# Patient Record
Sex: Female | Born: 1950 | Race: White | Hispanic: No | Marital: Married | Smoking: Former smoker
Health system: Southern US, Community
[De-identification: ages and names within clinical notes are randomized; demographics above are authoritative.]

## PROBLEM LIST (undated history)

## (undated) DIAGNOSIS — R202 Paresthesia of skin: Secondary | ICD-10-CM

## (undated) DIAGNOSIS — F32A Depression, unspecified: Secondary | ICD-10-CM

## (undated) DIAGNOSIS — H9190 Unspecified hearing loss, unspecified ear: Secondary | ICD-10-CM

## (undated) DIAGNOSIS — H9319 Tinnitus, unspecified ear: Secondary | ICD-10-CM

## (undated) DIAGNOSIS — H905 Unspecified sensorineural hearing loss: Secondary | ICD-10-CM

## (undated) DIAGNOSIS — E739 Lactose intolerance, unspecified: Secondary | ICD-10-CM

## (undated) DIAGNOSIS — H1010 Acute atopic conjunctivitis, unspecified eye: Secondary | ICD-10-CM

## (undated) DIAGNOSIS — R519 Headache, unspecified: Secondary | ICD-10-CM

## (undated) DIAGNOSIS — T7840XA Allergy, unspecified, initial encounter: Secondary | ICD-10-CM

## (undated) DIAGNOSIS — E039 Hypothyroidism, unspecified: Secondary | ICD-10-CM

## (undated) DIAGNOSIS — R7303 Prediabetes: Secondary | ICD-10-CM

## (undated) DIAGNOSIS — G43909 Migraine, unspecified, not intractable, without status migrainosus: Secondary | ICD-10-CM

## (undated) DIAGNOSIS — R51 Headache: Secondary | ICD-10-CM

## (undated) DIAGNOSIS — K219 Gastro-esophageal reflux disease without esophagitis: Secondary | ICD-10-CM

## (undated) DIAGNOSIS — C801 Malignant (primary) neoplasm, unspecified: Secondary | ICD-10-CM

## (undated) DIAGNOSIS — M8580 Other specified disorders of bone density and structure, unspecified site: Secondary | ICD-10-CM

## (undated) DIAGNOSIS — K760 Fatty (change of) liver, not elsewhere classified: Secondary | ICD-10-CM

## (undated) DIAGNOSIS — J309 Allergic rhinitis, unspecified: Secondary | ICD-10-CM

## (undated) DIAGNOSIS — N390 Urinary tract infection, site not specified: Secondary | ICD-10-CM

## (undated) DIAGNOSIS — M549 Dorsalgia, unspecified: Secondary | ICD-10-CM

## (undated) DIAGNOSIS — H2010 Chronic iridocyclitis, unspecified eye: Secondary | ICD-10-CM

## (undated) DIAGNOSIS — C19 Malignant neoplasm of rectosigmoid junction: Secondary | ICD-10-CM

## (undated) DIAGNOSIS — Z8719 Personal history of other diseases of the digestive system: Secondary | ICD-10-CM

## (undated) DIAGNOSIS — M1990 Unspecified osteoarthritis, unspecified site: Secondary | ICD-10-CM

## (undated) HISTORY — PX: ABDOMINAL HYSTERECTOMY: SHX81

## (undated) HISTORY — DX: Allergic rhinitis, unspecified: H10.10

## (undated) HISTORY — PX: ELBOW SURGERY: SHX618

## (undated) HISTORY — PX: GLAUCOMA SURGERY: SHX656

## (undated) HISTORY — DX: Dorsalgia, unspecified: M54.9

## (undated) HISTORY — PX: SPINE SURGERY: SHX786

## (undated) HISTORY — DX: Malignant neoplasm of rectosigmoid junction: C19

## (undated) HISTORY — PX: WRIST SURGERY: SHX841

## (undated) HISTORY — DX: Tinnitus, unspecified ear: H93.19

## (undated) HISTORY — DX: Headache: R51

## (undated) HISTORY — DX: Unspecified hearing loss, unspecified ear: H91.90

## (undated) HISTORY — DX: Allergy, unspecified, initial encounter: T78.40XA

## (undated) HISTORY — DX: Paresthesia of skin: R20.2

## (undated) HISTORY — PX: OTHER SURGICAL HISTORY: SHX169

## (undated) HISTORY — DX: Allergic rhinitis, unspecified: J30.9

## (undated) HISTORY — PX: HERNIA REPAIR: SHX51

## (undated) HISTORY — DX: Fatty (change of) liver, not elsewhere classified: K76.0

## (undated) HISTORY — DX: Lactose intolerance, unspecified: E73.9

## (undated) HISTORY — PX: LUNG REMOVAL, PARTIAL: SHX233

## (undated) HISTORY — PX: GASTRIC BYPASS: SHX52

## (undated) HISTORY — DX: Other specified disorders of bone density and structure, unspecified site: M85.80

## (undated) HISTORY — DX: Migraine, unspecified, not intractable, without status migrainosus: G43.909

## (undated) HISTORY — DX: Unspecified sensorineural hearing loss: H90.5

## (undated) HISTORY — PX: EYE SURGERY: SHX253

## (undated) HISTORY — DX: Headache, unspecified: R51.9

## (undated) HISTORY — PX: CHOLECYSTECTOMY: SHX55

## (undated) HISTORY — PX: DIAGNOSTIC LAPAROSCOPY: SUR761

## (undated) HISTORY — DX: Chronic iridocyclitis, unspecified eye: H20.10

## (undated) HISTORY — DX: Prediabetes: R73.03

## (undated) HISTORY — PX: CATARACT EXTRACTION: SUR2

## (undated) HISTORY — DX: Urinary tract infection, site not specified: N39.0

## (undated) HISTORY — DX: Gastro-esophageal reflux disease without esophagitis: K21.9

---
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
## 2013-12-11 ENCOUNTER — Ambulatory Visit (INDEPENDENT_AMBULATORY_CARE_PROVIDER_SITE_OTHER): Admitting: Neurology

## 2013-12-11 ENCOUNTER — Encounter: Payer: Self-pay | Admitting: Neurology

## 2013-12-11 VITALS — BP 143/69 | HR 61 | Ht 66.0 in | Wt 200.0 lb

## 2013-12-11 DIAGNOSIS — R32 Unspecified urinary incontinence: Secondary | ICD-10-CM

## 2013-12-11 DIAGNOSIS — R519 Headache, unspecified: Secondary | ICD-10-CM | POA: Insufficient documentation

## 2013-12-11 DIAGNOSIS — R51 Headache: Secondary | ICD-10-CM

## 2013-12-11 DIAGNOSIS — R200 Anesthesia of skin: Secondary | ICD-10-CM

## 2013-12-11 DIAGNOSIS — G43909 Migraine, unspecified, not intractable, without status migrainosus: Secondary | ICD-10-CM

## 2013-12-11 MED ORDER — RIZATRIPTAN BENZOATE 5 MG PO TBDP: 5.0000 mg | ORAL_TABLET | ORAL | Status: DC | PRN

## 2013-12-11 MED ORDER — TOPIRAMATE 100 MG PO TABS: 100.0000 mg | ORAL_TABLET | Freq: Two times a day (BID) | ORAL | Status: DC

## 2013-12-11 NOTE — Progress Notes (Signed)
PATIENT: Jordan Mahoney DOB: 06/06/1950  HISTORICAL  Jordan Mahoney is 63 years old right-handed Caucasian female, accompanied by her husband, referred to primary care physician Dr. Kieth Mahoney, and ENT Dr. Redmond Mahoney for evaluation of frequent headaches  She reported long history of headaches since teenager, it was typical migraine, caudal cranial severe pounding headaches, with associated light noise sensitivity, nauseous, can lasting few hours to days, over the years, she has been dealing with her migraine to variable degree, previously tried Imitrex, which does help her migraine, but causing palpitation  She reported increased migraine since 2014, she had about once every months severe prolonged typical migraine headaches, in between, she also has almost daily low-grade pressure headache behind her eyes, with associated light noise smell sensitivity.  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  She also had a history of gastric bypass in 2010, initially with 100 pound loss, now dealing with her frequent headaches, left elbow pain, recent surgery for tendinitis, she has regained 40 pounds,  She report 2 month history of urinary incontinence, since August 2015, she also noticed bilateral fingertips paresthesia, but denies significant neck pain, denies significant gait difficulty   Laboratory evaluation in June 2015 showed normal CBC, ferritin 28.2, LDL 88, normal iron level,  TSH 2.2 9, vitamin D 37.9, B12 570   REVIEW OF SYSTEMS: Full 14 system review of systems performed and notable only for urinary incontinence, eye pain, swelling in legs, hearing loss, ringing ears, easy bruising, allergies,  ALLERGIES: Allergies  Allergen Reactions  . Latex   . Valium [Diazepam]     HOME MEDICATIONS: No current outpatient prescriptions on file prior to visit.   No current facility-administered medications on file prior  to visit.    PAST MEDICAL HISTORY: Past Medical History  Diagnosis Date  . HA (headache)     PAST SURGICAL HISTORY: Past Surgical History  Procedure Laterality Date  . Cataract extraction Right   . C sections    . Belly button    . Wrist surgery Right   . Elbow surgery Left     FAMILY HISTORY: Family History  Problem Relation Age of Onset  . Diabetes Father     SOCIAL HISTORY:  History   Social History  . Marital Status: Unknown    Spouse Name: N/A    Number of Children: 2  . Years of Education: college   Occupational History  .      retired   Social History Main Topics  . Smoking status: Former Research scientist (life sciences)  . Smokeless tobacco: Never Used     Comment: Quit in 1985  . Alcohol Use: 0.6 oz/week    1 Cans of beer per week     Comment: Rare   . Drug Use: No  . Sexual Activity: Not on file   Other Topics Concern  . Not on file   Social History Narrative   Patient lives at home with her husband Jordan Mahoney).    Education two years of college.   Caffeine two glasses of tea.     PHYSICAL EXAM   Filed Vitals:   12/11/13 0836  BP: 143/69  Pulse: 61  Height: 5\' 6"  (1.676 m)  Weight: 200 lb (90.719 kg)    Not recorded    Body mass index is 32.3 kg/(m^2).   Generalized: In no acute distress  Neck: Supple, no carotid bruits   Cardiac: Regular rate rhythm  Pulmonary:  Clear to auscultation bilaterally  Musculoskeletal: No deformity  Neurological examination  Mentation: Alert oriented to time, place, history taking, and causual conversation  Cranial nerve II-XII: Right was enlarged reactive to light. Extraocular movements were full.  Visual field were full on confrontational test. Bilateral fundi were sharp.  Facial sensation and strength were normal. Hearing was intact to finger rubbing bilaterally. Uvula tongue midline.  Head turning and shoulder shrug and were normal and symmetric.Tongue protrusion into cheek strength was normal.  Motor: Normal tone,  bulk and strength, except limited range of motion of left elbow,  Sensory: Intact to fine touch, pinprick, preserved vibratory sensation, and proprioception at toes.  Coordination: Normal finger to nose, heel-to-shin bilaterally there was no truncal ataxia  Gait: Rising up from seated position without assistance, normal stance, without trunk ataxia, moderate stride, good arm swing, smooth turning, able to perform tiptoe, and heel walking , moderate difficulty with tandem walking   Romberg signs: Negative  Deep tendon reflexes: Brachioradialis 2/2, biceps 2/2, triceps 2/2, patellar  3/3 , Achilles 2/2, plantar responses were extensor bilaterally   DIAGNOSTIC DATA (LABS, IMAGING, TESTING) - I reviewed patient records, labs, notes, testing and imaging myself where available.   ASSESSMENT AND PLAN  Jordan Mahoney is a 63 y.o. female complains of  frequent headaches, long-standing history of migraines, she also reported two-month history of urinary urgency, incontinence, hyperreflexia of both upper and lower extremities, bilateral Babinski signs,  1, for her migraine headaches, will initiate preventive medications Topamax, titrating to 100 mg twice a day, 2. Maxalt as needed 3. Her history and examination suggestive of cervical spondylitic myelopathy, will proceed with MRI of cervical spine   Jordan Mahoney M.D. Ph.D.  [facility] [address], [place], [place] [address] [phone]

## 2013-12-21 ENCOUNTER — Ambulatory Visit
Admission: RE | Admit: 2013-12-21 | Discharge: 2013-12-21 | Disposition: A | Discharge status: Home / Self Care | Source: Ambulatory Visit | Attending: Neurology | Admitting: Neurology

## 2013-12-21 DIAGNOSIS — G43909 Migraine, unspecified, not intractable, without status migrainosus: Secondary | ICD-10-CM

## 2013-12-21 DIAGNOSIS — R202 Paresthesia of skin: Secondary | ICD-10-CM

## 2013-12-21 DIAGNOSIS — R32 Unspecified urinary incontinence: Secondary | ICD-10-CM

## 2013-12-21 DIAGNOSIS — R200 Anesthesia of skin: Secondary | ICD-10-CM

## 2013-12-26 ENCOUNTER — Ambulatory Visit (INDEPENDENT_AMBULATORY_CARE_PROVIDER_SITE_OTHER): Admitting: Neurology

## 2013-12-26 ENCOUNTER — Encounter: Payer: Self-pay | Admitting: Neurology

## 2013-12-26 VITALS — BP 120/83 | HR 68 | Ht 66.0 in | Wt 195.0 lb

## 2013-12-26 DIAGNOSIS — R200 Anesthesia of skin: Secondary | ICD-10-CM

## 2013-12-26 DIAGNOSIS — G43909 Migraine, unspecified, not intractable, without status migrainosus: Secondary | ICD-10-CM

## 2013-12-26 DIAGNOSIS — R32 Unspecified urinary incontinence: Secondary | ICD-10-CM

## 2013-12-26 NOTE — Progress Notes (Signed)
PATIENT: Jordan Mahoney DOB: 1950-07-27  HISTORICAL  Jordan Mahoney is 64 years old right-handed Caucasian female, accompanied by her husband, referred to primary care physician Dr. Kieth Brightly, and ENT Dr. Redmond Pulling for evaluation of frequent headaches  She reported long history of headaches since teenager, it was typical migraine, caudal cranial severe pounding headaches, with associated light noise sensitivity, nauseous, can lasting few hours to days, over the years, she has been dealing with her migraine to variable degree, previously tried Imitrex, which does help her migraine, but causing palpitation  She reported increased migraine since 2014, she had about once every months severe prolonged typical migraine headaches, in between, she also has almost daily low-grade pressure headache behind her eyes, with associated light noise smell sensitivity.  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  She also had a history of gastric bypass in 2010, initially with 100 pound loss, now dealing with her frequent headaches, left elbow pain, recent surgery for tendinitis, she has regained 40 pounds,  She report 2 month history of urinary incontinence, since August 2015, she also noticed bilateral fingertips paresthesia, but denies significant neck pain, denies significant gait difficulty   Laboratory evaluation in June 2015 showed normal CBC, ferritin 28.2, LDL 88, normal iron level,  TSH 2.2 9, vitamin D 37.9, B12 570  UPDATE Dec 26 2013: She has tired Maxalt 5 mg, which has been helfpful, but she also has to take second dosage because headaches comes back a few hours later, she also complains of fatigue, jaw stenosis after medications, she is on higher dose of Topamax 100 mg twice a day, complains of fingertips, toes numbness, fatigue,  Her headache overall has much improved, she has. In between, she is headache free, she  denies gait difficulty, no significant neck pain,  But she complains of acute onset urinary urgency, occasionally incontinence, Vesicare has been helpful, now is on generic oxybutynin, which is Lasix active  We have reviewed MRI of the cervical spine, which has demonstrated multilevel degenerative disc disease, most severe at C5-6, C6 and 7, with mild-to-moderate canal stenosis, there was no cord signal change  REVIEW OF SYSTEMS: Full 14 system review of systems performed and notable only for above ALLERGIES: Allergies  Allergen Reactions  . Latex   . Valium [Diazepam]     HOME MEDICATIONS: Current Outpatient Prescriptions on File Prior to Visit  Medication Sig Dispense Refill  . azelastine (ASTELIN) 0.1 % nasal spray Place 1 spray into both nostrils 2 (two) times daily. Use in each nostril as directed    . Biotin 2500 MCG CAPS Take by mouth daily.    . calcium carbonate (TUMS - DOSED IN MG ELEMENTAL CALCIUM) 500 MG chewable tablet Chew 1 tablet by mouth 3 (three) times daily.    . cetirizine (ZYRTEC) 10 MG tablet Take 10 mg by mouth daily.    [name] CRANBERRY PO Take by mouth 2 (two) times daily.    . Ergocalciferol (VITAMIN D2) 2000 UNITS TABS Take by mouth daily.    [name] esomeprazole (NEXIUM) 20 MG capsule Take 20 mg by mouth 2 (two) times daily before a meal.    . mometasone (NASONEX) 50 MCG/ACT nasal spray Place 2 sprays into the nose daily.    . Multiple Vitamin (MULTIVITAMIN) tablet Take 1 tablet by mouth daily.    [name] POTASSIUM CITRATE PO Take by mouth daily.    . rizatriptan (MAXALT-MLT) 5 MG disintegrating  tablet Take 1 tablet (5 mg total) by mouth as needed for migraine. May repeat in 2 hours if needed 15 tablet 11  . topiramate (TOPAMAX) 100 MG tablet Take 1 tablet (100 mg total) by mouth 2 (two) times daily. 60 tablet 11  . vitamin C (ASCORBIC ACID) 500 MG tablet Take 500 mg by mouth 2 (two) times daily.     No current facility-administered medications on file prior to visit.     PAST MEDICAL HISTORY: Past Medical History  Diagnosis Date  . HA (headache)     PAST SURGICAL HISTORY: Past Surgical History  Procedure Laterality Date  . Cataract extraction Right   . C sections    . Belly button    . Wrist surgery Right   . Elbow surgery Left     FAMILY HISTORY: Family History  Problem Relation Age of Onset  . Diabetes Father     SOCIAL HISTORY:  History   Social History  . Marital Status: Unknown    Spouse Name: N/A    Number of Children: 2  . Years of Education: college   Occupational History  .      retired   Social History Main Topics  . Smoking status: Former Research scientist (life sciences)  . Smokeless tobacco: Never Used     Comment: Quit in 1985  . Alcohol Use: 0.6 oz/week    1 Cans of beer per week     Comment: Rare   . Drug Use: No  . Sexual Activity: Not on file   Other Topics Concern  . Not on file   Social History Narrative   Patient lives at home with her husband Jordan Mahoney).    Education two years of college.   Caffeine two glasses of tea.     PHYSICAL EXAM   Filed Vitals:   12/26/13 0947  BP: 120/83  Pulse: 68  Height: 5\' 6"  (1.676 m)  Weight: 195 lb (88.451 kg)    Not recorded      Body mass index is 31.49 kg/(m^2).   Generalized: In no acute distress  Neck: Supple, no carotid bruits   Cardiac: Regular rate rhythm  Pulmonary: Clear to auscultation bilaterally  Musculoskeletal: No deformity  Neurological examination  Mentation: Alert oriented to time, place, history taking, and causual conversation  Cranial nerve II-XII: Right pupil was 1 mm enlarged in comparison to the left pupil, reactive to light. Extraocular movements were full.  Visual field were full on confrontational test. Bilateral fundi were sharp.  Facial sensation and strength were normal. Hearing was intact to finger rubbing bilaterally. Uvula tongue midline.  Head turning and shoulder shrug and were normal and symmetric.Tongue protrusion into cheek  strength was normal.  Motor: Normal tone, bulk and strength, except limited range of motion of left elbow,  Sensory: Intact to fine touch, pinprick, preserved vibratory sensation, and proprioception at toes.  Coordination: Normal finger to nose, heel-to-shin bilaterally there was no truncal ataxia  Gait: Rising up from seated position without assistance, normal stance, without trunk ataxia, moderate stride, good arm swing, smooth turning, able to perform tiptoe, and heel walking , moderate difficulty with tandem walking   Romberg signs: Negative  Deep tendon reflexes: Brachioradialis 2/2, biceps 2/2, triceps 2/2, patellar  3/3 , Achilles 2/2, plantar responses were flexor bilaterally   DIAGNOSTIC DATA (LABS, IMAGING, TESTING) - I reviewed patient records, labs, notes, testing and imaging myself where available.   ASSESSMENT AND PLAN  Hibo Blasdell is a 63 y.o. female complains  of  frequent headaches, long-standing history of migraines, she also reported two-month history of urinary urgency, incontinence, hyperreflexia of both upper and lower extremities, bilateral Babinski signs,  1,continue preventive medications Topamax  100 mg twice a day,may also consider magnesium oxide, 400 mg, riboflavin 100 mg twice a day 2. Maxalt as needed 3.  Cervical degenerative disc disease, mild to moderate canal stenosis, But there was no cord signal change,no gait difficulty, she does has urinary urgency, occasionally incontinence, has much improved with Vesicare/oxybutynin treatment, continue current management, 4.  return to clinic in 3 months,  Marcial Pacas M.D. Ph.D.  [facility] [address] [place], [place] [address] [phone]

## 2013-12-26 NOTE — Patient Instructions (Signed)
Magnesium oxide 400mg  Riboflavin 100mg  twice a day

## 2014-01-15 ENCOUNTER — Telehealth: Payer: Self-pay | Admitting: Neurology

## 2014-01-15 MED ORDER — RIZATRIPTAN BENZOATE 5 MG PO TBDP: 5.0000 mg | ORAL_TABLET | ORAL | Status: DC | PRN

## 2014-01-15 MED ORDER — TOPIRAMATE 100 MG PO TABS: 100.0000 mg | ORAL_TABLET | Freq: Two times a day (BID) | ORAL | Status: DC

## 2014-01-15 NOTE — Telephone Encounter (Signed)
Patient requesting Rx refills 90 day supply for  topiramate (TOPAMAX) 100 MG tablet and rizatriptan (MAXALT-MLT) 5 MG disintegrating tablet sent to [facility].  Please call and advise.

## 2014-01-15 NOTE — Telephone Encounter (Signed)
Rx's have been sent.  I called back, got no answer.  Left message.

## 2014-02-22 ENCOUNTER — Telehealth: Payer: Self-pay | Admitting: *Deleted

## 2014-02-22 NOTE — Telephone Encounter (Signed)
Records and MRI sent to Dr. Glenna Fellows.

## 2014-03-27 ENCOUNTER — Ambulatory Visit (INDEPENDENT_AMBULATORY_CARE_PROVIDER_SITE_OTHER): Admitting: Neurology

## 2014-03-27 ENCOUNTER — Encounter: Payer: Self-pay | Admitting: Neurology

## 2014-03-27 VITALS — BP 146/86 | HR 78 | Ht 66.0 in | Wt 188.0 lb

## 2014-03-27 DIAGNOSIS — G43909 Migraine, unspecified, not intractable, without status migrainosus: Secondary | ICD-10-CM | POA: Insufficient documentation

## 2014-03-27 MED ORDER — RIZATRIPTAN BENZOATE 10 MG PO TBDP: 10.0000 mg | ORAL_TABLET | ORAL | Status: DC | PRN

## 2014-03-27 MED ORDER — TOPIRAMATE 100 MG PO TABS: 100.0000 mg | ORAL_TABLET | Freq: Two times a day (BID) | ORAL | Status: DC

## 2014-03-27 NOTE — Progress Notes (Signed)
PATIENT: Jordan Mahoney DOB: [birth year]  HISTORICAL  Jordan Mahoney is 64 years old right-handed Caucasian female, accompanied by her husband, referred to primary care physician Dr. Kieth Brightly, and ENT Dr. Redmond Pulling for evaluation of frequent headaches  She reported long history of headaches since teenager, it was typical migraine, holocranial severe pounding headaches, with associated light noise sensitivity, nauseous, can last few hours to days, over the years, she has been dealing with her migraine to variable degree, previously tried Imitrex, which does help her migraine, but causing palpitation  She reported increased migraine since 2014, she had about once every month severe prolonged typical migraine headaches, in between, she also has almost daily low-grade pressure headache behind her eyes, with associated light noise smell sensitivity.  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  She also had a history of gastric bypass in 2010, initially with 100 pound loss, now dealing with her frequent headaches, left elbow pain, recent surgery for tendinitis, she has regained 40 pounds,  She report 2 month history of urinary incontinence, since August 2015, she also noticed bilateral fingertips paresthesia, but denies significant neck pain, denies significant gait difficulty  Laboratory evaluation in June 2015 showed normal CBC, ferritin 28.2, LDL 88, normal iron level,  TSH 2.2 9, vitamin D 37.9, B12 570  UPDATE Dec 26 2013: She has tired Maxalt 5 mg, which has been helfpful, but she also has to take second dosage because headaches comes back a few hours later, she also complains of fatigue, jaw tightness after medications, she is on higher dose of Topamax 100 mg twice a day, complains of fingertips, toes numbness, fatigue,  Her headache overall has much improved, she has. In between, she is headache free, she denies  gait difficulty, no significant neck pain,  But she complains of acute onset urinary urgency, occasionally incontinence, Vesicare has been helpful, now is on generic oxybutynin,   We have reviewed MRI of the cervical spine, which has demonstrated multilevel degenerative disc disease, most severe at C5-6, C6 and 7, with mild-to-moderate canal stenosis, there was no cord signal change  UPDATE Feb 10th 2016: She is now tolerating Topamax 100mg  bid, still has mild fingertips paresthesia, she is also taking magnesium, and riboflavin, her headache is under much better control, couple times each months, she had her typical migraine headaches, Maxalta 5mg  prn, was helpful, but often has to take second dosage, trigger for her migraines are weather change, strong smell, certain food, wines, processed food,  REVIEW OF SYSTEMS: Full 14 system review of systems performed and notable only for above ALLERGIES: Allergies  Allergen Reactions  . Latex   . Valium [Diazepam]     HOME MEDICATIONS: Current Outpatient Prescriptions on File Prior to Visit  Medication Sig Dispense Refill  . azelastine (ASTELIN) 0.1 % nasal spray Place 1 spray into both nostrils 2 (two) times daily. Use in each nostril as directed    . Biotin 2500 MCG CAPS Take by mouth daily.    . calcium carbonate (TUMS - DOSED IN MG ELEMENTAL CALCIUM) 500 MG chewable tablet Chew 1 tablet by mouth 3 (three) times daily.    . cetirizine (ZYRTEC) 10 MG tablet Take 10 mg by mouth daily.    [name] CRANBERRY PO Take by mouth 2 (two) times daily.    . Ergocalciferol (VITAMIN D2) 2000 UNITS TABS Take by mouth daily.    [name] esomeprazole (NEXIUM) 20 MG capsule Take  20 mg by mouth 2 (two) times daily before a meal.    . mometasone (NASONEX) 50 MCG/ACT nasal spray Place 2 sprays into the nose daily.    . Multiple Vitamin (MULTIVITAMIN) tablet Take 1 tablet by mouth daily.    [name] POTASSIUM CITRATE PO Take by mouth daily.    . rizatriptan (MAXALT-MLT) 5 MG  disintegrating tablet Take 1 tablet (5 mg total) by mouth as needed for migraine. May repeat in 2 hours if needed 36 tablet 3  . topiramate (TOPAMAX) 100 MG tablet Take 1 tablet (100 mg total) by mouth 2 (two) times daily. 180 tablet 3  . vitamin C (ASCORBIC ACID) 500 MG tablet Take 500 mg by mouth 2 (two) times daily.     No current facility-administered medications on file prior to visit.    PAST MEDICAL HISTORY: Past Medical History  Diagnosis Date  . HA (headache)     PAST SURGICAL HISTORY: Past Surgical History  Procedure Laterality Date  . Cataract extraction Right   . C sections    . Belly button    . Wrist surgery Right   . Elbow surgery Left     FAMILY HISTORY: Family History  Problem Relation Age of Onset  . Diabetes Father     SOCIAL HISTORY:  History   Social History  . Marital Status: Unknown    Spouse Name: N/A    Number of Children: 2  . Years of Education: college   Occupational History  .      retired   Social History Main Topics  . Smoking status: Former Research scientist (life sciences)  . Smokeless tobacco: Never Used     Comment: Quit in 1985  . Alcohol Use: 0.6 oz/week    1 Cans of beer per week     Comment: Rare   . Drug Use: No  . Sexual Activity: Not on file   Other Topics Concern  . Not on file   Social History Narrative   Patient lives at home with her husband [name]).    Education two years of college.   Caffeine two glasses of tea.     PHYSICAL EXAM   Filed Vitals:   03/27/14 1043  BP: 146/86  Pulse: 78  Height: 5\' 6"  (1.676 m)  Weight: 188 lb (85.276 kg)    Not recorded      Body mass index is 30.36 kg/(m^2).   Generalized: In no acute distress  Neck: Supple, no carotid bruits   Cardiac: Regular rate rhythm  Pulmonary: Clear to auscultation bilaterally  Musculoskeletal: No deformity  Neurological examination  Mentation: Alert oriented to time, place, history taking, and causual conversation  Cranial nerve II-XII: Right  pupil was 1 mm enlarged in comparison to the left pupil, reactive to light. Extraocular movements were full.  Visual field were full on confrontational test. Bilateral fundi were sharp.  Facial sensation and strength were normal. Hearing was intact to finger rubbing bilaterally. Uvula tongue midline.  Head turning and shoulder shrug and were normal and symmetric.Tongue protrusion into cheek strength was normal.  Motor: Normal tone, bulk and strength, except limited range of motion of left elbow,  Sensory: Intact to fine touch, pinprick, preserved vibratory sensation, and proprioception at toes.  Coordination: Normal finger to nose, heel-to-shin bilaterally there was no truncal ataxia  Gait: Rising up from seated position without assistance, normal stance, without trunk ataxia, moderate stride, good arm swing, smooth turning, able to perform tiptoe, and heel walking , moderate difficulty with  tandem walking   Romberg signs: Negative  Deep tendon reflexes: Brachioradialis 2/2, biceps 2/2, triceps 2/2, patellar  3/3 , Achilles 2/2, plantar responses were flexor bilaterally   DIAGNOSTIC DATA (LABS, IMAGING, TESTING) - I reviewed patient records, labs, notes, testing and imaging myself where available.   ASSESSMENT AND PLAN  Shardae Kleinman is a 64 y.o. female complains of  frequent headaches, long-standing history of migraines, she also reported two-month history of urinary urgency, incontinence, hyperreflexia of both upper and lower extremities, bilateral Babinski signs,  1,continue preventive medications Topamax  100 mg twice a day, and  magnesium oxide, 400 mg, riboflavin 100 mg twice a day 2. Maxalt 10 mg as needed 3.  Cervical degenerative disc disease, mild to moderate canal stenosis, But there was no cord signal change,no gait difficulty, she does has urinary urgency, occasionally incontinence, has much improved with Vesicare/oxybutynin treatment, continue current management, 4.  return  to clinic in 6 months, with Rhae Hammock M.D. Ph.D.  [facility] [address] [place], [place] [address] [phone]

## 2014-09-25 ENCOUNTER — Encounter: Payer: Self-pay | Admitting: Nurse Practitioner

## 2014-09-25 ENCOUNTER — Ambulatory Visit (INDEPENDENT_AMBULATORY_CARE_PROVIDER_SITE_OTHER): Admitting: Nurse Practitioner

## 2014-09-25 VITALS — BP 123/66 | HR 61 | Ht 66.0 in | Wt 183.0 lb

## 2014-09-25 DIAGNOSIS — G43909 Migraine, unspecified, not intractable, without status migrainosus: Secondary | ICD-10-CM

## 2014-09-25 NOTE — Progress Notes (Signed)
[facility]  PATIENT: Jordan Mahoney DOB: 1950/08/15   REASON FOR VISIT: Follow-up for severe migraine HISTORY FROM: Patient and husband    HISTORY OF PRESENT ILLNESS:Jordan Mahoney is 64 years old right-handed Caucasian female, accompanied by her husband, referred to primary care physician Dr. Kieth Brightly, and ENT Dr. Redmond Pulling for evaluation of frequent headaches She reported long history of headaches since teenager, it was typical migraine, holocranial severe pounding headaches, with associated light noise sensitivity, nauseous, can last few hours to days, over the years, she has been dealing with her migraine to variable degree, previously tried Imitrex, which does help her migraine, but causing palpitation She reported increased migraine since 2014, she had about once every month severe prolonged typical migraine headaches, in between, she also has almost daily low-grade pressure headache behind her eyes, with associated light noise smell sensitivity. 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 She also had a history of gastric bypass in 2010, initially with 100 pound loss, now dealing with her frequent headaches, left elbow pain, recent surgery for tendinitis, she has regained 40 pounds, She report 2 month history of urinary incontinence, since August 2015, she also noticed bilateral fingertips paresthesia, but denies significant neck pain, denies significant gait difficulty Laboratory evaluation in June 2015 showed normal CBC, ferritin 28.2, LDL 88, normal iron level, TSH 2.2 9, vitamin D 37.9, B12 570  UPDATE Dec 26 2013: She has tired Maxalt 5 mg, which has been helfpful, but she also has to take second dosage because headaches comes back a few hours later, she also complains of fatigue, jaw tightness after medications, she is on higher dose of Topamax 100 mg twice a day, complains of fingertips,  toes numbness, fatigue, Her headache overall has much improved, she has. In between, she is headache free, she denies gait difficulty, no significant neck pain, But she complains of acute onset urinary urgency, occasionally incontinence, Vesicare has been helpful, now is on generic oxybutynin,  We have reviewed MRI of the cervical spine, which has demonstrated multilevel degenerative disc disease, most severe at C5-6, C6 and 7, with mild-to-moderate canal stenosis, there was no cord signal change  UPDATE Feb 10th 2016: She is now tolerating Topamax '100mg'$  bid, still has mild fingertips paresthesia, she is also taking magnesium, and riboflavin, her headache are  under much better control, couple times each months, she had her typical migraine headaches, Maxalta '5mg'$  prn, was helpful, but often has to take second dosage, trigger for her migraines are weather change, strong smell, certain food, wines, processed food,  UPDATE 09/25/2014: Jordan Mahoney, 64 year old female returns for follow-up. She has a history of migraines and her migraines have worsened in the last couple of weeks due to changes in the weather she claims. Her headache today is a 10 on the pain scale of 1-10. She is nauseated. She has photophobia. She remains on her Topamax as preventive. She did not take her Maxalt this morning. She returns for reevaluation and to get some relief    REVIEW OF SYSTEMS: Full 14 system review of systems performed and notable only for those listed, all others are neg:  Constitutional: neg  Cardiovascular: neg Ear/Nose/Throat: neg  Skin: neg Eyes: neg Respiratory: neg Gastroitestinal: neg  Hematology/Lymphatic: neg  Endocrine: neg Musculoskeletal:neg Allergy/Immunology: neg Neurological: Migraine today, dizziness Psychiatric: neg Sleep : neg   ALLERGIES: Allergies  Allergen Reactions  . Latex   . Sulfa Antibiotics Other (  See Comments)    Causes skin reaction ( on her mid back)  Pigmentation.     . Valium [Diazepam]     HOME MEDICATIONS: Outpatient Prescriptions Prior to Visit  Medication Sig Dispense Refill  . azelastine (ASTELIN) 0.1 % nasal spray Place 1 spray into both nostrils daily. Use in each nostril as directed    . Biotin 2500 MCG CAPS Take by mouth daily.    . calcium carbonate (TUMS - DOSED IN MG ELEMENTAL CALCIUM) 500 MG chewable tablet Chew 1 tablet by mouth 3 (three) times daily.    . cetirizine (ZYRTEC) 10 MG tablet Take 10 mg by mouth daily.    . Ergocalciferol (VITAMIN D2) 2000 UNITS TABS Take by mouth daily.    [name] esomeprazole (NEXIUM) 20 MG capsule Take 20 mg by mouth 2 (two) times daily before a meal.    . magnesium oxide (MAG-OX) 400 MG tablet Take 400 mg by mouth daily.    . mometasone (NASONEX) 50 MCG/ACT nasal spray Place 2 sprays into the nose daily.    . Multiple Vitamin (MULTIVITAMIN) tablet Take 1 tablet by mouth daily.    [name] POTASSIUM CITRATE PO Take by mouth daily.    . Riboflavin 100 MG CAPS Take 100 mg by mouth 2 (two) times daily.    . rizatriptan (MAXALT-MLT) 10 MG disintegrating tablet Take 1 tablet (10 mg total) by mouth as needed for migraine. May repeat in 2 hours if needed 12 tablet 11  . topiramate (TOPAMAX) 100 MG tablet Take 1 tablet (100 mg total) by mouth 2 (two) times daily. 180 tablet 3  . vitamin C (ASCORBIC ACID) 500 MG tablet Take 500 mg by mouth 2 (two) times daily.    [name] CRANBERRY PO Take by mouth 2 (two) times daily.     No facility-administered medications prior to visit.    PAST MEDICAL HISTORY: Past Medical History  Diagnosis Date  . HA (headache)     PAST SURGICAL HISTORY: Past Surgical History  Procedure Laterality Date  . Cataract extraction Right   . C sections    . Belly button    . Wrist surgery Right   . Elbow surgery Left     FAMILY HISTORY: Family History  Problem Relation Age of Onset  . Diabetes Father     SOCIAL HISTORY: Social History   Social History  . Marital Status: Unknown    Spouse  Name: N/A  . Number of Children: 2  . Years of Education: college   Occupational History  .      retired   Social History Main Topics  . Smoking status: Former Research scientist (life sciences)  . Smokeless tobacco: Never Used     Comment: Quit in 1985  . Alcohol Use: 0.6 oz/week    1 Cans of beer per week     Comment: Rare   . Drug Use: No  . Sexual Activity: Not on file   Other Topics Concern  . Not on file   Social History Narrative   Patient lives at home with her husband [name]).    Education two years of college.   Caffeine two glasses of tea.     PHYSICAL EXAM  Filed Vitals:   09/25/14 0945  BP: 123/66  Pulse: 61  Height: '5\' 6"'$  (1.676 m)  Weight: 183 lb (83.008 kg)   Body mass index is 29.55 kg/(m^2). Generalized: In no acute distress Neck: Supple, no carotid bruits  Cardiac: Regular rate rhythm Musculoskeletal: No deformity  Neurological examination Mentation: Alert oriented to time, place, history taking, and causual conversation  Cranial nerve II-XII: Fundiscopic deferred due to extreme migraine today.  Extraocular movements were full. Visual field were full on confrontational test.  Facial sensation and strength were normal. Hearing was intact to finger rubbing bilaterally. Uvula tongue midline. Head turning and shoulder shrug and were normal and symmetric.Tongue protrusion into cheek strength was normal. Motor: Normal tone, bulk and strength, except limited range of motion of left elbow, Sensory: Intact to fine touch, pinprick, preserved vibratory sensation, and proprioception at toes. Coordination: Normal finger to nose, heel-to-shin bilaterally there was no truncal ataxia Gait: Deferred due to migraine  Deep tendon reflexes: Brachioradialis 2/2, biceps 2/2, triceps 2/2, patellar 3/3 , Achilles 2/2, plantar responses were flexor bilaterally  DIAGNOSTIC DATA (LABS, IMAGING, TESTING) - ASSESSMENT AND PLAN  64 y.o. year old female  has a past medical history  of frequent  headaches, long-standing history of migraines, she also reported 29-monthhistory of urinary urgency, incontinence, hyperreflexia of both upper and lower extremities, bilateral Babinski signs,here  to follow up.   Discussed with Dr YKrista BlueFor her headache today will get migraine cocktail of IV Depacon 1 gram, Solu-Medrol '500mg'$   Compazine '10mg'$   and Toradol '30mg'$   For prevention continue Topamax at current doses does not need refills Patient to check with insurance for alternative triptan currently using Maxalt Keep a record of headaches  And bring to next appointment in 3 months NDennie Bible [facility] APRN  Late entry headache down to a 1 from a 10 after migraine cocktail.  [facility] [address] [place] [phone]

## 2014-09-25 NOTE — Patient Instructions (Addendum)
For her headache today will get migraine cocktail of Depacon Solu-Medrol Compazine and Toradol For prevention continue Topamax at current doses does not need refills Patient to check with insurance for alternative triptan currently using Maxalt  Keep a record of headaches  And bring to next appointment in 3 months

## 2014-09-25 NOTE — Progress Notes (Signed)
I have reviewed and agreed above plan. 

## 2014-09-27 ENCOUNTER — Telehealth: Payer: Self-pay | Admitting: Nurse Practitioner

## 2014-09-27 NOTE — Telephone Encounter (Signed)
Patient's husband is calling regarding alternative Triptan that the insurance will cover. He states Zolmitriptan and Relpax will be covered by the patient's insurance. Please call Rx to [facility]. Thank you.

## 2014-09-27 NOTE — Telephone Encounter (Signed)
Patient would like to change Triptans from Maxalt to either Zolmitriptan or Relpax for ins reason.  Dr Orion Crook are both out of the office.  Forwarding request to [facility] for review.

## 2014-09-29 MED ORDER — ELETRIPTAN HYDROBROMIDE 40 MG PO TABS: 40.0000 mg | ORAL_TABLET | ORAL | Status: DC | PRN

## 2014-09-29 NOTE — Telephone Encounter (Signed)
I will call in a prescription for the Relpax.

## 2014-11-07 DIAGNOSIS — J309 Allergic rhinitis, unspecified: Secondary | ICD-10-CM | POA: Insufficient documentation

## 2014-11-19 ENCOUNTER — Ambulatory Visit (INDEPENDENT_AMBULATORY_CARE_PROVIDER_SITE_OTHER)

## 2014-11-19 DIAGNOSIS — J301 Allergic rhinitis due to pollen: Secondary | ICD-10-CM | POA: Diagnosis not present

## 2014-11-26 ENCOUNTER — Encounter

## 2014-11-26 ENCOUNTER — Ambulatory Visit (INDEPENDENT_AMBULATORY_CARE_PROVIDER_SITE_OTHER)

## 2014-11-26 DIAGNOSIS — J309 Allergic rhinitis, unspecified: Secondary | ICD-10-CM | POA: Diagnosis not present

## 2014-11-26 NOTE — Progress Notes (Signed)
  This encounter was created in error - please disregard. Signed in under [place], patient seen in [place].

## 2014-12-03 ENCOUNTER — Ambulatory Visit (INDEPENDENT_AMBULATORY_CARE_PROVIDER_SITE_OTHER)

## 2014-12-03 DIAGNOSIS — J301 Allergic rhinitis due to pollen: Secondary | ICD-10-CM | POA: Diagnosis not present

## 2014-12-10 ENCOUNTER — Ambulatory Visit (INDEPENDENT_AMBULATORY_CARE_PROVIDER_SITE_OTHER)

## 2014-12-10 DIAGNOSIS — J301 Allergic rhinitis due to pollen: Secondary | ICD-10-CM | POA: Diagnosis not present

## 2014-12-17 ENCOUNTER — Ambulatory Visit (INDEPENDENT_AMBULATORY_CARE_PROVIDER_SITE_OTHER)

## 2014-12-17 DIAGNOSIS — J301 Allergic rhinitis due to pollen: Secondary | ICD-10-CM | POA: Diagnosis not present

## 2014-12-24 ENCOUNTER — Ambulatory Visit (INDEPENDENT_AMBULATORY_CARE_PROVIDER_SITE_OTHER)

## 2014-12-24 DIAGNOSIS — J301 Allergic rhinitis due to pollen: Secondary | ICD-10-CM | POA: Diagnosis not present

## 2014-12-26 ENCOUNTER — Ambulatory Visit (INDEPENDENT_AMBULATORY_CARE_PROVIDER_SITE_OTHER): Admitting: Nurse Practitioner

## 2014-12-26 ENCOUNTER — Encounter: Payer: Self-pay | Admitting: Nurse Practitioner

## 2014-12-26 VITALS — BP 136/80 | HR 61 | Ht 66.0 in | Wt 181.2 lb

## 2014-12-26 DIAGNOSIS — R51 Headache: Secondary | ICD-10-CM | POA: Diagnosis not present

## 2014-12-26 DIAGNOSIS — G43909 Migraine, unspecified, not intractable, without status migrainosus: Secondary | ICD-10-CM | POA: Diagnosis not present

## 2014-12-26 DIAGNOSIS — R519 Headache, unspecified: Secondary | ICD-10-CM

## 2014-12-26 NOTE — Patient Instructions (Addendum)
Continue Relpax acutely does not need refills Continue Topamax 100 mg twice daily Continue to  avoid migraine triggers Follow-up in 6-8 months

## 2014-12-26 NOTE — Progress Notes (Signed)
[facility]  PATIENT: Jordan Mahoney DOB: 02-17-1950   REASON FOR VISIT: Follow-up for migraines HISTORY FROM: Patient    HISTORY OF PRESENT ILLNESS:Jordan Mahoney is 64 years old right-handed Caucasian female, accompanied by her husband, referred to primary care physician Dr. Kieth Brightly, and ENT Dr. Redmond Pulling for evaluation of frequent headaches She reported long history of headaches since teenager, it was typical migraine, holocranial severe pounding headaches, with associated light noise sensitivity, nauseous, can last few hours to days, over the years, she has been dealing with her migraine to variable degree, previously tried Imitrex, which does help her migraine, but causing palpitation She reported increased migraine since 2014, she had about once every month severe prolonged typical migraine headaches, in between, she also has almost daily low-grade pressure headache behind her eyes, with associated light noise smell sensitivity. 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 She also had a history of gastric bypass in 2010, initially with 100 pound loss, now dealing with her frequent headaches, left elbow pain, recent surgery for tendinitis, she has regained 40 pounds, She report 2 month history of urinary incontinence, since August 2015, she also noticed bilateral fingertips paresthesia, but denies significant neck pain, denies significant gait difficulty Laboratory evaluation in June 2015 showed normal CBC, ferritin 28.2, LDL 88, normal iron level, TSH 2.2 9, vitamin D 37.9, B12 570  UPDATE Dec 26 2013: She has tired Maxalt 5 mg, which has been helfpful, but she also has to take second dosage because headaches comes back a few hours later, she also complains of fatigue, jaw tightness after medications, she is on higher dose of Topamax 100 mg twice a day, complains of fingertips, toes numbness,  fatigue, Her headache overall has much improved, she has. In between, she is headache free, she denies gait difficulty, no significant neck pain, But she complains of acute onset urinary urgency, occasionally incontinence, Vesicare has been helpful, now is on generic oxybutynin,  We have reviewed MRI of the cervical spine, which has demonstrated multilevel degenerative disc disease, most severe at C5-6, C6 and 7, with mild-to-moderate canal stenosis, there was no cord signal change  UPDATE Feb 10th 2016: She is now tolerating Topamax '100mg'$  bid, still has mild fingertips paresthesia, she is also taking magnesium, and riboflavin, her headache are under much better control, couple times each months, she had her typical migraine headaches, Maxalta '5mg'$  prn, was helpful, but often has to take second dosage, trigger for her migraines are weather change, strong smell, certain food, wines, processed food,  UPDATE 09/25/2014: [name], 64 year old female returns for follow-up. She has a history of migraines and her migraines have worsened in the last couple of weeks due to changes in the weather she claims. Her headache today is a 10 on the pain scale of 1-10. She is nauseated. She has photophobia. She remains on her Topamax as preventive. She did not take her Maxalt this morning. She returns for reevaluation UPDATE 12/26/2014 [name], 64year-old female returns for follow-up. She has a history of migraine headaches. She is currently on Topamax 100 twice daily and her triptan has been changed to Relpax,  works acutely for her. Her headaches are in good control. Her biggest triggers are weather changes, certain foods and strong perfumes. She tries to avoid these if at all possible. She returns for reevaluation    REVIEW OF SYSTEMS: Full 14 system review of systems performed and notable only  for those listed, all others are neg:  Constitutional: neg  Cardiovascular: neg Ear/Nose/Throat: neg  Skin:  neg Eyes: neg Respiratory: neg Gastroitestinal: neg  Hematology/Lymphatic: neg  Endocrine: neg Musculoskeletal:neg Allergy/Immunology: neg Neurological: neg Psychiatric: neg Sleep : neg   ALLERGIES: Allergies  Allergen Reactions  . Latex   . Sulfa Antibiotics Other (See Comments)    Causes skin reaction ( on her mid back)  Pigmentation.   . Valium [Diazepam]     HOME MEDICATIONS: Outpatient Prescriptions Prior to Visit  Medication Sig Dispense Refill  . azelastine (ASTELIN) 0.1 % nasal spray Place 1 spray into both nostrils daily. Use in each nostril as directed    . azelastine (OPTIVAR) 0.05 % ophthalmic solution 1 drop 2 (two) times daily.    . Biotin 2500 MCG CAPS Take by mouth daily.    . calcium carbonate (TUMS - DOSED IN MG ELEMENTAL CALCIUM) 500 MG chewable tablet Chew 1 tablet by mouth 3 (three) times daily.    [name] CALCIUM CITRATE PO Take 500 mg by mouth 3 (three) times daily.    . cetirizine (ZYRTEC) 10 MG tablet Take 10 mg by mouth daily.    [name] CRANBERRY PO Take 8,400 mg by mouth 2 (two) times daily.    [name] eletriptan (RELPAX) 40 MG tablet Take 1 tablet (40 mg total) by mouth as needed for migraine or headache. May repeat in 2 hours if headache persists or recurs. 27 tablet 1  . EPINEPHrine (EPIPEN 2-PAK) 0.3 mg/0.3 mL IJ SOAJ injection Inject 0.3 mg into the muscle as needed (for severe life-threatening allergic reaction).    . Ergocalciferol (VITAMIN D2) 2000 UNITS TABS Take by mouth daily.    [name] esomeprazole (NEXIUM) 20 MG capsule Take 20 mg by mouth 2 (two) times daily before a meal.    . Magnesium 400 MG CAPS Take 400 mg by mouth daily.    . metroNIDAZOLE (METROGEL) 0.75 % vaginal gel Place 1 Applicatorful vaginally daily.    . mometasone (NASONEX) 50 MCG/ACT nasal spray Place 2 sprays into the nose 2 (two) times daily.    . Multiple Vitamin (MULTIVITAMIN) tablet Take 1 tablet by mouth daily.    . nitrofurantoin (MACRODANTIN) 50 MG capsule Take 50 mg by mouth. Takes  on Monday, Wednesday, and Friday.    [name] oxyCODONE-acetaminophen (PERCOCET/ROXICET) 5-325 MG per tablet     . Riboflavin 100 MG CAPS Take 100 mg by mouth 2 (two) times daily.    . solifenacin (VESICARE) 10 MG tablet Take 10 mg by mouth daily.    [name] topiramate (TOPAMAX) 100 MG tablet Take 1 tablet (100 mg total) by mouth 2 (two) times daily. 180 tablet 3  . vitamin C (ASCORBIC ACID) 500 MG tablet Take 500 mg by mouth 2 (two) times daily.    [name] ZOSTAVAX 80998 UNT/0.65ML injection     . Riboflavin (VITAMIN B-2 PO) Take 100 mg by mouth daily.    . rizatriptan (MAXALT) 10 MG tablet Take 10 mg by mouth as needed for migraine. May repeat in 2 hours if needed    . VESICARE 10 MG tablet     . azelastine (ASTELIN) 0.1 % nasal spray Place 2 sprays into both nostrils 2 (two) times daily. Use in each nostril as directed    . magnesium oxide (MAG-OX) 400 MG tablet Take 400 mg by mouth daily.    . mometasone (NASONEX) 50 MCG/ACT nasal spray Place 2 sprays into the nose daily.    . nitrofurantoin (MACRODANTIN)  50 MG capsule      No facility-administered medications prior to visit.    PAST MEDICAL HISTORY: Past Medical History  Diagnosis Date  . HA (headache)     PAST SURGICAL HISTORY: Past Surgical History  Procedure Laterality Date  . Cataract extraction Right   . C sections    . Belly button    . Wrist surgery Right   . Elbow surgery Left     FAMILY HISTORY: Family History  Problem Relation Age of Onset  . Diabetes Father     SOCIAL HISTORY: Social History   Social History  . Marital Status: Unknown    Spouse Name: N/A  . Number of Children: 2  . Years of Education: college   Occupational History  .      retired   Social History Main Topics  . Smoking status: Former Research scientist (life sciences)  . Smokeless tobacco: Never Used     Comment: Quit in 1985  . Alcohol Use: 0.6 oz/week    1 Cans of beer per week     Comment: Rare   . Drug Use: No  . Sexual Activity: Not on file   Other Topics  Concern  . Not on file   Social History Narrative   Patient lives at home with her husband [name]).    Education two years of college.   Caffeine two glasses of tea.     PHYSICAL EXAM  Filed Vitals:   12/26/14 0922  BP: 136/80  Pulse: 61  Height: '5\' 6"'$  (1.676 m)  Weight: 181 lb 3.2 oz (82.192 kg)   Body mass index is 29.26 kg/(m^2). Generalized: In no acute distress Neck: Supple, no carotid bruits  Cardiac: Regular rate rhythm Musculoskeletal: No deformity  Neurological examination Mentation: Alert oriented to time, place, history taking, and causual conversation  Cranial nerve II-XII: PERL. Extraocular movements were full. Visual field were full on confrontational test.  Facial sensation and strength were normal. Hearing was intact to finger rubbing bilaterally. Uvula tongue midline. Head turning and shoulder shrug and were normal and symmetric.Tongue protrusion into cheek strength was normal. Motor: Normal tone, bulk and strength, except limited range of motion of left elbow, Sensory: Intact to fine touch, pinprick, preserved vibratory sensation, and proprioception at toes. Coordination: Normal finger to nose, heel-to-shin bilaterally there was no truncal ataxia Gait: Ambulated short distance, can heel toe and tandem without difficulty Deep tendon reflexes: Brachioradialis 2/2, biceps 2/2, triceps 2/2, patellar 3/3 , Achilles 2/2, plantar responses were flexor bilaterally   DIAGNOSTIC DATA (LABS, IMAGING, TESTING) -  ASSESSMENT AND PLAN  64 y.o. year old female  has a past medical history of HA (headache). here to follow-up.  PLANContinue Relpax acutely does not need refills Continue Topamax 100 mg twice daily Continue to avoid migraine triggers Follow-up in 6-8 months Dennie Bible, [facility], [facility], APRN  [facility] [address] [place], [place] [address] [phone]

## 2014-12-26 NOTE — Progress Notes (Signed)
I have reviewed and agreed above plan. 

## 2014-12-31 ENCOUNTER — Ambulatory Visit (INDEPENDENT_AMBULATORY_CARE_PROVIDER_SITE_OTHER)

## 2014-12-31 DIAGNOSIS — J301 Allergic rhinitis due to pollen: Secondary | ICD-10-CM | POA: Diagnosis not present

## 2015-01-06 ENCOUNTER — Telehealth: Payer: Self-pay | Admitting: Nurse Practitioner

## 2015-01-06 MED ORDER — ELETRIPTAN HYDROBROMIDE 40 MG PO TABS: 40.0000 mg | ORAL_TABLET | ORAL | Status: DC | PRN

## 2015-01-06 NOTE — Telephone Encounter (Signed)
Spoke to pt.  She has been taking relpax 1-2 tabs daily for migraines.    I told her that the topamax is daily preventative medication for her migraines.  The relpax is to use acutely (when has migraine).  This as per instructed when in last 12-26-14.  She states that her migraines are triggered by bariatric weather changes, chocolate, flashing lights.  I told her that insurance dictates how much of this medication we can prescribe at a time (per month).  She got this via mail order (#27 tabs).  I told her that this medication is not to be used with other triptans.  I would forward message to CM/NP.  Call her cell#.   She only had 4 tabs left.    (not sure when get this last prescription).

## 2015-01-06 NOTE — Telephone Encounter (Signed)
Patient called to request refill of eletriptan (RELPAX) 40 MG tablet, states she is taking this medication most every day and wonders if medication quantity can be increased.

## 2015-01-06 NOTE — Telephone Encounter (Signed)
She needs refills on Relpax last refilled in August. Will refill.

## 2015-01-07 ENCOUNTER — Ambulatory Visit (INDEPENDENT_AMBULATORY_CARE_PROVIDER_SITE_OTHER)

## 2015-01-07 DIAGNOSIS — J309 Allergic rhinitis, unspecified: Secondary | ICD-10-CM | POA: Diagnosis not present

## 2015-01-14 ENCOUNTER — Ambulatory Visit (INDEPENDENT_AMBULATORY_CARE_PROVIDER_SITE_OTHER)

## 2015-01-14 DIAGNOSIS — J309 Allergic rhinitis, unspecified: Secondary | ICD-10-CM

## 2015-01-21 ENCOUNTER — Other Ambulatory Visit: Payer: Self-pay

## 2015-01-21 ENCOUNTER — Ambulatory Visit (INDEPENDENT_AMBULATORY_CARE_PROVIDER_SITE_OTHER)

## 2015-01-21 DIAGNOSIS — J309 Allergic rhinitis, unspecified: Secondary | ICD-10-CM

## 2015-01-21 MED ORDER — CETIRIZINE HCL 10 MG PO TABS: ORAL_TABLET | ORAL | Status: DC

## 2015-01-27 ENCOUNTER — Other Ambulatory Visit: Payer: Self-pay | Admitting: Allergy and Immunology

## 2015-01-28 ENCOUNTER — Ambulatory Visit (INDEPENDENT_AMBULATORY_CARE_PROVIDER_SITE_OTHER)

## 2015-01-28 DIAGNOSIS — J309 Allergic rhinitis, unspecified: Secondary | ICD-10-CM

## 2015-02-04 ENCOUNTER — Ambulatory Visit (INDEPENDENT_AMBULATORY_CARE_PROVIDER_SITE_OTHER)

## 2015-02-04 DIAGNOSIS — J309 Allergic rhinitis, unspecified: Secondary | ICD-10-CM

## 2015-02-11 ENCOUNTER — Ambulatory Visit (INDEPENDENT_AMBULATORY_CARE_PROVIDER_SITE_OTHER)

## 2015-02-11 DIAGNOSIS — J309 Allergic rhinitis, unspecified: Secondary | ICD-10-CM | POA: Diagnosis not present

## 2015-02-18 ENCOUNTER — Ambulatory Visit (INDEPENDENT_AMBULATORY_CARE_PROVIDER_SITE_OTHER): Admitting: Neurology

## 2015-02-18 DIAGNOSIS — J309 Allergic rhinitis, unspecified: Secondary | ICD-10-CM | POA: Diagnosis not present

## 2015-02-25 ENCOUNTER — Ambulatory Visit (INDEPENDENT_AMBULATORY_CARE_PROVIDER_SITE_OTHER): Admitting: Neurology

## 2015-02-25 DIAGNOSIS — J309 Allergic rhinitis, unspecified: Secondary | ICD-10-CM

## 2015-03-04 ENCOUNTER — Encounter: Payer: Self-pay | Admitting: Allergy and Immunology

## 2015-03-04 ENCOUNTER — Ambulatory Visit (INDEPENDENT_AMBULATORY_CARE_PROVIDER_SITE_OTHER): Admitting: Allergy and Immunology

## 2015-03-04 VITALS — BP 126/78 | HR 64 | Temp 98.2°F | Resp 20

## 2015-03-04 DIAGNOSIS — R05 Cough: Secondary | ICD-10-CM

## 2015-03-04 DIAGNOSIS — R059 Cough, unspecified: Secondary | ICD-10-CM

## 2015-03-04 DIAGNOSIS — J309 Allergic rhinitis, unspecified: Secondary | ICD-10-CM

## 2015-03-04 DIAGNOSIS — H1010 Acute atopic conjunctivitis, unspecified eye: Secondary | ICD-10-CM

## 2015-03-04 NOTE — Progress Notes (Signed)
FOLLOW UP NOTE  RE: Kathee Tumlin MRN: [id number] DOB: [birth year] [facility] OF [place]. [address] [place], [place] [address] Date of Office Visit: 03/04/2015  Subjective:  Nickolette Espinola is a 65 y.o. female who presents today for Cough; Nasal Congestion; and Headache  Assessment:   1. Allergic rhinoconjunctivitis.   2. Cough, likely viral upper respiratory infection, afebrile in no respiratory distress.    3.      Complex medical history. Plan:   Patient Instructions  1.  Hold immunotherapy today. 2.  Restart Zyrtec 10 mg once daily. 3.  May use Sudafed 30 mg once twice daily as needed over the next several days. 4.  Saline nasal wash 2-4 times daily. 5.  For the next several days, restart Astelin 1-2 sprays twice daily and then Nasonex as previously. 6.  If persisting symptoms, call with update next week, before restarting immunotherapy 7.  Next week restart immunotherapy at 0.2 cc for 4 occasions, then advance as long as tolerated. 8.  EpiPen, Benadryl as needed. 9.  Follow-up in July or sooner if needed.  HPI:  Eloni returns to the office regarding allergic rhinoconjunctivitis on immunotherapy with last visit in our office in July. She reports there have been a few red, tiny slightly hive-like areas with allergy injections, therefore, dose decreased, but no large local or systemic reactions.  She is pleased with her regime.  Overall describes she has been doing well except a recent respiratory illness in the last week with nasal congestion, sneezing, or tickling postnasal drip, and throat clearing cough.  She notices her hearing is slightly muffled with ear pressure, but clearly remember sick contacts.  She thought her nasal passages were irritated too much to use the nose spray(Nasonex and Astelin) after having a nosebleed at the beginning of this illness. She is improving and feels sleep, activity and appetite are normal.  Denies fever, difficulty  breathing or shortness of breath or other associated concerns.  Her other physicians continue to manage her migraines, now improved and she is anticipating a neck operation.  Denies ED or urgent care visits, prednisone or antibiotic courses. Reports sleep and activity are normal.  Current Medications: 1.  Zyrtec 10 mg daily. 2.  Optivar, EpiPen, Benadryl as needed. 3.  Continues vitamin D, Nexium, calcium, biotin, magnesium, multivitamin, Macrodantin, vitamin C and Vesicare. 4.  As needed Relpax, Percocet and MetroGel.  Drug Allergies: Allergies  Allergen Reactions  . Latex   . Sulfa Antibiotics Other (See Comments)    Causes skin reaction ( on her mid back)  Pigmentation.   . Valium [Diazepam]    Objective:   Filed Vitals:   03/04/15 0923  BP: 126/78  Pulse: 64  Temp: 98.2 F (36.8 C)  Resp: 20   Physical Exam  Constitutional: She is well-developed, well-nourished, and in no distress.  Alert interactive communicating easily in full sentences with slightly nasal voice.  HENT:  Head: Atraumatic.  Right Ear: Tympanic membrane and ear canal normal.  Left Ear: Tympanic membrane and ear canal normal.  Nose: Mucosal edema (slight pallor) and rhinorrhea (clear mucus) present. No epistaxis.  Mouth/Throat: Oropharynx is clear and moist and mucous membranes are normal. No oropharyngeal exudate, posterior oropharyngeal edema or posterior oropharyngeal erythema.  Neck: Neck supple.  Cardiovascular: Normal rate, S1 normal and S2 normal.   No murmur heard. Pulmonary/Chest: Effort normal. She has no wheezes. She has no rhonchi. She has no rales.  Lymphadenopathy:  She has no cervical adenopathy.   Diagnostics: Spirometry:  FVC  2.92--102%, FEV1 2.80--116%.    Renel Ende M. Ishmael Holter, MD  cc: No primary care provider on file.

## 2015-03-04 NOTE — Patient Instructions (Signed)
  Hold immunotherapy today.  Restart Zyrtec 10 mg once daily.  May use Sudafed 30 mg once twice daily as needed over the next several days.  Saline nasal wash 2-4 times daily.  In the next several days, restart Astelin 1-2 sprays twice daily and then Nasonex as previously.  If persisting symptoms, call with update next week.  Next week restart immunotherapy at 0.2 cc for 4 occasions, then advance as long as tolerated.  EpiPen, Benadryl as needed.  Follow-up in July or sooner if needed.

## 2015-03-11 ENCOUNTER — Ambulatory Visit (INDEPENDENT_AMBULATORY_CARE_PROVIDER_SITE_OTHER)

## 2015-03-11 DIAGNOSIS — J309 Allergic rhinitis, unspecified: Secondary | ICD-10-CM

## 2015-03-25 ENCOUNTER — Ambulatory Visit (INDEPENDENT_AMBULATORY_CARE_PROVIDER_SITE_OTHER)

## 2015-03-25 DIAGNOSIS — J309 Allergic rhinitis, unspecified: Secondary | ICD-10-CM | POA: Diagnosis not present

## 2015-03-27 DIAGNOSIS — J3089 Other allergic rhinitis: Secondary | ICD-10-CM | POA: Diagnosis not present

## 2015-04-01 ENCOUNTER — Ambulatory Visit (INDEPENDENT_AMBULATORY_CARE_PROVIDER_SITE_OTHER)

## 2015-04-01 DIAGNOSIS — J309 Allergic rhinitis, unspecified: Secondary | ICD-10-CM | POA: Diagnosis not present

## 2015-04-08 ENCOUNTER — Ambulatory Visit (INDEPENDENT_AMBULATORY_CARE_PROVIDER_SITE_OTHER)

## 2015-04-08 DIAGNOSIS — J309 Allergic rhinitis, unspecified: Secondary | ICD-10-CM | POA: Diagnosis not present

## 2015-04-22 ENCOUNTER — Ambulatory Visit (INDEPENDENT_AMBULATORY_CARE_PROVIDER_SITE_OTHER)

## 2015-04-22 DIAGNOSIS — J309 Allergic rhinitis, unspecified: Secondary | ICD-10-CM | POA: Diagnosis not present

## 2015-04-29 ENCOUNTER — Ambulatory Visit (INDEPENDENT_AMBULATORY_CARE_PROVIDER_SITE_OTHER)

## 2015-04-29 DIAGNOSIS — J309 Allergic rhinitis, unspecified: Secondary | ICD-10-CM | POA: Diagnosis not present

## 2015-05-11 ENCOUNTER — Other Ambulatory Visit: Payer: Self-pay | Admitting: Neurology

## 2015-05-13 ENCOUNTER — Ambulatory Visit (INDEPENDENT_AMBULATORY_CARE_PROVIDER_SITE_OTHER)

## 2015-05-13 DIAGNOSIS — J309 Allergic rhinitis, unspecified: Secondary | ICD-10-CM | POA: Diagnosis not present

## 2015-05-20 ENCOUNTER — Ambulatory Visit (INDEPENDENT_AMBULATORY_CARE_PROVIDER_SITE_OTHER)

## 2015-05-20 DIAGNOSIS — J309 Allergic rhinitis, unspecified: Secondary | ICD-10-CM | POA: Diagnosis not present

## 2015-05-27 ENCOUNTER — Ambulatory Visit (INDEPENDENT_AMBULATORY_CARE_PROVIDER_SITE_OTHER)

## 2015-05-27 DIAGNOSIS — J309 Allergic rhinitis, unspecified: Secondary | ICD-10-CM | POA: Diagnosis not present

## 2015-06-03 ENCOUNTER — Ambulatory Visit (INDEPENDENT_AMBULATORY_CARE_PROVIDER_SITE_OTHER)

## 2015-06-03 DIAGNOSIS — J309 Allergic rhinitis, unspecified: Secondary | ICD-10-CM

## 2015-06-10 ENCOUNTER — Ambulatory Visit (INDEPENDENT_AMBULATORY_CARE_PROVIDER_SITE_OTHER)

## 2015-06-10 DIAGNOSIS — J309 Allergic rhinitis, unspecified: Secondary | ICD-10-CM

## 2015-06-16 ENCOUNTER — Telehealth: Payer: Self-pay | Admitting: Allergy and Immunology

## 2015-06-16 NOTE — Telephone Encounter (Signed)
[employer] shows an outstanding amount that husband states he shows has already been paid. Please call [name] back at [phone]

## 2015-06-17 ENCOUNTER — Ambulatory Visit (INDEPENDENT_AMBULATORY_CARE_PROVIDER_SITE_OTHER)

## 2015-06-17 DIAGNOSIS — J309 Allergic rhinitis, unspecified: Secondary | ICD-10-CM | POA: Diagnosis not present

## 2015-06-24 ENCOUNTER — Ambulatory Visit (INDEPENDENT_AMBULATORY_CARE_PROVIDER_SITE_OTHER): Admitting: *Deleted

## 2015-06-24 DIAGNOSIS — J309 Allergic rhinitis, unspecified: Secondary | ICD-10-CM | POA: Diagnosis not present

## 2015-06-30 NOTE — Telephone Encounter (Signed)
EXPLAINED BILL TO PT'S HUSBAND - 2 DATES OF SERVICE WERE PAID BY INS AFTER HE HAD PAID

## 2015-07-01 ENCOUNTER — Ambulatory Visit (INDEPENDENT_AMBULATORY_CARE_PROVIDER_SITE_OTHER): Admitting: *Deleted

## 2015-07-01 DIAGNOSIS — J309 Allergic rhinitis, unspecified: Secondary | ICD-10-CM | POA: Diagnosis not present

## 2015-07-08 ENCOUNTER — Ambulatory Visit (INDEPENDENT_AMBULATORY_CARE_PROVIDER_SITE_OTHER)

## 2015-07-08 DIAGNOSIS — J309 Allergic rhinitis, unspecified: Secondary | ICD-10-CM

## 2015-07-15 ENCOUNTER — Ambulatory Visit (INDEPENDENT_AMBULATORY_CARE_PROVIDER_SITE_OTHER)

## 2015-07-15 DIAGNOSIS — J309 Allergic rhinitis, unspecified: Secondary | ICD-10-CM | POA: Diagnosis not present

## 2015-07-22 ENCOUNTER — Ambulatory Visit (INDEPENDENT_AMBULATORY_CARE_PROVIDER_SITE_OTHER)

## 2015-07-22 DIAGNOSIS — J309 Allergic rhinitis, unspecified: Secondary | ICD-10-CM | POA: Diagnosis not present

## 2015-07-28 ENCOUNTER — Encounter: Payer: Self-pay | Admitting: Nurse Practitioner

## 2015-07-28 ENCOUNTER — Ambulatory Visit (INDEPENDENT_AMBULATORY_CARE_PROVIDER_SITE_OTHER): Admitting: Nurse Practitioner

## 2015-07-28 VITALS — BP 137/80 | HR 65 | Ht 66.0 in | Wt 177.4 lb

## 2015-07-28 DIAGNOSIS — G43909 Migraine, unspecified, not intractable, without status migrainosus: Secondary | ICD-10-CM

## 2015-07-28 NOTE — Progress Notes (Signed)
[facility]  PATIENT: Jordan Mahoney DOB: [birth year]   REASON FOR VISIT: Follow-up for migraine headaches HISTORY FROM: Patient    HISTORY OF PRESENT ILLNESS:Jordan Mahoney is 65 years old right-handed Caucasian female, accompanied by her husband, referred to primary care physician Dr. Kieth Brightly, and ENT Dr. Redmond Pulling for evaluation of frequent headaches She reported long history of headaches since teenager, it was typical migraine, holocranial severe pounding headaches, with associated light noise sensitivity, nauseous, can last few hours to days, over the years, she has been dealing with her migraine to variable degree, previously tried Imitrex, which does help her migraine, but causing palpitation She reported increased migraine since 2014, she had about once every month severe prolonged typical migraine headaches, in between, she also has almost daily low-grade pressure headache behind her eyes, with associated light noise smell sensitivity. 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 She also had a history of gastric bypass in 2010, initially with 100 pound loss, now dealing with her frequent headaches, left elbow pain, recent surgery for tendinitis, she has regained 40 pounds, She report 2 month history of urinary incontinence, since August 2015, she also noticed bilateral fingertips paresthesia, but denies significant neck pain, denies significant gait difficulty Laboratory evaluation in June 2015 showed normal CBC, ferritin 28.2, LDL 88, normal iron level, TSH 2.2 9, vitamin D 37.9, B12 570  UPDATE Dec 26 2013: She has tired Maxalt 5 mg, which has been helfpful, but she also has to take second dosage because headaches comes back a few hours later, she also complains of fatigue, jaw tightness after medications, she is on higher dose of Topamax 100 mg twice a day, complains of fingertips, toes  numbness, fatigue, Her headache overall has much improved, she has. In between, she is headache free, she denies gait difficulty, no significant neck pain, But she complains of acute onset urinary urgency, occasionally incontinence, Vesicare has been helpful, now is on generic oxybutynin,  We have reviewed MRI of the cervical spine, which has demonstrated multilevel degenerative disc disease, most severe at C5-6, C6 and 7, with mild-to-moderate canal stenosis, there was no cord signal change  UPDATE Feb 10th 2016: She is now tolerating Topamax 100mg  bid, still has mild fingertips paresthesia, she is also taking magnesium, and riboflavin, her headache are under much better control, couple times each months, she had her typical migraine headaches, Maxalta 5mg  prn, was helpful, but often has to take second dosage, trigger for her migraines are weather change, strong smell, certain food, wines, processed food,  UPDATE 09/25/2014: Jordan Mahoney, 65 year old female returns for follow-up. She has a history of migraines and her migraines have worsened in the last couple of weeks due to changes in the weather she claims. Her headache today is a 10 on the pain scale of 1-10. She is nauseated. She has photophobia. She remains on her Topamax as preventive. She did not take her Maxalt this morning. She returns for reevaluation UPDATE 12/26/2014 Jordan Mahoney, 65year-old female returns for follow-up. She has a history of migraine headaches. She is currently on Topamax 100 twice daily and her triptan has been changed to Relpax, works acutely for her. Her headaches are in good control. Her biggest triggers are weather changes, certain foods and strong perfumes. She tries to avoid these if at all possible. She returns for reevaluation UPDATE 06/12/2017CM Jordan Mahoney, 65 year old female returns for follow-up. She has a history of headaches/migraines and  was on Topamax when last seen however she went on vacation, forgot the  medication, and she has not resumed. She claims her headaches are in good control she takes Relpax acutely. Her biggest trigger  are weather changes and strong odors. She returns for reevaluation She has had abnormal liver function tests and is due for liver scan  tomorrow .  REVIEW OF SYSTEMS: Full 14 system review of systems performed and notable only for those listed, all others are neg:  Constitutional: neg  Cardiovascular: neg Ear/Nose/Throat: neg  Skin: neg Eyes: neg Respiratory: neg Gastroitestinal: neg  Hematology/Lymphatic: neg  Endocrine: neg Musculoskeletal:neg Allergy/Immunology: neg Neurological: History of migraines currently well-controlled Psychiatric: neg Sleep : neg   ALLERGIES: Allergies  Allergen Reactions  . Latex   . Sulfa Antibiotics Other (See Comments)    Causes skin reaction ( on her mid back)  Pigmentation.   . Valium [Diazepam]     HOME MEDICATIONS: Outpatient Prescriptions Prior to Visit  Medication Sig Dispense Refill  . azelastine (ASTELIN) 0.1 % nasal spray Place 1 spray into both nostrils daily. Use in each nostril as directed    . azelastine (OPTIVAR) 0.05 % ophthalmic solution 1 drop 2 (two) times daily.    . calcium carbonate (TUMS - DOSED IN MG ELEMENTAL CALCIUM) 500 MG chewable tablet Chew 1 tablet by mouth 3 (three) times daily.    [name] CALCIUM CITRATE PO Take 500 mg by mouth 3 (three) times daily.    . cetirizine (ZYRTEC) 10 MG tablet TAKE 1 TABLET DAILY FOR RUNNY NOSE OR ITCHING 90 tablet 0  . eletriptan (RELPAX) 40 MG tablet Take 1 tablet (40 mg total) by mouth as needed for migraine or headache. May repeat in 2 hours if headache persists or recurs. 27 tablet 1  . EPINEPHrine (EPIPEN 2-PAK) 0.3 mg/0.3 mL IJ SOAJ injection Inject 0.3 mg into the muscle as needed (for severe life-threatening allergic reaction).    . Ergocalciferol (VITAMIN D2) 2000 UNITS TABS Take by mouth daily.    [name] esomeprazole (NEXIUM) 20 MG capsule Take 20 mg by mouth 2  (two) times daily before a meal.    . Magnesium 400 MG CAPS Take 400 mg by mouth daily.    . metroNIDAZOLE (METROGEL) 0.75 % vaginal gel Place 1 Applicatorful vaginally daily.    . mometasone (NASONEX) 50 MCG/ACT nasal spray Place 2 sprays into the nose 2 (two) times daily.    . Multiple Vitamin (MULTIVITAMIN) tablet Take 1 tablet by mouth daily.    . nitrofurantoin (MACRODANTIN) 50 MG capsule Take 50 mg by mouth. Takes on Monday, Wednesday, and Friday.    [name] oxyCODONE-acetaminophen (PERCOCET/ROXICET) 5-325 MG per tablet     . Riboflavin 100 MG CAPS Take 100 mg by mouth 2 (two) times daily.    . solifenacin (VESICARE) 10 MG tablet Take 10 mg by mouth daily.    . vitamin C (ASCORBIC ACID) 500 MG tablet Take 500 mg by mouth 2 (two) times daily.    [name] ZOSTAVAX 06269 UNT/0.65ML injection Reported on 03/04/2015    . Biotin 2500 MCG CAPS Take by mouth daily. Reported on 07/28/2015    . CRANBERRY PO Take 8,400 mg by mouth 2 (two) times daily. Reported on 03/04/2015    . topiramate (TOPAMAX) 100 MG tablet TAKE 1 TABLET TWICE A DAY 180 tablet 3   No facility-administered medications prior to visit.    PAST MEDICAL HISTORY: Past Medical History  Diagnosis Date  . HA (headache)  PAST SURGICAL HISTORY: Past Surgical History  Procedure Laterality Date  . Cataract extraction Right   . C sections    . Belly button    . Wrist surgery Right   . Elbow surgery Left     FAMILY HISTORY: Family History  Problem Relation Age of Onset  . Diabetes Father     SOCIAL HISTORY: Social History   Social History  . Marital Status: Unknown    Spouse Name: N/A  . Number of Children: 2  . Years of Education: college   Occupational History  .      retired   Social History Main Topics  . Smoking status: Former Research scientist (life sciences)  . Smokeless tobacco: Never Used     Comment: Quit in 1985  . Alcohol Use: 0.6 oz/week    1 Cans of beer per week     Comment: Rare   . Drug Use: No  . Sexual Activity: Not on file    Other Topics Concern  . Not on file   Social History Narrative   Patient lives at home with her husband [name]).    Education two years of college.   Caffeine two glasses of tea.     PHYSICAL EXAM  Filed Vitals:   07/28/15 0851  BP: 137/80  Pulse: 65  Height: '5\' 6"'$  (1.676 m)  Weight: 177 lb 6.4 oz (80.468 kg)   Body mass index is 28.65 kg/(m^2). Generalized: In no acute distress Neck: Supple, no carotid bruits  Cardiac: Regular rate rhythm Musculoskeletal: No deformity  Neurological examination Mentation: Alert oriented to time, place, history taking, and causual conversation  Cranial nerve II-XII: PERL. Extraocular movements were full. Visual field were full on confrontational test.  Facial sensation and strength were normal. Hearing was intact to finger rubbing bilaterally. Uvula tongue midline. Head turning and shoulder shrug and were normal and symmetric.Tongue protrusion into cheek strength was normal. Motor: Normal tone, bulk and strength, except limited range of motion of left elbow, Sensory: Intact to fine touch, pinprick, preserved vibratory sensation, and proprioception at toes. Coordination: Normal finger to nose, heel-to-shin bilaterally there was no truncal ataxia Gait: Ambulated short distance, can heel toe and tandem without difficulty Deep tendon reflexes: Brachioradialis 2/2, biceps 2/2, triceps 2/2, patellar 3/3 , Achilles 2/2, plantar responses were flexor bilaterally  DIAGNOSTIC DATA (LABS, IMAGING, TESTING)  ASSESSMENT AND PLAN  65 y.o. year old female  has a past medical history of HA (headache). here to follow-up   PLANContinue Relpax acutely will refill Continue to avoid migraine triggers If you decide to get back on Topamax give me a call Follow-up yearly and when necessary  Dennie Bible, [facility], [facility], APRN  [facility] [address] [place], [place] [address] [phone]

## 2015-07-28 NOTE — Patient Instructions (Signed)
Continue Relpax acutely does will refill for 1 year Continue to avoid migraine triggers Follow-up yearly and prn If you decide to get back on Topamax give me a call

## 2015-07-29 ENCOUNTER — Ambulatory Visit (INDEPENDENT_AMBULATORY_CARE_PROVIDER_SITE_OTHER): Admitting: *Deleted

## 2015-07-29 DIAGNOSIS — J309 Allergic rhinitis, unspecified: Secondary | ICD-10-CM | POA: Diagnosis not present

## 2015-08-01 ENCOUNTER — Telehealth: Payer: Self-pay | Admitting: Nurse Practitioner

## 2015-08-01 MED ORDER — ELETRIPTAN HYDROBROMIDE 40 MG PO TABS: 40.0000 mg | ORAL_TABLET | ORAL | Status: DC | PRN

## 2015-08-01 NOTE — Telephone Encounter (Signed)
Patient requesting refill of  eletriptan (RELPAX) 40 MG [facility]

## 2015-08-01 NOTE — Telephone Encounter (Signed)
Done

## 2015-08-05 ENCOUNTER — Ambulatory Visit (INDEPENDENT_AMBULATORY_CARE_PROVIDER_SITE_OTHER)

## 2015-08-05 DIAGNOSIS — J309 Allergic rhinitis, unspecified: Secondary | ICD-10-CM | POA: Diagnosis not present

## 2015-08-05 NOTE — Progress Notes (Signed)
I have reviewed and agreed above plan. 

## 2015-08-26 ENCOUNTER — Ambulatory Visit (INDEPENDENT_AMBULATORY_CARE_PROVIDER_SITE_OTHER): Admitting: Allergy and Immunology

## 2015-08-26 ENCOUNTER — Ambulatory Visit (INDEPENDENT_AMBULATORY_CARE_PROVIDER_SITE_OTHER)

## 2015-08-26 ENCOUNTER — Encounter: Payer: Self-pay | Admitting: Allergy and Immunology

## 2015-08-26 VITALS — BP 122/70 | HR 67 | Temp 97.7°F | Resp 18

## 2015-08-26 DIAGNOSIS — J309 Allergic rhinitis, unspecified: Secondary | ICD-10-CM | POA: Diagnosis not present

## 2015-08-26 DIAGNOSIS — H1010 Acute atopic conjunctivitis, unspecified eye: Secondary | ICD-10-CM | POA: Diagnosis not present

## 2015-08-26 MED ORDER — CETIRIZINE HCL 10 MG PO TABS: ORAL_TABLET | ORAL | Status: DC

## 2015-08-26 MED ORDER — AZELASTINE HCL 0.1 % NA SOLN: NASAL | Status: DC

## 2015-08-26 NOTE — Progress Notes (Signed)
     FOLLOW UP NOTE  RE: Zena Vitelli MRN: [id number] DOB: 09-26-1950 [facility] OF [place]. [address] [place], [place] [address] Date of Office Visit: 08/26/2015  Subjective:  Jordan Mahoney is a 65 y.o. female who presents today for Allergic Rhinitis  Assessment:   1. Allergic rhinoconjunctivitis, on immunotherapy, beneficial and tolerating without difficulty.    Plan:   Meds ordered this encounter  Medications  . azelastine (ASTELIN) 0.1 % nasal spray    Sig: 1 Spray into each nostril daily.    Dispense:  90 mL    Refill:  1  . cetirizine (ZYRTEC) 10 MG tablet    Sig: 1 tablet daily for runny nose or itching.    Dispense:  90 tablet    Refill:  1  1.  Continue allergen immunotherapy per protocol.   2.  Continue current medication regime. 3.  Epi-pen per protocol. 4.  Follow-up in 9 months or sooner if needed.   HPI: Myriah returns to the office in follow-up of allergic rhinitis, last visit January this year.  She is feeling well and tolerating immunotherapy without large local or systemic reactions, receiving red vial.  She is pleased with how well she is doing and has no new concerns.  Denies any recurring nasal symptoms nor any cough.  She typically uses nasal sprays as needed and Zyrtec daily.Epi-pen is up to date.    Is to follow with GI physician for liver evaluation, otherwise well.  Denies ED or urgent care visits, prednisone or antibiotic courses. Reports sleep and activity are normal.  Keneshia has a current medication list which includes the following prescription(s): calcium citrate, eletriptan, epinephrine, vitamin d2, esomeprazole, magnesium, mometasone, multivitamin, nitrofurantoin, riboflavin, solifenacin, vitamin c, azelastine, cetirizine.  Drug Allergies: Allergies  Allergen Reactions  . Latex   . Sulfa Antibiotics Other (See Comments)    Causes skin reaction ( on her mid back)  Pigmentation.   . Valium [Diazepam]    Objective:    Filed Vitals:   08/26/15 1155  BP: 122/70  Pulse: 67  Temp: 97.7 F (36.5 C)  Resp: 18   SpO2 Readings from Last 1 Encounters:  08/26/15 97%   Physical Exam  Constitutional: She is well-developed, well-nourished, and in no distress.  HENT:  Head: Atraumatic.  Right Ear: Tympanic membrane and ear canal normal.  Left Ear: Tympanic membrane and ear canal normal.  Nose: Mucosal edema present. No rhinorrhea. No epistaxis.  Mouth/Throat: Oropharynx is clear and moist and mucous membranes are normal. No oropharyngeal exudate, posterior oropharyngeal edema or posterior oropharyngeal erythema.  Neck: Neck supple.  Cardiovascular: Normal rate, S1 normal and S2 normal.   No murmur heard. Pulmonary/Chest: Effort normal. She has no wheezes. She has no rhonchi. She has no rales.  Lymphadenopathy:    She has no cervical adenopathy.     Roselyn M. Ishmael Holter, MD  cc: Barry Dienes, NP

## 2015-08-26 NOTE — Patient Instructions (Signed)
   Continue current regime.  Epi-pen per protocol.

## 2015-08-29 ENCOUNTER — Encounter: Payer: Self-pay | Admitting: Allergy and Immunology

## 2015-09-02 ENCOUNTER — Ambulatory Visit (INDEPENDENT_AMBULATORY_CARE_PROVIDER_SITE_OTHER)

## 2015-09-02 DIAGNOSIS — J309 Allergic rhinitis, unspecified: Secondary | ICD-10-CM

## 2015-09-09 ENCOUNTER — Ambulatory Visit (INDEPENDENT_AMBULATORY_CARE_PROVIDER_SITE_OTHER)

## 2015-09-09 DIAGNOSIS — J309 Allergic rhinitis, unspecified: Secondary | ICD-10-CM | POA: Diagnosis not present

## 2015-09-16 ENCOUNTER — Ambulatory Visit (INDEPENDENT_AMBULATORY_CARE_PROVIDER_SITE_OTHER)

## 2015-09-16 DIAGNOSIS — J309 Allergic rhinitis, unspecified: Secondary | ICD-10-CM

## 2015-09-17 DIAGNOSIS — J3089 Other allergic rhinitis: Secondary | ICD-10-CM | POA: Diagnosis not present

## 2015-09-23 ENCOUNTER — Ambulatory Visit (INDEPENDENT_AMBULATORY_CARE_PROVIDER_SITE_OTHER): Admitting: *Deleted

## 2015-09-23 DIAGNOSIS — J309 Allergic rhinitis, unspecified: Secondary | ICD-10-CM

## 2015-09-30 ENCOUNTER — Ambulatory Visit (INDEPENDENT_AMBULATORY_CARE_PROVIDER_SITE_OTHER)

## 2015-09-30 DIAGNOSIS — J309 Allergic rhinitis, unspecified: Secondary | ICD-10-CM

## 2015-10-07 ENCOUNTER — Ambulatory Visit (INDEPENDENT_AMBULATORY_CARE_PROVIDER_SITE_OTHER)

## 2015-10-07 DIAGNOSIS — J309 Allergic rhinitis, unspecified: Secondary | ICD-10-CM

## 2015-10-14 ENCOUNTER — Ambulatory Visit (INDEPENDENT_AMBULATORY_CARE_PROVIDER_SITE_OTHER)

## 2015-10-14 DIAGNOSIS — J309 Allergic rhinitis, unspecified: Secondary | ICD-10-CM

## 2015-10-21 ENCOUNTER — Ambulatory Visit (INDEPENDENT_AMBULATORY_CARE_PROVIDER_SITE_OTHER): Admitting: *Deleted

## 2015-10-21 DIAGNOSIS — J309 Allergic rhinitis, unspecified: Secondary | ICD-10-CM

## 2015-10-28 ENCOUNTER — Ambulatory Visit (INDEPENDENT_AMBULATORY_CARE_PROVIDER_SITE_OTHER): Admitting: *Deleted

## 2015-10-28 DIAGNOSIS — J309 Allergic rhinitis, unspecified: Secondary | ICD-10-CM

## 2015-11-04 ENCOUNTER — Ambulatory Visit (INDEPENDENT_AMBULATORY_CARE_PROVIDER_SITE_OTHER): Admitting: *Deleted

## 2015-11-04 DIAGNOSIS — J309 Allergic rhinitis, unspecified: Secondary | ICD-10-CM | POA: Diagnosis not present

## 2015-11-11 ENCOUNTER — Ambulatory Visit (INDEPENDENT_AMBULATORY_CARE_PROVIDER_SITE_OTHER): Admitting: *Deleted

## 2015-11-11 DIAGNOSIS — J309 Allergic rhinitis, unspecified: Secondary | ICD-10-CM | POA: Diagnosis not present

## 2015-11-18 ENCOUNTER — Ambulatory Visit (INDEPENDENT_AMBULATORY_CARE_PROVIDER_SITE_OTHER): Admitting: *Deleted

## 2015-11-18 DIAGNOSIS — J309 Allergic rhinitis, unspecified: Secondary | ICD-10-CM | POA: Diagnosis not present

## 2015-11-25 ENCOUNTER — Ambulatory Visit (INDEPENDENT_AMBULATORY_CARE_PROVIDER_SITE_OTHER): Admitting: *Deleted

## 2015-11-25 DIAGNOSIS — J309 Allergic rhinitis, unspecified: Secondary | ICD-10-CM | POA: Diagnosis not present

## 2015-12-01 DIAGNOSIS — J3089 Other allergic rhinitis: Secondary | ICD-10-CM | POA: Diagnosis not present

## 2015-12-02 ENCOUNTER — Ambulatory Visit (INDEPENDENT_AMBULATORY_CARE_PROVIDER_SITE_OTHER): Admitting: *Deleted

## 2015-12-02 DIAGNOSIS — J309 Allergic rhinitis, unspecified: Secondary | ICD-10-CM | POA: Diagnosis not present

## 2015-12-09 ENCOUNTER — Ambulatory Visit (INDEPENDENT_AMBULATORY_CARE_PROVIDER_SITE_OTHER)

## 2015-12-09 DIAGNOSIS — J309 Allergic rhinitis, unspecified: Secondary | ICD-10-CM

## 2015-12-16 ENCOUNTER — Ambulatory Visit (INDEPENDENT_AMBULATORY_CARE_PROVIDER_SITE_OTHER): Admitting: *Deleted

## 2015-12-16 DIAGNOSIS — J309 Allergic rhinitis, unspecified: Secondary | ICD-10-CM | POA: Diagnosis not present

## 2015-12-23 ENCOUNTER — Ambulatory Visit (INDEPENDENT_AMBULATORY_CARE_PROVIDER_SITE_OTHER): Admitting: *Deleted

## 2015-12-23 ENCOUNTER — Telehealth: Payer: Self-pay | Admitting: Nurse Practitioner

## 2015-12-23 DIAGNOSIS — J309 Allergic rhinitis, unspecified: Secondary | ICD-10-CM

## 2015-12-23 NOTE — Telephone Encounter (Signed)
I am glad to restart Topamax but  Since she has been off the drug you have to start at lower dose. Please start at '50mg'$  BID and have patient follow up in 1 month with record of headaches.

## 2015-12-23 NOTE — Telephone Encounter (Signed)
Please let patient know she can start out with '50mg'$  twice daily and we can titrate at next appt. Please call med in I placed on med list

## 2015-12-23 NOTE — Telephone Encounter (Signed)
I spoke to Jordan Mahoney she has noted increase of her migraines and has stated that she is taking the relpax more then before.  She wanted to take the topamax (since being off this had no change in her liver functions) so she would like to titrate sooner then her follow up appt which is in June 2018.  What would you consider?

## 2015-12-23 NOTE — Telephone Encounter (Signed)
Last note pt taking topiramate '100mg'$  po bid.

## 2015-12-23 NOTE — Telephone Encounter (Signed)
Patient called to advise NP, Hoyle Sauer that she is ready to start back on TOPIRAMATE, please send to [facility].

## 2015-12-24 MED ORDER — TOPIRAMATE 50 MG PO TABS: 50.0000 mg | ORAL_TABLET | Freq: Two times a day (BID) | ORAL | 1 refills | Status: DC

## 2015-12-24 NOTE — Telephone Encounter (Signed)
I called pt and relayed the message from CM/NP.  Pt wanted to use [facility] in [place].  Placed order for 60 tabs topiramate '50mg'$  po bid, refill x 1.  Made RV appt 02-02-16 at 1015.  Will bring record of headaches.

## 2015-12-30 ENCOUNTER — Ambulatory Visit (INDEPENDENT_AMBULATORY_CARE_PROVIDER_SITE_OTHER): Admitting: *Deleted

## 2015-12-30 DIAGNOSIS — J309 Allergic rhinitis, unspecified: Secondary | ICD-10-CM | POA: Diagnosis not present

## 2016-01-06 ENCOUNTER — Other Ambulatory Visit: Payer: Self-pay

## 2016-01-06 ENCOUNTER — Ambulatory Visit (INDEPENDENT_AMBULATORY_CARE_PROVIDER_SITE_OTHER)

## 2016-01-06 DIAGNOSIS — J309 Allergic rhinitis, unspecified: Secondary | ICD-10-CM

## 2016-01-06 MED ORDER — MOMETASONE FUROATE 50 MCG/ACT NA SUSP: 2.0000 | Freq: Two times a day (BID) | NASAL | 2 refills | Status: DC

## 2016-01-06 MED ORDER — AZELASTINE HCL 0.1 % NA SOLN: NASAL | 2 refills | Status: DC

## 2016-01-06 NOTE — Telephone Encounter (Signed)
Pt was in office receiving allergy injections. She requested refills on her nasal sprays.

## 2016-01-13 ENCOUNTER — Ambulatory Visit (INDEPENDENT_AMBULATORY_CARE_PROVIDER_SITE_OTHER): Admitting: *Deleted

## 2016-01-13 DIAGNOSIS — J309 Allergic rhinitis, unspecified: Secondary | ICD-10-CM | POA: Diagnosis not present

## 2016-01-20 ENCOUNTER — Ambulatory Visit (INDEPENDENT_AMBULATORY_CARE_PROVIDER_SITE_OTHER): Admitting: *Deleted

## 2016-01-20 DIAGNOSIS — J309 Allergic rhinitis, unspecified: Secondary | ICD-10-CM | POA: Diagnosis not present

## 2016-01-22 HISTORY — PX: GANGLION CYST EXCISION: SHX1691

## 2016-01-27 ENCOUNTER — Ambulatory Visit (INDEPENDENT_AMBULATORY_CARE_PROVIDER_SITE_OTHER): Admitting: *Deleted

## 2016-01-27 DIAGNOSIS — J309 Allergic rhinitis, unspecified: Secondary | ICD-10-CM | POA: Diagnosis not present

## 2016-02-02 ENCOUNTER — Encounter: Payer: Self-pay | Admitting: Nurse Practitioner

## 2016-02-02 ENCOUNTER — Ambulatory Visit (INDEPENDENT_AMBULATORY_CARE_PROVIDER_SITE_OTHER): Admitting: Nurse Practitioner

## 2016-02-02 VITALS — BP 137/86 | HR 68 | Ht 66.0 in | Wt 183.0 lb

## 2016-02-02 DIAGNOSIS — G43909 Migraine, unspecified, not intractable, without status migrainosus: Secondary | ICD-10-CM

## 2016-02-02 MED ORDER — ELETRIPTAN HYDROBROMIDE 40 MG PO TABS: 40.0000 mg | ORAL_TABLET | ORAL | 2 refills | Status: DC | PRN

## 2016-02-02 MED ORDER — TOPIRAMATE 50 MG PO TABS: 50.0000 mg | ORAL_TABLET | Freq: Two times a day (BID) | ORAL | 1 refills | Status: DC

## 2016-02-02 NOTE — Progress Notes (Signed)
I have reviewed and agreed above plan. 

## 2016-02-02 NOTE — Progress Notes (Signed)
[facility]  PATIENT: Jordan Mahoney DOB: [birth year]   REASON FOR VISIT: Follow-up for migraine headaches HISTORY FROM: Patient    HISTORY OF PRESENT ILLNESS:Jordan Mahoney is 65 years old right-handed Caucasian female, accompanied by her husband, referred to primary care physician Dr. Kieth Brightly, and ENT Dr. Redmond Pulling for evaluation of frequent headaches She reported long history of headaches since teenager, it was typical migraine, holocranial severe pounding headaches, with associated light noise sensitivity, nauseous, can last few hours to days, over the years, she has been dealing with her migraine to variable degree, previously tried Imitrex, which does help her migraine, but causing palpitation She reported increased migraine since 2014, she had about once every month severe prolonged typical migraine headaches, in between, she also has almost daily low-grade pressure headache behind her eyes, with associated light noise smell sensitivity. 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 She also had a history of gastric bypass in 2010, initially with 100 pound loss, now dealing with her frequent headaches, left elbow pain, recent surgery for tendinitis, she has regained 40 pounds, She report 2 month history of urinary incontinence, since August 2015, she also noticed bilateral fingertips paresthesia, but denies significant neck pain, denies significant gait difficulty Laboratory evaluation in June 2015 showed normal CBC, ferritin 28.2, LDL 88, normal iron level, TSH 2.2 9, vitamin D 37.9, B12 570  UPDATE Dec 26 2013: She has tired Maxalt 5 mg, which has been helfpful, but she also has to take second dosage because headaches comes back a few hours later, she also complains of fatigue, jaw tightness after medications, she is on higher dose of Topamax 100 mg twice a day, complains of fingertips, toes  numbness, fatigue, Her headache overall has much improved, she has. In between, she is headache free, she denies gait difficulty, no significant neck pain, But she complains of acute onset urinary urgency, occasionally incontinence, Vesicare has been helpful, now is on generic oxybutynin,  We have reviewed MRI of the cervical spine, which has demonstrated multilevel degenerative disc disease, most severe at C5-6, C6 and 7, with mild-to-moderate canal stenosis, there was no cord signal change  UPDATE Feb 10th 2016: She is now tolerating Topamax 100mg  bid, still has mild fingertips paresthesia, she is also taking magnesium, and riboflavin, her headache are under much better control, couple times each months, she had her typical migraine headaches, Maxalta 5mg  prn, was helpful, but often has to take second dosage, trigger for her migraines are weather change, strong smell, certain food, wines, processed food,  UPDATE 09/25/2014: Jordan Mahoney, 65 year old female returns for follow-up. She has a history of migraines and her migraines have worsened in the last couple of weeks due to changes in the weather she claims. Her headache today is a 10 on the pain scale of 1-10. She is nauseated. She has photophobia. She remains on her Topamax as preventive. She did not take her Maxalt this morning. She returns for reevaluation UPDATE 12/26/2014 Jordan Mahoney, 65year-old female returns for follow-up. She has a history of migraine headaches. She is currently on Topamax 100 twice daily and her triptan has been changed to Relpax, works acutely for her. Her headaches are in good control. Her biggest triggers are weather changes, certain foods and strong perfumes. She tries to avoid these if at all possible. She returns for reevaluation UPDATE 06/12/2017CM Jordan Mahoney, 65 year old female returns for follow-up. She has a history of headaches/migraines and  was on Topamax when last seen however she went on vacation, forgot the  medication, and she has not resumed. She claims her headaches are in good control she takes Relpax acutely. Her biggest trigger  are weather changes and strong odors. She returns for reevaluation She has had abnormal liver function tests and is due for liver scan  tomorrow . UPDATE 12/18/2017CM Jordan Mahoney, 65 year old female returns for follow-up. She has a history of headaches and migraine and then last seen she was off of her Topamax and just using Relpax acutely. She called on November 7 saying her headaches have increased and she was placed back on Topamax 50 mg twice daily she had previously been on 100 mg twice daily. Her headaches have much improved on the lower dose and she still takes Relpax acutely. For the month of November she had 14 headaches and for the month of December 3 headaches so far. Recent DEXA scan showed osteoporosis and she is due to get Relcast yearly. She returns for reevaluation REVIEW OF SYSTEMS: Full 14 system review of systems performed and notable only for those listed, all others are neg:  Constitutional: neg  Cardiovascular: neg Ear/Nose/Throat: neg  Skin: neg Eyes: neg Respiratory: neg Gastroitestinal: neg  Hematology/Lymphatic: neg  Endocrine: neg Musculoskeletal:neg Allergy/Immunology: neg Neurological: History of migraines currently well-controlled Psychiatric: neg Sleep : neg   ALLERGIES: Allergies  Allergen Reactions  . Latex   . Sulfa Antibiotics Other (See Comments)    Causes skin reaction ( on her mid back)  Pigmentation.   . Valium [Diazepam]     HOME MEDICATIONS: Outpatient Medications Prior to Visit  Medication Sig Dispense Refill  . azelastine (ASTELIN) 0.1 % nasal spray 1 Spray into each nostril daily. 30 mL 2  . azelastine (OPTIVAR) 0.05 % ophthalmic solution 1 drop 2 (two) times daily. Reported on 08/26/2015    . calcium carbonate (TUMS - DOSED IN MG ELEMENTAL CALCIUM) 500 MG chewable tablet Chew 1 tablet by mouth 3 (three) times  daily.    [name] CALCIUM CITRATE PO Take 500 mg by mouth 3 (three) times daily.    . cetirizine (ZYRTEC) 10 MG tablet 1 tablet daily for runny nose or itching. 90 tablet 1  . eletriptan (RELPAX) 40 MG tablet Take 1 tablet (40 mg total) by mouth as needed for migraine or headache. May repeat in 2 hours if headache persists or recurs. 27 tablet 1  . EPINEPHrine (EPIPEN 2-PAK) 0.3 mg/0.3 mL IJ SOAJ injection Inject 0.3 mg into the muscle as needed (for severe life-threatening allergic reaction).    . Ergocalciferol (VITAMIN D2) 2000 UNITS TABS Take by mouth daily.    [name] esomeprazole (NEXIUM) 20 MG capsule Take 20 mg by mouth 2 (two) times daily before a meal.    . Magnesium 400 MG CAPS Take 400 mg by mouth daily.    . metroNIDAZOLE (METROGEL) 0.75 % vaginal gel Place 1 Applicatorful vaginally daily. Reported on 08/26/2015    . mometasone (NASONEX) 50 MCG/ACT nasal spray Place 2 sprays into the nose 2 (two) times daily. 17 g 2  . Multiple Vitamin (MULTIVITAMIN) tablet Take 1 tablet by mouth daily.    . nitrofurantoin (MACRODANTIN) 50 MG capsule Take 50 mg by mouth. Takes on Monday, Wednesday, and Friday.    [name] oxyCODONE-acetaminophen (PERCOCET/ROXICET) 5-325 MG per tablet Reported on 08/26/2015    . Riboflavin 100 MG CAPS Take 100 mg by mouth 2 (two) times daily.    . solifenacin (VESICARE) 10 MG tablet  Take 10 mg by mouth daily.    [name] topiramate (TOPAMAX) 50 MG tablet Take 1 tablet (50 mg total) by mouth 2 (two) times daily. 60 tablet 1  . vitamin C (ASCORBIC ACID) 500 MG tablet Take 500 mg by mouth 2 (two) times daily.    [name] ZOSTAVAX 34742 UNT/0.65ML injection Reported on 03/04/2015     No facility-administered medications prior to visit.     PAST MEDICAL HISTORY: Past Medical History:  Diagnosis Date  . Allergic rhinoconjunctivitis   . HA (headache)     PAST SURGICAL HISTORY: Past Surgical History:  Procedure Laterality Date  . belly button    . c sections    . CATARACT EXTRACTION Right   .  ELBOW SURGERY Left   . GANGLION CYST EXCISION Left 01/22/2016   base of thumb  . WRIST SURGERY Right     FAMILY HISTORY: Family History  Problem Relation Age of Onset  . Diabetes Father   . Allergic rhinitis Neg Hx   . Angioedema Neg Hx   . Asthma Neg Hx   . Eczema Neg Hx   . Immunodeficiency Neg Hx   . Urticaria Neg Hx     SOCIAL HISTORY: Social History   Social History  . Marital status: Unknown    Spouse name: N/A  . Number of children: 2  . Years of education: college   Occupational History  .      retired   Social History Main Topics  . Smoking status: Former Research scientist (life sciences)  . Smokeless tobacco: Never Used     Comment: Quit in 1985  . Alcohol use 0.6 oz/week    1 Cans of beer per week     Comment: Rare   . Drug use: No  . Sexual activity: Not on file   Other Topics Concern  . Not on file   Social History Narrative   Patient lives at home with her husband [name]).    Education two years of college.   Caffeine two glasses of tea.     PHYSICAL EXAM  Vitals:   02/02/16 0940  BP: 137/86  Pulse: 68  Weight: 183 lb (83 kg)  Height: '5\' 6"'$  (1.676 m)   Body mass index is 29.54 kg/m. Generalized: In no acute distress Neck: Supple, no carotid bruits  Cardiac: Regular rate rhythm Musculoskeletal: No deformity  Neurological examination Mentation: Alert oriented to time, place, history taking, and causual conversation  Cranial nerve II-XII: PERL. Extraocular movements were full. Visual field were full on confrontational test.  Facial sensation and strength were normal. Hearing was intact to finger rubbing bilaterally. Uvula tongue midline. Head turning and shoulder shrug and were normal and symmetric.Tongue protrusion into cheek strength was normal. Motor: Normal tone, bulk and strength, except limited range of motion of left elbow, Sensory: Intact to fine touch, pinprick, preserved vibratory sensation, and proprioception at toes. Coordination: Normal finger  to nose, heel-to-shin bilaterally there was no truncal ataxia Gait: Ambulated short distance, can heel toe and tandem without difficulty Deep tendon reflexes: Brachioradialis 2/2, biceps 2/2, triceps 2/2, patellar 3/3 , Achilles 2/2, plantar responses were flexor bilaterally  DIAGNOSTIC DATA (LABS, IMAGING, TESTING)  ASSESSMENT AND PLAN  65 y.o. year old female  has a past medical history of Allergic rhinoconjunctivitis and HA (headache). here to follow-up  For her migraine headaches which have increased since going off of her Topamax in June of last year. For the month of November she had 14 headaches for the month  of December 3 so far. Her Topamax was reinstituted last month 50 mg twice daily and she is doing well. We may not need to titrate to higher dose previously on 100 twice daily  PLANContinue Relpax acutely will refill Continue to avoid migraine triggers Continue Topamax '50mg'$  twice daily Continue to keep a record of your headaches Follow-up  In 6 months Dennie Bible, [facility], [facility], [facility] [address] [place], [place] [address] [phone]

## 2016-02-02 NOTE — Patient Instructions (Addendum)
Continue Relpax acutely will refill Continue to avoid migraine triggers Continue Topamax '50mg'$  twice daily Continue to keep a record of headaches Follow-up  In 6 months

## 2016-02-03 ENCOUNTER — Ambulatory Visit (INDEPENDENT_AMBULATORY_CARE_PROVIDER_SITE_OTHER): Admitting: *Deleted

## 2016-02-03 DIAGNOSIS — J309 Allergic rhinitis, unspecified: Secondary | ICD-10-CM | POA: Diagnosis not present

## 2016-02-06 ENCOUNTER — Other Ambulatory Visit: Payer: Self-pay | Admitting: *Deleted

## 2016-02-06 MED ORDER — MOMETASONE FUROATE 50 MCG/ACT NA SUSP: 2.0000 | Freq: Every day | NASAL | 1 refills | Status: DC

## 2016-02-17 ENCOUNTER — Ambulatory Visit (INDEPENDENT_AMBULATORY_CARE_PROVIDER_SITE_OTHER): Admitting: *Deleted

## 2016-02-17 DIAGNOSIS — J309 Allergic rhinitis, unspecified: Secondary | ICD-10-CM | POA: Diagnosis not present

## 2016-02-18 ENCOUNTER — Telehealth: Payer: Self-pay | Admitting: Nurse Practitioner

## 2016-02-18 MED ORDER — TOPIRAMATE 100 MG PO TABS: 100.0000 mg | ORAL_TABLET | Freq: Two times a day (BID) | ORAL | 2 refills | Status: DC

## 2016-02-18 NOTE — Addendum Note (Signed)
Addended by: Evlyn Courier on: 02/18/2016 04:06 PM   Modules accepted: Orders

## 2016-02-18 NOTE — Telephone Encounter (Signed)
$'100mg'I$  BID called to mail order

## 2016-02-18 NOTE — Telephone Encounter (Signed)
Spoke to pt and let her know that prescription was escribed with topiramate '100mg'$  po bid. [facility].  She was thankful for the call.

## 2016-02-18 NOTE — Telephone Encounter (Signed)
Spoke to pt and she is relating that since last seen, she has been having breakthru daily migraines.  Trigger is weather patterns.  This has caused her to take relpax quite often.  (max of 2 daily prn).  She would like increase of topiramate '100mg'$  po bid [facility].

## 2016-02-18 NOTE — Telephone Encounter (Signed)
Pt called says when she was at last OV 12/18 she thougt  topiramate (TOPAMAX) 50 MG tablet was going to be increased to '100mg'$  twice daily but [facility] '[facility]mg'$  twice daily. Pt says she is still having break thru HA's.

## 2016-02-19 DIAGNOSIS — J3089 Other allergic rhinitis: Secondary | ICD-10-CM | POA: Diagnosis not present

## 2016-02-24 ENCOUNTER — Ambulatory Visit: Payer: Self-pay | Admitting: *Deleted

## 2016-02-24 ENCOUNTER — Encounter: Payer: Self-pay | Admitting: Allergy & Immunology

## 2016-02-24 ENCOUNTER — Ambulatory Visit (INDEPENDENT_AMBULATORY_CARE_PROVIDER_SITE_OTHER): Admitting: Allergy & Immunology

## 2016-02-24 VITALS — BP 140/82 | HR 55 | Temp 98.2°F | Resp 16 | Ht 65.5 in | Wt 180.6 lb

## 2016-02-24 DIAGNOSIS — J309 Allergic rhinitis, unspecified: Secondary | ICD-10-CM

## 2016-02-24 DIAGNOSIS — E739 Lactose intolerance, unspecified: Secondary | ICD-10-CM | POA: Diagnosis not present

## 2016-02-24 DIAGNOSIS — J3089 Other allergic rhinitis: Secondary | ICD-10-CM | POA: Diagnosis not present

## 2016-02-24 MED ORDER — MOMETASONE FUROATE 50 MCG/ACT NA SUSP: 2.0000 | Freq: Every day | NASAL | 1 refills | Status: DC

## 2016-02-24 NOTE — Patient Instructions (Addendum)
1. Chronic nonseasonal allergic rhinitis due to fungal spores - Continue with allergy shots at the same schedule. - Continue with Astelin and cetirizine as needed.  2. Lactose intolerance - We will get blood work to rule out a milk allergy. - Continue with the Lactaid for now.   3. Return in about 1 year (around 02/23/2017).  Please inform us of any Emergency Department visits, hospitalizations, or changes in symptoms. Call us before going to the ED for breathing or allergy symptoms since we might be able to fit you in for a sick visit. Feel free to contact us anytime with any questions, problems, or concerns.  It was a pleasure to meet you today! Best wishes in the [place] Year!   Websites that have reliable patient information: 1. American Academy of Asthma, Allergy, and Immunology: www.aaaai.org 2. Food Allergy Research and Education (FARE): foodallergy.org 3. Mothers of Asthmatics: http://www.asthmacommunitynetwork.org 4. American College of Allergy, Asthma, and Immunology: www.acaai.org

## 2016-02-24 NOTE — Progress Notes (Signed)
FOLLOW UP  Date of Service/Encounter:  02/24/16   Assessment:   Chronic nonseasonal allergic rhinitis due to fungal spores  Lactose intolerance  GERD   Plan/Recommendations:   1. Chronic nonseasonal allergic rhinitis due to fungal spores - Continue with allergy shots at the same schedule. - Continue with Astelin and cetirizine as needed.  2. Lactose intolerance - We will get blood work to rule out a milk allergy. - Continue with the Lactaid for now since this seems to be working or at least improving her symptoms.   3. GERD - Continue with the Nexium daily.   4. Return in about 1 year (around 02/23/2017).  Subjective:   Jordan Mahoney is a 66 y.o. female presenting today for follow up of  Chief Complaint  Patient presents with  . Allergic Rhinitis     Follow up    Jordan Mahoney has a history of the following: Patient Active Problem List   Diagnosis Date Noted  . Allergic rhinitis 11/07/2014  . Headache, migraine 03/27/2014  . Migraine 12/11/2013  . Numbness 12/11/2013  . Urinary incontinence 12/11/2013  . HA (headache)     History obtained from: chart review and patient.  Jordan Mahoney was referred by Barry Dienes, NP.     Jordan Mahoney is a 66 y.o. female presenting for a follow up visit. She was last seen in July 2017 by Dr. Ishmael Holter, who has since left the practice. At that time, she was continued on her immunotherapy as well as her Astelin nasal spray and cetirizine '10mg'$  daily. She is also on Nasonex one spray per nostril daily.   Since the last visit, she has not done well. According to the patient, we prescribed the generic at the last refill which does not seem to be working at all. However this was mostl likely due to her insurance formularies rather than our doing. In any case, she has been on the generic for one month and continues to have postnasal drip and sneezing. She was doing much better on the brand name and would like [place] to help with getting the brand  name covered. She has failed fluticasone and Flonase which led to emesis due to the floral scent. She has tried and failed Nasacort in the past as well, both generic and the brand name. Astelin alone is not cutting it.  Allergy shots are going well. She does have a large local reaction to the shots especially during the mold season. She has been on shots for two years. This in combination with the Astelin and Nasonex seems to control her symptoms well. She does use an antihistamine as needed for breakthrough symptoms.   She does have lactose intolerance with diarrhea and stomach pain. This became particularly bad over the holiday and even had reactions when she had some in her tea. She has never had hives, wheezing, or swelling secondary to ingestion of cow's milk. She has never needed to go the ED or needed epinephrine. She now does fine on the Lactaid but still does not feel 100%. She has never been tested for food allergies.   Otherwise, there have been no changes to her past medical history, surgical history, family history, or social history.    Review of Systems: a 14-point review of systems is pertinent for what is mentioned in HPI.  Otherwise, all other systems were negative. Constitutional: negative other than that listed in the HPI Eyes: negative other than that listed in the HPI Ears, nose, mouth, throat, and  face: negative other than that listed in the HPI Respiratory: negative other than that listed in the HPI Cardiovascular: negative other than that listed in the HPI Gastrointestinal: negative other than that listed in the HPI Genitourinary: negative other than that listed in the HPI Integument: negative other than that listed in the HPI Hematologic: negative other than that listed in the HPI Musculoskeletal: negative other than that listed in the HPI Neurological: negative other than that listed in the HPI Allergy/Immunologic: negative other than that listed in the  HPI    Objective:   Blood pressure 140/82, pulse (!) 55, temperature 98.2 F (36.8 C), temperature source Oral, resp. rate 16, height 5' 5.5" (1.664 m), weight 180 lb 9.6 oz (81.9 kg), SpO2 96 %. Body mass index is 29.6 kg/m.   Physical Exam:  General: Alert, interactive, in no acute distress. Cooperative with the exam. Sniffling throughout the exam.  Eyes: No conjunctival injection present on the right, No conjunctival injection present on the left, PERRL bilaterally, No discharge on the right, No discharge on the left and No Horner-Trantas dots present Ears: Right TM pearly gray with normal light reflex, Left TM pearly gray with normal light reflex, Right TM intact without perforation and Left TM intact without perforation.  Nose/Throat: External nose within normal limits, nasal crease present and septum midline, turbinates edematous and pale with clear discharge, post-pharynx erythematous with cobblestoning in the posterior oropharynx. Tonsils 2+ without exudates Neck: Supple without thyromegaly. Lungs: Clear to auscultation without wheezing, rhonchi or rales. No increased work of breathing. CV: Normal S1/S2, no murmurs. Capillary refill <2 seconds.  Skin: Warm and dry, without lesions or rashes. Neuro:   Grossly intact. No focal deficits appreciated. Responsive to questions.   Diagnostic studies: None    Salvatore Marvel, MD [facility]

## 2016-02-24 NOTE — Addendum Note (Signed)
Addended by: Felipa Emory on: 02/24/2016 03:20 PM   Modules accepted: Orders

## 2016-03-02 ENCOUNTER — Ambulatory Visit (INDEPENDENT_AMBULATORY_CARE_PROVIDER_SITE_OTHER): Admitting: *Deleted

## 2016-03-02 DIAGNOSIS — J309 Allergic rhinitis, unspecified: Secondary | ICD-10-CM | POA: Diagnosis not present

## 2016-03-09 ENCOUNTER — Ambulatory Visit (INDEPENDENT_AMBULATORY_CARE_PROVIDER_SITE_OTHER): Admitting: *Deleted

## 2016-03-09 DIAGNOSIS — J309 Allergic rhinitis, unspecified: Secondary | ICD-10-CM

## 2016-03-10 ENCOUNTER — Other Ambulatory Visit: Payer: Self-pay

## 2016-03-10 MED ORDER — MOMETASONE FUROATE 50 MCG/ACT NA SUSP
2.0000 | Freq: Every day | NASAL | 1 refills | Status: DC
Start: 2016-03-10 — End: 2016-07-13

## 2016-03-23 ENCOUNTER — Ambulatory Visit (INDEPENDENT_AMBULATORY_CARE_PROVIDER_SITE_OTHER): Admitting: *Deleted

## 2016-03-23 DIAGNOSIS — J309 Allergic rhinitis, unspecified: Secondary | ICD-10-CM

## 2016-03-30 ENCOUNTER — Ambulatory Visit (INDEPENDENT_AMBULATORY_CARE_PROVIDER_SITE_OTHER): Admitting: *Deleted

## 2016-03-30 DIAGNOSIS — J309 Allergic rhinitis, unspecified: Secondary | ICD-10-CM

## 2016-04-06 ENCOUNTER — Ambulatory Visit (INDEPENDENT_AMBULATORY_CARE_PROVIDER_SITE_OTHER): Admitting: *Deleted

## 2016-04-06 DIAGNOSIS — J309 Allergic rhinitis, unspecified: Secondary | ICD-10-CM

## 2016-04-13 ENCOUNTER — Ambulatory Visit (INDEPENDENT_AMBULATORY_CARE_PROVIDER_SITE_OTHER): Admitting: Allergy & Immunology

## 2016-04-13 ENCOUNTER — Encounter: Payer: Self-pay | Admitting: Allergy & Immunology

## 2016-04-13 VITALS — BP 122/78 | HR 72 | Resp 16

## 2016-04-13 DIAGNOSIS — J309 Allergic rhinitis, unspecified: Secondary | ICD-10-CM | POA: Diagnosis not present

## 2016-04-13 DIAGNOSIS — K219 Gastro-esophageal reflux disease without esophagitis: Secondary | ICD-10-CM

## 2016-04-13 DIAGNOSIS — E739 Lactose intolerance, unspecified: Secondary | ICD-10-CM

## 2016-04-13 DIAGNOSIS — J3089 Other allergic rhinitis: Secondary | ICD-10-CM | POA: Diagnosis not present

## 2016-04-13 NOTE — Progress Notes (Signed)
FOLLOW UP  Date of Service/Encounter:  04/13/16   Assessment:   Lactose intolerance  Gastroesophageal reflux disease  Chronic nonseasonal allergic rhinitis    Plan/Recommendations:   1. Chronic nonseasonal allergic rhinitis  - Continue with allergy shots at the same schedule. - Continue with Astelin and cetirizine as needed. - Continue with Nasonex daily (forms filled out)  2. Lactose intolerance - Continue to use Lactaid milk. - You can try taking Lactaid pills before you eat cheese or yogurt to see if this helps.   3. GERD - Continue with Nexium for now. - Avoid your food allergies.   4. Return in about 6 months (around 10/11/2016).   Subjective:   Jordan Mahoney is a 66 y.o. female presenting today for follow up of  Chief Complaint  Patient presents with  . Allergic Rhinitis     needs new medication for allergy med; refill on azelastine    Jordan Mahoney has a history of the following: Patient Active Problem List   Diagnosis Date Noted  . Allergic rhinitis 11/07/2014  . Headache, migraine 03/27/2014  . Migraine 12/11/2013  . Numbness 12/11/2013  . Urinary incontinence 12/11/2013  . HA (headache)     History obtained from: chart review and patient.  Claudette Head Mahoney was referred by Barry Dienes, NP.     Jordan Mahoney is a 66 y.o. female presenting for a follow up visit. She was last seen in January 2018 for a regular follow up. At that time, she was doing well on her allergy shots as well as Astelin and cetirizine. She was concerned with a milk allergy therefore we sent a milk IgE. However the story was more consistent with lactose intolerance. We continued her on Nexium daily for her GERD.  Since the last visit, she has had problems with her allergy medications. She has failed Flonase and fluticasone. She has tried multiple other nasal sprays without improvement. She remains on Astelin. She was recently changed to [facility] which she feels has helped significantly.  Since running out of the Nasonex, she does have a drippy nose with a cough. This has been ongoing for one week. She has had no sinus pressure and denies sore throat. She has been afebrile.   Otherwise, there have been no changes to her past medical history, surgical history, family history, or social history.     Review of Systems: a 14-point review of systems is pertinent for what is mentioned in HPI.  Otherwise, all other systems were negative. Constitutional: negative other than that listed in the HPI Eyes: negative other than that listed in the HPI Ears, nose, mouth, throat, and face: negative other than that listed in the HPI Respiratory: negative other than that listed in the HPI Cardiovascular: negative other than that listed in the HPI Gastrointestinal: negative other than that listed in the HPI Genitourinary: negative other than that listed in the HPI Integument: negative other than that listed in the HPI Hematologic: negative other than that listed in the HPI Musculoskeletal: negative other than that listed in the HPI Neurological: negative other than that listed in the HPI Allergy/Immunologic: negative other than that listed in the HPI    Objective:   Blood pressure 122/78, pulse 72, resp. rate 16. There is no height or weight on file to calculate BMI.   Physical Exam:  General: Alert, interactive, in no acute distress. Cooperative with the exam. Fairly quiet.  Eyes: No conjunctival injection present on the right, No conjunctival injection present on the left,  PERRL bilaterally, No discharge on the right, No discharge on the left and No Horner-Trantas dots present Ears: Right TM pearly gray with normal light reflex, Left TM pearly gray with normal light reflex, Right TM intact without perforation and Left TM intact without perforation.  Nose/Throat: External nose within normal limits, nasal crease present and septum midline, turbinates edematous and pale with clear  discharge, post-pharynx erythematous with cobblestoning in the posterior oropharynx. Tonsils 2+ without exudates Neck: Supple without thyromegaly. Lungs: Clear to auscultation without wheezing, rhonchi or rales. No increased work of breathing. CV: Normal S1/S2, no murmurs. Capillary refill <2 seconds.  Skin: Warm and dry, without lesions or rashes. Neuro:   Grossly intact. No focal deficits appreciated. Responsive to questions.   Diagnostic studies: None    Jordan Marvel, MD [facility]

## 2016-04-13 NOTE — Patient Instructions (Addendum)
1. Chronic nonseasonal allergic rhinitis  - Continue with allergy shots at the same schedule. - Continue with Astelin and cetirizine as needed. - Continue with Nasonex daily (forms filled out)  2. Lactose intolerance - Continue to use Lactaid milk. - You can try taking Lactaid pills before you eat cheese or yogurt to see if this helps.   3. GERD - Continue with Nexium for now. - Avoid your food allergies.   4. Return in about 6 months (around 10/11/2016).  Please inform us of any Emergency Department visits, hospitalizations, or changes in symptoms. Call us before going to the ED for breathing or allergy symptoms since we might be able to fit you in for a sick visit. Feel free to contact us anytime with any questions, problems, or concerns.  It was a pleasure to see you again today!   Websites that have reliable patient information: 1. American Academy of Asthma, Allergy, and Immunology: www.aaaai.org 2. Food Allergy Research and Education (FARE): foodallergy.org 3. Mothers of Asthmatics: http://www.asthmacommunitynetwork.org 4. American College of Allergy, Asthma, and Immunology: www.acaai.org

## 2016-04-20 ENCOUNTER — Ambulatory Visit (INDEPENDENT_AMBULATORY_CARE_PROVIDER_SITE_OTHER): Admitting: *Deleted

## 2016-04-20 DIAGNOSIS — J309 Allergic rhinitis, unspecified: Secondary | ICD-10-CM | POA: Diagnosis not present

## 2016-05-04 ENCOUNTER — Ambulatory Visit (INDEPENDENT_AMBULATORY_CARE_PROVIDER_SITE_OTHER): Admitting: *Deleted

## 2016-05-04 ENCOUNTER — Telehealth: Payer: Self-pay

## 2016-05-04 DIAGNOSIS — J309 Allergic rhinitis, unspecified: Secondary | ICD-10-CM

## 2016-05-04 NOTE — Telephone Encounter (Signed)
Error

## 2016-05-11 ENCOUNTER — Ambulatory Visit (INDEPENDENT_AMBULATORY_CARE_PROVIDER_SITE_OTHER): Admitting: *Deleted

## 2016-05-11 DIAGNOSIS — J309 Allergic rhinitis, unspecified: Secondary | ICD-10-CM

## 2016-05-18 ENCOUNTER — Ambulatory Visit (INDEPENDENT_AMBULATORY_CARE_PROVIDER_SITE_OTHER): Admitting: Allergy & Immunology

## 2016-05-18 ENCOUNTER — Encounter: Payer: Self-pay | Admitting: Allergy & Immunology

## 2016-05-18 VITALS — BP 132/72 | HR 69 | Temp 98.1°F | Resp 14 | Ht 65.55 in | Wt 176.6 lb

## 2016-05-18 DIAGNOSIS — E739 Lactose intolerance, unspecified: Secondary | ICD-10-CM | POA: Diagnosis not present

## 2016-05-18 DIAGNOSIS — K219 Gastro-esophageal reflux disease without esophagitis: Secondary | ICD-10-CM

## 2016-05-18 DIAGNOSIS — J3089 Other allergic rhinitis: Secondary | ICD-10-CM

## 2016-05-18 MED ORDER — FEXOFENADINE HCL 180 MG PO TABS: 180.0000 mg | ORAL_TABLET | Freq: Every day | ORAL | 1 refills | Status: DC

## 2016-05-18 MED ORDER — AZELASTINE HCL 0.1 % NA SOLN: 2.0000 | Freq: Every day | NASAL | 1 refills | Status: DC

## 2016-05-18 NOTE — Patient Instructions (Addendum)
1. Chronic nonseasonal allergic rhinitis  - Continue with allergy shots at the same schedule. - Continue with Astelin two sprays per nostril once daily. - Continue with Allegra '180mg'$  twice daily.  - Continue with Nasonex daily.  - Try getting Rhinocort over the counter to see if this helps at all. - We wrote a letter to [employer] and we will send this in for you.     2. Lactose intolerance - Continue to use Lactaid milk.  3. GERD - Continue with Nexium for now. - Avoid your food allergies.   4. Return in about 6 months (around 11/17/2016).  Please inform us of any Emergency Department visits, hospitalizations, or changes in symptoms. Call us before going to the ED for breathing or allergy symptoms since we might be able to fit you in for a sick visit. Feel free to contact us anytime with any questions, problems, or concerns.  It was a pleasure to see you again today! Happy spring!   Websites that have reliable patient information: 1. American Academy of Asthma, Allergy, and Immunology: www.aaaai.org 2. Food Allergy Research and Education (FARE): foodallergy.org 3. Mothers of Asthmatics: http://www.asthmacommunitynetwork.org 4. American College of Allergy, Asthma, and Immunology: www.acaai.org

## 2016-05-18 NOTE — Progress Notes (Signed)
FOLLOW UP  Date of Service/Encounter:  05/18/16   Assessment:   Gastroesophageal reflux disease  Chronic nonseasonal allergic rhinitis  Lactose intolerance   Plan/Recommendations:   1. Chronic nonseasonal allergic rhinitis  - Continue with allergy shots at the same schedule. - Continue with Astelin two sprays per nostril once daily. - Continue with Allegra '180mg'$  twice daily.  - Continue with Nasonex daily.  - Try getting Rhinocort over the counter to see if this helps at all. - We wrote a letter to [employer] and we will send this in for you.   2. Lactose intolerance - Continue to use Lactaid milk.  3. GERD - Continue with Nexium for now. - Avoid your food allergies.   4. Return in about 6 months (around 11/17/2016).   Subjective:   Jordan Mahoney is a 66 y.o. female presenting today for follow up of  Chief Complaint  Patient presents with  . Allergies  . Medication Problem    Jordan Mahoney has a history of the following: Patient Active Problem List   Diagnosis Date Noted  . Allergic rhinitis 11/07/2014  . Headache, migraine 03/27/2014  . Migraine 12/11/2013  . Numbness 12/11/2013  . Urinary incontinence 12/11/2013  . HA (headache)     History obtained from: chart review and patient.  Claudette Head Lantry was referred by Barry Dienes, NP.     Jordan Mahoney is a 66 y.o. female presenting for a follow up visit. She has a history of chronic allergic rhinitis and is on allergy shots, Astelin 1 spray per nostril daily, and Nasonex one to 2 sprays per nostril daily. She takes Allegra for her antihistamine. She has been on allergy shots for quite a long time with our practice. She has a history of not tolerating any other nasal steroids or nasal sprays. The only nasal steroid that has worked for her has been brand name Nasonex. However, her insurance has not been willing to cover this. We have filled out multiple prior authorizations without success. The last paperwork refilled  out was in February 2018. Despite this, the coverage was declined.  She presents today because she needs a letter explaining in detail her reactions to nasal sprays as well as her clinical responses. This is the next step and obtaining a prior authorization for the Nasonex. Her allergy shots are going well. She is on schedule A, but due to follow-up she never seems to get to 0.5 mL before her vital is used up. She does occasionally have adverse reactions as well that necessitated decreasing her dose. She is currently taking the generic Nasonex, but has a very nasal voice with chronic nasal congestion.  Otherwise, there have been no changes to her past medical history, surgical history, family history, or social history.    Review of Systems: a 14-point review of systems is pertinent for what is mentioned in HPI.  Otherwise, all other systems were negative. Constitutional: negative other than that listed in the HPI Eyes: negative other than that listed in the HPI Ears, nose, mouth, throat, and face: negative other than that listed in the HPI Respiratory: negative other than that listed in the HPI Cardiovascular: negative other than that listed in the HPI Gastrointestinal: negative other than that listed in the HPI Genitourinary: negative other than that listed in the HPI Integument: negative other than that listed in the HPI Hematologic: negative other than that listed in the HPI Musculoskeletal: negative other than that listed in the HPI Neurological: negative other than that listed  in the HPI Allergy/Immunologic: negative other than that listed in the HPI    Objective:   Blood pressure 132/72, pulse 69, temperature 98.1 F (36.7 C), temperature source Oral, resp. rate 14, height 5' 5.55" (1.665 m), weight 176 lb 9.6 oz (80.1 kg), SpO2 96 %. Body mass index is 28.9 kg/m.   Physical Exam:  General: Alert, interactive, in no acute distress. Pleasant female.  Eyes: No conjunctival  injection present on the right, No conjunctival injection present on the left, PERRL bilaterally, No discharge on the right, No discharge on the left and No Horner-Trantas dots present Ears: Right TM pearly gray with normal light reflex, Left TM pearly gray with normal light reflex, Right TM intact without perforation and Left TM intact without perforation.  Nose/Throat: External nose within normal limits and septum midline, turbinates markedly edematous with clear discharge, post-pharynx markedly erythematous with cobblestoning in the posterior oropharynx. Tonsils 2+ without exudates Neck: Supple without thyromegaly. Lungs: Clear to auscultation without wheezing, rhonchi or rales. No increased work of breathing. CV: Normal S1/S2, no murmurs. Capillary refill <2 seconds.  Skin: Warm and dry, without lesions or rashes. Neuro:   Grossly intact. No focal deficits appreciated. Responsive to questions.   Diagnostic studies: none    Salvatore Marvel, MD [facility]

## 2016-05-25 ENCOUNTER — Ambulatory Visit (INDEPENDENT_AMBULATORY_CARE_PROVIDER_SITE_OTHER): Admitting: *Deleted

## 2016-05-25 DIAGNOSIS — J309 Allergic rhinitis, unspecified: Secondary | ICD-10-CM | POA: Diagnosis not present

## 2016-06-01 ENCOUNTER — Ambulatory Visit (INDEPENDENT_AMBULATORY_CARE_PROVIDER_SITE_OTHER): Admitting: *Deleted

## 2016-06-01 DIAGNOSIS — J309 Allergic rhinitis, unspecified: Secondary | ICD-10-CM

## 2016-06-08 ENCOUNTER — Ambulatory Visit (INDEPENDENT_AMBULATORY_CARE_PROVIDER_SITE_OTHER): Admitting: *Deleted

## 2016-06-08 DIAGNOSIS — J309 Allergic rhinitis, unspecified: Secondary | ICD-10-CM | POA: Diagnosis not present

## 2016-06-15 ENCOUNTER — Ambulatory Visit (INDEPENDENT_AMBULATORY_CARE_PROVIDER_SITE_OTHER): Admitting: *Deleted

## 2016-06-15 DIAGNOSIS — J309 Allergic rhinitis, unspecified: Secondary | ICD-10-CM

## 2016-06-22 ENCOUNTER — Ambulatory Visit (INDEPENDENT_AMBULATORY_CARE_PROVIDER_SITE_OTHER): Admitting: *Deleted

## 2016-06-22 DIAGNOSIS — J309 Allergic rhinitis, unspecified: Secondary | ICD-10-CM

## 2016-06-29 ENCOUNTER — Ambulatory Visit (INDEPENDENT_AMBULATORY_CARE_PROVIDER_SITE_OTHER): Admitting: *Deleted

## 2016-06-29 DIAGNOSIS — J309 Allergic rhinitis, unspecified: Secondary | ICD-10-CM

## 2016-07-06 ENCOUNTER — Ambulatory Visit (INDEPENDENT_AMBULATORY_CARE_PROVIDER_SITE_OTHER): Admitting: *Deleted

## 2016-07-06 DIAGNOSIS — J309 Allergic rhinitis, unspecified: Secondary | ICD-10-CM | POA: Diagnosis not present

## 2016-07-13 ENCOUNTER — Other Ambulatory Visit: Payer: Self-pay

## 2016-07-13 ENCOUNTER — Ambulatory Visit (INDEPENDENT_AMBULATORY_CARE_PROVIDER_SITE_OTHER): Admitting: *Deleted

## 2016-07-13 DIAGNOSIS — J309 Allergic rhinitis, unspecified: Secondary | ICD-10-CM

## 2016-07-13 MED ORDER — MOMETASONE FUROATE 50 MCG/ACT NA SUSP: 2.0000 | Freq: Every day | NASAL | 1 refills | Status: DC

## 2016-07-13 NOTE — Telephone Encounter (Signed)
I sent in new script for 90 day of Nasonex [facility].

## 2016-07-20 ENCOUNTER — Ambulatory Visit (INDEPENDENT_AMBULATORY_CARE_PROVIDER_SITE_OTHER): Admitting: *Deleted

## 2016-07-20 DIAGNOSIS — J309 Allergic rhinitis, unspecified: Secondary | ICD-10-CM | POA: Diagnosis not present

## 2016-07-27 ENCOUNTER — Ambulatory Visit (INDEPENDENT_AMBULATORY_CARE_PROVIDER_SITE_OTHER): Admitting: *Deleted

## 2016-07-27 DIAGNOSIS — J309 Allergic rhinitis, unspecified: Secondary | ICD-10-CM

## 2016-07-27 NOTE — Progress Notes (Signed)
Vials to be made 07-28-16  jm

## 2016-07-28 ENCOUNTER — Encounter: Payer: Self-pay | Admitting: Nurse Practitioner

## 2016-07-28 ENCOUNTER — Ambulatory Visit (INDEPENDENT_AMBULATORY_CARE_PROVIDER_SITE_OTHER): Admitting: Nurse Practitioner

## 2016-07-28 VITALS — BP 119/72 | HR 63 | Wt 174.6 lb

## 2016-07-28 DIAGNOSIS — G43909 Migraine, unspecified, not intractable, without status migrainosus: Secondary | ICD-10-CM

## 2016-07-28 MED ORDER — TOPIRAMATE 100 MG PO TABS: 100.0000 mg | ORAL_TABLET | Freq: Two times a day (BID) | ORAL | 3 refills | Status: DC

## 2016-07-28 MED ORDER — ELETRIPTAN HYDROBROMIDE 40 MG PO TABS: 40.0000 mg | ORAL_TABLET | ORAL | 2 refills | Status: DC | PRN

## 2016-07-28 NOTE — Progress Notes (Signed)
[facility]  PATIENT: Jordan Mahoney DOB: Apr 04, 1950   REASON FOR VISIT: Follow-up for migraine headaches HISTORY FROM: Patient    HISTORY OF PRESENT ILLNESS:Jordan Mahoney is 66 years old right-handed Caucasian female, accompanied by her husband, referred to primary care physician Dr. Kieth Brightly, and ENT Dr. Redmond Pulling for evaluation of frequent headaches She reported long history of headaches since teenager, it was typical migraine, holocranial severe pounding headaches, with associated light noise sensitivity, nauseous, can last few hours to days, over the years, she has been dealing with her migraine to variable degree, previously tried Imitrex, which does help her migraine, but causing palpitation She reported increased migraine since 2014, she had about once every month severe prolonged typical migraine headaches, in between, she also has almost daily low-grade pressure headache behind her eyes, with associated light noise smell sensitivity. 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 She also had a history of gastric bypass in 2010, initially with 100 pound loss, now dealing with her frequent headaches, left elbow pain, recent surgery for tendinitis, she has regained 40 pounds, She report 2 month history of urinary incontinence, since August 2015, she also noticed bilateral fingertips paresthesia, but denies significant neck pain, denies significant gait difficulty Laboratory evaluation in June 2015 showed normal CBC, ferritin 28.2, LDL 88, normal iron level, TSH 2.2 9, vitamin D 37.9, B12 570  UPDATE Dec 26 2013: She has tired Maxalt 5 mg, which has been helfpful, but she also has to take second dosage because headaches comes back a few hours later, she also complains of fatigue, jaw tightness after medications, she is on higher dose of Topamax 100 mg twice a day, complains of fingertips, toes  numbness, fatigue, Her headache overall has much improved, she has. In between, she is headache free, she denies gait difficulty, no significant neck pain, But she complains of acute onset urinary urgency, occasionally incontinence, Vesicare has been helpful, now is on generic oxybutynin,  We have reviewed MRI of the cervical spine, which has demonstrated multilevel degenerative disc disease, most severe at C5-6, C6 and 7, with mild-to-moderate canal stenosis, there was no cord signal change  UPDATE Feb 10th 2016: She is now tolerating Topamax 100mg  bid, still has mild fingertips paresthesia, she is also taking magnesium, and riboflavin, her headache are under much better control, couple times each months, she had her typical migraine headaches, Maxalta 5mg  prn, was helpful, but often has to take second dosage, trigger for her migraines are weather change, strong smell, certain food, wines, processed food,  UPDATE 09/25/2014: Jordan Mahoney, 66 year old female returns for follow-up. She has a history of migraines and her migraines have worsened in the last couple of weeks due to changes in the weather she claims. Her headache today is a 10 on the pain scale of 1-10. She is nauseated. She has photophobia. She remains on her Topamax as preventive. She did not take her Maxalt this morning. She returns for reevaluation UPDATE 12/26/2014 Jordan Mahoney, 66year-old female returns for follow-up. She has a history of migraine headaches. She is currently on Topamax 100 twice daily and her triptan has been changed to Relpax, works acutely for her. Her headaches are in good control. Her biggest triggers are weather changes, certain foods and strong perfumes. She tries to avoid these if at all possible. She returns for reevaluation UPDATE 06/12/2017CM Jordan Mahoney, 66 year old female returns for follow-up. She has a history of headaches/migraines and  was on Topamax when last seen however she went on vacation, forgot the  medication, and she has not resumed. She claims her headaches are in good control she takes Relpax acutely. Her biggest trigger  are weather changes and strong odors. She returns for reevaluation She has had abnormal liver function tests and is due for liver scan  tomorrow . UPDATE 12/18/2017CM Jordan Mahoney, 66 year old female returns for follow-up. She has a history of headaches and migraine and then last seen she was off of her Topamax and just using Relpax acutely. She called on November 7 saying her headaches have increased and she was placed back on Topamax 50 mg twice daily she had previously been on 100 mg twice daily. Her headaches have much improved on the lower dose and she still takes Relpax acutely. For the month of November she had 14 headaches and for the month of December 3 headaches so far. Recent DEXA scan showed osteoporosis and she is due to get Relcast yearly. She returns for reevaluation UPDATE 06/13/2018CM Jordan Mahoney, 66-year-old female returns for follow-up with a long history of headaches. She is currently on Topamax 100 twice daily with improvement in the number of headaches. She has tried to taper off of Topamax in the past but her headaches returned. She does not have a headache every day but weather changes and strong odors and wine are big migraine triggers for her. Relpax continues to work acutely. She has not kept a record of her headaches. She continues to take magnesium 400 mg daily and she takes Flexeril 10 mg when necessary.She returns for reevaluation REVIEW OF SYSTEMS: Full 14 system review of systems performed and notable only for those listed, all others are neg:  Constitutional: neg  Cardiovascular: neg Ear/Nose/Throat: neg  Skin: neg Eyes: neg Respiratory: neg Gastroitestinal: neg  Hematology/Lymphatic: neg  Endocrine: neg Musculoskeletal:neg Allergy/Immunology: neg Neurological: History of migraines currently well-controlled Psychiatric: neg Sleep :  neg   ALLERGIES: Allergies  Allergen Reactions  . Latex   . Sulfa Antibiotics Other (See Comments)    Causes skin reaction ( on her mid back)  Pigmentation.   . Valium [Diazepam]     HOME MEDICATIONS: Outpatient Medications Prior to Visit  Medication Sig Dispense Refill  . azelastine (ASTELIN) 0.1 % nasal spray Place 2 sprays into both nostrils daily. Use in each nostril as directed 90 mL 1  . azelastine (OPTIVAR) 0.05 % ophthalmic solution 1 drop 2 (two) times daily. Reported on 08/26/2015    . calcium carbonate (TUMS - DOSED IN MG ELEMENTAL CALCIUM) 500 MG chewable tablet Chew 1 tablet by mouth 3 (three) times daily.    [name] CALCIUM CITRATE PO Take 500 mg by mouth 3 (three) times daily.    . Calcium Citrate-Vitamin D ([employer] CALCIUM CITRATE +D3 MINI) 200-250 MG-UNIT TABS Take by mouth.    . cetirizine (ZYRTEC) 10 MG tablet 1 tablet daily for runny nose or itching. 90 tablet 1  . cyclobenzaprine (FLEXERIL) 10 MG tablet Take 10 mg by mouth as needed.    . dorzolamide (TRUSOPT) 2 % ophthalmic solution     . eletriptan (RELPAX) 40 MG tablet Take 1 tablet (40 mg total) by mouth as needed for migraine or headache. May repeat in 2 hours if headache persists or recurs. 27 tablet 2  . EPINEPHrine (EPIPEN 2-PAK) 0.3 mg/0.3 mL IJ SOAJ injection Inject 0.3 mg into the muscle as needed (for severe life-threatening allergic reaction).    . Ergocalciferol (VITAMIN D2) 2000 UNITS  TABS Take by mouth daily.    [name] esomeprazole (NEXIUM) 20 MG capsule Take 20 mg by mouth 2 (two) times daily before a meal.    . fexofenadine (ALLEGRA) 180 MG tablet Take 1 tablet (180 mg total) by mouth daily. 90 tablet 1  . FLUCELVAX QUADRIVALENT 0.5 ML SUSY Inject 0.5 mg into the muscle as directed.    [name] LOTEMAX 0.5 % ophthalmic suspension     . Magnesium 400 MG CAPS Take 400 mg by mouth daily.    . mometasone (NASONEX) 50 MCG/ACT nasal spray Place 2 sprays into the nose daily. Two sprays each in each nostril 51 g 1  . Multiple  Vitamin (MULTIVITAMIN) tablet Take 1 tablet by mouth daily.    [name] omeprazole (PRILOSEC) 40 MG capsule     . oxyCODONE-acetaminophen (PERCOCET/ROXICET) 5-325 MG tablet     . Riboflavin 100 MG CAPS Take 100 mg by mouth 2 (two) times daily.    . solifenacin (VESICARE) 10 MG tablet Take 10 mg by mouth daily.    . timolol (TIMOPTIC) 0.5 % ophthalmic solution     . topiramate (TOPAMAX) 100 MG tablet Take 1 tablet (100 mg total) by mouth 2 (two) times daily. 180 tablet 2  . vitamin C (ASCORBIC ACID) 500 MG tablet Take 500 mg by mouth 2 (two) times daily.    [name] zolpidem (AMBIEN) 10 MG tablet Take 10 mg by mouth as needed.    . Magnesium Oxide 400 MG CAPS Take by mouth.    [name] ZOSTAVAX 17510 UNT/0.65ML injection Reported on 03/04/2015     No facility-administered medications prior to visit.     PAST MEDICAL HISTORY: Past Medical History:  Diagnosis Date  . Allergic rhinoconjunctivitis   . HA (headache)     PAST SURGICAL HISTORY: Past Surgical History:  Procedure Laterality Date  . belly button    . c sections    . CATARACT EXTRACTION Right   . ELBOW SURGERY Left   . GANGLION CYST EXCISION Left 01/22/2016   base of thumb  . WRIST SURGERY Right     FAMILY HISTORY: Family History  Problem Relation Age of Onset  . Diabetes Father   . Migraines Daughter   . Allergic rhinitis Neg Hx   . Angioedema Neg Hx   . Asthma Neg Hx   . Eczema Neg Hx   . Immunodeficiency Neg Hx   . Urticaria Neg Hx     SOCIAL HISTORY: Social History   Social History  . Marital status: Unknown    Spouse name: N/A  . Number of children: 2  . Years of education: college   Occupational History  .      retired   Social History Main Topics  . Smoking status: Former Research scientist (life sciences)  . Smokeless tobacco: Never Used     Comment: Quit in 1985  . Alcohol use 0.6 oz/week    1 Cans of beer per week     Comment: Rare   . Drug use: No  . Sexual activity: Not on file   Other Topics Concern  . Not on file   Social  History Narrative   Patient lives at home with her husband [name]).    Education two years of college.   Caffeine two glasses of tea.     PHYSICAL EXAM  Vitals:   07/28/16 0953  BP: 119/72  Pulse: 63  Weight: 174 lb 9.6 oz (79.2 kg)   Body mass index is 28.57 kg/m. Generalized: In  no acute distress Neck: Supple,  Musculoskeletal: No deformity  Neurological examination Mentation: Alert oriented to time, place, history taking, and causual conversation  Cranial nerve II-XII: PERL. Extraocular movements were full. Visual field were full on confrontational test.  Facial sensation and strength were normal. Hearing was intact to finger rubbing bilaterally. Uvula tongue midline. Head turning and shoulder shrug and were normal and symmetric.Tongue protrusion into cheek strength was normal. Motor: Normal tone, bulk and strength, except limited range of motion of left elbow, Sensory: Intact to fine touch, pinprick, preserved vibratory sensation, in the upper and lower extremities  Coordination: Normal finger to nose, heel-to-shin bilaterally  Gait: Ambulated short distance, can heel toe and tandem without difficulty. No assistive device Deep tendon reflexes: Brachioradialis 2/2, biceps 2/2, triceps 2/2, patellar 3/3 , Achilles 2/2, plantar responses were flexor bilaterally  DIAGNOSTIC DATA (LABS, IMAGING, TESTING)  ASSESSMENT AND PLAN  66 y.o. year old female  has a past medical history of Allergic rhinoconjunctivitis and HA (headache). here to follow-up  For her migraine headaches which are in fairly good control with Topamax. Relpax works acutely. She tried to taper off of Topamax in June of last year but the headaches return   PLANContinue Relpax acutely will refill Continue to avoid migraine triggers Continue Topamax 100mg  twice daily will refill Continue magnesium 400 mg daily Continue Flexeril when necessary Keep a record of your headaches, migraine tracker Call for  worsening headaches Follow-up  In 6  To 8 monthsmonths Dennie Bible, [facility], [facility], [facility] [address], [place], [place] [address] [phone]

## 2016-07-28 NOTE — Patient Instructions (Signed)
Continue Relpax acutely will refill Continue to avoid migraine triggers Continue Topamax 100mg  twice daily Keep a record of your headaches, migraine tracker Call for worsening headaches Follow-up  In 6  To 8 monthsmonths

## 2016-07-29 DIAGNOSIS — J3089 Other allergic rhinitis: Secondary | ICD-10-CM | POA: Diagnosis not present

## 2016-07-30 NOTE — Progress Notes (Signed)
I have reviewed and agreed above plan. 

## 2016-08-03 ENCOUNTER — Ambulatory Visit (INDEPENDENT_AMBULATORY_CARE_PROVIDER_SITE_OTHER): Admitting: *Deleted

## 2016-08-03 DIAGNOSIS — J309 Allergic rhinitis, unspecified: Secondary | ICD-10-CM | POA: Diagnosis not present

## 2016-08-10 ENCOUNTER — Ambulatory Visit (INDEPENDENT_AMBULATORY_CARE_PROVIDER_SITE_OTHER): Admitting: *Deleted

## 2016-08-10 DIAGNOSIS — J309 Allergic rhinitis, unspecified: Secondary | ICD-10-CM | POA: Diagnosis not present

## 2016-08-17 ENCOUNTER — Ambulatory Visit (INDEPENDENT_AMBULATORY_CARE_PROVIDER_SITE_OTHER)

## 2016-08-17 DIAGNOSIS — J309 Allergic rhinitis, unspecified: Secondary | ICD-10-CM | POA: Diagnosis not present

## 2016-08-24 ENCOUNTER — Ambulatory Visit (INDEPENDENT_AMBULATORY_CARE_PROVIDER_SITE_OTHER): Admitting: *Deleted

## 2016-08-24 DIAGNOSIS — J309 Allergic rhinitis, unspecified: Secondary | ICD-10-CM

## 2016-08-31 ENCOUNTER — Ambulatory Visit (INDEPENDENT_AMBULATORY_CARE_PROVIDER_SITE_OTHER): Admitting: *Deleted

## 2016-08-31 DIAGNOSIS — J309 Allergic rhinitis, unspecified: Secondary | ICD-10-CM | POA: Diagnosis not present

## 2016-09-07 ENCOUNTER — Ambulatory Visit (INDEPENDENT_AMBULATORY_CARE_PROVIDER_SITE_OTHER): Admitting: *Deleted

## 2016-09-07 DIAGNOSIS — J309 Allergic rhinitis, unspecified: Secondary | ICD-10-CM

## 2016-09-14 ENCOUNTER — Ambulatory Visit (INDEPENDENT_AMBULATORY_CARE_PROVIDER_SITE_OTHER)

## 2016-09-14 DIAGNOSIS — J309 Allergic rhinitis, unspecified: Secondary | ICD-10-CM

## 2016-09-21 ENCOUNTER — Ambulatory Visit (INDEPENDENT_AMBULATORY_CARE_PROVIDER_SITE_OTHER): Admitting: *Deleted

## 2016-09-21 DIAGNOSIS — J309 Allergic rhinitis, unspecified: Secondary | ICD-10-CM

## 2016-09-28 ENCOUNTER — Ambulatory Visit (INDEPENDENT_AMBULATORY_CARE_PROVIDER_SITE_OTHER): Admitting: *Deleted

## 2016-09-28 DIAGNOSIS — J309 Allergic rhinitis, unspecified: Secondary | ICD-10-CM

## 2016-10-05 ENCOUNTER — Ambulatory Visit (INDEPENDENT_AMBULATORY_CARE_PROVIDER_SITE_OTHER): Admitting: *Deleted

## 2016-10-05 DIAGNOSIS — J309 Allergic rhinitis, unspecified: Secondary | ICD-10-CM

## 2016-10-12 ENCOUNTER — Ambulatory Visit (INDEPENDENT_AMBULATORY_CARE_PROVIDER_SITE_OTHER): Admitting: *Deleted

## 2016-10-12 DIAGNOSIS — J309 Allergic rhinitis, unspecified: Secondary | ICD-10-CM | POA: Diagnosis not present

## 2016-10-19 ENCOUNTER — Ambulatory Visit (INDEPENDENT_AMBULATORY_CARE_PROVIDER_SITE_OTHER): Admitting: *Deleted

## 2016-10-19 DIAGNOSIS — J309 Allergic rhinitis, unspecified: Secondary | ICD-10-CM

## 2016-10-26 ENCOUNTER — Ambulatory Visit (INDEPENDENT_AMBULATORY_CARE_PROVIDER_SITE_OTHER): Admitting: *Deleted

## 2016-10-26 DIAGNOSIS — J309 Allergic rhinitis, unspecified: Secondary | ICD-10-CM

## 2016-10-28 DIAGNOSIS — J3089 Other allergic rhinitis: Secondary | ICD-10-CM | POA: Diagnosis not present

## 2016-10-28 NOTE — Progress Notes (Signed)
VIAL EXP 10-28-17

## 2016-11-02 ENCOUNTER — Ambulatory Visit (INDEPENDENT_AMBULATORY_CARE_PROVIDER_SITE_OTHER): Admitting: *Deleted

## 2016-11-02 DIAGNOSIS — J309 Allergic rhinitis, unspecified: Secondary | ICD-10-CM | POA: Diagnosis not present

## 2016-11-09 ENCOUNTER — Ambulatory Visit (INDEPENDENT_AMBULATORY_CARE_PROVIDER_SITE_OTHER): Admitting: *Deleted

## 2016-11-09 DIAGNOSIS — J309 Allergic rhinitis, unspecified: Secondary | ICD-10-CM

## 2016-11-16 ENCOUNTER — Ambulatory Visit (INDEPENDENT_AMBULATORY_CARE_PROVIDER_SITE_OTHER)

## 2016-11-16 DIAGNOSIS — J309 Allergic rhinitis, unspecified: Secondary | ICD-10-CM | POA: Diagnosis not present

## 2016-11-23 ENCOUNTER — Ambulatory Visit (INDEPENDENT_AMBULATORY_CARE_PROVIDER_SITE_OTHER)

## 2016-11-23 DIAGNOSIS — J309 Allergic rhinitis, unspecified: Secondary | ICD-10-CM | POA: Diagnosis not present

## 2016-11-30 ENCOUNTER — Ambulatory Visit (INDEPENDENT_AMBULATORY_CARE_PROVIDER_SITE_OTHER): Admitting: *Deleted

## 2016-11-30 DIAGNOSIS — J309 Allergic rhinitis, unspecified: Secondary | ICD-10-CM

## 2016-12-07 ENCOUNTER — Ambulatory Visit (INDEPENDENT_AMBULATORY_CARE_PROVIDER_SITE_OTHER): Admitting: *Deleted

## 2016-12-07 DIAGNOSIS — J309 Allergic rhinitis, unspecified: Secondary | ICD-10-CM

## 2016-12-21 ENCOUNTER — Ambulatory Visit (INDEPENDENT_AMBULATORY_CARE_PROVIDER_SITE_OTHER): Admitting: *Deleted

## 2016-12-21 DIAGNOSIS — J309 Allergic rhinitis, unspecified: Secondary | ICD-10-CM

## 2017-01-04 ENCOUNTER — Ambulatory Visit (INDEPENDENT_AMBULATORY_CARE_PROVIDER_SITE_OTHER): Admitting: *Deleted

## 2017-01-04 DIAGNOSIS — J309 Allergic rhinitis, unspecified: Secondary | ICD-10-CM | POA: Diagnosis not present

## 2017-01-18 ENCOUNTER — Ambulatory Visit (INDEPENDENT_AMBULATORY_CARE_PROVIDER_SITE_OTHER)

## 2017-01-18 DIAGNOSIS — J309 Allergic rhinitis, unspecified: Secondary | ICD-10-CM | POA: Diagnosis not present

## 2017-01-26 NOTE — Progress Notes (Signed)
[facility]  PATIENT: Jordan Mahoney DOB: [birth year]   REASON FOR VISIT: Follow-up for migraine headaches HISTORY FROM: Patient    HISTORY OF PRESENT ILLNESS:Jordan Mahoney is 66 years old right-handed Caucasian female, accompanied by her husband, referred to primary care physician Dr. Kieth Brightly, and ENT Dr. Redmond Pulling for evaluation of frequent headaches She reported long history of headaches since teenager, it was typical migraine, holocranial severe pounding headaches, with associated light noise sensitivity, nauseous, can last few hours to days, over the years, she has been dealing with her migraine to variable degree, previously tried Imitrex, which does help her migraine, but causing palpitation She reported increased migraine since 2014, she had about once every month severe prolonged typical migraine headaches, in between, she also has almost daily low-grade pressure headache behind her eyes, with associated light noise smell sensitivity. 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 She also had a history of gastric bypass in 2010, initially with 100 pound loss, now dealing with her frequent headaches, left elbow pain, recent surgery for tendinitis, she has regained 40 pounds, She report 2 month history of urinary incontinence, since August 2015, she also noticed bilateral fingertips paresthesia, but denies significant neck pain, denies significant gait difficulty Laboratory evaluation in June 2015 showed normal CBC, ferritin 28.2, LDL 88, normal iron level, TSH 2.2 9, vitamin D 37.9, B12 570  UPDATE Dec 26 2013: She has tired Maxalt 5 mg, which has been helfpful, but she also has to take second dosage because headaches comes back a few hours later, she also complains of fatigue, jaw tightness after medications, she is on higher dose of Topamax 100 mg twice a day, complains of fingertips, toes  numbness, fatigue, Her headache overall has much improved, she has. In between, she is headache free, she denies gait difficulty, no significant neck pain, But she complains of acute onset urinary urgency, occasionally incontinence, Vesicare has been helpful, now is on generic oxybutynin,  We have reviewed MRI of the cervical spine, which has demonstrated multilevel degenerative disc disease, most severe at C5-6, C6 and 7, with mild-to-moderate canal stenosis, there was no cord signal change  UPDATE Feb 10th 2016: She is now tolerating Topamax 100mg  bid, still has mild fingertips paresthesia, she is also taking magnesium, and riboflavin, her headache are under much better control, couple times each months, she had her typical migraine headaches, Maxalta 5mg  prn, was helpful, but often has to take second dosage, trigger for her migraines are weather change, strong smell, certain food, wines, processed food,  UPDATE 09/25/2014: Jordan Mahoney, 66 year old female returns for follow-up. She has a history of migraines and her migraines have worsened in the last couple of weeks due to changes in the weather she claims. Her headache today is a 10 on the pain scale of 1-10. She is nauseated. She has photophobia. She remains on her Topamax as preventive. She did not take her Maxalt this morning. She returns for reevaluation UPDATE 12/26/2014 Jordan Mahoney, 66year-old female returns for follow-up. She has a history of migraine headaches. She is currently on Topamax 100 twice daily and her triptan has been changed to Relpax, works acutely for her. Her headaches are in good control. Her biggest triggers are weather changes, certain foods and strong perfumes. She tries to avoid these if at all possible. She returns for reevaluation UPDATE 06/12/2017CM Jordan Mahoney, 66 year old female returns for follow-up. She has a history of headaches/migraines and  was on Topamax when last seen however she went on vacation, forgot the  medication, and she has not resumed. She claims her headaches are in good control she takes Relpax acutely. Her biggest trigger  are weather changes and strong odors. She returns for reevaluation She has had abnormal liver function tests and is due for liver scan  tomorrow . UPDATE 12/18/2017CM Jordan Mahoney, 66 year old female returns for follow-up. She has a history of headaches and migraine and then last seen she was off of her Topamax and just using Relpax acutely. She called on November 7 saying her headaches have increased and she was placed back on Topamax 50 mg twice daily she had previously been on 100 mg twice daily. Her headaches have much improved on the lower dose and she still takes Relpax acutely. For the month of November she had 14 headaches and for the month of December 3 headaches so far. Recent DEXA scan showed osteoporosis and she is due to get Relcast yearly. She returns for reevaluation UPDATE 06/13/2018CM Jordan Mahoney, C66-year-old female returns for follow-up with a long history of headaches. She is currently on Topamax 100 twice daily with improvement in the number of headaches. She has tried to taper off of Topamax in the past but her headaches returned. She does not have a headache every day but weather changes and strong odors and wine are big migraine triggers for her. Relpax continues to work acutely. She has not kept a record of her headaches. She continues to take magnesium 400 mg daily and she takes Flexeril 10 mg when necessary.She returns for reevaluation UPDATE 12/13/2018CM Jordan Mahoney, 66 year old female returns for follow-up with a long history of migraine headaches.  She is currently on Topamax 100 mg twice daily in addition to Relpax acutely which continues to work for her headaches.  She is also on magnesium and riboflavin.  She is currently in the process of buying a house and states stress has increased her headaches.  Her other migraine triggers are weather changes strong  odors and  wine.  She gets no regular exercise.  She does have Flexeril to take as needed when necessary, she would like it prescribed daily.  She now is wearing bilateral hearing aids.  She returns for reevaluation REVIEW OF SYSTEMS: Full 14 system review of systems performed and notable only for those listed, all others are neg:  Constitutional: neg  Cardiovascular: neg Ear/Nose/Throat: Bilateral hearing aids Skin: neg Eyes: neg Respiratory: neg Gastroitestinal: neg  Hematology/Lymphatic: neg  Endocrine: neg Musculoskeletal:neg Allergy/Immunology: neg Neurological: History of migraines currently controlled Psychiatric: neg Sleep : neg   ALLERGIES: Allergies  Allergen Reactions  . Latex   . Sulfa Antibiotics Other (See Comments)    Causes skin reaction ( on her mid back)  Pigmentation.   . Valium [Diazepam]     HOME MEDICATIONS: Outpatient Medications Prior to Visit  Medication Sig Dispense Refill  . azelastine (ASTELIN) 0.1 % nasal spray Place 2 sprays into both nostrils daily. Use in each nostril as directed 90 mL 1  . azelastine (OPTIVAR) 0.05 % ophthalmic solution 1 drop 2 (two) times daily. Reported on 08/26/2015    . Calcium Citrate-Vitamin D ([employer] CALCIUM CITRATE +D3 MINI) 200-250 MG-UNIT TABS Take by mouth.    . cetirizine (ZYRTEC) 10 MG tablet 1 tablet daily for runny nose or itching. 90 tablet 1  . cyclobenzaprine (FLEXERIL) 10 MG tablet Take 10 mg by mouth at bedtime.     . dorzolamide (TRUSOPT) 2 %  ophthalmic solution     . eletriptan (RELPAX) 40 MG tablet Take 1 tablet (40 mg total) by mouth as needed for migraine or headache. May repeat in 2 hours if headache persists or recurs. 27 tablet 2  . EPINEPHrine (EPIPEN 2-PAK) 0.3 mg/0.3 mL IJ SOAJ injection Inject 0.3 mg into the muscle as needed (for severe life-threatening allergic reaction).    . Ergocalciferol (VITAMIN D2) 2000 UNITS TABS Take by mouth daily.    [name] esomeprazole (NEXIUM) 20 MG capsule Take 20 mg by  mouth 2 (two) times daily before a meal.    . fexofenadine (ALLEGRA) 180 MG tablet Take 1 tablet (180 mg total) by mouth daily. 90 tablet 1  . FLUCELVAX QUADRIVALENT 0.5 ML SUSY Inject 0.5 mg into the muscle as directed.    [name] LOTEMAX 0.5 % ophthalmic suspension     . Magnesium 400 MG CAPS Take 400 mg by mouth daily.    . mometasone (NASONEX) 50 MCG/ACT nasal spray Place 2 sprays into the nose daily. Two sprays each in each nostril 51 g 1  . Multiple Vitamin (MULTIVITAMIN) tablet Take 1 tablet by mouth daily.    . Riboflavin 100 MG CAPS Take 100 mg by mouth 2 (two) times daily.    . solifenacin (VESICARE) 10 MG tablet Take 10 mg by mouth daily.    . timolol (TIMOPTIC) 0.5 % ophthalmic solution     . topiramate (TOPAMAX) 100 MG tablet Take 1 tablet (100 mg total) by mouth 2 (two) times daily. 180 tablet 3  . vitamin C (ASCORBIC ACID) 500 MG tablet Take 500 mg by mouth 2 (two) times daily.    [name] zolpidem (AMBIEN) 10 MG tablet Take 10 mg by mouth as needed.    [name] CALCIUM CITRATE PO Take 500 mg by mouth 3 (three) times daily.    . calcium carbonate (TUMS - DOSED IN MG ELEMENTAL CALCIUM) 500 MG chewable tablet Chew 1 tablet by mouth 3 (three) times daily.    [name] omeprazole (PRILOSEC) 40 MG capsule     . oxyCODONE-acetaminophen (PERCOCET/ROXICET) 5-325 MG tablet      No facility-administered medications prior to visit.     PAST MEDICAL HISTORY: Past Medical History:  Diagnosis Date  . Allergic rhinoconjunctivitis   . HA (headache)   . Hearing loss    wearing bilateral aides    PAST SURGICAL HISTORY: Past Surgical History:  Procedure Laterality Date  . belly button    . c sections    . CATARACT EXTRACTION Right   . ELBOW SURGERY Left   . GANGLION CYST EXCISION Left 01/22/2016   base of thumb  . WRIST SURGERY Right     FAMILY HISTORY: Family History  Problem Relation Age of Onset  . Diabetes Father   . Migraines Daughter   . Allergic rhinitis Neg Hx   . Angioedema Neg Hx   .  Asthma Neg Hx   . Eczema Neg Hx   . Immunodeficiency Neg Hx   . Urticaria Neg Hx     SOCIAL HISTORY: Social History   Socioeconomic History  . Marital status: Married    Spouse name: Not on file  . Number of children: 2  . Years of education: college  . Highest education level: Not on file  Social Needs  . Financial resource strain: Not on file  . Food insecurity - worry: Not on file  . Food insecurity - inability: Not on file  . Transportation needs - medical: Not on  file  . Transportation needs - non-medical: Not on file  Occupational History    Comment: retired  Tobacco Use  . Smoking status: Former Research scientist (life sciences)  . Smokeless tobacco: Never Used  . Tobacco comment: Quit in 1985  Substance and Sexual Activity  . Alcohol use: Yes    Alcohol/week: 0.6 oz    Types: 1 Cans of beer per week    Comment: Rare   . Drug use: No  . Sexual activity: Not on file  Other Topics Concern  . Not on file  Social History Narrative   Patient lives at home with her husband [name]).    Education two years of college.   Caffeine two glasses of tea.     PHYSICAL EXAM  Vitals:   01/27/17 0915  BP: (!) 150/77  Pulse: (!) 54  Weight: 170 lb 3.2 oz (77.2 kg)  Height: 5' 5.5" (1.664 m)   Body mass index is 27.89 kg/m. Generalized: In no acute distress Neck: Supple,  Musculoskeletal: No deformity  Neurological examination Mentation: Alert oriented to time, place, history taking, and causual conversation  Cranial nerve II-XII: PERL. Extraocular movements were full. Visual field were full on confrontational test.  Facial sensation and strength were normal. Hearing was intact to finger rubbing bilaterally. Uvula tongue midline. Head turning and shoulder shrug and were normal and symmetric.Tongue protrusion into cheek strength was normal. Motor: Normal tone, bulk and strength, except limited range of motion of left elbow, Sensory: Intact to fine touch,  Coordination: Normal finger to  nose, heel-to-shin bilaterally  Gait: Ambulated short distance, can heel toe and tandem without difficulty. No assistive device Deep tendon reflexes: Brachioradialis 2/2, biceps 2/2, triceps 2/2, patellar 3/3 , Achilles 2/2, plantar responses were flexor bilaterally  DIAGNOSTIC DATA (LABS, IMAGING, TESTING)  ASSESSMENT AND PLAN  66 y.o. year old female  has a past medical history of Allergic rhinoconjunctivitis, HA (headache), and Hearing loss. here to follow-up for her migraine headaches which are in fairly good control with Topamax. Relpax works acutely. She tried to taper off of Topamax in the past but the headaches return .  Stress, weather changes, strong odors, and wine are her triggers.  PLANContinue Relpax acutely will refill Continue to avoid migraine triggers Continue Topamax 100mg  twice daily will refill Continue magnesium 400 mg daily Continue Flexeril  At hs will refill Discussed stress reduction techniques Follow-up  In 8 months Dennie Bible, [facility], [facility], APRN  [facility] [address] [place],  [address] [phone]

## 2017-01-27 ENCOUNTER — Encounter: Payer: Self-pay | Admitting: Nurse Practitioner

## 2017-01-27 ENCOUNTER — Ambulatory Visit (INDEPENDENT_AMBULATORY_CARE_PROVIDER_SITE_OTHER): Admitting: Nurse Practitioner

## 2017-01-27 VITALS — BP 150/77 | HR 54 | Ht 65.5 in | Wt 170.2 lb

## 2017-01-27 DIAGNOSIS — G43909 Migraine, unspecified, not intractable, without status migrainosus: Secondary | ICD-10-CM | POA: Diagnosis not present

## 2017-01-27 MED ORDER — TOPIRAMATE 100 MG PO TABS: 100.0000 mg | ORAL_TABLET | Freq: Two times a day (BID) | ORAL | 3 refills | Status: DC

## 2017-01-27 MED ORDER — ELETRIPTAN HYDROBROMIDE 40 MG PO TABS: 40.0000 mg | ORAL_TABLET | ORAL | 2 refills | Status: DC | PRN

## 2017-01-27 MED ORDER — CYCLOBENZAPRINE HCL 10 MG PO TABS: 10.0000 mg | ORAL_TABLET | Freq: Every day | ORAL | 2 refills | Status: DC

## 2017-01-27 NOTE — Patient Instructions (Addendum)
Continue Relpax acutely will refill Continue to avoid migraine triggers Continue Topamax 133m twice daily will refill Continue magnesium 400 mg daily Continue Flexeril  At hs will refill Follow-up  In 8 months  Stress and Stress Management Stress is a normal reaction to life events. It is what you feel when life demands more than you are used to or more than you can handle. Some stress can be useful. For example, the stress reaction can help you catch the last bus of the day, study for a test, or meet a deadline at work. But stress that occurs too often or for too long can cause problems. It can affect your emotional health and interfere with relationships and normal daily activities. Too much stress can weaken your immune system and increase your risk for physical illness. If you already have a medical problem, stress can make it worse. What are the causes? All sorts of life events may cause stress. An event that causes stress for one person may not be stressful for another person. Major life events commonly cause stress. These may be positive or negative. Examples include losing your job, moving into a new home, getting married, having a baby, or losing a loved one. Less obvious life events may also cause stress, especially if they occur day after day or in combination. Examples include working long hours, driving in traffic, caring for children, being in debt, or being in a difficult relationship. What are the signs or symptoms? Stress may cause emotional symptoms including, the following:  Anxiety. This is feeling worried, afraid, on edge, overwhelmed, or out of control.  Anger. This is feeling irritated or impatient.  Depression. This is feeling sad, down, helpless, or guilty.  Difficulty focusing, remembering, or making decisions.  Stress may cause physical symptoms, including the following:  Aches and pains. These may affect your head, neck, back, stomach, or other areas of your  body.  Tight muscles or clenched jaw.  Low energy or trouble sleeping.  Stress may cause unhealthy behaviors, including the following:  Eating to feel better (overeating) or skipping meals.  Sleeping too little, too much, or both.  Working too much or putting off tasks (procrastination).  Smoking, drinking alcohol, or using drugs to feel better.  How is this diagnosed? Stress is diagnosed through an assessment by your health care provider. Your health care provider will ask questions about your symptoms and any stressful life events.Your health care provider will also ask about your medical history and may order blood tests or other tests. Certain medical conditions and medicine can cause physical symptoms similar to stress. Mental illness can cause emotional symptoms and unhealthy behaviors similar to stress. Your health care provider may refer you to a mental health professional for further evaluation. How is this treated? Stress management is the recommended treatment for stress.The goals of stress management are reducing stressful life events and coping with stress in healthy ways. Techniques for reducing stressful life events include the following:  Stress identification. Self-monitor for stress and identify what causes stress for you. These skills may help you to avoid some stressful events.  Time management. Set your priorities, keep a calendar of events, and learn to say "no." These tools can help you avoid making too many commitments.  Techniques for coping with stress include the following:  Rethinking the problem. Try to think realistically about stressful events rather than ignoring them or overreacting. Try to find the positives in a stressful situation rather than focusing on the  negatives.  Exercise. Physical exercise can release both physical and emotional tension. The key is to find a form of exercise you enjoy and do it regularly.  Relaxation techniques. These  relax the body and mind. Examples include yoga, meditation, tai chi, biofeedback, deep breathing, progressive muscle relaxation, listening to music, being out in nature, journaling, and other hobbies. Again, the key is to find one or more that you enjoy and can do regularly.  Healthy lifestyle. Eat a balanced diet, get plenty of sleep, and do not smoke. Avoid using alcohol or drugs to relax.  Strong support network. Spend time with family, friends, or other people you enjoy being around.Express your feelings and talk things over with someone you trust.  Counseling or talktherapy with a mental health professional may be helpful if you are having difficulty managing stress on your own. Medicine is typically not recommended for the treatment of stress.Talk to your health care provider if you think you need medicine for symptoms of stress. Follow these instructions at home:  Keep all follow-up visits as directed by your health care provider.  Take all medicines as directed by your health care provider. Contact a health care provider if:  Your symptoms get worse or you start having new symptoms.  You feel overwhelmed by your problems and can no longer manage them on your own. Get help right away if:  You feel like hurting yourself or someone else. This information is not intended to replace advice given to you by your health care provider. Make sure you discuss any questions you have with your health care provider. Document Released: 07/28/2000 Document Revised: 07/10/2015 Document Reviewed: 09/26/2012 Elsevier Interactive Patient Education  2017 [employer].

## 2017-01-28 NOTE — Progress Notes (Signed)
I have reviewed and agreed above plan. 

## 2017-02-01 ENCOUNTER — Ambulatory Visit (INDEPENDENT_AMBULATORY_CARE_PROVIDER_SITE_OTHER)

## 2017-02-01 DIAGNOSIS — J309 Allergic rhinitis, unspecified: Secondary | ICD-10-CM | POA: Diagnosis not present

## 2017-02-17 ENCOUNTER — Other Ambulatory Visit: Payer: Self-pay

## 2017-02-17 DIAGNOSIS — J3089 Other allergic rhinitis: Secondary | ICD-10-CM | POA: Diagnosis not present

## 2017-02-22 ENCOUNTER — Ambulatory Visit (INDEPENDENT_AMBULATORY_CARE_PROVIDER_SITE_OTHER)

## 2017-02-22 DIAGNOSIS — J309 Allergic rhinitis, unspecified: Secondary | ICD-10-CM | POA: Diagnosis not present

## 2017-03-08 ENCOUNTER — Ambulatory Visit (INDEPENDENT_AMBULATORY_CARE_PROVIDER_SITE_OTHER)

## 2017-03-08 DIAGNOSIS — J309 Allergic rhinitis, unspecified: Secondary | ICD-10-CM

## 2017-03-22 ENCOUNTER — Ambulatory Visit (INDEPENDENT_AMBULATORY_CARE_PROVIDER_SITE_OTHER)

## 2017-03-22 DIAGNOSIS — J309 Allergic rhinitis, unspecified: Secondary | ICD-10-CM

## 2017-03-29 ENCOUNTER — Ambulatory Visit (INDEPENDENT_AMBULATORY_CARE_PROVIDER_SITE_OTHER)

## 2017-03-29 DIAGNOSIS — J309 Allergic rhinitis, unspecified: Secondary | ICD-10-CM | POA: Diagnosis not present

## 2017-04-05 ENCOUNTER — Ambulatory Visit (INDEPENDENT_AMBULATORY_CARE_PROVIDER_SITE_OTHER): Admitting: *Deleted

## 2017-04-05 DIAGNOSIS — J309 Allergic rhinitis, unspecified: Secondary | ICD-10-CM | POA: Diagnosis not present

## 2017-04-12 ENCOUNTER — Ambulatory Visit (INDEPENDENT_AMBULATORY_CARE_PROVIDER_SITE_OTHER)

## 2017-04-12 DIAGNOSIS — J309 Allergic rhinitis, unspecified: Secondary | ICD-10-CM | POA: Diagnosis not present

## 2017-04-12 MED ORDER — EPINEPHRINE 0.3 MG/0.3ML IJ SOAJ: 0.3000 mg | INTRAMUSCULAR | 1 refills | Status: DC | PRN

## 2017-04-19 ENCOUNTER — Ambulatory Visit (INDEPENDENT_AMBULATORY_CARE_PROVIDER_SITE_OTHER): Admitting: *Deleted

## 2017-04-19 DIAGNOSIS — J309 Allergic rhinitis, unspecified: Secondary | ICD-10-CM

## 2017-05-03 ENCOUNTER — Encounter: Payer: Self-pay | Admitting: Allergy & Immunology

## 2017-05-03 ENCOUNTER — Ambulatory Visit (INDEPENDENT_AMBULATORY_CARE_PROVIDER_SITE_OTHER): Admitting: Allergy & Immunology

## 2017-05-03 VITALS — BP 128/78 | HR 64 | Resp 16 | Wt 160.0 lb

## 2017-05-03 DIAGNOSIS — E739 Lactose intolerance, unspecified: Secondary | ICD-10-CM

## 2017-05-03 DIAGNOSIS — K219 Gastro-esophageal reflux disease without esophagitis: Secondary | ICD-10-CM | POA: Diagnosis not present

## 2017-05-03 DIAGNOSIS — J309 Allergic rhinitis, unspecified: Secondary | ICD-10-CM

## 2017-05-03 DIAGNOSIS — J302 Other seasonal allergic rhinitis: Secondary | ICD-10-CM | POA: Diagnosis not present

## 2017-05-03 DIAGNOSIS — J3089 Other allergic rhinitis: Secondary | ICD-10-CM

## 2017-05-03 MED ORDER — AZELASTINE HCL 0.1 % NA SOLN: 2.0000 | Freq: Every day | NASAL | 1 refills | Status: DC

## 2017-05-03 NOTE — Patient Instructions (Addendum)
1. Chronic nonseasonal allergic rhinitis  - Continue with allergy shots at the same schedule. - Continue with Astelin two sprays per nostril once daily. - Continue with Allegra 180mg  twice daily.  - Continue with Nasonex 1-2 sprays per nostril daily.  2. Lactose intolerance - Continue to use Lactaid milk.  3. GERD - Continue with Nexium for now. - Avoid your food allergies.   4. Return in about 1 year (around 05/04/2018).   Please inform us of any Emergency Department visits, hospitalizations, or changes in symptoms. Call us before going to the ED for breathing or allergy symptoms since we might be able to fit you in for a sick visit. Feel free to contact us anytime with any questions, problems, or concerns.  It was a pleasure to see you again today!  Websites that have reliable patient information: 1. American Academy of Asthma, Allergy, and Immunology: www.aaaai.org 2. Food Allergy Research and Education (FARE): foodallergy.org 3. Mothers of Asthmatics: http://www.asthmacommunitynetwork.org 4. American College of Allergy, Asthma, and Immunology: www.acaai.org

## 2017-05-03 NOTE — Progress Notes (Signed)
FOLLOW UP  Date of Service/Encounter:  05/03/17   Assessment:   Lactose intolerance  Seasonal and perennial allergic rhinitis (molds, dust mites, cat and dog)   Gastroesophageal reflux disease  Plan/Recommendations:   1. Chronic nonseasonal allergic rhinitis  - Continue with allergy shots at the same schedule. - Continue with Astelin two sprays per nostril once daily. - Continue with Allegra 180mg  twice daily.  - Continue with Nasonex 1-2 sprays per nostril daily.  2. Lactose intolerance - Continue to use Lactaid milk.  3. GERD - Continue with Nexium for now. - Avoid your food allergies.   4. Return in about 1 year (around 05/04/2018).  Subjective:   Jordan Mahoney is a 67 y.o. female presenting today for follow up of  Chief Complaint  Patient presents with  . Allergic Rhinitis   . Gastroesophageal Reflux  . Medication Management    Needs Refills    Jordan Mahoney has a history of the following: Patient Active Problem List   Diagnosis Date Noted  . Allergic rhinitis 11/07/2014  . Headache, migraine 03/27/2014  . Migraine 12/11/2013  . Numbness 12/11/2013  . Urinary incontinence 12/11/2013  . HA (headache)     History obtained from: chart review and patient.  Jordan Mahoney's Primary Care Provider is Barry Dienes, NP.     Jordan Mahoney is a 67 y.o. female presenting for a follow up visit. She was last seen in April 2018. At that time, we continued her on allergy shots in combination with Astelin two sprays per nostril daily and Allegra 180mg  tablet daily. We also continued her on Nasonex daily. We have been trying to get Rhinocort approved by [employer] but I am unsure if this was successful or not. We continued her on Nexium for her GERD as well as Lactaid for her lactose intolerance.   Since the last visit, she has done well. She did move since the last visit, which resulted in her losing her medications for a period of time. She has now gotten a larger space.    [place] is on allergen immunotherapy. She receives one injection. Immunotherapy script #1 contains molds, dust mites, cat and dog. She currently receives 0.69mL of the RED vial (1/100). She started shots in early 2016 and reached maintenance in September of 2016. She is doing well on her shots without any adverse events.   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 Carloyn Manner in [place]. He is retiring and now she wants to go ahead and get the surgery performed in her neck. Her husband is also having surgery. This has not yet been scheduled.   Otherwise, there have been no changes to her past medical history, surgical history, family history, or social history.    Review of Systems: a 14-point review of systems is pertinent for what is mentioned in HPI.  Otherwise, all other systems were negative. Constitutional: negative other than that listed in the HPI Eyes: negative other than that listed in the HPI Ears, nose, mouth, throat, and face: negative other than that listed in the HPI Respiratory: negative other than that listed in the HPI Cardiovascular: negative other than that listed in the HPI Gastrointestinal: negative other than that listed in the HPI Genitourinary: negative other than that listed in the HPI Integument: negative other than that listed in the HPI Hematologic: negative other than that listed in the HPI Musculoskeletal: negative other than that listed in the HPI Neurological: negative other than that listed in the HPI Allergy/Immunologic: negative  other than that listed in the HPI    Objective:   Blood pressure 128/78, pulse 64, resp. rate 16, weight 160 lb (72.6 kg). Body mass index is 26.22 kg/m.   Physical Exam:  General: Alert, interactive, in no acute distress. Pleasant.  Eyes: No conjunctival injection bilaterally, no discharge on the right, no discharge on the left and no Horner-Trantas dots present. PERRL bilaterally. EOMI without pain. No photophobia.   Ears: Right TM pearly gray with normal light reflex, Left TM pearly gray with normal light reflex, Right TM intact without perforation and Left TM intact without perforation.  Nose/Throat: External nose within normal limits and septum midline. Turbinates edematous with clear discharge. Posterior oropharynx erythematous without cobblestoning in the posterior oropharynx. Tonsils 2+ without exudates.  Tongue without thrush. Lungs: Clear to auscultation without wheezing, rhonchi or rales. No increased work of breathing. CV: Normal S1/S2. No murmurs. Capillary refill <2 seconds.  Skin: Warm and dry, without lesions or rashes. Neuro:   Grossly intact. No focal deficits appreciated. Responsive to questions.  Diagnostic studies: none      Salvatore Marvel, MD [facility]

## 2017-05-11 ENCOUNTER — Other Ambulatory Visit: Payer: Self-pay | Admitting: Allergy & Immunology

## 2017-05-11 MED ORDER — MOMETASONE FUROATE 50 MCG/ACT NA SUSP: 2.0000 | Freq: Every day | NASAL | 3 refills | Status: DC

## 2017-05-11 NOTE — Telephone Encounter (Signed)
Patient was seen 05-03-17, for her yearly check up and to renew all her medicines. She said Nasonex was not sent. [facility].

## 2017-05-11 NOTE — Telephone Encounter (Signed)
[facility] left message informing patient.

## 2017-05-12 ENCOUNTER — Other Ambulatory Visit: Payer: Self-pay | Admitting: Allergy & Immunology

## 2017-05-12 NOTE — Telephone Encounter (Signed)
Patient called for a refill on her Nasonex, yesterday, 3-27.2019. She called back today and said she cannot take the generic, which is what was sent in. She also said the nurse cannot sign for this, it has to be Dr. Ernst Bowler, or [facility] will not accept it.

## 2017-05-12 NOTE — Telephone Encounter (Signed)
This was already done and was done correctly.

## 2017-05-17 ENCOUNTER — Ambulatory Visit (INDEPENDENT_AMBULATORY_CARE_PROVIDER_SITE_OTHER)

## 2017-05-17 DIAGNOSIS — J309 Allergic rhinitis, unspecified: Secondary | ICD-10-CM | POA: Diagnosis not present

## 2017-05-31 ENCOUNTER — Ambulatory Visit (INDEPENDENT_AMBULATORY_CARE_PROVIDER_SITE_OTHER): Admitting: *Deleted

## 2017-05-31 DIAGNOSIS — J309 Allergic rhinitis, unspecified: Secondary | ICD-10-CM

## 2017-06-09 NOTE — Progress Notes (Signed)
Vial exp 06-10-18

## 2017-06-10 DIAGNOSIS — J3089 Other allergic rhinitis: Secondary | ICD-10-CM | POA: Diagnosis not present

## 2017-06-14 ENCOUNTER — Ambulatory Visit (INDEPENDENT_AMBULATORY_CARE_PROVIDER_SITE_OTHER)

## 2017-06-14 DIAGNOSIS — J309 Allergic rhinitis, unspecified: Secondary | ICD-10-CM

## 2017-06-28 ENCOUNTER — Ambulatory Visit (INDEPENDENT_AMBULATORY_CARE_PROVIDER_SITE_OTHER): Admitting: *Deleted

## 2017-06-28 DIAGNOSIS — J309 Allergic rhinitis, unspecified: Secondary | ICD-10-CM

## 2017-07-04 HISTORY — PX: OTHER SURGICAL HISTORY: SHX169

## 2017-07-19 ENCOUNTER — Ambulatory Visit (INDEPENDENT_AMBULATORY_CARE_PROVIDER_SITE_OTHER): Admitting: *Deleted

## 2017-07-19 DIAGNOSIS — J309 Allergic rhinitis, unspecified: Secondary | ICD-10-CM | POA: Diagnosis not present

## 2017-08-02 ENCOUNTER — Ambulatory Visit (INDEPENDENT_AMBULATORY_CARE_PROVIDER_SITE_OTHER)

## 2017-08-02 DIAGNOSIS — J309 Allergic rhinitis, unspecified: Secondary | ICD-10-CM | POA: Diagnosis not present

## 2017-08-09 ENCOUNTER — Ambulatory Visit (INDEPENDENT_AMBULATORY_CARE_PROVIDER_SITE_OTHER)

## 2017-08-09 DIAGNOSIS — J309 Allergic rhinitis, unspecified: Secondary | ICD-10-CM | POA: Diagnosis not present

## 2017-08-16 ENCOUNTER — Ambulatory Visit (INDEPENDENT_AMBULATORY_CARE_PROVIDER_SITE_OTHER): Admitting: *Deleted

## 2017-08-16 DIAGNOSIS — J309 Allergic rhinitis, unspecified: Secondary | ICD-10-CM

## 2017-08-23 ENCOUNTER — Ambulatory Visit (INDEPENDENT_AMBULATORY_CARE_PROVIDER_SITE_OTHER)

## 2017-08-23 DIAGNOSIS — J309 Allergic rhinitis, unspecified: Secondary | ICD-10-CM

## 2017-08-30 ENCOUNTER — Ambulatory Visit (INDEPENDENT_AMBULATORY_CARE_PROVIDER_SITE_OTHER)

## 2017-08-30 DIAGNOSIS — J309 Allergic rhinitis, unspecified: Secondary | ICD-10-CM | POA: Diagnosis not present

## 2017-09-13 ENCOUNTER — Ambulatory Visit (INDEPENDENT_AMBULATORY_CARE_PROVIDER_SITE_OTHER)

## 2017-09-13 DIAGNOSIS — J309 Allergic rhinitis, unspecified: Secondary | ICD-10-CM | POA: Diagnosis not present

## 2017-09-27 ENCOUNTER — Ambulatory Visit (INDEPENDENT_AMBULATORY_CARE_PROVIDER_SITE_OTHER): Payer: Medicare Other

## 2017-09-27 DIAGNOSIS — J309 Allergic rhinitis, unspecified: Secondary | ICD-10-CM | POA: Diagnosis not present

## 2017-09-27 NOTE — Progress Notes (Signed)
[facility]  PATIENT: Jordan Mahoney DOB: [birth year]   REASON FOR VISIT: Follow-up for migraine headaches HISTORY FROM: Patient    HISTORY OF PRESENT ILLNESS:Jordan Mahoney is 67 years old right-handed Caucasian female, accompanied by her husband, referred to primary care physician Dr. Kieth Brightly, and ENT Dr. Redmond Pulling for evaluation of frequent headaches She reported long history of headaches since teenager, it was typical migraine, holocranial severe pounding headaches, with associated light noise sensitivity, nauseous, can last few hours to days, over the years, she has been dealing with her migraine to variable degree, previously tried Imitrex, which does help her migraine, but causing palpitation She reported increased migraine since 2014, she had about once every month severe prolonged typical migraine headaches, in between, she also has almost daily low-grade pressure headache behind her eyes, with associated light noise smell sensitivity. 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 She also had a history of gastric bypass in 2010, initially with 100 pound loss, now dealing with her frequent headaches, left elbow pain, recent surgery for tendinitis, she has regained 40 pounds, She report 2 month history of urinary incontinence, since August 2015, she also noticed bilateral fingertips paresthesia, but denies significant neck pain, denies significant gait difficulty Laboratory evaluation in June 2015 showed normal CBC, ferritin 28.2, LDL 88, normal iron level, TSH 2.2 9, vitamin D 37.9, B12 570  UPDATE Dec 26 2013: She has tired Maxalt 5 mg, which has been helfpful, but she also has to take second dosage because headaches comes back a few hours later, she also complains of fatigue, jaw tightness after medications, she is on higher dose of Topamax 100 mg twice a day, complains of fingertips, toes  numbness, fatigue, Her headache overall has much improved, she has. In between, she is headache free, she denies gait difficulty, no significant neck pain, But she complains of acute onset urinary urgency, occasionally incontinence, Vesicare has been helpful, now is on generic oxybutynin,  We have reviewed MRI of the cervical spine, which has demonstrated multilevel degenerative disc disease, most severe at C5-6, C6 and 7, with mild-to-moderate canal stenosis, there was no cord signal change  UPDATE Feb 10th 2016: She is now tolerating Topamax 100mg  bid, still has mild fingertips paresthesia, she is also taking magnesium, and riboflavin, her headache are under much better control, couple times each months, she had her typical migraine headaches, Maxalta 5mg  prn, was helpful, but often has to take second dosage, trigger for her migraines are weather change, strong smell, certain food, wines, processed food,  UPDATE 09/25/2014: [name], 67 year old female returns for follow-up. She has a history of migraines and her migraines have worsened in the last couple of weeks due to changes in the weather she claims. Her headache today is a 10 on the pain scale of 1-10. She is nauseated. She has photophobia. She remains on her Topamax as preventive. She did not take her Maxalt this morning. She returns for reevaluation UPDATE 12/26/2014 Ms. Cross, 67year-old female returns for follow-up. She has a history of migraine headaches. She is currently on Topamax 100 twice daily and her triptan has been changed to Relpax, works acutely for her. Her headaches are in good control. Her biggest triggers are weather changes, certain foods and strong perfumes. She tries to avoid these if at all possible. She returns for reevaluation UPDATE 06/12/2017CM [name], 67 year old female returns for follow-up. She has a history of headaches/migraines and  was on Topamax when last seen however she went on vacation, forgot the  medication, and she has not resumed. She claims her headaches are in good control she takes Relpax acutely. Her biggest trigger  are weather changes and strong odors. She returns for reevaluation She has had abnormal liver function tests and is due for liver scan  tomorrow . UPDATE 12/18/2017CM [name], 67 year old female returns for follow-up. She has a history of headaches and migraine and then last seen she was off of her Topamax and just using Relpax acutely. She called on November 7 saying her headaches have increased and she was placed back on Topamax 50 mg twice daily she had previously been on 100 mg twice daily. Her headaches have much improved on the lower dose and she still takes Relpax acutely. For the month of November she had 14 headaches and for the month of December 3 headaches so far. Recent DEXA scan showed osteoporosis and she is due to get Relcast yearly. She returns for reevaluation UPDATE 06/13/2018CM [name], 67 year old female returns for follow-up with a long history of headaches. She is currently on Topamax 100 twice daily with improvement in the number of headaches. She has tried to taper off of Topamax in the past but her headaches returned. She does not have a headache every day but weather changes and strong odors and wine are big migraine triggers for her. Relpax continues to work acutely. She has not kept a record of her headaches. She continues to take magnesium 400 mg daily and she takes Flexeril 10 mg when necessary.She returns for reevaluation UPDATE 12/13/2018CM [name], 67 year old female returns for follow-up with a long history of migraine headaches.  She is currently on Topamax 100 mg twice daily in addition to Relpax acutely which continues to work for her headaches.  She is also on magnesium and riboflavin.  She is currently in the process of buying a house and states stress has increased her headaches.  Her other migraine triggers are weather changes strong  odors and  wine.  She gets no regular exercise.  She does have Flexeril to take as needed when necessary, she would like it prescribed daily.  She now is wearing bilateral hearing aids.  She returns for reevaluation  UPDATE 8/14/2019CM Ms. Mesmer, 67 year old female returns for follow-up with long history of migraine headaches which are currently well controlled on Topamax 100 twice daily with Relpax acutely.  She also takes riboflavin and magnesium.  Her biggest migraine triggers are weather changes she gets no regular exercise.  She had cervical fusion May 20 by Dr. Glenna Fellows.  She continues to take Flexeril.  She returns for reevaluation REVIEW OF SYSTEMS: Full 14 system review of systems performed and notable only for those listed, all others are neg:  Constitutional: neg  Cardiovascular: neg Ear/Nose/Throat: Bilateral hearing aids Skin: neg Eyes: neg Respiratory: neg Gastroitestinal: neg  Hematology/Lymphatic: neg  Endocrine: neg Musculoskeletal:neg Allergy/Immunology: neg Neurological: History of migraines currently controlled Psychiatric: neg Sleep : neg   ALLERGIES: Allergies  Allergen Reactions  . Latex   . Sulfa Antibiotics Other (See Comments)    Causes skin reaction ( on her mid back)  Pigmentation.   . Tape   . Valium [Diazepam]     HOME MEDICATIONS: Outpatient Medications Prior to Visit  Medication Sig Dispense Refill  . azelastine (ASTELIN) 0.1 % nasal spray Place 2 sprays into both nostrils daily. (Patient taking differently: Place 2 sprays into both nostrils at bedtime. )  90 mL 1  . CALCIUM CITRATE PO Take by mouth. 500mg  po twice daily    . dorzolamide (TRUSOPT) 2 % ophthalmic solution Place 1 drop into the right eye 2 (two) times daily.     [name] eletriptan (RELPAX) 40 MG tablet Take 1 tablet (40 mg total) by mouth as needed for migraine or headache. May repeat in 2 hours if headache persists or recurs. 27 tablet 2  . Ergocalciferol (VITAMIN D2) 2000 UNITS TABS  Take by mouth daily.    . famotidine (PEPCID) 20 MG tablet Take 20 mg by mouth 2 (two) times daily.    . fexofenadine (ALLEGRA) 180 MG tablet Take 1 tablet (180 mg total) by mouth daily. 90 tablet 1  . LOTEMAX 0.5 % ophthalmic suspension Place 1 drop into the right eye 2 (two) times daily.     . Magnesium 400 MG CAPS Take 400 mg by mouth daily.    . mometasone (NASONEX) 50 MCG/ACT nasal spray Place 2 sprays into the nose daily. Two sprays each in each nostril 51 g 3  . Multiple Vitamin (MULTIVITAMIN) tablet Take 1 tablet by mouth daily.    . Omega-3 Krill Oil 500 MG CAPS Take 1 capsule by mouth 2 (two) times daily.    . Riboflavin 100 MG CAPS Take 100 mg by mouth 2 (two) times daily.    . solifenacin (VESICARE) 10 MG tablet Take 10 mg by mouth daily.    . timolol (TIMOPTIC) 0.5 % ophthalmic solution Place 1 drop into the right eye 2 (two) times daily.     [name] topiramate (TOPAMAX) 100 MG tablet Take 1 tablet (100 mg total) by mouth 2 (two) times daily. 180 tablet 3  . vitamin C (ASCORBIC ACID) 500 MG tablet Take 500 mg by mouth 2 (two) times daily.    [name] zolpidem (AMBIEN) 10 MG tablet Take 10 mg by mouth as needed.    [name] EPINEPHrine (EPIPEN 2-PAK) 0.3 mg/0.3 mL IJ SOAJ injection Inject 0.3 mLs (0.3 mg total) into the muscle as needed (for severe life-threatening allergic reaction). (Patient not taking: Reported on 09/28/2017) 2 Device 1  . azelastine (OPTIVAR) 0.05 % ophthalmic solution 1 drop 2 (two) times daily. Reported on 08/26/2015    . Calcium Citrate-Vitamin D ([employer] CALCIUM CITRATE +D3 MINI) 200-250 MG-UNIT TABS Take by mouth.    . cetirizine (ZYRTEC) 10 MG tablet 1 tablet daily for runny nose or itching. (Patient not taking: Reported on 05/03/2017) 90 tablet 1  . cyclobenzaprine (FLEXERIL) 10 MG tablet Take 1 tablet (10 mg total) by mouth at bedtime. 90 tablet 2  . esomeprazole (NEXIUM) 20 MG capsule Take 20 mg by mouth 2 (two) times daily before a meal.    . FLUCELVAX QUADRIVALENT 0.5 ML SUSY  Inject 0.5 mg into the muscle as directed.     No facility-administered medications prior to visit.     PAST MEDICAL HISTORY: Past Medical History:  Diagnosis Date  . Allergic rhinoconjunctivitis   . HA (headache)   . Hearing loss    wearing bilateral aides    PAST SURGICAL HISTORY: Past Surgical History:  Procedure Laterality Date  . belly button    . c sections    . CATARACT EXTRACTION Right   . ELBOW SURGERY Left   . GANGLION CYST EXCISION Left 01/22/2016   base of thumb  . neck fusion  07/04/2017   Dr. Carloyn Manner in [place]  . WRIST SURGERY Right     FAMILY HISTORY: Family History  Problem Relation Age of Onset  . Diabetes Father   . Migraines Daughter   . Allergic rhinitis Neg Hx   . Angioedema Neg Hx   . Asthma Neg Hx   . Eczema Neg Hx   . Immunodeficiency Neg Hx   . Urticaria Neg Hx     SOCIAL HISTORY: Social History   Socioeconomic History  . Marital status: Married    Spouse name: Not on file  . Number of children: 2  . Years of education: college  . Highest education level: Not on file  Occupational History    Comment: retired  Scientific laboratory technician  . Financial resource strain: Not on file  . Food insecurity:    Worry: Not on file    Inability: Not on file  . Transportation needs:    Medical: Not on file    Non-medical: Not on file  Tobacco Use  . Smoking status: Former Research scientist (life sciences)  . Smokeless tobacco: Never Used  . Tobacco comment: Quit in 1985  Substance and Sexual Activity  . Alcohol use: Yes    Alcohol/week: 1.0 standard drinks    Types: 1 Cans of beer per week    Comment: Rare   . Drug use: No  . Sexual activity: Not on file  Lifestyle  . Physical activity:    Days per week: Not on file    Minutes per session: Not on file  . Stress: Not on file  Relationships  . Social connections:    Talks on phone: Not on file    Gets together: Not on file    Attends religious service: Not on file    Active member of club or organization: Not on file     Attends meetings of clubs or organizations: Not on file    Relationship status: Not on file  . Intimate partner violence:    Fear of current or ex partner: Not on file    Emotionally abused: Not on file    Physically abused: Not on file    Forced sexual activity: Not on file  Other Topics Concern  . Not on file  Social History Narrative   Patient lives at home with her husband [name]).    Education two years of college.   Caffeine two glasses of tea.     PHYSICAL EXAM  Vitals:   09/28/17 0838  BP: 116/84  Pulse: 91  Weight: 149 lb 12.8 oz (67.9 kg)  Height: 5' 5.5" (1.664 m)   Body mass index is 24.55 kg/m. Generalized: In no acute distress Neck: Supple,  Musculoskeletal: No deformity  Neurological examination Mentation: Alert oriented to time, place, history taking, and causual conversation  Cranial nerve II-XII: PERL. Extraocular movements were full. Visual field were full on confrontational test.  Facial sensation and strength were normal. Hearing was intact to finger rubbing bilaterally. Uvula tongue midline. Head turning and shoulder shrug and were normal and symmetric.Tongue protrusion into cheek strength was normal. Motor: Normal tone, bulk and strength,  Sensory: Intact to fine touch,  Coordination: Normal finger to nose, heel-to-shin bilaterally  Gait: Ambulated short distance, can heel toe and tandem without difficulty. No assistive device Deep tendon reflexes: Brachioradialis 2/2, biceps 2/2, triceps 2/2, patellar 3/3 , Achilles 2/2, plantar responses were flexor bilaterally  DIAGNOSTIC DATA (LABS, IMAGING, TESTING)  ASSESSMENT AND PLAN  67 y.o. year old female  has a past medical history of Allergic rhinoconjunctivitis, HA (headache), and Hearing loss. here to follow-up for her migraine headaches which are  in  good control with Topamax. Relpax works acutely. She tried to taper off of Topamax in the past but the headaches return .  Stress, weather changes,  strong odors, and wine are her triggers.  PLANContinue Relpax acutely will refill Continue to avoid migraine triggers Continue Topamax 100mg  twice daily will refill Continue magnesium 400 mg daily Continue riboflavin 100 mg twice daily Continue Flexeril per Dr. Carloyn Manner Follow-up  Yearly call if headaches worsen Dennie Bible, [facility], [facility], APRN  [facility] [address], [place], [place] [address] [phone]

## 2017-09-28 ENCOUNTER — Encounter: Payer: Self-pay | Admitting: Nurse Practitioner

## 2017-09-28 ENCOUNTER — Ambulatory Visit (INDEPENDENT_AMBULATORY_CARE_PROVIDER_SITE_OTHER): Payer: Medicare Other | Admitting: Nurse Practitioner

## 2017-09-28 VITALS — BP 116/84 | HR 91 | Ht 65.5 in | Wt 149.8 lb

## 2017-09-28 DIAGNOSIS — G43909 Migraine, unspecified, not intractable, without status migrainosus: Secondary | ICD-10-CM | POA: Diagnosis not present

## 2017-09-28 MED ORDER — TOPIRAMATE 100 MG PO TABS: 100.0000 mg | ORAL_TABLET | Freq: Two times a day (BID) | ORAL | 3 refills | Status: DC

## 2017-09-28 MED ORDER — ELETRIPTAN HYDROBROMIDE 40 MG PO TABS: 40.0000 mg | ORAL_TABLET | ORAL | 3 refills | Status: DC | PRN

## 2017-09-28 NOTE — Progress Notes (Signed)
I have reviewed and agreed above plan. 

## 2017-09-28 NOTE — Patient Instructions (Signed)
Continue Relpax acutely will refill Continue to avoid migraine triggers Continue Topamax 100mg  twice daily will refill Continue magnesium 400 mg daily Continue Flexeril   Follow-up  Yearly call if headaches worsen

## 2017-10-11 ENCOUNTER — Ambulatory Visit (INDEPENDENT_AMBULATORY_CARE_PROVIDER_SITE_OTHER): Payer: Medicare Other | Admitting: *Deleted

## 2017-10-11 DIAGNOSIS — J309 Allergic rhinitis, unspecified: Secondary | ICD-10-CM | POA: Diagnosis not present

## 2017-10-25 ENCOUNTER — Ambulatory Visit (INDEPENDENT_AMBULATORY_CARE_PROVIDER_SITE_OTHER): Payer: Medicare Other | Admitting: *Deleted

## 2017-10-25 DIAGNOSIS — J309 Allergic rhinitis, unspecified: Secondary | ICD-10-CM

## 2017-10-28 ENCOUNTER — Encounter: Payer: Self-pay | Admitting: *Deleted

## 2017-10-28 DIAGNOSIS — J3089 Other allergic rhinitis: Secondary | ICD-10-CM

## 2017-10-28 NOTE — Progress Notes (Signed)
Vials made. Exp: 10-29-18. hv

## 2017-11-08 ENCOUNTER — Ambulatory Visit (INDEPENDENT_AMBULATORY_CARE_PROVIDER_SITE_OTHER): Payer: Medicare Other

## 2017-11-08 DIAGNOSIS — J309 Allergic rhinitis, unspecified: Secondary | ICD-10-CM | POA: Diagnosis not present

## 2017-11-22 ENCOUNTER — Ambulatory Visit (INDEPENDENT_AMBULATORY_CARE_PROVIDER_SITE_OTHER): Payer: Medicare Other

## 2017-11-22 DIAGNOSIS — J309 Allergic rhinitis, unspecified: Secondary | ICD-10-CM

## 2017-12-07 ENCOUNTER — Ambulatory Visit (INDEPENDENT_AMBULATORY_CARE_PROVIDER_SITE_OTHER): Payer: Medicare Other

## 2017-12-07 DIAGNOSIS — J309 Allergic rhinitis, unspecified: Secondary | ICD-10-CM

## 2017-12-14 ENCOUNTER — Ambulatory Visit (INDEPENDENT_AMBULATORY_CARE_PROVIDER_SITE_OTHER): Payer: Medicare Other | Admitting: *Deleted

## 2017-12-14 DIAGNOSIS — J309 Allergic rhinitis, unspecified: Secondary | ICD-10-CM

## 2017-12-21 ENCOUNTER — Ambulatory Visit (INDEPENDENT_AMBULATORY_CARE_PROVIDER_SITE_OTHER): Payer: Medicare Other | Admitting: *Deleted

## 2017-12-21 DIAGNOSIS — J309 Allergic rhinitis, unspecified: Secondary | ICD-10-CM | POA: Diagnosis not present

## 2018-01-04 ENCOUNTER — Ambulatory Visit (INDEPENDENT_AMBULATORY_CARE_PROVIDER_SITE_OTHER): Payer: Medicare Other

## 2018-01-04 DIAGNOSIS — J309 Allergic rhinitis, unspecified: Secondary | ICD-10-CM | POA: Diagnosis not present

## 2018-01-11 ENCOUNTER — Ambulatory Visit (INDEPENDENT_AMBULATORY_CARE_PROVIDER_SITE_OTHER): Payer: Medicare Other

## 2018-01-11 DIAGNOSIS — J309 Allergic rhinitis, unspecified: Secondary | ICD-10-CM | POA: Diagnosis not present

## 2018-02-01 ENCOUNTER — Ambulatory Visit (INDEPENDENT_AMBULATORY_CARE_PROVIDER_SITE_OTHER): Payer: Medicare Other | Admitting: *Deleted

## 2018-02-01 DIAGNOSIS — J309 Allergic rhinitis, unspecified: Secondary | ICD-10-CM | POA: Diagnosis not present

## 2018-02-22 ENCOUNTER — Ambulatory Visit (INDEPENDENT_AMBULATORY_CARE_PROVIDER_SITE_OTHER): Payer: Medicare Other | Admitting: *Deleted

## 2018-02-22 DIAGNOSIS — J309 Allergic rhinitis, unspecified: Secondary | ICD-10-CM

## 2018-03-15 ENCOUNTER — Ambulatory Visit (INDEPENDENT_AMBULATORY_CARE_PROVIDER_SITE_OTHER): Payer: Medicare Other | Admitting: *Deleted

## 2018-03-15 DIAGNOSIS — J3089 Other allergic rhinitis: Secondary | ICD-10-CM | POA: Diagnosis not present

## 2018-03-15 DIAGNOSIS — J309 Allergic rhinitis, unspecified: Secondary | ICD-10-CM | POA: Diagnosis not present

## 2018-03-15 NOTE — Progress Notes (Signed)
VIAL EXP 03-16-2019 

## 2018-04-03 ENCOUNTER — Ambulatory Visit
Admission: RE | Admit: 2018-04-03 | Discharge: 2018-04-03 | Disposition: A | Discharge status: Home / Self Care | Payer: Medicare Other | Source: Ambulatory Visit | Attending: Surgery | Admitting: Surgery

## 2018-04-03 ENCOUNTER — Other Ambulatory Visit: Payer: Self-pay | Admitting: Surgery

## 2018-04-03 DIAGNOSIS — C187 Malignant neoplasm of sigmoid colon: Secondary | ICD-10-CM | POA: Insufficient documentation

## 2018-04-03 MED ORDER — IOPAMIDOL (ISOVUE-300) INJECTION 61%
100.0000 mL | Freq: Once | INTRAVENOUS | Status: AC | PRN
  Administered 2018-04-03: 100 mL via INTRAVENOUS

## 2018-04-04 ENCOUNTER — Encounter: Payer: Self-pay | Admitting: Oncology

## 2018-04-04 ENCOUNTER — Other Ambulatory Visit: Payer: Self-pay

## 2018-04-04 ENCOUNTER — Inpatient Hospital Stay: Payer: Medicare Other | Attending: Oncology | Admitting: Oncology

## 2018-04-04 ENCOUNTER — Inpatient Hospital Stay: Payer: Medicare Other

## 2018-04-04 ENCOUNTER — Ambulatory Visit: Payer: Self-pay | Admitting: Surgery

## 2018-04-04 VITALS — BP 134/82 | HR 87 | Temp 96.8°F | Resp 18 | Ht 65.0 in | Wt 153.3 lb

## 2018-04-04 DIAGNOSIS — C19 Malignant neoplasm of rectosigmoid junction: Secondary | ICD-10-CM | POA: Diagnosis present

## 2018-04-04 DIAGNOSIS — D509 Iron deficiency anemia, unspecified: Secondary | ICD-10-CM | POA: Diagnosis present

## 2018-04-04 DIAGNOSIS — Z9884 Bariatric surgery status: Secondary | ICD-10-CM

## 2018-04-04 DIAGNOSIS — D500 Iron deficiency anemia secondary to blood loss (chronic): Secondary | ICD-10-CM | POA: Insufficient documentation

## 2018-04-04 DIAGNOSIS — D649 Anemia, unspecified: Secondary | ICD-10-CM

## 2018-04-04 LAB — FERRITIN: Ferritin: 7 ng/mL — ABNORMAL LOW (ref 11–307)

## 2018-04-04 LAB — IRON AND TIBC
Iron: 21 ug/dL — ABNORMAL LOW (ref 28–170)
Saturation Ratios: 5 % — ABNORMAL LOW (ref 10.4–31.8)
TIBC: 463 ug/dL — ABNORMAL HIGH (ref 250–450)
UIBC: 442 ug/dL

## 2018-04-04 NOTE — Progress Notes (Signed)
Met with Jordan Mahoney before appointment with Dr. Tasia Catchings. Introduced nurse navigator services and provided contact information for future needs. Provided a copy of Ct scans and labs performed at [facility], including CEA. Surgery is scheduled for 03/21/18 with Dr. Lysle Pearl. Will continue to follow.

## 2018-04-04 NOTE — H&P (View-Only) (Signed)
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 female who was referred by Kathline Magic, MD for evaluation of above. First noted as positive  Hemoccult stool test.  Sequential diagnostic colonoscopy noted mass within the sigmoid, path coming back as adenocarcinoma.  Patient otherwise denies any symptoms.    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  Past Surgical History:  has a past surgical history that includes gastric bypass surgery (2010); cataract surgery (Right); Cataract extraction w/ intraocular lens implant and endocyclophotocoagulation; wrist surgery (Right); ceseran section (1978, 1986); Laparoscopic total hysterectomy; belly button hernia; cervical spinal fusion (2019); and neck surgery (06/2017).  Family History: None reported.   Social History:  reports that she has never smoked. She has never used smokeless tobacco. She reports that she does not drink alcohol or use drugs.  Current Medications: has a current medication list which includes the following prescription(s): calcium citrate-vitamin d3, cetirizine,  cholecalciferol, cyclobenzaprine, mometasone, omeprazole, riboflavin (vitamin b2), topiramate, alprazolam, metronidazole, and neomycin.  Allergies:       Allergies  Allergen Reactions  . Adhesive Unknown  . Adhesive Tape-Silicones Unknown  . Diazepam Other (See Comments)  . Latex, Natural Rubber Other (See Comments)  . Sulfa (Sulfonamide Antibiotics) Other (See Comments) and Unknown    Causes skin reaction ( on her mid back)  Pigmentation.   . Sulfasalazine Other (See Comments)    Causes skin reaction ( on her mid back)  Pigmentation.  Causes skin reaction ( on her mid back) Pigmentation.  Causes skin reaction ( on her mid back) Pigmentation.      ROS:  A 15 point review of systems was performed and pertinent positives and negatives noted in HPI   Objective:   BP 154/85   Pulse 78   Ht 167.6 cm (5\' 6" )   Wt 69.4 kg (153 lb)   BMI 24.69 kg/m   Constitutional :  alert, appears stated age, cooperative and no distress  Lymphatics/Throat:  no asymmetry, masses, or scars  Respiratory:  clear to auscultation bilaterally  Cardiovascular:  regular rate and rhythm  Gastrointestinal: soft, non-tender; bowel sounds normal; no masses,  no organomegaly.    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 The risks of general anesthetic, if used, includes MI, CVA, sudden death or even reaction to anesthetic medications also discussed. Alternatives include  continued observation.Benefits include possible symptom relief, preventing further decline in health and possible death.  Typical  post-op recovery time of additional days in hospital for observation afterwards also discussed.  Prep ordered. Will proceed with ERAS protocol as well. Pending medical clearance and workup as noted above.   The patientverbalized understanding and all questions were answered to the patient's satisfaction.  Xanax ordered for the pending CT c/a/p     Electronically signed by Benjamine Sprague, DO on 04/04/2018 2:23 PM

## 2018-04-04 NOTE — H&P (Signed)
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 female who was referred by Kathline Magic, MD for evaluation of above. First noted as positive  Hemoccult stool test.  Sequential diagnostic colonoscopy noted mass within the sigmoid, path coming back as adenocarcinoma.  Patient otherwise denies any symptoms.    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  Past Surgical History:  has a past surgical history that includes gastric bypass surgery (2010); cataract surgery (Right); Cataract extraction w/ intraocular lens implant and endocyclophotocoagulation; wrist surgery (Right); ceseran section (1978, 1986); Laparoscopic total hysterectomy; belly button hernia; cervical spinal fusion (2019); and neck surgery (06/2017).  Family History: None reported.   Social History:  reports that she has never smoked. She has never used smokeless tobacco. She reports that she does not drink alcohol or use drugs.  Current Medications: has a current medication list which includes the following prescription(s): calcium citrate-vitamin d3, cetirizine,  cholecalciferol, cyclobenzaprine, mometasone, omeprazole, riboflavin (vitamin b2), topiramate, alprazolam, metronidazole, and neomycin.  Allergies:       Allergies  Allergen Reactions  . Adhesive Unknown  . Adhesive Tape-Silicones Unknown  . Diazepam Other (See Comments)  . Latex, Natural Rubber Other (See Comments)  . Sulfa (Sulfonamide Antibiotics) Other (See Comments) and Unknown    Causes skin reaction ( on her mid back)  Pigmentation.   . Sulfasalazine Other (See Comments)    Causes skin reaction ( on her mid back)  Pigmentation.  Causes skin reaction ( on her mid back) Pigmentation.  Causes skin reaction ( on her mid back) Pigmentation.      ROS:  A 15 point review of systems was performed and pertinent positives and negatives noted in HPI   Objective:   BP 154/85   Pulse 78   Ht 167.6 cm (5\' 6" )   Wt 69.4 kg (153 lb)   BMI 24.69 kg/m   Constitutional :  alert, appears stated age, cooperative and no distress  Lymphatics/Throat:  no asymmetry, masses, or scars  Respiratory:  clear to auscultation bilaterally  Cardiovascular:  regular rate and rhythm  Gastrointestinal: soft, non-tender; bowel sounds normal; no masses,  no organomegaly.    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 The risks of general anesthetic, if used, includes MI, CVA, sudden death or even reaction to anesthetic medications also discussed. Alternatives include  continued observation.Benefits include possible symptom relief, preventing further decline in health and possible death.  Typical  post-op recovery time of additional days in hospital for observation afterwards also discussed.  Prep ordered. Will proceed with ERAS protocol as well. Pending medical clearance and workup as noted above.   The patientverbalized understanding and all questions were answered to the patient's satisfaction.  Xanax ordered for the pending CT c/a/p     Electronically signed by Benjamine Sprague, DO on 04/04/2018 2:23 PM

## 2018-04-04 NOTE — Progress Notes (Signed)
Patient here for initial visit. States she does not feel good. Appetite is poor.

## 2018-04-05 ENCOUNTER — Other Ambulatory Visit: Payer: Self-pay

## 2018-04-05 ENCOUNTER — Ambulatory Visit (INDEPENDENT_AMBULATORY_CARE_PROVIDER_SITE_OTHER): Payer: Medicare Other

## 2018-04-05 DIAGNOSIS — J309 Allergic rhinitis, unspecified: Secondary | ICD-10-CM | POA: Diagnosis not present

## 2018-04-05 NOTE — Progress Notes (Signed)
Patient came in today to get her allergy injections and informed me that she has been diagnosed with colon cancer. She is going to have surgery next week and will keep [place] updated.

## 2018-04-05 NOTE — Progress Notes (Signed)
Hematology/Oncology Consult note [facility] Telephone:([phone] [phone]   Patient Care Team: Sander Radon, NP as PCP - General (Family Medicine) Dorthula Rue., MD as Referring Physician (Otolaryngology) Clent Jacks, RN as Registered Nurse  REFERRING PROVIDER: Dr.Toledo / Gerarda Gunther 'CHIEF COMPLAINTS/REASON FOR VISIT:  Evaluation of rectalsigmoid cancer  HISTORY OF PRESENTING ILLNESS:  Jordan Mahoney is a  68 y.o.  female with PMH listed below who was referred to me for evaluation of rectosigmoid cancer. Patient was initially seen care physician for evaluation of fatigue.  Lab work-up showed that patient's anemic.  Hemoccult was obtained and was positive.  Patient was referred to [place] [facility] and was seen and evaluated by Dr. Alice Reichert.  Reports that she had 2-3 bowel movement daily sometimes mushy and sometimes formed.  Denies seeing any bright red blood in the stool.  Denies any associated abdominal pain, gastric discomfort.  Appetite seems to be stable.  History of gastric bypass in 2010.  Cholecystectomy in 2000.  ?Unintentional weight loss 5 to 6 pounds for the past 2 months.   prior records showed she weighs 149 pounds on 09/28/2017, weighed 160 pounds 05/03/2017 Today she weighs 153 pounds  Colonoscopy was obtained on 03/27/2018.  6 mm polyp found in sigmoid colon.  Polyp is sessile. An ulcerated partially obstructing mass was found in the rectosigmoid colon.  Mass was circumferential.  Measured 5 cm in length.  No bleeding was present.  Biopsy was taken.  Nonbleeding internal hemorrhoids were found during retroflexion.   Review of Systems  Constitutional: Positive for fatigue. Negative for appetite change, chills and fever.  HENT:   Negative for hearing loss and voice change.   Eyes: Negative for eye problems.  Respiratory: Negative for chest tightness and cough.   Cardiovascular: Negative for chest pain.    Gastrointestinal: Negative for abdominal distention, abdominal pain and blood in stool.  Endocrine: Negative for hot flashes.  Genitourinary: Negative for difficulty urinating and frequency.   Musculoskeletal: Negative for arthralgias.  Skin: Negative for itching and rash.  Neurological: Negative for extremity weakness.  Hematological: Negative for adenopathy.  Psychiatric/Behavioral: Negative for confusion. The patient is nervous/anxious.     MEDICAL HISTORY:  Past Medical History:  Diagnosis Date  . Allergic rhinoconjunctivitis   . HA (headache)   . Hearing loss    wearing bilateral aides    SURGICAL HISTORY: Past Surgical History:  Procedure Laterality Date  . belly button    . c sections    . CATARACT EXTRACTION Right   . ELBOW SURGERY Left   . GANGLION CYST EXCISION Left 01/22/2016   base of thumb  . neck fusion  07/04/2017   Dr. Carloyn Manner in [place]  . WRIST SURGERY Right     SOCIAL HISTORY: Social History   Socioeconomic History  . Marital status: Married    Spouse name: Not on file  . Number of children: 2  . Years of education: college  . Highest education level: Not on file  Occupational History    Comment: retired  Scientific laboratory technician  . Financial resource strain: Not on file  . Food insecurity:    Worry: Not on file    Inability: Not on file  . Transportation needs:    Medical: Not on file    Non-medical: Not on file  Tobacco Use  . Smoking status: Former Smoker    Last attempt to quit: 1983    Years since quitting: 37.1  . Smokeless  tobacco: Never Used  . Tobacco comment: Quit in 1985  Substance and Sexual Activity  . Alcohol use: Yes    Alcohol/week: 1.0 standard drinks    Types: 1 Cans of beer per week    Comment: Rare   . Drug use: No  . Sexual activity: Not on file  Lifestyle  . Physical activity:    Days per week: Not on file    Minutes per session: Not on file  . Stress: Not on file  Relationships  . Social connections:    Talks on phone:  Not on file    Gets together: Not on file    Attends religious service: Not on file    Active member of club or organization: Not on file    Attends meetings of clubs or organizations: Not on file    Relationship status: Not on file  . Intimate partner violence:    Fear of current or ex partner: Not on file    Emotionally abused: Not on file    Physically abused: Not on file    Forced sexual activity: Not on file  Other Topics Concern  . Not on file  Social History Narrative   Patient lives at home with her husband [name]).    Education two years of college.   Caffeine two glasses of tea.    FAMILY HISTORY: Family History  Problem Relation Age of Onset  . Diabetes Father   . Stroke Father   . Migraines Daughter   . Breast cancer Paternal Aunt   . Allergic rhinitis Neg Hx   . Angioedema Neg Hx   . Asthma Neg Hx   . Eczema Neg Hx   . Immunodeficiency Neg Hx   . Urticaria Neg Hx     ALLERGIES:  is allergic to latex; sulfa antibiotics; tape; and valium [diazepam].  MEDICATIONS:  Current Outpatient Medications  Medication Sig Dispense Refill  . ALPRAZolam (XANAX) 1 MG tablet Take 1 mg by mouth 2 (two) times daily as needed for anxiety or sleep. Take 1 tablet (1 mg total) by mouth 2 (two) times daily as needed for Sleep or Anxiety for up to 30 days    . azelastine (ASTELIN) 0.1 % nasal spray Place 2 sprays into both nostrils daily. (Patient taking differently: Place 2 sprays into both nostrils daily as needed for rhinitis (summer). ) 90 mL 1  . CALCIUM CITRATE PO Take 500 mg by mouth 2 (two) times daily. Bariatric    . dorzolamide (TRUSOPT) 2 % ophthalmic solution Place 1 drop into the right eye 2 (two) times daily.     [name] eletriptan (RELPAX) 40 MG tablet Take 1 tablet (40 mg total) by mouth as needed for migraine or headache. May repeat in 2 hours if headache persists or recurs. 27 tablet 3  . EPINEPHrine (EPIPEN 2-PAK) 0.3 mg/0.3 mL IJ SOAJ injection Inject 0.3 mLs (0.3 mg  total) into the muscle as needed (for severe life-threatening allergic reaction). 2 Device 1  . Ergocalciferol (VITAMIN D2) 2000 UNITS TABS Take 2,000 Units by mouth daily.     . fexofenadine (ALLEGRA) 180 MG tablet Take 1 tablet (180 mg total) by mouth daily. (Patient taking differently: Take 180 mg by mouth 2 (two) times daily. ) 90 tablet 1  . folic acid (FOLVITE) 1 MG tablet Take 1 mg by mouth daily.     [name] LOTEMAX 0.5 % ophthalmic suspension Place 1 drop into the right eye 2 (two) times daily.     [name]  Magnesium 400 MG CAPS Take 400 mg by mouth daily.    . Magnesium Oxide 400 MG CAPS Take 400 mg by mouth 2 (two) times daily.     . methotrexate (RHEUMATREX) 2.5 MG tablet Take 10 tablets (25 mg total) by mouth once a week.    . mometasone (NASONEX) 50 MCG/ACT nasal spray Place 2 sprays into the nose daily. Two sprays each in each nostril (Patient taking differently: Place 2 sprays into the nose daily. ) 51 g 3  . Multiple Vitamin (MULTIVITAMIN) tablet Take 1 tablet by mouth 2 (two) times daily. Bariatric    . Omega-3 Krill Oil 500 MG CAPS Take 1 capsule by mouth daily.     . Riboflavin 100 MG CAPS Take 100 mg by mouth daily. VIT B2    . solifenacin (VESICARE) 10 MG tablet Take 10 mg by mouth daily.    . timolol (TIMOPTIC) 0.5 % ophthalmic solution Place 1 drop into the right eye 2 (two) times daily.     [name] tiZANidine (ZANAFLEX) 4 MG capsule Take 4 mg by mouth 3 (three) times daily as needed for muscle spasms. Take 1 capsule (4 mg total) by mouth Three (3) times a day as needed    . topiramate (TOPAMAX) 100 MG tablet Take 1 tablet (100 mg total) by mouth 2 (two) times daily. 180 tablet 3  . vitamin C (ASCORBIC ACID) 500 MG tablet Take 500 mg by mouth 2 (two) times daily.    [name] gabapentin (NEURONTIN) 300 MG capsule Take 300 mg by mouth 2 (two) times daily.    [name] omeprazole (PRILOSEC) 20 MG capsule Take 20 mg by mouth 2 (two) times daily before a meal.     No current facility-administered medications  for this visit.      PHYSICAL EXAMINATION: ECOG PERFORMANCE STATUS: 1 - Symptomatic but completely ambulatory Vitals:   04/04/18 1039  BP: 134/82  Pulse: 87  Resp: 18  Temp: (!) 96.8 F (36 C)   Filed Weights   04/04/18 1039  Weight: 153 lb 4.8 oz (69.5 kg)    Physical Exam Constitutional:      General: She is not in acute distress. HENT:     Head: Normocephalic and atraumatic.  Eyes:     General: No scleral icterus.    Pupils: Pupils are equal, round, and reactive to light.  Neck:     Musculoskeletal: Normal range of motion and neck supple.  Cardiovascular:     Rate and Rhythm: Normal rate and regular rhythm.     Heart sounds: Normal heart sounds.  Pulmonary:     Effort: Pulmonary effort is normal. No respiratory distress.     Breath sounds: No wheezing.  Abdominal:     General: Bowel sounds are normal. There is no distension.     Palpations: Abdomen is soft. There is no mass.     Tenderness: There is no abdominal tenderness.  Musculoskeletal: Normal range of motion.        General: No deformity.  Skin:    General: Skin is warm and dry.     Findings: No erythema or rash.  Neurological:     Mental Status: She is alert and oriented to person, place, and time.     Cranial Nerves: No cranial nerve deficit.     Coordination: Coordination normal.  Psychiatric:        Behavior: Behavior normal.        Thought Content: Thought content normal.  RADIOGRAPHIC STUDIES: I have personally reviewed the radiological images as listed and agreed with the findings in the report. 04/03/2018 CT chest abdomen pelvis with contrast Rectosigmoid thickening.  No findings of hepatic metastatic disease or retroperitoneal lymph pulmonary findings or evidence of pulmonary metastatic disease.  A few scattered pulmonary nodules are likely benign.  Recommend attention on future scans.  Surgical changes from gastric bypass surgery but no complicating features were identified.  15 mm right  thyroid lobe lesion.  Recommend correlation with thyroid ultrasound examination.   LABORATORY DATA:  I have reviewed the data as listed No results found for: WBC, HGB, HCT, MCV, PLT No results for input(s): NA, K, CL, CO2, GLUCOSE, BUN, CREATININE, CALCIUM, GFRNONAA, GFRAA, PROT, ALBUMIN, AST, ALT, ALKPHOS, BILITOT, BILIDIR, IBILI in the last 8760 hours. Iron/TIBC/Ferritin/ %Sat    Component Value Date/Time   IRON 21 (L) 04/04/2018 1131   TIBC 463 (H) 04/04/2018 1131   FERRITIN 7 (L) 04/04/2018 1131   IRONPCTSAT 5 (L) 04/04/2018 1131     Preop CEA was obtained on 04/03/2018, at 1.8 04/03/2018, CBC showed hemoglobin 10.8, MCV 84.1, white count 7.8, platelet count 316  Colonoscopy pathology 03/27/2018, done at [facility], results scanned in [place]. 1) specimen 1 polypectomy excision : c sigmoid olonic mucosa with focal glandular epithelium and benign lymphoid aggregate.  No adenomatous glands identified.  Negative for dysplasia or malignancy. 2) specimen 2: Biopsy, rectosigmoid: Colonic tissue with invasive adenocarcinoma, moderately differentiated.   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 Iron deficiency anemia due to chronic blood loss    Image was independently reviewed by me. Discussed with patient about image findings and, lab results. The diagnosis of colorectal adenocarcinoma and care plan were discussed with patient in detail.   CT scan did not show distant metastasis. I would like to obtain MRI pelvis to further evaluate the location of mass, whether it is sigmoid or rectal cancer. Treatment for sigmoid colon caner and rectal cancer are very different.    #Normocytic anemia, recommend check iron panel. #History of gastric bypass.   Labs reviewed and the patient has severe iron deficiency anemia.Plan IV iron with Venofer 200mg  weekly x 4 doses. Allergy reactions/infusion reaction including anaphylactic reaction discussed with patient. Other  side effects include but not limited to high blood pressure, skin rash, weight gain, leg swelling, etc. Patient voices understanding and willing to proceed.  # Addendum, discussed with Dr.Sakai who agrees with postponing surgery and proceed with MRI pelvis w/wo contrast first.  Called patient and discussed with her about the change of the plan and she is in agreement.   Orders Placed This Encounter  Procedures  . Iron and TIBC    Standing Status:   Future    Number of Occurrences:   1    Standing Expiration Date:   04/05/2019  . Ferritin    Standing Status:   Future    Number of Occurrences:   1    Standing Expiration Date:   04/05/2019    All questions were answered. The patient knows to call the clinic with any problems questions or concerns. CC Dr. Alice Reichert, Dr. Lysle Pearl, Dr. Gordy Levan Return of visit: to be determined.  Thank you for this kind referral and the opportunity to participate in the care of this patient. A copy of today's note is routed to referring provider  Total face to face encounter time for this patient visit was 60 min. >50% of the time  was  spent in counseling and coordination of care.    Earlie Server, MD, PhD Hematology Oncology [facility] Pager- [phone] 04/05/2018

## 2018-04-06 ENCOUNTER — Inpatient Hospital Stay
Admission: RE | Admit: 2018-04-06 | Discharge: 2018-04-06 | Disposition: A | Discharge status: Home / Self Care | Payer: Medicare Other | Source: Ambulatory Visit

## 2018-04-06 ENCOUNTER — Telehealth: Payer: Self-pay

## 2018-04-06 NOTE — Pre-Procedure Instructions (Signed)
Called to do preoperative phone interview.  "My surgery is cancelled because I am extremely anemic." per Jordan Mahoney.

## 2018-04-06 NOTE — Telephone Encounter (Signed)
Called and spoke with spouse, [name]. Instructed on MRI date/time/instructions/location.  Reviewed iron infusion dates as well with him.  Sch 04/13/18 11:45 AM MR PELVIS W WO CONTRAST 45 [facility] MRI [facility]MR             Appt Notes: arrive @ 11:15am....Marland Kitchenlocation KP

## 2018-04-07 ENCOUNTER — Inpatient Hospital Stay: Payer: Medicare Other

## 2018-04-07 VITALS — BP 103/68 | HR 66 | Temp 95.3°F | Resp 20

## 2018-04-07 DIAGNOSIS — D500 Iron deficiency anemia secondary to blood loss (chronic): Secondary | ICD-10-CM

## 2018-04-07 DIAGNOSIS — C19 Malignant neoplasm of rectosigmoid junction: Secondary | ICD-10-CM | POA: Diagnosis not present

## 2018-04-07 MED ORDER — SODIUM CHLORIDE 0.9 % IV SOLN
Freq: Once | INTRAVENOUS | Status: AC
  Administered 2018-04-07: 13:00:00 via INTRAVENOUS
  Filled 2018-04-07: qty 250

## 2018-04-07 MED ORDER — IRON SUCROSE 20 MG/ML IV SOLN
200.0000 mg | Freq: Once | INTRAVENOUS | Status: AC
  Administered 2018-04-07: 200 mg via INTRAVENOUS
  Filled 2018-04-07: qty 10

## 2018-04-10 ENCOUNTER — Encounter: Admission: RE | Payer: Self-pay | Source: Home / Self Care

## 2018-04-10 ENCOUNTER — Inpatient Hospital Stay: Admission: RE | Admit: 2018-04-10 | Payer: Medicare Other | Source: Home / Self Care | Admitting: Surgery

## 2018-04-10 SURGERY — COLECTOMY, SIGMOID, LAPAROSCOPIC
Anesthesia: General

## 2018-04-12 ENCOUNTER — Ambulatory Visit
Admission: RE | Admit: 2018-04-12 | Discharge: 2018-04-12 | Disposition: A | Discharge status: Home / Self Care | Payer: Medicare Other | Source: Ambulatory Visit | Attending: Oncology | Admitting: Oncology

## 2018-04-12 DIAGNOSIS — C19 Malignant neoplasm of rectosigmoid junction: Secondary | ICD-10-CM

## 2018-04-12 MED ORDER — GADOBUTROL 1 MMOL/ML IV SOLN
6.0000 mL | Freq: Once | INTRAVENOUS | Status: AC | PRN
  Administered 2018-04-12: 6 mL via INTRAVENOUS

## 2018-04-13 ENCOUNTER — Ambulatory Visit: Admission: RE | Admit: 2018-04-13 | Payer: Medicare Other | Source: Ambulatory Visit

## 2018-04-13 ENCOUNTER — Inpatient Hospital Stay: Payer: Medicare Other

## 2018-04-13 NOTE — Progress Notes (Signed)
Tumor Board Documentation  Jordan Mahoney was presented by Dr Tasia Catchings at our Tumor Board on 04/13/2018, which included representatives from medical oncology, radiation oncology, surgical oncology, surgical, radiology, pathology, navigation, internal medicine, pharmacy, genetics, research, palliative care, pulmonology.  Jordan Mahoney currently presents as a new patient, for [place], for new positive pathology with history of the following treatments: surgical intervention(s).  Additionally, we reviewed previous medical and familial history, history of present illness, and recent lab results along with all available histopathologic and imaging studies. The tumor board considered available treatment options and made the following recommendations: Surgery Iron infusions then Surgery  The following procedures/referrals were also placed: No orders of the defined types were placed in this encounter.   Clinical Trial Status: not discussed   Staging used: AJCC Stage Group  AJCC Staging:       Group: Adenocarcinoma Rectosigmoid Junction National site-specific guidelines NCCN were discussed with respect to the case.  Tumor board is a meeting of clinicians from various specialty areas who evaluate and discuss patients for whom a multidisciplinary approach is being considered. Final determinations in the plan of care are those of the provider(s). The responsibility for follow up of recommendations given during tumor board is that of the provider.   Today's extended care, comprehensive team conference, Jordan Mahoney was not present for the discussion and was not examined.   Multidisciplinary Tumor Board is a multidisciplinary case peer review process.  Decisions discussed in the Multidisciplinary Tumor Board reflect the opinions of the specialists present at the conference without having examined the patient.  Ultimately, treatment and diagnostic decisions rest with the primary provider(s) and the patient.

## 2018-04-14 ENCOUNTER — Inpatient Hospital Stay: Payer: Medicare Other | Attending: Oncology

## 2018-04-14 ENCOUNTER — Telehealth: Payer: Self-pay

## 2018-04-14 ENCOUNTER — Ambulatory Visit: Payer: Self-pay | Admitting: Surgery

## 2018-04-14 VITALS — BP 114/82 | HR 79 | Resp 20

## 2018-04-14 DIAGNOSIS — D500 Iron deficiency anemia secondary to blood loss (chronic): Secondary | ICD-10-CM

## 2018-04-14 DIAGNOSIS — D509 Iron deficiency anemia, unspecified: Secondary | ICD-10-CM | POA: Insufficient documentation

## 2018-04-14 MED ORDER — SODIUM CHLORIDE 0.9 % IV SOLN
Freq: Once | INTRAVENOUS | Status: AC
  Administered 2018-04-14: 14:00:00 via INTRAVENOUS
  Filled 2018-04-14: qty 250

## 2018-04-14 MED ORDER — IRON SUCROSE 20 MG/ML IV SOLN
200.0000 mg | Freq: Once | INTRAVENOUS | Status: AC
  Administered 2018-04-14: 200 mg via INTRAVENOUS
  Filled 2018-04-14: qty 10

## 2018-04-14 NOTE — Telephone Encounter (Signed)
Notified Jordan Mahoney with MRI results below. Surgery has been scheduled for 3/2 and they have already heard from Dr. Ines Bloomer office regarding surgery.

## 2018-04-16 ENCOUNTER — Encounter: Payer: Self-pay | Admitting: Anesthesiology

## 2018-04-17 ENCOUNTER — Other Ambulatory Visit: Payer: Self-pay

## 2018-04-17 ENCOUNTER — Encounter
Admission: RE | Disposition: A | Discharge status: Home / Self Care | Payer: Self-pay | Source: Home / Self Care | Attending: Surgery

## 2018-04-17 ENCOUNTER — Ambulatory Visit: Payer: Medicare Other | Admitting: Anesthesiology

## 2018-04-17 ENCOUNTER — Encounter: Payer: Self-pay | Admitting: *Deleted

## 2018-04-17 ENCOUNTER — Inpatient Hospital Stay
Admission: RE | Admit: 2018-04-17 | Discharge: 2018-04-20 | DRG: 331 | Disposition: A | Discharge status: Home / Self Care | Payer: Medicare Other | Attending: Surgery | Admitting: Surgery

## 2018-04-17 DIAGNOSIS — C187 Malignant neoplasm of sigmoid colon: Principal | ICD-10-CM | POA: Diagnosis present

## 2018-04-17 DIAGNOSIS — Z90710 Acquired absence of both cervix and uterus: Secondary | ICD-10-CM

## 2018-04-17 DIAGNOSIS — Z9884 Bariatric surgery status: Secondary | ICD-10-CM

## 2018-04-17 DIAGNOSIS — R7303 Prediabetes: Secondary | ICD-10-CM | POA: Diagnosis present

## 2018-04-17 DIAGNOSIS — K219 Gastro-esophageal reflux disease without esophagitis: Secondary | ICD-10-CM | POA: Diagnosis present

## 2018-04-17 DIAGNOSIS — Z981 Arthrodesis status: Secondary | ICD-10-CM

## 2018-04-17 DIAGNOSIS — Z87891 Personal history of nicotine dependence: Secondary | ICD-10-CM | POA: Diagnosis not present

## 2018-04-17 HISTORY — PX: COLON RESECTION: SHX5231

## 2018-04-17 LAB — TYPE AND SCREEN
ABO/RH(D): O POS
Antibody Screen: NEGATIVE

## 2018-04-17 LAB — POCT I-STAT 4, (NA,K, GLUC, HGB,HCT)
Glucose, Bld: 121 mg/dL — ABNORMAL HIGH (ref 70–99)
HCT: 38 % (ref 36.0–46.0)
Hemoglobin: 12.9 g/dL (ref 12.0–15.0)
Potassium: 3.5 mmol/L (ref 3.5–5.1)
Sodium: 143 mmol/L (ref 135–145)

## 2018-04-17 LAB — ABO/RH: ABO/RH(D): O POS

## 2018-04-17 SURGERY — COLECTOMY, LEFT, LAPAROSCOPIC
Anesthesia: General

## 2018-04-17 MED ORDER — FENTANYL CITRATE (PF) 100 MCG/2ML IJ SOLN
INTRAMUSCULAR | Status: AC
  Administered 2018-04-17: 25 ug via INTRAVENOUS
  Filled 2018-04-17: qty 2

## 2018-04-17 MED ORDER — MORPHINE SULFATE (PF) 2 MG/ML IV SOLN
1.0000 mg | INTRAVENOUS | Status: DC | PRN
  Administered 2018-04-18: 1 mg via INTRAVENOUS
  Filled 2018-04-17 (×2): qty 1

## 2018-04-17 MED ORDER — MAGNESIUM OXIDE 400 MG PO CAPS: 400.0000 mg | ORAL_CAPSULE | Freq: Two times a day (BID) | ORAL | Status: DC

## 2018-04-17 MED ORDER — ROCURONIUM BROMIDE 50 MG/5ML IV SOLN
INTRAVENOUS | Status: AC
  Filled 2018-04-17: qty 1

## 2018-04-17 MED ORDER — LIDOCAINE HCL (CARDIAC) PF 100 MG/5ML IV SOSY
PREFILLED_SYRINGE | INTRAVENOUS | Status: DC | PRN
  Administered 2018-04-17: 50 mg via INTRAVENOUS

## 2018-04-17 MED ORDER — FOLIC ACID 1 MG PO TABS
1.0000 mg | ORAL_TABLET | Freq: Every day | ORAL | Status: DC
  Administered 2018-04-18 – 2018-04-20 (×3): 1 mg via ORAL
  Filled 2018-04-17 (×4): qty 1

## 2018-04-17 MED ORDER — RIBOFLAVIN 100 MG PO CAPS: 100.0000 mg | ORAL_CAPSULE | Freq: Every day | ORAL | Status: DC

## 2018-04-17 MED ORDER — DEXAMETHASONE SODIUM PHOSPHATE 10 MG/ML IJ SOLN
INTRAMUSCULAR | Status: AC
  Filled 2018-04-17: qty 1

## 2018-04-17 MED ORDER — FENTANYL CITRATE (PF) 100 MCG/2ML IJ SOLN
INTRAMUSCULAR | Status: DC | PRN
  Administered 2018-04-17 (×9): 50 ug via INTRAVENOUS

## 2018-04-17 MED ORDER — ALPRAZOLAM 0.5 MG PO TABS
ORAL_TABLET | ORAL | Status: AC
  Administered 2018-04-17: 1 mg via ORAL
  Filled 2018-04-17: qty 2

## 2018-04-17 MED ORDER — TOPIRAMATE 100 MG PO TABS
100.0000 mg | ORAL_TABLET | Freq: Two times a day (BID) | ORAL | Status: DC
  Administered 2018-04-17 – 2018-04-20 (×6): 100 mg via ORAL
  Filled 2018-04-17 (×7): qty 1

## 2018-04-17 MED ORDER — ONDANSETRON HCL 4 MG/2ML IJ SOLN: 4.0000 mg | Freq: Once | INTRAMUSCULAR | Status: DC | PRN

## 2018-04-17 MED ORDER — KETOROLAC TROMETHAMINE 30 MG/ML IJ SOLN
INTRAMUSCULAR | Status: DC | PRN
  Administered 2018-04-17: 15 mg via INTRAVENOUS

## 2018-04-17 MED ORDER — LIDOCAINE HCL (PF) 2 % IJ SOLN
INTRAMUSCULAR | Status: AC
  Filled 2018-04-17: qty 10

## 2018-04-17 MED ORDER — FENTANYL CITRATE (PF) 100 MCG/2ML IJ SOLN
INTRAMUSCULAR | Status: AC
  Filled 2018-04-17: qty 2

## 2018-04-17 MED ORDER — PHENYLEPHRINE HCL 10 MG/ML IJ SOLN
INTRAMUSCULAR | Status: AC
  Filled 2018-04-17: qty 1

## 2018-04-17 MED ORDER — SUCCINYLCHOLINE CHLORIDE 20 MG/ML IJ SOLN
INTRAMUSCULAR | Status: AC
  Filled 2018-04-17: qty 1

## 2018-04-17 MED ORDER — FLUTICASONE PROPIONATE 50 MCG/ACT NA SUSP
2.0000 | Freq: Every day | NASAL | Status: DC
  Filled 2018-04-17: qty 16

## 2018-04-17 MED ORDER — SODIUM CHLORIDE FLUSH 0.9 % IV SOLN
INTRAVENOUS | Status: AC
  Filled 2018-04-17: qty 40

## 2018-04-17 MED ORDER — ALPRAZOLAM 0.5 MG PO TABS
1.0000 mg | ORAL_TABLET | Freq: Two times a day (BID) | ORAL | Status: DC | PRN
  Administered 2018-04-17 (×2): 1 mg via ORAL
  Filled 2018-04-17: qty 2

## 2018-04-17 MED ORDER — FENTANYL CITRATE (PF) 250 MCG/5ML IJ SOLN
INTRAMUSCULAR | Status: AC
  Filled 2018-04-17: qty 5

## 2018-04-17 MED ORDER — DEXAMETHASONE SODIUM PHOSPHATE 10 MG/ML IJ SOLN
INTRAMUSCULAR | Status: DC | PRN
  Administered 2018-04-17: 10 mg via INTRAVENOUS

## 2018-04-17 MED ORDER — SUGAMMADEX SODIUM 200 MG/2ML IV SOLN
INTRAVENOUS | Status: DC | PRN
  Administered 2018-04-17: 136 mg via INTRAVENOUS

## 2018-04-17 MED ORDER — KETOROLAC TROMETHAMINE 30 MG/ML IJ SOLN
INTRAMUSCULAR | Status: AC
  Filled 2018-04-17: qty 1

## 2018-04-17 MED ORDER — ACETAMINOPHEN 10 MG/ML IV SOLN
INTRAVENOUS | Status: AC
  Filled 2018-04-17: qty 100

## 2018-04-17 MED ORDER — PANTOPRAZOLE SODIUM 40 MG PO TBEC
40.0000 mg | DELAYED_RELEASE_TABLET | Freq: Every day | ORAL | Status: DC
  Administered 2018-04-18 – 2018-04-20 (×3): 40 mg via ORAL
  Filled 2018-04-17 (×3): qty 1

## 2018-04-17 MED ORDER — HYDROCODONE-ACETAMINOPHEN 5-325 MG PO TABS
1.0000 | ORAL_TABLET | ORAL | Status: DC | PRN
  Administered 2018-04-17 – 2018-04-18 (×2): 1 via ORAL
  Filled 2018-04-17 (×2): qty 1

## 2018-04-17 MED ORDER — DORZOLAMIDE HCL 2 % OP SOLN
1.0000 [drp] | Freq: Two times a day (BID) | OPHTHALMIC | Status: DC
  Administered 2018-04-17 – 2018-04-20 (×5): 1 [drp] via OPHTHALMIC
  Filled 2018-04-17 (×2): qty 10

## 2018-04-17 MED ORDER — TIMOLOL MALEATE 0.5 % OP SOLN
1.0000 [drp] | Freq: Two times a day (BID) | OPHTHALMIC | Status: DC
  Administered 2018-04-17: 1 [drp] via OPHTHALMIC
  Filled 2018-04-17 (×3): qty 5

## 2018-04-17 MED ORDER — AZELASTINE HCL 0.1 % NA SOLN
2.0000 | Freq: Every day | NASAL | Status: DC | PRN
  Filled 2018-04-17: qty 30

## 2018-04-17 MED ORDER — FENTANYL CITRATE (PF) 100 MCG/2ML IJ SOLN
25.0000 ug | INTRAMUSCULAR | Status: DC | PRN
  Administered 2018-04-17 (×4): 25 ug via INTRAVENOUS

## 2018-04-17 MED ORDER — PROPOFOL 10 MG/ML IV BOLUS
INTRAVENOUS | Status: AC
  Filled 2018-04-17: qty 20

## 2018-04-17 MED ORDER — SODIUM CHLORIDE 0.9 % IV SOLN
1.0000 g | Freq: Once | INTRAVENOUS | Status: AC
  Administered 2018-04-17: 1 g via INTRAVENOUS
  Filled 2018-04-17: qty 1

## 2018-04-17 MED ORDER — IBUPROFEN 400 MG PO TABS: 600.0000 mg | ORAL_TABLET | Freq: Four times a day (QID) | ORAL | Status: DC | PRN

## 2018-04-17 MED ORDER — ARMC OPHTHALMIC DILATING DROPS
OPHTHALMIC | Status: AC
  Filled 2018-04-17: qty 0.5

## 2018-04-17 MED ORDER — ACETAMINOPHEN 10 MG/ML IV SOLN
INTRAVENOUS | Status: DC | PRN
  Administered 2018-04-17: 1000 mg via INTRAVENOUS

## 2018-04-17 MED ORDER — TRAMADOL HCL 50 MG PO TABS: 50.0000 mg | ORAL_TABLET | Freq: Four times a day (QID) | ORAL | Status: DC | PRN

## 2018-04-17 MED ORDER — BUPIVACAINE-EPINEPHRINE (PF) 0.5% -1:200000 IJ SOLN
INTRAMUSCULAR | Status: DC | PRN
  Administered 2018-04-17: 30 mL

## 2018-04-17 MED ORDER — VITAMIN D 25 MCG (1000 UNIT) PO TABS
2000.0000 [IU] | ORAL_TABLET | Freq: Every day | ORAL | Status: DC
  Administered 2018-04-18 – 2018-04-20 (×3): 2000 [IU] via ORAL
  Filled 2018-04-17 (×3): qty 2

## 2018-04-17 MED ORDER — GABAPENTIN 300 MG PO CAPS
300.0000 mg | ORAL_CAPSULE | Freq: Two times a day (BID) | ORAL | Status: DC
  Administered 2018-04-17 – 2018-04-20 (×6): 300 mg via ORAL
  Filled 2018-04-17 (×6): qty 1

## 2018-04-17 MED ORDER — HYDROMORPHONE HCL 1 MG/ML IJ SOLN
INTRAMUSCULAR | Status: DC | PRN
  Administered 2018-04-17 (×2): 0.5 mg via INTRAVENOUS

## 2018-04-17 MED ORDER — ONDANSETRON HCL 4 MG/2ML IJ SOLN
INTRAMUSCULAR | Status: DC | PRN
  Administered 2018-04-17: 4 mg via INTRAVENOUS

## 2018-04-17 MED ORDER — ONDANSETRON HCL 4 MG/2ML IJ SOLN
INTRAMUSCULAR | Status: AC
  Filled 2018-04-17: qty 2

## 2018-04-17 MED ORDER — BUPIVACAINE HCL (PF) 0.5 % IJ SOLN
INTRAMUSCULAR | Status: AC
  Filled 2018-04-17: qty 30

## 2018-04-17 MED ORDER — ADULT MULTIVITAMIN W/MINERALS CH
1.0000 | ORAL_TABLET | Freq: Two times a day (BID) | ORAL | Status: DC
  Administered 2018-04-18 – 2018-04-19 (×2): 1 via ORAL
  Filled 2018-04-17 (×11): qty 1

## 2018-04-17 MED ORDER — HYDROMORPHONE HCL 1 MG/ML IJ SOLN
INTRAMUSCULAR | Status: AC
  Filled 2018-04-17: qty 1

## 2018-04-17 MED ORDER — BUPIVACAINE LIPOSOME 1.3 % IJ SUSP
INTRAMUSCULAR | Status: AC
  Filled 2018-04-17: qty 20

## 2018-04-17 MED ORDER — SUGAMMADEX SODIUM 200 MG/2ML IV SOLN
INTRAVENOUS | Status: AC
  Filled 2018-04-17: qty 2

## 2018-04-17 MED ORDER — ENOXAPARIN SODIUM 40 MG/0.4ML ~~LOC~~ SOLN: 40.0000 mg | SUBCUTANEOUS | Status: DC

## 2018-04-17 MED ORDER — PROPOFOL 10 MG/ML IV BOLUS
INTRAVENOUS | Status: DC | PRN
  Administered 2018-04-17: 140 mg via INTRAVENOUS

## 2018-04-17 MED ORDER — ELETRIPTAN HYDROBROMIDE 40 MG PO TABS
40.0000 mg | ORAL_TABLET | ORAL | Status: DC | PRN
  Filled 2018-04-17: qty 1

## 2018-04-17 MED ORDER — SODIUM CHLORIDE 0.9 % IV SOLN
INTRAVENOUS | Status: DC | PRN
  Administered 2018-04-17: 60 mL

## 2018-04-17 MED ORDER — LORATADINE 10 MG PO TABS
10.0000 mg | ORAL_TABLET | Freq: Every day | ORAL | Status: DC
  Administered 2018-04-18 – 2018-04-20 (×3): 10 mg via ORAL
  Filled 2018-04-17 (×3): qty 1

## 2018-04-17 MED ORDER — LOTEPREDNOL ETABONATE 0.5 % OP SUSP
1.0000 [drp] | Freq: Two times a day (BID) | OPHTHALMIC | Status: DC
  Administered 2018-04-17 – 2018-04-20 (×5): 1 [drp] via OPHTHALMIC
  Filled 2018-04-17 (×2): qty 5

## 2018-04-17 MED ORDER — CALCIUM CITRATE 950 (200 CA) MG PO TABS
500.0000 mg | ORAL_TABLET | Freq: Two times a day (BID) | ORAL | Status: DC
  Administered 2018-04-17 – 2018-04-20 (×6): 475 mg via ORAL
  Filled 2018-04-17 (×7): qty 1

## 2018-04-17 MED ORDER — EPHEDRINE SULFATE 50 MG/ML IJ SOLN
INTRAMUSCULAR | Status: AC
  Filled 2018-04-17: qty 1

## 2018-04-17 MED ORDER — MAGNESIUM OXIDE 400 (241.3 MG) MG PO TABS
400.0000 mg | ORAL_TABLET | Freq: Every day | ORAL | Status: DC
  Administered 2018-04-18 – 2018-04-20 (×3): 400 mg via ORAL
  Filled 2018-04-17 (×3): qty 1

## 2018-04-17 MED ORDER — ROCURONIUM BROMIDE 100 MG/10ML IV SOLN
INTRAVENOUS | Status: DC | PRN
  Administered 2018-04-17: 10 mg via INTRAVENOUS
  Administered 2018-04-17 (×2): 5 mg via INTRAVENOUS
  Administered 2018-04-17 (×2): 10 mg via INTRAVENOUS
  Administered 2018-04-17: 20 mg via INTRAVENOUS
  Administered 2018-04-17 (×3): 5 mg via INTRAVENOUS
  Administered 2018-04-17: 45 mg via INTRAVENOUS

## 2018-04-17 MED ORDER — VITAMIN D2 50 MCG (2000 UT) PO TABS: 2000.0000 [IU] | ORAL_TABLET | Freq: Every day | ORAL | Status: DC

## 2018-04-17 MED ORDER — OMEGA-3 KRILL OIL 500 MG PO CAPS: 1.0000 | ORAL_CAPSULE | Freq: Every day | ORAL | Status: DC

## 2018-04-17 MED ORDER — ACETAMINOPHEN 500 MG PO TABS
1000.0000 mg | ORAL_TABLET | Freq: Four times a day (QID) | ORAL | Status: DC
  Administered 2018-04-17 – 2018-04-20 (×10): 1000 mg via ORAL
  Filled 2018-04-17 (×11): qty 2

## 2018-04-17 MED ORDER — OMEGA-3-ACID ETHYL ESTERS 1 G PO CAPS
1.0000 g | ORAL_CAPSULE | Freq: Two times a day (BID) | ORAL | Status: DC
  Filled 2018-04-17 (×4): qty 1

## 2018-04-17 MED ORDER — LACTATED RINGERS IV SOLN
INTRAVENOUS | Status: DC
  Administered 2018-04-17 (×3): via INTRAVENOUS

## 2018-04-17 MED ORDER — VITAMIN C 500 MG PO TABS
500.0000 mg | ORAL_TABLET | Freq: Two times a day (BID) | ORAL | Status: DC
  Administered 2018-04-17 – 2018-04-20 (×6): 500 mg via ORAL
  Filled 2018-04-17 (×6): qty 1

## 2018-04-17 MED ORDER — SUCCINYLCHOLINE CHLORIDE 20 MG/ML IJ SOLN
INTRAMUSCULAR | Status: DC | PRN
  Administered 2018-04-17: 80 mg via INTRAVENOUS

## 2018-04-17 SURGICAL SUPPLY — 72 items
BLADE SURG SZ10 CARB STEEL (BLADE) ×2 IMPLANT
BULB RESERV EVAC DRAIN JP 100C (MISCELLANEOUS) ×2 IMPLANT
CANISTER SUCT 1200ML W/VALVE (MISCELLANEOUS) ×2 IMPLANT
COVER MAYO STAND STRL (DRAPES) ×2 IMPLANT
COVER WAND RF STERILE (DRAPES) ×2 IMPLANT
DECANTER SPIKE VIAL GLASS SM (MISCELLANEOUS) IMPLANT
DERMABOND ADVANCED (GAUZE/BANDAGES/DRESSINGS) ×2
DERMABOND ADVANCED .7 DNX12 (GAUZE/BANDAGES/DRESSINGS) ×2 IMPLANT
DRAIN CHANNEL JP 15F RND 16 (MISCELLANEOUS) ×2 IMPLANT
DRAPE INCISE IOBAN 66X45 STRL (DRAPES) ×2 IMPLANT
DRAPE UNDER BUTTOCK W/FLU (DRAPES) ×2 IMPLANT
DRSG OPSITE POSTOP 3X4 (GAUZE/BANDAGES/DRESSINGS) ×4 IMPLANT
DRSG OPSITE POSTOP 4X10 (GAUZE/BANDAGES/DRESSINGS) ×2 IMPLANT
ELECT BLADE 6.5 EXT (BLADE) ×2 IMPLANT
ELECT CAUTERY BLADE 6.4 (BLADE) ×2 IMPLANT
ELECT REM PT RETURN 9FT ADLT (ELECTROSURGICAL) ×2
ELECTRODE REM PT RTRN 9FT ADLT (ELECTROSURGICAL) ×1 IMPLANT
GLOVE BIOGEL PI IND STRL 7.0 (GLOVE) ×8 IMPLANT
GLOVE BIOGEL PI INDICATOR 7.0 (GLOVE) ×8
GLOVE SURG SYN 7.0 (GLOVE) ×16 IMPLANT
GOWN STRL REUS W/ TWL LRG LVL3 (GOWN DISPOSABLE) ×8 IMPLANT
GOWN STRL REUS W/TWL LRG LVL3 (GOWN DISPOSABLE) ×8
HANDLE YANKAUER SUCT BULB TIP (MISCELLANEOUS) ×2 IMPLANT
HOLDER FOLEY CATH W/STRAP (MISCELLANEOUS) ×2 IMPLANT
IRRIGATION STRYKERFLOW (MISCELLANEOUS) ×1 IMPLANT
IRRIGATOR STRYKERFLOW (MISCELLANEOUS) ×2
IV NS 1000ML (IV SOLUTION) ×1
IV NS 1000ML BAXH (IV SOLUTION) ×1 IMPLANT
KITTNER LAPARASCOPIC 5X40 (MISCELLANEOUS) ×4 IMPLANT
L-HOOK LAP DISP 36CM (ELECTROSURGICAL) ×2
LHOOK LAP DISP 36CM (ELECTROSURGICAL) ×1 IMPLANT
NEEDLE HYPO 22GX1.5 SAFETY (NEEDLE) ×2 IMPLANT
NS IRRIG 1000ML POUR BTL (IV SOLUTION) ×2 IMPLANT
PACK COLON CLEAN CLOSURE (MISCELLANEOUS) ×2 IMPLANT
PACK LAP CHOLECYSTECTOMY (MISCELLANEOUS) ×2 IMPLANT
PENCIL ELECTRO HAND CTR (MISCELLANEOUS) ×2 IMPLANT
PORT ACCESS TROCAR AIRSEAL 5 (TROCAR) ×2 IMPLANT
RELOAD PROXIMATE 75MM BLUE (ENDOMECHANICALS) ×2 IMPLANT
RELOAD STAPLER BLUE 60MM (STAPLE) ×2 IMPLANT
RETRACTOR WOUND ALXS 18CM MED (MISCELLANEOUS) IMPLANT
RTRCTR WOUND ALEXIS O 18CM MED (MISCELLANEOUS)
SCISSORS METZENBAUM CVD 33 (INSTRUMENTS) ×2 IMPLANT
SET TRI-LUMEN FLTR TB AIRSEAL (TUBING) ×2 IMPLANT
SHEARS HARMONIC ACE PLUS 36CM (ENDOMECHANICALS) ×2 IMPLANT
SLEEVE ENDOPATH XCEL 5M (ENDOMECHANICALS) ×2 IMPLANT
SPONGE LAP 18X18 RF (DISPOSABLE) ×4 IMPLANT
STAPLE ECHEON FLEX 60 POW ENDO (STAPLE) ×2 IMPLANT
STAPLER PROXIMATE 75MM BLUE (STAPLE) ×2 IMPLANT
STAPLER RELOAD BLUE 60MM (STAPLE) ×4
STAPLER SKIN PROX 35W (STAPLE) ×2 IMPLANT
STAPLER SYS INTERNAL RELOAD SS (MISCELLANEOUS) ×2 IMPLANT
STAPLER TA28 THK CONTR EEA XL (STAPLE) ×2 IMPLANT
SUT ETHILON 3-0 FS-10 30 BLK (SUTURE) ×2
SUT MNCRL 4-0 (SUTURE) ×2
SUT MNCRL 4-0 27XMFL (SUTURE) ×2
SUT PDS AB 0 CT1 27 (SUTURE) ×4 IMPLANT
SUT SILK 2 0 (SUTURE) ×1
SUT SILK 2-0 30XBRD TIE 12 (SUTURE) ×1 IMPLANT
SUT SILK 3-0 (SUTURE) ×4 IMPLANT
SUT VIC AB 2-0 SH 27 (SUTURE) ×1
SUT VIC AB 2-0 SH 27XBRD (SUTURE) ×1 IMPLANT
SUT VIC AB 3-0 SH 27 (SUTURE) ×1
SUT VIC AB 3-0 SH 27X BRD (SUTURE) ×1 IMPLANT
SUT VICRYL 0 AB UR-6 (SUTURE) ×4 IMPLANT
SUTURE EHLN 3-0 FS-10 30 BLK (SUTURE) ×1 IMPLANT
SUTURE MNCRL 4-0 27XMF (SUTURE) ×2 IMPLANT
SYR 30ML LL (SYRINGE) ×2 IMPLANT
SYS LAPSCP GELPORT 120MM (MISCELLANEOUS) ×2
SYSTEM LAPSCP GELPORT 120MM (MISCELLANEOUS) ×1 IMPLANT
TRAY FOLEY MTR SLVR 16FR STAT (SET/KITS/TRAYS/PACK) ×2 IMPLANT
TROCAR XCEL 12X100 BLDLESS (ENDOMECHANICALS) ×2 IMPLANT
TROCAR XCEL NON-BLD 5MMX100MML (ENDOMECHANICALS) ×2 IMPLANT

## 2018-04-17 NOTE — Op Note (Addendum)
Preoperative diagnosis: Carcinoma of sigmoid colon  Postoperative diagnosis: same  Procedure: Laparoscopic hand-assisted low Anterior Resection  Anesthesia: GETA  Surgeon: Lysle Pearl Assistant: Cintron-Diaz  Wound Classification: Clean Contaminated  Indications: Patient is a 68 y.o. female with  carcinoma involving the distal sigmoid colon. Resection was indicated for oncologic treatment.   Specimen: Sigmoid colon, distal sigmoid colon, anastomotic sites  Complications: None  Estimated Blood Loss:  250 mL  Findings: 1. Palpable mass at the distal sigmoid section, deep to some scarring from previous surgery 2. Tension free anastomosis 3. Adequate gross circulation on anastomosis 4. No gross metastatic disease identified 5. Adequate hemostasis 6.  No visible distal tattoo  Details of operation: The patient was brought to the operating room and placed on the operating table in the supine position. The abdomen was clipped, prepped with 2% chlorhexidine solution, and draped in the standard fashion. Foley placed.  A time-out was completed verifying correct patient, procedure, site, positioning, and implant(s) and/or special equipment prior to beginning this procedure. A vertical 5 cm infraumbilical incision was used to access the peritoneal cavity and a Gelport was placed.  Under direct visualization a 59mm airseal RUQ trocar was introduced and pneumoperitoneum was achieved. A 12 mm trocar was introduced on the RLQ area. The patient was then positioned in 30 reverse Trendelenburg position  to allow for small bowel to drop clear of the operative field. With a hand assisted technique, the sigmoid colon where tattoo was visible  was grasped with the right hand of the operating surgeon and lifted up to tent the mesentery.  An additional 48mm port was placed in LUQ for better visualization.  The lateral attachments along the white line of toldt was taken down using the harmonic via the laparaoscopic  technique.  Once the colon was mobilized and pulled through the hand port, a hole was made in the mesentary 5cm distal to the marked area.  Colon then divided using GIA vascular load. The mesentary was then divided using harmonic, taking care to resect enough mesentery to ensure adequate lymph node samples are obtained.  This dissection was carried 5 cm proximal to the tattoo as well.  At this point the mesentery was transected at its base and taken off operative field.  Inspection of the colon noted that there was no polyp within the specimen.  Further investigation of the remaining rectal stump did not note any additional signs of a tattoo.  However after additional dissection around the stump was performed laparoscopically down to the pelvic brim area, a palpable mass was finally noted.  Still no signs of obvious tattooing at the distal aspect.  Dissection was further carried down to approximately 3 cm above the pelvic brim, at which point it was felt that the distal segment from the palpable tumor was approximately 5 cm.  A hole in the mesentery was created here and a Endo GIA stapler was used to transect the proximal rectal stump that included the palpable mass.  The remaining mesentery attached to this area was ligated down to its base, and then removed off the operative field.  Inspection of the rectal stump on the back table did note the mass within the specimen this time.  Reinspection of the area noted good hemostasis, but interestingly no sign of additional tattooing in the distal aspect or the rectum.  Attention was then turned to mobilization of the descending colon along the white line of Toldt laterally using the harmonic.  The splenic flexure was then taken  down meticulously using combination of blunt finger dissection and the harmonic.  It was all performed laparoscopically after the splenic flexure was mobilized attempt dissection laparoscopically was made along the transverse colon, but due to  the previous Roux-en-Y bypass surgery anatomy was difficult to discern via a laparoscopic approach.  Decision was made at this point to abort the laparoscopic portion and to convert to a open approach for further dissection of the colon in order to achieve a tension-free anastomosis.    The cannulas and GelPort were all removed, abdomen desufflated, and the midline and incision was extended superiorly beyond the umbilicus.  Bookwalter retractors were used to gain visualization of the operative field.  Inspection of the transverse colon area, Roux-en-Y anastomosis was noted with the Roux limb tunneling underneath the transverse colon.  The area around this was dissected to gain further delivery of the transverse colon.  Additional omentum attachments at this area was also ligated using harmonic.  Once dissection was completed in this area, the distal end of the descending colon was noted to easily reach the rectal stump within the pelvis.    The anvil portion of the EEA stapler was then pursestring sutured into the distal end, and care was noted to dissect off any excess tissue surrounding the anvil.  An EEA stapler was then introduced through the rectum and a new end-to-end anastomosis was created under direct visualization.  During the anastomosis process there was noted to be minimal tension, and no excess tissue along the staple lines.  Leak test was then performed by inserting a rigid proctoscope and insufflating the newly created anastomosis under saline.  No bubbles were confirmed at the staple line.  2 donuts were also confirmed within the stapler itself.  Due to the excessive dissection that was near along the lateral aspect as well as within the pelvic brim, a 15 mm Blake drain was placed within this area, through the left upper quadrant port site, secured in place with 3-0 nylon sutures.  The abdomen was then irrigated and suctioned out excess fluid, hemostasis visually confirmed.    Clean closure  protocol was then initiated by sectioning of the area with a clean set of drapes, gloves and gowns changed, and a new set of instruments prepared.  The fascia was closed with running PDS 0 x2.   Exparel expanded with Marcaine and normal saline infused to incisions prior to closing.  3-0 vicryl in an interrupted fashion used to close subcutaneous layer at the midline incision prior to closing all skin incisions with stapler.  Wounds then dressed with honeycomb sponge, 2x2, and tegarderm.   The sponge and instrument count correct.  The patient tolerated the procedure well and was transferred to the post-anesthesia care unit in stable condition.  Foley placed prior to the procedure remained in place.

## 2018-04-17 NOTE — Anesthesia Post-op Follow-up Note (Signed)
Anesthesia QCDR form completed.        

## 2018-04-17 NOTE — Anesthesia Preprocedure Evaluation (Signed)
Anesthesia Evaluation  Patient identified by MRN, date of birth, ID band Patient awake    Reviewed: Allergy & Precautions, NPO status , Patient's Chart, lab work & pertinent test results, reviewed documented beta blocker date and time   Airway Mallampati: II  TM Distance: >3 FB     Dental  (+) Chipped   Pulmonary former smoker,           Cardiovascular      Neuro/Psych  Headaches,    GI/Hepatic GERD  Controlled,  Endo/Other    Renal/GU      Musculoskeletal   Abdominal   Peds  Hematology  (+) anemia ,   Anesthesia Other Findings Dec hearing.  Reproductive/Obstetrics                             Anesthesia Physical Anesthesia Plan  ASA: III  Anesthesia Plan: General   Post-op Pain Management:    Induction: Intravenous  PONV Risk Score and Plan:   Airway Management Planned: Oral ETT  Additional Equipment:   Intra-op Plan:   Post-operative Plan:   Informed Consent: I have reviewed the patients History and Physical, chart, labs and discussed the procedure including the risks, benefits and alternatives for the proposed anesthesia with the patient or authorized representative who has indicated his/her understanding and acceptance.       Plan Discussed with: CRNA  Anesthesia Plan Comments:         Anesthesia Quick Evaluation

## 2018-04-17 NOTE — Anesthesia Procedure Notes (Signed)
Procedure Name: Intubation Performed by: Demetrius Charity, CRNA Pre-anesthesia Checklist: Patient identified, Patient being monitored, Timeout performed, Emergency Drugs available and Suction available Patient Re-evaluated:Patient Re-evaluated prior to induction Oxygen Delivery Method: Circle system utilized Preoxygenation: Pre-oxygenation with 100% oxygen Induction Type: IV induction Ventilation: Mask ventilation without difficulty Laryngoscope Size: Mac and 4 Grade View: Grade I Tube type: Oral Tube size: 7.0 mm Number of attempts: 1 Airway Equipment and Method: Stylet Placement Confirmation: ETT inserted through vocal cords under direct vision,  positive ETCO2 and breath sounds checked- equal and bilateral Secured at: 21 cm Tube secured with: Tape Dental Injury: Teeth and Oropharynx as per pre-operative assessment

## 2018-04-17 NOTE — Interval H&P Note (Signed)
History and Physical Interval Note:  04/17/2018 7:26 AM  Jordan Mahoney  has presented today for surgery, with the diagnosis of MALIGNANT NEOPLASM OF SIGMOID COLON  The various methods of treatment have been discussed with the patient and family. After consideration of risks, benefits and other options for treatment, the patient has consented to  Procedure(s): LAPAROSCOPIC COLON RESECTION POSSIBLE OSTOMY (N/A) as a surgical intervention .  The patient's history has been reviewed, patient examined, no change in status, stable for surgery.  I have reviewed the patient's chart and labs.  Questions were answered to the patient's satisfaction.     Kailee Essman Lysle Pearl

## 2018-04-17 NOTE — Transfer of Care (Signed)
Immediate Anesthesia Transfer of Care Note  Patient: Jordan Mahoney  Procedure(s) Performed: LAPAROSCOPIC COLON RESECTION POSSIBLE OSTOMY (N/A )  Patient Location: PACU  Anesthesia Type:General  Level of Consciousness: drowsy  Airway & Oxygen Therapy: Patient Spontanous Breathing and Patient connected to face mask oxygen  Post-op Assessment: Report given to RN and Post -op Vital signs reviewed and stable  Post vital signs: Reviewed and stable  Last Vitals:  Vitals Value Taken Time  BP 118/79 04/17/2018  1:59 PM  Temp    Pulse 87 04/17/2018  2:01 PM  Resp 10 04/17/2018  2:01 PM  SpO2 99 % 04/17/2018  2:01 PM  Vitals shown include unvalidated device data.  Last Pain:  Vitals:   04/17/18 0622  TempSrc: Tympanic  PainSc: 0-No pain         Complications: No apparent anesthesia complications

## 2018-04-18 ENCOUNTER — Encounter: Payer: Self-pay | Admitting: Surgery

## 2018-04-18 LAB — CBC
HCT: 28.7 % — ABNORMAL LOW (ref 36.0–46.0)
Hemoglobin: 8.8 g/dL — ABNORMAL LOW (ref 12.0–15.0)
MCH: 25.7 pg — ABNORMAL LOW (ref 26.0–34.0)
MCHC: 30.7 g/dL (ref 30.0–36.0)
MCV: 83.9 fL (ref 80.0–100.0)
Platelets: 254 10*3/uL (ref 150–400)
RBC: 3.42 MIL/uL — ABNORMAL LOW (ref 3.87–5.11)
RDW: 18.7 % — ABNORMAL HIGH (ref 11.5–15.5)
WBC: 8.9 10*3/uL (ref 4.0–10.5)
nRBC: 0 % (ref 0.0–0.2)

## 2018-04-18 LAB — BASIC METABOLIC PANEL
Anion gap: 6 (ref 5–15)
BUN: 12 mg/dL (ref 8–23)
CO2: 20 mmol/L — ABNORMAL LOW (ref 22–32)
Calcium: 7.8 mg/dL — ABNORMAL LOW (ref 8.9–10.3)
Chloride: 114 mmol/L — ABNORMAL HIGH (ref 98–111)
Creatinine, Ser: 0.73 mg/dL (ref 0.44–1.00)
GFR calc non Af Amer: >60 mL/min (ref >60)
Glucose, Bld: 109 mg/dL — ABNORMAL HIGH (ref 70–99)
Potassium: 3.6 mmol/L (ref 3.5–5.1)
SODIUM: 140 mmol/L (ref 135–145)

## 2018-04-18 LAB — HEMOGLOBIN AND HEMATOCRIT, BLOOD
HCT: 27.5 % — ABNORMAL LOW (ref 36.0–46.0)
HEMOGLOBIN: 8.5 g/dL — AB (ref 12.0–15.0)

## 2018-04-18 NOTE — Progress Notes (Signed)
Subjective:  CC:  Jordan Mahoney is a 68 y.o. female  Hospital stay day 1, 1 Day Post-Op LAR for sigmoid colon CA  HPI: No issues overnight.  Slept well, sore around port sites, tolerating some water.  ROS:  General: Denies weight loss, weight gain, fatigue, fevers, chills, and night sweats. Heart: Denies chest pain, palpitations, racing heart, irregular heartbeat, leg pain or swelling, and decreased activity tolerance. Respiratory: Denies breathing difficulty, shortness of breath, wheezing, cough, and sputum. GI: Denies change in appetite, heartburn, nausea, vomiting, constipation, diarrhea, and blood in stool. GU: Denies difficulty urinating, pain with urinating, urgency, frequency, blood in urine.   Objective:      Temp:  [97.2 F (36.2 C)-99.2 F (37.3 C)] 98.7 F (37.1 C) (03/03 0436) Pulse Rate:  [65-95] 80 (03/03 0436) Resp:  [7-20] 18 (03/03 0436) BP: (96-136)/(55-85) 97/63 (03/03 0436) SpO2:  [94 %-100 %] 97 % (03/03 0436)     Height: 5\' 5"  (165.1 cm) Weight: 68 kg BMI (Calculated): 24.96   Intake/Output this shift:   Intake/Output Summary (Last 24 hours) at 04/18/2018 1011 Last data filed at 04/18/2018 0800 Gross per 24 hour  Intake 1550 ml  Output 715 ml  Net 835 ml    JP with bloody drainage, 135ml    Constitutional :  alert, cooperative, appears stated age and no distress  Respiratory:  clear to auscultation bilaterally  Cardiovascular:  regular rate and rhythm  Gastrointestinal: abnormal findings:  soft, no guarding, but focal tenderness around port sites.  midline incision with minimal tenderness, dressings intact.  .   Skin: Cool and moist.   Psychiatric: Normal affect, non-agitated, not confused       LABS:  CMP Latest Ref Rng & Units 04/18/2018 04/17/2018  Glucose 70 - 99 mg/dL 109(H) 121(H)  BUN 8 - 23 mg/dL 12 -  Creatinine 0.44 - 1.00 mg/dL 0.73 -  Sodium 135 - 145 mmol/L 140 143  Potassium 3.5 - 5.1 mmol/L 3.6 3.5  Chloride 98 - 111 mmol/L 114(H)  -  CO2 22 - 32 mmol/L 20(L) -  Calcium 8.9 - 10.3 mg/dL 7.8(L) -   CBC Latest Ref Rng & Units 04/18/2018 04/17/2018  WBC 4.0 - 10.5 K/uL 8.9 -  Hemoglobin 12.0 - 15.0 g/dL 8.8(L) 12.9  Hematocrit 36.0 - 46.0 % 28.7(L) 38.0  Platelets 150 - 400 K/uL 254 -    RADS: N/A Assessment:   S/P LAR for colon CA.  Doing well overall.  Pull foley, DTV.  Continue clears for today due to some bleeding noted in JP.  q4hr drain checks and repeat H&H in pm.  Hold lovenox for now.

## 2018-04-18 NOTE — Progress Notes (Signed)
Foley discontinued per order. Pt is due to void by 1400 04/18/2018. RN will continue to encourage ambulation and PO intake.   Kiersten Coss [employer]

## 2018-04-19 LAB — CBC
HEMATOCRIT: 27.6 % — AB (ref 36.0–46.0)
HEMOGLOBIN: 8.6 g/dL — AB (ref 12.0–15.0)
MCH: 26.3 pg (ref 26.0–34.0)
MCHC: 31.2 g/dL (ref 30.0–36.0)
MCV: 84.4 fL (ref 80.0–100.0)
Platelets: 227 10*3/uL (ref 150–400)
RBC: 3.27 MIL/uL — ABNORMAL LOW (ref 3.87–5.11)
RDW: 19 % — ABNORMAL HIGH (ref 11.5–15.5)
WBC: 8.9 10*3/uL (ref 4.0–10.5)
nRBC: 0 % (ref 0.0–0.2)

## 2018-04-19 LAB — BASIC METABOLIC PANEL
Anion gap: 5 (ref 5–15)
BUN: 7 mg/dL — ABNORMAL LOW (ref 8–23)
CO2: 21 mmol/L — AB (ref 22–32)
Calcium: 8 mg/dL — ABNORMAL LOW (ref 8.9–10.3)
Chloride: 118 mmol/L — ABNORMAL HIGH (ref 98–111)
Creatinine, Ser: 0.6 mg/dL (ref 0.44–1.00)
GFR calc Af Amer: >60 mL/min (ref >60)
GFR calc non Af Amer: >60 mL/min (ref >60)
Glucose, Bld: 99 mg/dL (ref 70–99)
Potassium: 3.4 mmol/L — ABNORMAL LOW (ref 3.5–5.1)
Sodium: 144 mmol/L (ref 135–145)

## 2018-04-19 LAB — C DIFFICILE QUICK SCREEN W PCR REFLEX
C Diff antigen: NEGATIVE
C Diff interpretation: NOT DETECTED
C Diff toxin: NEGATIVE

## 2018-04-19 MED ORDER — ENOXAPARIN SODIUM 40 MG/0.4ML ~~LOC~~ SOLN
40.0000 mg | SUBCUTANEOUS | Status: DC
  Administered 2018-04-19: 40 mg via SUBCUTANEOUS
  Filled 2018-04-19: qty 0.4

## 2018-04-19 MED ORDER — SIMETHICONE 80 MG PO CHEW
80.0000 mg | CHEWABLE_TABLET | Freq: Four times a day (QID) | ORAL | Status: DC | PRN
  Administered 2018-04-19: 80 mg via ORAL
  Filled 2018-04-19 (×3): qty 1

## 2018-04-19 NOTE — Anesthesia Postprocedure Evaluation (Signed)
Anesthesia Post Note  Patient: Jordan Mahoney  Procedure(s) Performed: LAPAROSCOPIC COLON RESECTION POSSIBLE OSTOMY (N/A )  Patient location during evaluation: PACU Anesthesia Type: General Level of consciousness: awake and alert Pain management: pain level controlled Vital Signs Assessment: post-procedure vital signs reviewed and stable Respiratory status: spontaneous breathing, nonlabored ventilation and respiratory function stable Cardiovascular status: blood pressure returned to baseline and stable Postop Assessment: no apparent nausea or vomiting Anesthetic complications: no     Last Vitals:  Vitals:   04/19/18 0400 04/19/18 0830  BP: 108/61 115/68  Pulse: 68 67  Resp: 16 17  Temp: 37.1 C 37.1 C  SpO2: 96% 100%    Last Pain:  Vitals:   04/19/18 1000  TempSrc:   PainSc: [place]

## 2018-04-19 NOTE — Progress Notes (Signed)
Subjective:  CC:  Jordan Mahoney is a 68 y.o. female  Hospital stay day 2, 2 Days Post-Op LAR for sigmoid colon CA  HPI: No issues overnight.  Increased stooling, but also having gas pain and some burping.  ROS:  General: Denies weight loss, weight gain, fatigue, fevers, chills, and night sweats. Heart: Denies chest pain, palpitations, racing heart, irregular heartbeat, leg pain or swelling, and decreased activity tolerance. Respiratory: Denies breathing difficulty, shortness of breath, wheezing, cough, and sputum. GI: Denies change in appetite, heartburn, nausea, vomiting, constipation, diarrhea, and blood in stool. GU: Denies difficulty urinating, pain with urinating, urgency, frequency, blood in urine.   Objective:      Temp:  [97.7 F (36.5 C)-98.8 F (37.1 C)] 98.8 F (37.1 C) (03/04 0830) Pulse Rate:  [67-77] 67 (03/04 0830) Resp:  [16-20] 17 (03/04 0830) BP: (108-115)/(56-69) 115/68 (03/04 0830) SpO2:  [96 %-100 %] 100 % (03/04 0830)     Height: 5\' 5"  (165.1 cm) Weight: 68 kg BMI (Calculated): 24.96   Intake/Output this shift:   Intake/Output Summary (Last 24 hours) at 04/19/2018 1050 Last data filed at 04/19/2018 0600 Gross per 24 hour  Intake 480 ml  Output 2250 ml  Net -1770 ml    JP with serosanguinous drainage  Constitutional :  alert, cooperative, appears stated age and no distress  Respiratory:  clear to auscultation bilaterally  Cardiovascular:  regular rate and rhythm  Gastrointestinal: abnormal findings:  soft, no guarding, but focal tenderness around port sites.  midline incision with minimal tenderness, dressings intact.  .   Skin: Cool and moist.   Psychiatric: Normal affect, non-agitated, not confused       LABS:  CMP Latest Ref Rng & Units 04/19/2018 04/18/2018 04/17/2018  Glucose 70 - 99 mg/dL 99 109(H) 121(H)  BUN 8 - 23 mg/dL 7(L) 12 -  Creatinine 0.44 - 1.00 mg/dL 0.60 0.73 -  Sodium 135 - 145 mmol/L 144 140 143  Potassium 3.5 - 5.1 mmol/L 3.4(L)  3.6 3.5  Chloride 98 - 111 mmol/L 118(H) 114(H) -  CO2 22 - 32 mmol/L 21(L) 20(L) -  Calcium 8.9 - 10.3 mg/dL 8.0(L) 7.8(L) -   CBC Latest Ref Rng & Units 04/19/2018 04/18/2018 04/18/2018  WBC 4.0 - 10.5 K/uL 8.9 - 8.9  Hemoglobin 12.0 - 15.0 g/dL 8.6(L) 8.5(L) 8.8(L)  Hematocrit 36.0 - 46.0 % 27.6(L) 27.5(L) 28.7(L)  Platelets 150 - 400 K/uL 227 - 254    RADS: N/A Assessment:   S/P LAR for colon CA.  Doing well overall.  Hemoglobin stable, abodminal exam benign.  Pt requested soft diet first for today due to increased stooling and bloating sensation.  Will prescribe gas-x and also send for c.diff.  Continue to monitor.  Restart lovenox for DVT prophylaxis.

## 2018-04-20 MED ORDER — HYDROCODONE-ACETAMINOPHEN 5-325 MG PO TABS: 1.0000 | ORAL_TABLET | Freq: Four times a day (QID) | ORAL | 0 refills | Status: AC | PRN

## 2018-04-20 MED ORDER — ACETAMINOPHEN 325 MG PO TABS: 650.0000 mg | ORAL_TABLET | Freq: Three times a day (TID) | ORAL | 0 refills | Status: AC | PRN

## 2018-04-20 MED ORDER — POTASSIUM CHLORIDE CRYS ER 20 MEQ PO TBCR
20.0000 meq | EXTENDED_RELEASE_TABLET | ORAL | Status: DC
  Administered 2018-04-20: 20 meq via ORAL
  Filled 2018-04-20: qty 1

## 2018-04-20 MED ORDER — SODIUM CHLORIDE 0.9 % IV SOLN
200.0000 mg | Freq: Once | INTRAVENOUS | Status: AC
  Administered 2018-04-20: 200 mg via INTRAVENOUS
  Filled 2018-04-20: qty 10

## 2018-04-20 MED ORDER — DOCUSATE SODIUM 100 MG PO CAPS: 100.0000 mg | ORAL_CAPSULE | Freq: Two times a day (BID) | ORAL | 0 refills | Status: AC | PRN

## 2018-04-20 MED ORDER — IBUPROFEN 800 MG PO TABS: 800.0000 mg | ORAL_TABLET | Freq: Three times a day (TID) | ORAL | 0 refills | Status: DC | PRN

## 2018-04-20 NOTE — Progress Notes (Signed)
Jordan Mahoney to be D/C'd Home per MD order.  Discussed prescriptions and follow up appointments with the patient. Prescriptions given to patient, medication list explained in detail. Pt verbalized understanding.  Allergies as of 04/20/2018      Reactions   Latex    Sulfa Antibiotics Other (See Comments)   Causes skin reaction ( on her mid back)  Pigmentation.    Tape    Valium [diazepam]       Medication List    STOP taking these medications   methotrexate 2.5 MG tablet Commonly known as:  RHEUMATREX     TAKE these medications   acetaminophen 325 MG tablet Commonly known as:  TYLENOL Take 2 tablets (650 mg total) by mouth every 8 (eight) hours as needed for up to 30 days for mild pain.   ALPRAZolam 1 MG tablet Commonly known as:  XANAX Take 1 mg by mouth 2 (two) times daily as needed for anxiety or sleep. Take 1 tablet (1 mg total) by mouth 2 (two) times daily as needed for Sleep or Anxiety for up to 30 days   azelastine 0.1 % nasal spray Commonly known as:  ASTELIN Place 2 sprays into both nostrils daily. What changed:    when to take this  reasons to take this   CALCIUM CITRATE PO Take 500 mg by mouth 2 (two) times daily. Bariatric   docusate sodium 100 MG capsule Commonly known as:  COLACE Take 1 capsule (100 mg total) by mouth 2 (two) times daily as needed for up to 10 days for mild constipation.   dorzolamide 2 % ophthalmic solution Commonly known as:  TRUSOPT Place 1 drop into the right eye 2 (two) times daily.   eletriptan 40 MG tablet Commonly known as:  RELPAX Take 1 tablet (40 mg total) by mouth as needed for migraine or headache. May repeat in 2 hours if headache persists or recurs.   EPINEPHrine 0.3 mg/0.3 mL Soaj injection Commonly known as:  EPIPEN 2-PAK Inject 0.3 mLs (0.3 mg total) into the muscle as needed (for severe life-threatening allergic reaction).   fexofenadine 180 MG tablet Commonly known as:  ALLEGRA Take 1 tablet (180 mg total) by  mouth daily. What changed:  when to take this   folic acid 1 MG tablet Commonly known as:  FOLVITE Take 1 mg by mouth daily.   gabapentin 300 MG capsule Commonly known as:  NEURONTIN Take 300 mg by mouth 2 (two) times daily.   HYDROcodone-acetaminophen 5-325 MG tablet Commonly known as:  NORCO Take 1 tablet by mouth every 6 (six) hours as needed for up to 5 days for moderate pain.   ibuprofen 800 MG tablet Commonly known as:  ADVIL,MOTRIN Take 1 tablet (800 mg total) by mouth every 8 (eight) hours as needed for mild pain or moderate pain.   LOTEMAX 0.5 % ophthalmic suspension Generic drug:  loteprednol Place 1 drop into the right eye 2 (two) times daily.   Magnesium 400 MG Caps Take 400 mg by mouth daily.   Magnesium Oxide 400 MG Caps Take 400 mg by mouth 2 (two) times daily.   mometasone 50 MCG/ACT nasal spray Commonly known as:  NASONEX Place 2 sprays into the nose daily. Two sprays each in each nostril What changed:  additional instructions   multivitamin tablet Take 1 tablet by mouth 2 (two) times daily. Bariatric   Omega-3 Krill Oil 500 MG Caps Take 1 capsule by mouth daily.   omeprazole 20 MG capsule  Commonly known as:  PRILOSEC Take 20 mg by mouth 2 (two) times daily before a meal.   Riboflavin 100 MG Caps Take 100 mg by mouth daily. VIT B2   solifenacin 10 MG tablet Commonly known as:  VESICARE Take 10 mg by mouth daily.   timolol 0.5 % ophthalmic solution Commonly known as:  TIMOPTIC Place 1 drop into the right eye 2 (two) times daily.   tiZANidine 4 MG capsule Commonly known as:  ZANAFLEX Take 4 mg by mouth 3 (three) times daily as needed for muscle spasms. Take 1 capsule (4 mg total) by mouth Three (3) times a day as needed   topiramate 100 MG tablet Commonly known as:  TOPAMAX Take 1 tablet (100 mg total) by mouth 2 (two) times daily.   vitamin C 500 MG tablet Commonly known as:  ASCORBIC ACID Take 500 mg by mouth 2 (two) times daily.    Vitamin D2 50 MCG (2000 UT) Tabs Take 2,000 Units by mouth daily.       Vitals:   04/19/18 2047 04/20/18 0401  BP: 103/70 115/79  Pulse: 76 78  Resp: 20 20  Temp: 98.2 F (36.8 C) 98.5 F (36.9 C)  SpO2: 100% 100%    Skin clean, dry and intact without evidence of skin break down, no evidence of skin tears noted. IV catheter discontinued intact. Site without signs and symptoms of complications. Dressing and pressure applied. Pt denies pain at this time. No complaints noted.  An After Visit Summary was printed and given to the patient. Patient escorted via [place], and D/C home via private auto.  Fuller Mandril, RN

## 2018-04-20 NOTE — Discharge Summary (Signed)
Physician Discharge Summary  Patient ID: Jordan Mahoney MRN: [id number] DOB/AGE: 66-29-1952 66 y.o.  Admit date: 04/17/2018 Discharge date: 04/20/2018  Admission Diagnoses: sigmoid colon cancer  Discharge Diagnoses:  Same as above  Discharged Condition: good  Hospital Course: admitted after elective low anterior resection.  Recovered well.  Tolerated diet, pain controlled.  IV iron infusion originally scheduled for tomorrow moved to day of discharge to minimize commute.  D/c with drain and staples in place and will have f/u in office.  Consults: None  Discharge Exam: Blood pressure 115/79, pulse 78, temperature 98.5 F (36.9 C), temperature source Oral, resp. rate 20, height 5\' 5"  (1.651 m), weight 68 kg, SpO2 100 %. General appearance: alert, cooperative and no distress GI: soft, no guarding, appropriate tenderness around incisions Incision/Wound: staples c/d/i.  JP with serosanguinous drainage  Disposition:  Discharge disposition: 01-Home or Self Care       Discharge Instructions    Discharge patient   Complete by:  As directed    Discharge disposition:  01-Home or Self Care   Discharge patient date:  04/20/2018     Allergies as of 04/20/2018      Reactions   Latex    Sulfa Antibiotics Other (See Comments)   Causes skin reaction ( on her mid back)  Pigmentation.    Tape    Valium [diazepam]       Medication List    STOP taking these medications   methotrexate 2.5 MG tablet Commonly known as:  RHEUMATREX     TAKE these medications   acetaminophen 325 MG tablet Commonly known as:  TYLENOL Take 2 tablets (650 mg total) by mouth every 8 (eight) hours as needed for up to 30 days for mild pain.   ALPRAZolam 1 MG tablet Commonly known as:  XANAX Take 1 mg by mouth 2 (two) times daily as needed for anxiety or sleep. Take 1 tablet (1 mg total) by mouth 2 (two) times daily as needed for Sleep or Anxiety for up to 30 days   azelastine 0.1 % nasal spray Commonly  known as:  ASTELIN Place 2 sprays into both nostrils daily. What changed:    when to take this  reasons to take this   CALCIUM CITRATE PO Take 500 mg by mouth 2 (two) times daily. Bariatric   docusate sodium 100 MG capsule Commonly known as:  COLACE Take 1 capsule (100 mg total) by mouth 2 (two) times daily as needed for up to 10 days for mild constipation.   dorzolamide 2 % ophthalmic solution Commonly known as:  TRUSOPT Place 1 drop into the right eye 2 (two) times daily.   eletriptan 40 MG tablet Commonly known as:  RELPAX Take 1 tablet (40 mg total) by mouth as needed for migraine or headache. May repeat in 2 hours if headache persists or recurs.   EPINEPHrine 0.3 mg/0.3 mL Soaj injection Commonly known as:  EPIPEN 2-PAK Inject 0.3 mLs (0.3 mg total) into the muscle as needed (for severe life-threatening allergic reaction).   fexofenadine 180 MG tablet Commonly known as:  ALLEGRA Take 1 tablet (180 mg total) by mouth daily. What changed:  when to take this   folic acid 1 MG tablet Commonly known as:  FOLVITE Take 1 mg by mouth daily.   gabapentin 300 MG capsule Commonly known as:  NEURONTIN Take 300 mg by mouth 2 (two) times daily.   HYDROcodone-acetaminophen 5-325 MG tablet Commonly known as:  NORCO Take 1 tablet by mouth  every 6 (six) hours as needed for up to 5 days for moderate pain.   ibuprofen 800 MG tablet Commonly known as:  ADVIL,MOTRIN Take 1 tablet (800 mg total) by mouth every 8 (eight) hours as needed for mild pain or moderate pain.   LOTEMAX 0.5 % ophthalmic suspension Generic drug:  loteprednol Place 1 drop into the right eye 2 (two) times daily.   Magnesium 400 MG Caps Take 400 mg by mouth daily.   Magnesium Oxide 400 MG Caps Take 400 mg by mouth 2 (two) times daily.   mometasone 50 MCG/ACT nasal spray Commonly known as:  NASONEX Place 2 sprays into the nose daily. Two sprays each in each nostril What changed:  additional instructions    multivitamin tablet Take 1 tablet by mouth 2 (two) times daily. Bariatric   Omega-3 Krill Oil 500 MG Caps Take 1 capsule by mouth daily.   omeprazole 20 MG capsule Commonly known as:  PRILOSEC Take 20 mg by mouth 2 (two) times daily before a meal.   Riboflavin 100 MG Caps Take 100 mg by mouth daily. VIT B2   solifenacin 10 MG tablet Commonly known as:  VESICARE Take 10 mg by mouth daily.   timolol 0.5 % ophthalmic solution Commonly known as:  TIMOPTIC Place 1 drop into the right eye 2 (two) times daily.   tiZANidine 4 MG capsule Commonly known as:  ZANAFLEX Take 4 mg by mouth 3 (three) times daily as needed for muscle spasms. Take 1 capsule (4 mg total) by mouth Three (3) times a day as needed   topiramate 100 MG tablet Commonly known as:  TOPAMAX Take 1 tablet (100 mg total) by mouth 2 (two) times daily.   vitamin C 500 MG tablet Commonly known as:  ASCORBIC ACID Take 500 mg by mouth 2 (two) times daily.   Vitamin D2 50 MCG (2000 UT) Tabs Take 2,000 Units by mouth daily.      Follow-up Information    Benjamine Sprague, DO Follow up on 04/26/2018.   Specialty:  Surgery Why:  @330pm  for possible staple and drain removal Contact information: [place] [address] [phone]        Earlie Server, MD Follow up in 2 week(s).   Specialty:  Oncology Why:  discuss pathology results.  Office will call you to confirm appointment Contact information: [place] [address] [phone]            Total time spent arranging discharge was >96min. Signed: Benjamine Sprague 04/20/2018, 9:14 AM

## 2018-04-20 NOTE — Care Management Note (Signed)
Case Management Note  Patient Details  Name: Jordan Mahoney MRN: [id number] Date of Birth: 28-May-1950   Patient s/p Laparoscopic hand-assisted low Anterior Resection.  Lives at home with spouse.  PCP [place].  [place].  Patient denies any issues obtaining medications.  Spouse provides transportation to all appointments.  No DME, and states that none is indicated at discharge.    Pre referral was made by surgery office to [facility] home [facility].  Patient declines any services at discharge. Cassie and Joelene Millin with [facility] notified that patient declines services.   Subjective/Objective:                    Action/Plan:   Expected Discharge Date:  04/20/18               Expected Discharge Plan:  Home/Self Care  In-House Referral:     Discharge planning Services  CM Consult  Post Acute Care Choice:    Choice offered to:     DME Arranged:    DME Agency:     HH Arranged:  Patient Refused [place] Agency:     Status of Service:  Completed, signed off  If discussed at [employer] of Stay Meetings, dates discussed:    Additional Comments:  Beverly Sessions, RN 04/20/2018, 9:56 AM

## 2018-04-21 ENCOUNTER — Inpatient Hospital Stay: Payer: Medicare Other | Attending: Oncology

## 2018-04-21 LAB — SURGICAL PATHOLOGY

## 2018-04-26 ENCOUNTER — Ambulatory Visit (INDEPENDENT_AMBULATORY_CARE_PROVIDER_SITE_OTHER): Payer: Medicare Other

## 2018-04-26 DIAGNOSIS — J309 Allergic rhinitis, unspecified: Secondary | ICD-10-CM

## 2018-04-28 ENCOUNTER — Inpatient Hospital Stay: Payer: Medicare Other | Attending: Oncology

## 2018-04-28 ENCOUNTER — Other Ambulatory Visit: Payer: Self-pay

## 2018-04-28 VITALS — BP 107/73 | HR 65 | Temp 98.2°F | Resp 20

## 2018-04-28 DIAGNOSIS — D500 Iron deficiency anemia secondary to blood loss (chronic): Secondary | ICD-10-CM | POA: Insufficient documentation

## 2018-04-28 DIAGNOSIS — Z9884 Bariatric surgery status: Secondary | ICD-10-CM | POA: Insufficient documentation

## 2018-04-28 DIAGNOSIS — C187 Malignant neoplasm of sigmoid colon: Secondary | ICD-10-CM | POA: Insufficient documentation

## 2018-04-28 MED ORDER — SODIUM CHLORIDE 0.9 % IV SOLN
Freq: Once | INTRAVENOUS | Status: AC
  Administered 2018-04-28: 14:00:00 via INTRAVENOUS
  Filled 2018-04-28: qty 250

## 2018-04-28 MED ORDER — IRON SUCROSE 20 MG/ML IV SOLN
200.0000 mg | Freq: Once | INTRAVENOUS | Status: AC
  Administered 2018-04-28: 200 mg via INTRAVENOUS
  Filled 2018-04-28: qty 10

## 2018-05-01 ENCOUNTER — Telehealth: Payer: Self-pay | Admitting: *Deleted

## 2018-05-01 ENCOUNTER — Inpatient Hospital Stay: Payer: Medicare Other | Admitting: Oncology

## 2018-05-01 MED ORDER — MOMETASONE FUROATE 50 MCG/ACT NA SUSP: 2.0000 | Freq: Every day | NASAL | 0 refills | Status: DC

## 2018-05-01 MED ORDER — AZELASTINE HCL 0.1 % NA SOLN: 2.0000 | Freq: Every day | NASAL | 0 refills | Status: DC

## 2018-05-01 NOTE — Telephone Encounter (Signed)
Refills on both nasal sprays sent in

## 2018-05-02 ENCOUNTER — Ambulatory Visit: Admitting: Allergy & Immunology

## 2018-05-03 ENCOUNTER — Ambulatory Visit: Payer: Medicare Other | Admitting: Allergy & Immunology

## 2018-05-04 ENCOUNTER — Inpatient Hospital Stay (HOSPITAL_BASED_OUTPATIENT_CLINIC_OR_DEPARTMENT_OTHER): Payer: Medicare Other | Admitting: Oncology

## 2018-05-04 ENCOUNTER — Other Ambulatory Visit: Payer: Self-pay

## 2018-05-04 ENCOUNTER — Encounter: Payer: Self-pay | Admitting: Oncology

## 2018-05-04 VITALS — BP 113/79 | HR 71 | Temp 97.4°F | Resp 18 | Wt 142.3 lb

## 2018-05-04 DIAGNOSIS — Z9884 Bariatric surgery status: Secondary | ICD-10-CM

## 2018-05-04 DIAGNOSIS — R634 Abnormal weight loss: Secondary | ICD-10-CM

## 2018-05-04 DIAGNOSIS — D500 Iron deficiency anemia secondary to blood loss (chronic): Secondary | ICD-10-CM

## 2018-05-04 DIAGNOSIS — C187 Malignant neoplasm of sigmoid colon: Secondary | ICD-10-CM | POA: Diagnosis not present

## 2018-05-04 NOTE — Progress Notes (Signed)
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  female with PMH listed below who was referred to me for evaluation of rectosigmoid cancer. Patient was initially seen care physician for evaluation of fatigue.  Lab work-up showed that patient's anemic.  Hemoccult was obtained and was positive.  Patient was referred to [facility] and was seen and evaluated by Dr. Alice Reichert.  Reports that she had 2-3 bowel movement daily sometimes mushy and sometimes formed.  Denies seeing any bright red blood in the stool.  Denies any associated abdominal pain, gastric discomfort.  Appetite seems to be stable.  History of gastric bypass in 2010.  Cholecystectomy in 2000.  ?Unintentional weight loss 5 to 6 pounds for the past 2 months.   prior records showed she weighs 149 pounds on 09/28/2017, weighed 160 pounds 05/03/2017 Today she weighs 153 pounds  Colonoscopy was obtained on 03/27/2018.  6 mm polyp found in sigmoid colon.  Polyp is sessile. An ulcerated partially obstructing mass was found in the rectosigmoid colon.  Mass was circumferential.  Measured 5 cm in length.  No bleeding was present.  Biopsy was taken.  Nonbleeding internal hemorrhoids were found during retroflexion.   INTERVAL HISTORY Jordan Mahoney is a 68 y.o. female who has above history reviewed by me today presents for follow up visit for management of colorectal cancer. Problems and complaints are listed below: During the interval, patient underwent surgery.  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 No loss of nuclear expression of MMR proteins: Low probability of MSI-H.   Patient reports feeling well today. She is recovering from surgery. Had about 10 pounds loss since surgery.  She has no concerns about her surgical wound today.   She has also received IV Venofer at cancer center and one dose during her hospitalization for surgery. Reports fatigue has improved.   Review of Systems  Constitutional: Negative for appetite change, chills, fatigue and fever.       Weight loss since surgery  HENT:   Negative for hearing loss and voice change.   Eyes: Negative for eye problems.  Respiratory: Negative for chest tightness and cough.   Cardiovascular: Negative for chest pain.  Gastrointestinal: Negative for abdominal distention, abdominal pain and blood in stool.  Endocrine: Negative for hot flashes.  Genitourinary: Negative for difficulty urinating and frequency.   Musculoskeletal: Negative for arthralgias.  Skin: Negative for itching and rash.  Neurological: Negative for extremity weakness.  Hematological: Negative for adenopathy.  Psychiatric/Behavioral: Negative for confusion. The patient is not nervous/anxious.     MEDICAL HISTORY:  Past Medical History:  Diagnosis Date   Allergic rhinoconjunctivitis    HA (headache)    Hearing loss    wearing bilateral aides    SURGICAL HISTORY: Past Surgical History:  Procedure Laterality Date   belly button     c sections     CATARACT EXTRACTION Right    COLON RESECTION N/A 04/17/2018   Procedure: LAPAROSCOPIC COLON RESECTION POSSIBLE OSTOMY;  Surgeon: Benjamine Sprague, DO;  Location: [facility] ORS;  Service: General;  Laterality: N/A;  ELBOW SURGERY Left    GANGLION CYST EXCISION Left 01/22/2016   base of thumb   neck fusion  07/04/2017   Dr. Carloyn Manner in [place] Right     SOCIAL HISTORY: Social History   Socioeconomic  History   Marital status: Married    Spouse name: Not on file   Number of children: 2   Years of education: college   Highest education level: Not on file  Occupational History    Comment: retired  Scientist, product/process development strain: Not on file   Food insecurity:    Worry: Not on file    Inability: Not on file   Transportation needs:    Medical: Not on file    Non-medical: Not on file  Tobacco Use   Smoking status: Former Smoker    Last attempt to quit: 1983    Years since quitting: 37.2   Smokeless tobacco: Never Used   Tobacco comment: Quit in 1985  Substance and Sexual Activity   Alcohol use: Yes    Alcohol/week: 1.0 standard drinks    Types: 1 Cans of beer per week    Comment: Rare    Drug use: No   Sexual activity: Not on file  Lifestyle   Physical activity:    Days per week: Not on file    Minutes per session: Not on file   Stress: Not on file  Relationships   Social connections:    Talks on phone: Not on file    Gets together: Not on file    Attends religious service: Not on file    Active member of club or organization: Not on file    Attends meetings of clubs or organizations: Not on file    Relationship status: Not on file   Intimate partner violence:    Fear of current or ex partner: Not on file    Emotionally abused: Not on file    Physically abused: Not on file    Forced sexual activity: Not on file  Other Topics Concern   Not on file  Social History Narrative   Patient lives at home with her husband Jordan Mahoney).    Education two years of college.   Caffeine two glasses of tea.    FAMILY HISTORY: Family History  Problem Relation Age of Onset   Diabetes Father    Stroke Father    Migraines Daughter    Breast cancer Paternal Aunt    Allergic rhinitis Neg Hx    Angioedema Neg Hx    Asthma Neg Hx    Eczema Neg Hx    Immunodeficiency Neg Hx    Urticaria Neg Hx     ALLERGIES:  is allergic to latex; sulfa  antibiotics; tape; and valium [diazepam].  MEDICATIONS:  Current Outpatient Medications  Medication Sig Dispense Refill   acetaminophen (TYLENOL) 325 MG tablet Take 2 tablets (650 mg total) by mouth every 8 (eight) hours as needed for up to 30 days for mild pain. 40 tablet 0   azelastine (ASTELIN) 0.1 % nasal spray Place 2 sprays into both nostrils daily. 90 mL 0   CALCIUM CITRATE PO Take 500 mg by mouth 2 (two) times daily. Bariatric     dorzolamide (TRUSOPT) 2 % ophthalmic solution Place 1 drop into the right eye 2 (two) times daily.      eletriptan (RELPAX) 40 MG tablet Take 1 tablet (40 mg total) by mouth as needed for migraine or headache. May  repeat in 2 hours if headache persists or recurs. 27 tablet 3   EPINEPHrine (EPIPEN 2-PAK) 0.3 mg/0.3 mL IJ SOAJ injection Inject 0.3 mLs (0.3 mg total) into the muscle as needed (for severe life-threatening allergic reaction). 2 Device 1   Ergocalciferol (VITAMIN D2) 2000 UNITS TABS Take 2,000 Units by mouth daily.      fexofenadine (ALLEGRA) 180 MG tablet Take 1 tablet (180 mg total) by mouth daily. (Patient taking differently: Take 180 mg by mouth 2 (two) times daily. ) 90 tablet 1   folic acid (FOLVITE) 1 MG tablet Take 1 mg by mouth daily.      gabapentin (NEURONTIN) 300 MG capsule Take 300 mg by mouth 2 (two) times daily.     ibuprofen (ADVIL,MOTRIN) 800 MG tablet Take 1 tablet (800 mg total) by mouth every 8 (eight) hours as needed for mild pain or moderate pain. 30 tablet 0   LOTEMAX 0.5 % ophthalmic suspension Place 1 drop into the right eye 2 (two) times daily.      Magnesium Oxide 400 MG CAPS Take 400 mg by mouth 2 (two) times daily.      metroNIDAZOLE (FLAGYL) 500 MG tablet      mometasone (NASONEX) 50 MCG/ACT nasal spray Place 2 sprays into the nose daily. Two sprays each in each nostril 51 g 0   Multiple Vitamin (MULTIVITAMIN) tablet Take 1 tablet by mouth 2 (two) times daily. Bariatric     Omega-3 Krill Oil 500 MG CAPS  Take 1 capsule by mouth daily.      omeprazole (PRILOSEC) 20 MG capsule Take 20 mg by mouth 2 (two) times daily before a meal.     Riboflavin 100 MG CAPS Take 100 mg by mouth daily. VIT B2     solifenacin (VESICARE) 10 MG tablet Take 10 mg by mouth daily.     timolol (TIMOPTIC) 0.5 % ophthalmic solution Place 1 drop into the right eye 2 (two) times daily.      tiZANidine (ZANAFLEX) 4 MG capsule Take 4 mg by mouth 3 (three) times daily as needed for muscle spasms. Take 1 capsule (4 mg total) by mouth Three (3) times a day as needed     topiramate (TOPAMAX) 100 MG tablet Take 1 tablet (100 mg total) by mouth 2 (two) times daily. 180 tablet 3   vitamin C (ASCORBIC ACID) 500 MG tablet Take 500 mg by mouth 2 (two) times daily.     Magnesium 400 MG CAPS Take 400 mg by mouth daily.     No current facility-administered medications for this visit.      PHYSICAL EXAMINATION: ECOG PERFORMANCE STATUS: 1 - Symptomatic but completely ambulatory Vitals:   05/04/18 1105  BP: 113/79  Pulse: 71  Resp: 18  Temp: (!) 97.4 F (36.3 C)   Filed Weights   05/04/18 1105  Weight: 142 lb 4.8 oz (64.5 kg)    Physical Exam Constitutional:      General: She is not in acute distress. HENT:     Head: Normocephalic and atraumatic.  Eyes:     General: No scleral icterus.    Pupils: Pupils are equal, round, and reactive to light.  Neck:     Musculoskeletal: Normal range of motion and neck supple.  Cardiovascular:     Rate and Rhythm: Normal rate and regular rhythm.     Heart sounds: Normal heart sounds.  Pulmonary:     Effort: Pulmonary effort is normal. No respiratory distress.  Breath sounds: No wheezing.  Abdominal:     General: Bowel sounds are normal. There is no distension.     Palpations: Abdomen is soft. There is no mass.     Tenderness: There is no abdominal tenderness.  Musculoskeletal: Normal range of motion.        General: No deformity.  Skin:    General: Skin is warm and  dry.     Findings: No erythema or rash.  Neurological:     Mental Status: She is alert and oriented to person, place, and time.     Cranial Nerves: No cranial nerve deficit.     Coordination: Coordination normal.  Psychiatric:        Behavior: Behavior normal.        Thought Content: Thought content normal.     RADIOGRAPHIC STUDIES: I have personally reviewed the radiological images as listed and agreed with the findings in the report. 04/03/2018 CT chest abdomen pelvis with contrast Rectosigmoid thickening.  No findings of hepatic metastatic disease or retroperitoneal lymph pulmonary findings or evidence of pulmonary metastatic disease.  A few scattered pulmonary nodules are likely benign.  Recommend attention on future scans.  Surgical changes from gastric bypass surgery but no complicating features were identified.  15 mm right thyroid lobe lesion.  Recommend correlation with thyroid ultrasound examination.   LABORATORY DATA:  I have reviewed the data as listed Lab Results  Component Value Date   WBC 8.9 04/19/2018   HGB 8.6 (L) 04/19/2018   HCT 27.6 (L) 04/19/2018   MCV 84.4 04/19/2018   PLT 227 04/19/2018   Recent Labs    04/17/18 0649 04/18/18 0416 04/19/18 0256  NA 143 140 144  K 3.5 3.6 3.4*  CL  --  114* 118*  CO2  --  20* 21*  GLUCOSE 121* 109* 99  BUN  --  12 7*  CREATININE  --  0.73 0.60  CALCIUM  --  7.8* 8.0*  GFRNONAA  --  >60 >60  GFRAA  --  >60 >60   Iron/TIBC/Ferritin/ %Sat    Component Value Date/Time   IRON 21 (L) 04/04/2018 1131   TIBC 463 (H) 04/04/2018 1131   FERRITIN 7 (L) 04/04/2018 1131   IRONPCTSAT 5 (L) 04/04/2018 1131     Preop CEA was obtained on 04/03/2018, at 1.8 04/03/2018, CBC showed hemoglobin 10.8, MCV 84.1, white count 7.8, platelet count 316  Colonoscopy pathology 03/27/2018, done at [facility], results scanned in [place]. 1) specimen 1 polypectomy excision : c sigmoid olonic mucosa with focal glandular epithelium and benign  lymphoid aggregate.  No adenomatous glands identified.  Negative for dysplasia or malignancy. 2) specimen 2: Biopsy, rectosigmoid: Colonic tissue with invasive adenocarcinoma, moderately differentiated.  04/17/2018 Pathology DIAGNOSIS:  A. SIGMOID COLON; SEGMENTAL RESECTION:  - NEGATIVE FOR DYSPLASIA AND MALIGNANCY.  - CARBON BLACK TATTOO PIGMENT PRESENT.  - FIVE LYMPH NODES NEGATIVE FOR MALIGNANCY (0/5).   B. DISTAL SIGMOID AND RECTOSIGMOID COLON; LOW ANTERIOR RESECTION:  - ADENOCARCINOMA INVADING THROUGH THE MUSCULARIS PROPRIA, SEE SUMMARY  BELOW.  - 13 LYMPH NODES NEGATIVE FOR MALIGNANCY (0/13).   C. COLON ANASTOMOSIS MARGINS:  - NEGATIVE FOR MALIGNANCY.   CANCER CASE SUMMARY: COLON AND RECTUM  Procedure: Low anterior resection  Tumor Site: Sigmoid colon  Tumor Size: Greatest dimension: 5.3 cm  Macroscopic Tumor Perforation: Not identified  Histologic Type: Adenocarcinoma  Histologic Grade: G2: Moderately differentiated  Tumor Extension: Tumor invades through the muscularis propria into  peri-colorectal tissue  Margins:  All margins are uninvolved by invasive carcinoma, high-grade  dysplasia, intramucosal adenocarcinoma, and adenoma  Margins examined: Proximal, distal, and mesenteric/radial  Treatment Effect: No known presurgical therapy  Lymphovascular Invasion: Not identified  Perineural Invasion: Not identified  Tumor Deposits: Not identified  Regional Lymph Nodes:    Number of Lymph Nodes Involved: 0    Number of Lymph Nodes Examined: 18   Pathologic Stage Classification (pTNM, AJCC 8th Edition): pT3 pN0   Immunohistochemistry (IHC) Testing for DNA Mismatch Repair (MMR) Proteins:  Results:  MLH1: Intact nuclear expression  MSH2: Intact nuclear expression  MSH6: Intact nuclear expression  PMS2: Intact nuclear expression  IHC Interpretation: No loss of nuclear expression of MMR proteins: Low probability of MSI-H.    ASSESSMENT & PLAN:  1. Adenocarcinoma of  sigmoid colon ([place])   2. Iron deficiency anemia due to chronic blood loss   3. Weight loss   Cancer Staging Adenocarcinoma of sigmoid colon ([place]) Staging form: Colon and Rectum, AJCC 8th Edition - Clinical: No stage assigned - Unsigned - Pathologic stage from 05/04/2018: Stage IIA (pT3, pN0, cM0) - Signed by Earlie Server, MD on 05/04/2018  Pathology was reviewed and discussed with patient.  Patient has stage IIA sigmoid colon adenocarcinoma, with no LVI, and negative margin.  The diagnosis and care plan were discussed with patient in detail.  NCCN guidelines were reviewed and shared with patient.  No adjuvant chemotherapy will be offered.  Surveillance plan.  Recommend history and physical examination every 3-6 months for 2-3 years.  CEA every 3 months. -pre operation CEA normal, 1.8 CT chest abdomen pelvis w contrast Q6 months.  She will need repeat colonoscopy one year after surgery.   # Iron deficiency anemia, s/p IV venofer treatments x 4.  She did not have labs done today.  Clinically doing well. Recommend repeat CBC, iron TIBC, ferritin, CMP in 3 months.   # Discussed with patient that weight loss can be secondary to recent surgery. Continue monitor. Check TSH  Orders Placed This Encounter  Procedures   CEA    Standing Status:   Future    Standing Expiration Date:   05/04/2019   CBC with Differential/Platelet    Standing Status:   Future    Standing Expiration Date:   05/04/2019   Comprehensive metabolic panel    Standing Status:   Future    Standing Expiration Date:   05/04/2019    We spent sufficient time to discuss many aspect of care, questions were answered to patient's satisfaction. Total face to face encounter time for this patient visit was 25 min. >50% of the time was  spent in counseling and coordination of care.   Earlie Server, MD, PhD Hematology Oncology [facility] Pager- [phone] 05/04/2018

## 2018-05-04 NOTE — Progress Notes (Signed)
Patient here for follow up. No concerns voiced.  °

## 2018-05-09 ENCOUNTER — Other Ambulatory Visit: Payer: Self-pay | Admitting: Nurse Practitioner

## 2018-05-09 MED ORDER — ELETRIPTAN HYDROBROMIDE 40 MG PO TABS: 40.0000 mg | ORAL_TABLET | ORAL | 0 refills | Status: DC | PRN

## 2018-05-09 NOTE — Telephone Encounter (Signed)
Pt states she is down to about 6 pills, she is asking for a refill on her eletriptan (RELPAX) 40 MG tablet [facility] .  Pt states it generally takes 2-3 weeks to [facility].  Pt is asking if an amount can be called into- [facility] while she waits on mail order

## 2018-05-09 NOTE — Addendum Note (Signed)
Addended by: Thomes Cake on: 05/09/2018 11:53 AM   Modules accepted: Orders

## 2018-05-17 ENCOUNTER — Ambulatory Visit (INDEPENDENT_AMBULATORY_CARE_PROVIDER_SITE_OTHER): Payer: Medicare Other

## 2018-05-17 DIAGNOSIS — J309 Allergic rhinitis, unspecified: Secondary | ICD-10-CM | POA: Diagnosis not present

## 2018-06-07 ENCOUNTER — Ambulatory Visit (INDEPENDENT_AMBULATORY_CARE_PROVIDER_SITE_OTHER): Payer: Medicare Other

## 2018-06-07 DIAGNOSIS — J309 Allergic rhinitis, unspecified: Secondary | ICD-10-CM | POA: Diagnosis not present

## 2018-06-14 ENCOUNTER — Ambulatory Visit (INDEPENDENT_AMBULATORY_CARE_PROVIDER_SITE_OTHER): Payer: Medicare Other | Admitting: Allergy & Immunology

## 2018-06-14 ENCOUNTER — Ambulatory Visit: Payer: Self-pay

## 2018-06-14 ENCOUNTER — Other Ambulatory Visit: Payer: Self-pay

## 2018-06-14 ENCOUNTER — Encounter: Payer: Self-pay | Admitting: Allergy & Immunology

## 2018-06-14 VITALS — BP 110/80 | HR 56 | Resp 16

## 2018-06-14 DIAGNOSIS — K219 Gastro-esophageal reflux disease without esophagitis: Secondary | ICD-10-CM

## 2018-06-14 DIAGNOSIS — J302 Other seasonal allergic rhinitis: Secondary | ICD-10-CM

## 2018-06-14 DIAGNOSIS — J3089 Other allergic rhinitis: Secondary | ICD-10-CM

## 2018-06-14 DIAGNOSIS — J309 Allergic rhinitis, unspecified: Secondary | ICD-10-CM

## 2018-06-14 MED ORDER — MOMETASONE FUROATE 50 MCG/ACT NA SUSP: 2.0000 | Freq: Every day | NASAL | 1 refills | Status: DC

## 2018-06-14 MED ORDER — AZELASTINE HCL 0.1 % NA SOLN: 2.0000 | Freq: Every day | NASAL | 0 refills | Status: DC

## 2018-06-14 MED ORDER — EPINEPHRINE 0.3 MG/0.3ML IJ SOAJ: 0.3000 mg | INTRAMUSCULAR | 1 refills | Status: AC | PRN

## 2018-06-14 NOTE — Patient Instructions (Addendum)
1. Perennial and seasonal allergic rhinitis (molds, cat, dog, dust mite) - Continue with allergy shots at the same schedule, with anticipated completion date of September 2021 (five years total). - Continue with Astelin two sprays per nostril once daily. - Continue with Allegra 180mg  twice daily.  - Continue with Nasonex 1-2 sprays per nostril daily.  2. GERD - Continue with Prilosec.  4. Return in about 1 year (around 06/14/2019).   Please inform us of any Emergency Department visits, hospitalizations, or changes in symptoms. Call us before going to the ED for breathing or allergy symptoms since we might be able to fit you in for a sick visit. Feel free to contact us anytime with any questions, problems, or concerns.  It was a pleasure to see you again today!  Websites that have reliable patient information: 1. American Academy of Asthma, Allergy, and Immunology: www.aaaai.org 2. Food Allergy Research and Education (FARE): foodallergy.org 3. Mothers of Asthmatics: http://www.asthmacommunitynetwork.org 4. American College of Allergy, Asthma, and Immunology: www.acaai.org

## 2018-06-14 NOTE — Progress Notes (Signed)
FOLLOW UP  Date of Service/Encounter:  06/14/18   Assessment:   Seasonal and perennial allergic rhinitis (molds, dust mites, cat and dog) - on allergen immunotherapy with maintenance reached September 2016  Gastroesophageal reflux disease  Recent diagnosis of colon cancer - s/p partial resection   Plan/Recommendations:   1. Perennial and seasonal allergic rhinitis (molds, cat, dog, dust mite) - Continue with allergy shots at the same schedule, with anticipated completion date of September 2021 (five years total). - Continue with Astelin two sprays per nostril once daily. - Continue with Allegra 180mg  twice daily.  - Continue with Nasonex 1-2 sprays per nostril daily. - Discussed the normal anticipated outcomes from allergen immunotherapy, including the duration of treatment.   2. GERD - Continue with Prilosec. - Refills provided.   3. Return in about 1 year (around 06/14/2019).   Subjective:   Jordan Mahoney is a 68 y.o. female presenting today for follow up of  Chief Complaint  Patient presents with  . Allergic Rhinitis   . Gastroesophageal Reflux    Jordan Mahoney has a history of the following: Patient Active Problem List   Diagnosis Date Noted  . Adenocarcinoma of sigmoid colon ([place]) 04/17/2018  . Iron deficiency anemia due to chronic blood loss 04/04/2018  . Gastroesophageal reflux disease 05/03/2017  . Lactose intolerance 05/03/2017  . Allergic rhinitis 11/07/2014  . Headache, migraine 03/27/2014  . Migraine 12/11/2013  . Numbness 12/11/2013  . Urinary incontinence 12/11/2013  . HA (headache)     History obtained from: chart review and patient.  Jordan Mahoney is a 68 y.o. female presenting for a follow up visit.  She has a history of perennial and seasonal allergic rhinitis.  At the last visit, she shots at the same schedule in combination with Nasonex, Astelin, and Allegra.  We also continued her Nexium for her reflux.  Since the last visit, she has  mostly done well. 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 Carloyn Manner in [place]. She had this surgery performed in April 2019. Then she had colon surgery and had 10 inches removed for stage 2 colon cancer. She did not need any chemotherapy. She actually became rather sick and was very fatigued. She did a blood testing and she had low hemoglobin, which lead to the diagnosis.   Jordan Mahoney is on allergen immunotherapy. She receives one injection. Immunotherapy script #1 contains molds, dust mites, cat and dog. She currently receives 0.66mL of the RED vial (1/100). She started shots in early 2016 and reached maintenance in September of 2016. She is doing well on her shots without any adverse events.   Otherwise, there have been no changes to her past medical history, surgical history, family history, or social history. She has been staying inside during the COVID-19 pandemic.     Review of Systems  Constitutional: Negative.  Negative for fever, malaise/fatigue and weight loss.  HENT: Negative.  Negative for congestion, ear discharge, ear pain and nosebleeds.   Eyes: Negative for pain, discharge and redness.  Respiratory: Negative for cough, sputum production, shortness of breath and wheezing.   Cardiovascular: Negative.  Negative for chest pain and palpitations.  Gastrointestinal: Negative for abdominal pain, heartburn, nausea and vomiting.  Skin: Negative.  Negative for itching and rash.  Neurological: Negative for dizziness and headaches.  Endo/Heme/Allergies: Negative for environmental allergies. Does not bruise/bleed easily.       Objective:   Blood pressure 110/80, pulse (!) 56, resp. rate 16, SpO2 95 %. There is  no height or weight on file to calculate BMI.   Physical Exam:  Physical Exam  Constitutional: She appears well-developed.  HENT:  Head: Normocephalic and atraumatic.  Right Ear: Tympanic membrane, external ear and ear canal normal.  Left Ear: Tympanic membrane and ear canal  normal.  Nose: No mucosal edema, rhinorrhea, nasal deformity or septal deviation. No epistaxis. Right sinus exhibits no maxillary sinus tenderness and no frontal sinus tenderness. Left sinus exhibits no maxillary sinus tenderness and no frontal sinus tenderness.  Mouth/Throat: Uvula is midline and oropharynx is clear and moist. Mucous membranes are not pale and not dry.  Mild cobblestoning in the posterior oropharynx.   Eyes: Pupils are equal, round, and reactive to light. Conjunctivae and EOM are normal. Right eye exhibits no chemosis and no discharge. Left eye exhibits no chemosis and no discharge. Right conjunctiva is not injected. Left conjunctiva is not injected.  Cardiovascular: Normal rate, regular rhythm and normal heart sounds.  Respiratory: Effort normal and breath sounds normal. No accessory muscle usage. No tachypnea. No respiratory distress. She has no wheezes. She has no rhonchi. She has no rales. She exhibits no tenderness.  Moving air well in all lung fields.  Lymphadenopathy:    She has no cervical adenopathy.  Neurological: She is alert.  Skin: No abrasion, no petechiae and no rash noted. Rash is not papular, not vesicular and not urticarial. No erythema. No pallor.  Psychiatric: She has a normal mood and affect.     Diagnostic studies: none      Salvatore Marvel, MD  [facility]

## 2018-06-16 ENCOUNTER — Other Ambulatory Visit: Payer: Self-pay

## 2018-06-16 MED ORDER — AZELASTINE HCL 0.1 % NA SOLN: 2.0000 | Freq: Every day | NASAL | 3 refills | Status: DC

## 2018-06-19 ENCOUNTER — Telehealth: Payer: Self-pay

## 2018-06-19 ENCOUNTER — Telehealth: Payer: Self-pay | Admitting: Adult Health

## 2018-06-19 NOTE — Telephone Encounter (Addendum)
Spoke to pt and relayed that will send email for appt 06-22-18 at 1500.  She confirmed email.  Will look for it.  Due to current COVID 19 pandemic, our office is severely reducing in office visits for at least the next 2 weeks, in order to minimize the risk to our patients and healthcare providers.  Pt understands that although there may be some limitations with this type of visit, we will take all precautions to reduce any security or privacy concerns.  Pt understands that this will be treated like an in office visit and we will file with pt's insurance.  We will be using [email or web address] for the virtual video visit. No download needed. Will send email with instructions.  She verbalized understanding. Headaches much worse due to barometric pressure.

## 2018-06-19 NOTE — Telephone Encounter (Signed)
Pt states that her headaches are more frequent and there medication is not lasting her the whole month. She states that she can not wait till August to be seen. Pt consented to a Virtual Visit and for the insurance to be billed as such. She would like information to be emailed to her husband's email  [email or web address].

## 2018-06-19 NOTE — Telephone Encounter (Signed)
Spoke with the patient and she mentioned that she had spoken with a nurse who is suppose to send her a link for [email or web address]

## 2018-06-21 ENCOUNTER — Telehealth: Payer: Self-pay

## 2018-06-21 ENCOUNTER — Ambulatory Visit (INDEPENDENT_AMBULATORY_CARE_PROVIDER_SITE_OTHER): Payer: Medicare Other | Admitting: *Deleted

## 2018-06-21 DIAGNOSIS — J309 Allergic rhinitis, unspecified: Secondary | ICD-10-CM

## 2018-06-21 MED ORDER — AZELASTINE HCL 0.1 % NA SOLN: 2.0000 | Freq: Every day | NASAL | 3 refills | Status: DC

## 2018-06-21 NOTE — Telephone Encounter (Signed)
Error

## 2018-06-22 ENCOUNTER — Encounter: Payer: Self-pay | Admitting: Adult Health

## 2018-06-22 ENCOUNTER — Ambulatory Visit (INDEPENDENT_AMBULATORY_CARE_PROVIDER_SITE_OTHER): Payer: Medicare Other | Admitting: Adult Health

## 2018-06-22 ENCOUNTER — Other Ambulatory Visit: Payer: Self-pay

## 2018-06-22 DIAGNOSIS — G43019 Migraine without aura, intractable, without status migrainosus: Secondary | ICD-10-CM

## 2018-06-22 MED ORDER — ELETRIPTAN HYDROBROMIDE 40 MG PO TABS: ORAL_TABLET | ORAL | 12 refills | Status: DC

## 2018-06-22 MED ORDER — GABAPENTIN 400 MG PO CAPS: 400.0000 mg | ORAL_CAPSULE | Freq: Two times a day (BID) | ORAL | 3 refills | Status: DC

## 2018-06-22 NOTE — Progress Notes (Signed)
PATIENT: Jordan Mahoney DOB: 11-26-1950  REASON FOR VISIT: follow up HISTORY FROM: patient  Virtual Visit via Video Note  I connected with [name] on 06/22/18 at  3:00 PM EDT by a video enabled telemedicine application located remotely at [facility] and verified that I am speaking with the correct person using two identifiers who was located at their own home.   I discussed the limitations of evaluation and management by telemedicine and the availability of in person appointments. The patient expressed understanding and agreed to proceed.   PATIENT: Jordan Mahoney DOB: [birth year]  REASON FOR VISIT: follow up HISTORY FROM: patient  HISTORY OF PRESENT ILLNESS: Today 06/22/18:  Jordan Mahoney is a 68 year old female with a history of migraine headaches.  She returns today for a virtual visit.  She states that her headaches have gotten slightly worse.  She states that she has a daily headache.  They always occur in the sinus area.  She states that barometric pressure is a trigger for her.  With her headache she sometimes has nausea but no vomiting.  She does report photophobia and phonophobia.  She states that she has been taking Relpax daily.  She states that it helped initially but then the headache returned.  She has had 2 surgeries in the last year.  1 neck fusion and colon cancer surgery.  She is currently on gabapentin for numbness and tingling in arms and hands after neck fusion.  She joins me today for virtual visit.  HISTORY Jordan Mahoney, 68 year old female returns for follow-up with long history of migraine headaches which are currently well controlled on Topamax 100 twice daily with Relpax acutely.  She also takes riboflavin and magnesium.  Her biggest migraine triggers are weather changes she gets no regular exercise.  She had cervical fusion May 20 by Dr. Glenna Fellows.  She continues to take Flexeril.  She returns for reevaluation  REVIEW OF SYSTEMS: Out of  a complete 14 system review of symptoms, the patient complains only of the following symptoms, and all other reviewed systems are negative.  See HPI  ALLERGIES: Allergies  Allergen Reactions  . Latex   . Sulfa Antibiotics Other (See Comments)    Causes skin reaction ( on her mid back)  Pigmentation.   . Tape   . Valium [Diazepam]     HOME MEDICATIONS: Outpatient Medications Prior to Visit  Medication Sig Dispense Refill  . azelastine (ASTELIN) 0.1 % nasal spray Place 2 sprays into both nostrils daily. 90 mL 3  . CALCIUM CITRATE PO Take 500 mg by mouth 2 (two) times daily. Bariatric    . dorzolamide (TRUSOPT) 2 % ophthalmic solution Place 1 drop into the right eye 2 (two) times daily.     [name] eletriptan (RELPAX) 40 MG tablet Take 1 tablet (40 mg total) by mouth as needed for migraine or headache. May repeat in 2 hours if headache persists or recurs. 9 tablet 0  . EPINEPHrine (EPIPEN 2-PAK) 0.3 mg/0.3 mL IJ SOAJ injection Inject 0.3 mLs (0.3 mg total) into the muscle as needed (for severe life-threatening allergic reaction). 2 Device 1  . Ergocalciferol (VITAMIN D2) 2000 UNITS TABS Take 2,000 Units by mouth daily.     . fexofenadine (ALLEGRA) 180 MG tablet Take 1 tablet (180 mg total) by mouth daily. (Patient taking differently: Take 180 mg by mouth 2 (two) times daily. ) 90 tablet 1  . folic acid (FOLVITE) 1 MG tablet Take 1 mg by mouth  daily.     . gabapentin (NEURONTIN) 300 MG capsule Take 300 mg by mouth 2 (two) times daily.    [name] ibuprofen (ADVIL,MOTRIN) 800 MG tablet Take 1 tablet (800 mg total) by mouth every 8 (eight) hours as needed for mild pain or moderate pain. 30 tablet 0  . LOTEMAX 0.5 % ophthalmic suspension Place 1 drop into the right eye 2 (two) times daily.     . Magnesium 400 MG CAPS Take 400 mg by mouth daily.    . Magnesium Oxide 400 MG CAPS Take 400 mg by mouth 2 (two) times daily.     . methotrexate (RHEUMATREX) 2.5 MG tablet     . metroNIDAZOLE (FLAGYL) 500 MG tablet      . mometasone (NASONEX) 50 MCG/ACT nasal spray Place 2 sprays into the nose daily. Two sprays each in each nostril 51 g 1  . Multiple Vitamin (MULTIVITAMIN) tablet Take 1 tablet by mouth 2 (two) times daily. Bariatric    . Omega-3 Krill Oil 500 MG CAPS Take 1 capsule by mouth daily.     [name] omeprazole (PRILOSEC) 40 MG capsule     . Riboflavin 100 MG CAPS Take 100 mg by mouth daily. VIT B2    . solifenacin (VESICARE) 10 MG tablet Take 10 mg by mouth daily.    . timolol (TIMOPTIC) 0.5 % ophthalmic solution Place 1 drop into the right eye 2 (two) times daily.     [name] tiZANidine (ZANAFLEX) 4 MG capsule Take 4 mg by mouth 3 (three) times daily as needed for muscle spasms. Take 1 capsule (4 mg total) by mouth Three (3) times a day as needed    . topiramate (TOPAMAX) 100 MG tablet Take 1 tablet (100 mg total) by mouth 2 (two) times daily. 180 tablet 3  . vitamin C (ASCORBIC ACID) 500 MG tablet Take 500 mg by mouth 2 (two) times daily.     No facility-administered medications prior to visit.     PAST MEDICAL HISTORY: Past Medical History:  Diagnosis Date  . Allergic rhinoconjunctivitis   . HA (headache)   . Hearing loss    wearing bilateral aides    PAST SURGICAL HISTORY: Past Surgical History:  Procedure Laterality Date  . belly button    . c sections    . CATARACT EXTRACTION Right   . COLON RESECTION N/A 04/17/2018   Procedure: LAPAROSCOPIC COLON RESECTION POSSIBLE OSTOMY;  Surgeon: Benjamine Sprague, DO;  Location: [facility] ORS;  Service: General;  Laterality: N/A;  . ELBOW SURGERY Left   . GANGLION CYST EXCISION Left 01/22/2016   base of thumb  . neck fusion  07/04/2017   Dr. Carloyn Manner in [place]  . WRIST SURGERY Right     FAMILY HISTORY: Family History  Problem Relation Age of Onset  . Diabetes Father   . Stroke Father   . Migraines Daughter   . Breast cancer Paternal Aunt   . Allergic rhinitis Neg Hx   . Angioedema Neg Hx   . Asthma Neg Hx   . Eczema Neg Hx   . Immunodeficiency Neg Hx    . Urticaria Neg Hx     SOCIAL HISTORY: Social History   Socioeconomic History  . Marital status: Married    Spouse name: Not on file  . Number of children: 2  . Years of education: college  . Highest education level: Not on file  Occupational History    Comment: retired  Scientific laboratory technician  . Financial resource strain: Not  on file  . Food insecurity:    Worry: Not on file    Inability: Not on file  . Transportation needs:    Medical: Not on file    Non-medical: Not on file  Tobacco Use  . Smoking status: Former Smoker    Last attempt to quit: 1983    Years since quitting: 37.3  . Smokeless tobacco: Never Used  . Tobacco comment: Quit in 1985  Substance and Sexual Activity  . Alcohol use: Yes    Alcohol/week: 1.0 standard drinks    Types: 1 Cans of beer per week    Comment: Rare   . Drug use: No  . Sexual activity: Not on file  Lifestyle  . Physical activity:    Days per week: Not on file    Minutes per session: Not on file  . Stress: Not on file  Relationships  . Social connections:    Talks on phone: Not on file    Gets together: Not on file    Attends religious service: Not on file    Active member of club or organization: Not on file    Attends meetings of clubs or organizations: Not on file    Relationship status: Not on file  . Intimate partner violence:    Fear of current or ex partner: Not on file    Emotionally abused: Not on file    Physically abused: Not on file    Forced sexual activity: Not on file  Other Topics Concern  . Not on file  Social History Narrative   Patient lives at home with her husband [name]).    Education two years of college.   Caffeine two glasses of tea.      PHYSICAL EXAM  Generalized: Well developed, in no acute distress   Neurological examination  Mentation: Alert oriented to time, place, history taking. Follows all commands speech and language fluent Cranial nerve II-XII: Extraocular movements were full.  Facial  symmetry noted.  Uvula tongue midline. Head turning and shoulder shrug  were normal and symmetric. Motor: Good strength throughout subjectively per patient Sensory: Sensory testing is intact to soft touch on all 4 extremities subjectively per patient Coordination: Cerebellar testing reveals good finger-nose-finger  Gait and station: Patient is able to stand without assistance Reflexes: UTA  DIAGNOSTIC DATA (LABS, IMAGING, TESTING) - I reviewed patient records, labs, notes, testing and imaging myself where available.  Lab Results  Component Value Date   WBC 8.9 04/19/2018   HGB 8.6 (L) 04/19/2018   HCT 27.6 (L) 04/19/2018   MCV 84.4 04/19/2018   PLT 227 04/19/2018      Component Value Date/Time   NA 144 04/19/2018 0256   K 3.4 (L) 04/19/2018 0256   CL 118 (H) 04/19/2018 0256   CO2 21 (L) 04/19/2018 0256   GLUCOSE 99 04/19/2018 0256   BUN 7 (L) 04/19/2018 0256   CREATININE 0.60 04/19/2018 0256   CALCIUM 8.0 (L) 04/19/2018 0256   GFRNONAA >60 04/19/2018 0256   GFRAA >60 04/19/2018 0256     ASSESSMENT AND PLAN 68 y.o. year old female  has a past medical history of Allergic rhinoconjunctivitis, HA (headache), and Hearing loss. here with:  1.  Migraine headache  The patient will continue on Topamax 100 mg twice a day.  I will increase gabapentin to 400 mg twice a day.  The patient is advised that she should take Relpax at the onset of a migraine and repeat in 2 hours if  needed.  She was advised that Relpax should not be taken daily as it can cause rebound headaches.  I encouraged her not to take Relpax more than 2 days in a row.  Also encouraged the patient to keep a headache journal.  If her symptoms worsen or she develops new symptoms she should let us know.  She will follow-up in August or sooner if needed.  I spent 15 minutes with the patient time spent reviewing the chart discussing symptoms and plan of care.   Ward Givens, MSN, NP-C 06/22/2018, 2:43 PM [facility] [address] [place], [place] [address] [phone]

## 2018-06-28 ENCOUNTER — Ambulatory Visit (INDEPENDENT_AMBULATORY_CARE_PROVIDER_SITE_OTHER): Payer: Medicare Other

## 2018-06-28 DIAGNOSIS — J309 Allergic rhinitis, unspecified: Secondary | ICD-10-CM

## 2018-07-03 NOTE — Progress Notes (Signed)
I have reviewed and agreed above plan. 

## 2018-07-05 ENCOUNTER — Ambulatory Visit (INDEPENDENT_AMBULATORY_CARE_PROVIDER_SITE_OTHER): Payer: Medicare Other

## 2018-07-05 ENCOUNTER — Telehealth: Payer: Self-pay

## 2018-07-05 DIAGNOSIS — J309 Allergic rhinitis, unspecified: Secondary | ICD-10-CM | POA: Diagnosis not present

## 2018-07-05 NOTE — Telephone Encounter (Signed)
Unable to get in contact with the patient to schedule their 6 month follow up with [facility]. I left a voicemail asking the patient to return my call. Office number was provided.  If patient calls back please schedule their follow up appt.

## 2018-07-26 ENCOUNTER — Ambulatory Visit (INDEPENDENT_AMBULATORY_CARE_PROVIDER_SITE_OTHER): Payer: Medicare Other | Admitting: *Deleted

## 2018-07-26 DIAGNOSIS — J309 Allergic rhinitis, unspecified: Secondary | ICD-10-CM

## 2018-07-28 ENCOUNTER — Other Ambulatory Visit: Payer: Medicare Other

## 2018-07-31 ENCOUNTER — Other Ambulatory Visit: Payer: Self-pay

## 2018-07-31 ENCOUNTER — Ambulatory Visit: Payer: Medicare Other | Admitting: Oncology

## 2018-07-31 ENCOUNTER — Inpatient Hospital Stay (HOSPITAL_BASED_OUTPATIENT_CLINIC_OR_DEPARTMENT_OTHER): Payer: Medicare Other | Admitting: Oncology

## 2018-07-31 ENCOUNTER — Inpatient Hospital Stay: Payer: Medicare Other | Attending: Oncology

## 2018-07-31 ENCOUNTER — Other Ambulatory Visit: Payer: Medicare Other

## 2018-07-31 ENCOUNTER — Encounter: Payer: Self-pay | Admitting: Oncology

## 2018-07-31 VITALS — BP 121/75 | HR 60 | Temp 95.2°F | Resp 18 | Wt 149.3 lb

## 2018-07-31 DIAGNOSIS — D509 Iron deficiency anemia, unspecified: Secondary | ICD-10-CM | POA: Diagnosis not present

## 2018-07-31 DIAGNOSIS — R634 Abnormal weight loss: Secondary | ICD-10-CM

## 2018-07-31 DIAGNOSIS — C187 Malignant neoplasm of sigmoid colon: Secondary | ICD-10-CM

## 2018-07-31 DIAGNOSIS — Z85038 Personal history of other malignant neoplasm of large intestine: Secondary | ICD-10-CM | POA: Insufficient documentation

## 2018-07-31 DIAGNOSIS — N3289 Other specified disorders of bladder: Secondary | ICD-10-CM

## 2018-07-31 DIAGNOSIS — D500 Iron deficiency anemia secondary to blood loss (chronic): Secondary | ICD-10-CM

## 2018-07-31 LAB — CBC WITH DIFFERENTIAL/PLATELET
Abs Immature Granulocytes: 0.01 10*3/uL (ref 0.00–0.07)
Basophils Absolute: 0.1 10*3/uL (ref 0.0–0.1)
Basophils Relative: 1 %
Eosinophils Absolute: 0.2 10*3/uL (ref 0.0–0.5)
Eosinophils Relative: 4 %
HCT: 40.2 % (ref 36.0–46.0)
Hemoglobin: 12.9 g/dL (ref 12.0–15.0)
Immature Granulocytes: 0 %
Lymphocytes Relative: 41 %
Lymphs Abs: 2.4 10*3/uL (ref 0.7–4.0)
MCH: 28.8 pg (ref 26.0–34.0)
MCHC: 32.1 g/dL (ref 30.0–36.0)
MCV: 89.7 fL (ref 80.0–100.0)
Monocytes Absolute: 0.5 10*3/uL (ref 0.1–1.0)
Monocytes Relative: 8 %
Neutro Abs: 2.8 10*3/uL (ref 1.7–7.7)
Neutrophils Relative %: 46 %
Platelets: 244 10*3/uL (ref 150–400)
RBC: 4.48 MIL/uL (ref 3.87–5.11)
RDW: 15.1 % (ref 11.5–15.5)
WBC: 6 10*3/uL (ref 4.0–10.5)
nRBC: 0 % (ref 0.0–0.2)

## 2018-07-31 LAB — COMPREHENSIVE METABOLIC PANEL
ALT: 19 U/L (ref 0–44)
AST: 23 U/L (ref 15–41)
Albumin: 4.4 g/dL (ref 3.5–5.0)
Alkaline Phosphatase: 82 U/L (ref 38–126)
Anion gap: 7 (ref 5–15)
BUN: 13 mg/dL (ref 8–23)
CO2: 23 mmol/L (ref 22–32)
Calcium: 8.9 mg/dL (ref 8.9–10.3)
Chloride: 112 mmol/L — ABNORMAL HIGH (ref 98–111)
Creatinine, Ser: 0.74 mg/dL (ref 0.44–1.00)
GFR calc Af Amer: >60 mL/min (ref >60)
GFR calc non Af Amer: >60 mL/min (ref >60)
Glucose, Bld: 106 mg/dL — ABNORMAL HIGH (ref 70–99)
Potassium: 3.6 mmol/L (ref 3.5–5.1)
Sodium: 142 mmol/L (ref 135–145)
Total Bilirubin: 0.7 mg/dL (ref 0.3–1.2)
Total Protein: 7.4 g/dL (ref 6.5–8.1)

## 2018-07-31 LAB — URINALYSIS, COMPLETE (UACMP) WITH MICROSCOPIC
Bacteria, UA: NONE SEEN
Bilirubin Urine: NEGATIVE
Glucose, UA: NEGATIVE mg/dL
Hgb urine dipstick: NEGATIVE
Ketones, ur: NEGATIVE mg/dL
Leukocytes,Ua: NEGATIVE
Nitrite: NEGATIVE
Protein, ur: NEGATIVE mg/dL
Specific Gravity, Urine: 1.002 — ABNORMAL LOW (ref 1.005–1.030)
Squamous Epithelial / HPF: NONE SEEN (ref 0–5)
pH: 8 (ref 5.0–8.0)

## 2018-07-31 LAB — IRON AND TIBC
Iron: 83 ug/dL (ref 28–170)
Saturation Ratios: 20 % (ref 10.4–31.8)
TIBC: 411 ug/dL (ref 250–450)
UIBC: 328 ug/dL

## 2018-07-31 LAB — FERRITIN: Ferritin: 16 ng/mL (ref 11–307)

## 2018-07-31 NOTE — Progress Notes (Signed)
Patient here for follow up. Patient states she is having bladder spasms, feels like she has UTI, she gave urine sample to lab.

## 2018-07-31 NOTE — Progress Notes (Signed)
Hematology/Oncology Consult note [facility] Telephone:([phone] [phone]   Patient Care Team: Sander Radon, NP as PCP - General (Family Medicine) Dorthula Rue., MD as Referring Physician (Otolaryngology) Clent Jacks, RN as Registered Nurse  REFERRING PROVIDER: Dr.Toledo / Gerarda Gunther 'CHIEF COMPLAINTS/REASON FOR VISIT:  Evaluation of colorectal cancer  HISTORY OF PRESENTING ILLNESS:  Jordan Mahoney is a  68 y.o.  female with PMH listed below who was referred to me for evaluation of stage IIA colorectal cancer. Patient was initially seen care physician for evaluation of fatigue.  Lab work-up showed that patient's anemic.  Hemoccult was obtained and was positive.  Patient was referred to [place] [facility] and was seen and evaluated by Dr. Alice Reichert.  Reports that she had 2-3 bowel movement daily sometimes mushy and sometimes formed.  Denies seeing any bright red blood in the stool.  Denies any associated abdominal pain, gastric discomfort.  Appetite seems to be stable.  History of gastric bypass in 2010.  Cholecystectomy in 2000.  ?Unintentional weight loss 5 to 6 pounds for the past 2 months.   prior records showed she weighs 149 pounds on 09/28/2017, weighed 160 pounds 05/03/2017 Today she weighs 153 pounds Colonoscopy was obtained on 03/27/2018.  6 mm polyp found in sigmoid colon.  Polyp is sessile. An ulcerated partially obstructing mass was found in the rectosigmoid colon.  Mass was circumferential.  Measured 5 cm in length.  No bleeding was present.  Biopsy was taken.  Nonbleeding internal hemorrhoids were found during retroflexion.  # underwent surgery.  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 No loss of nuclear expression of MMR proteins: Low probability of MSI-H.  Stage IIA, No adjuvant chemotherapy was not  offered INTERVAL HISTORY  [name] is a 68 y.o. female who has above history reviewed by me today presents for follow up visit for management of Stage IIA colorectal cancer. Problems and complaints are listed below: Patient reports that she had diarrhea a few days ago.  May have contaminated her urinary tract.  She feels bladder spasm.  No urinary burning, increased frequency.  No exacerbating or alleviating factors.  Request urine to be examined.  She recovered from surgery.  Today she has no concerns of her breast.  Denies any blood in the stool. Fatigue level has improved, not at her baseline yet.[name]  Previously had received IV iron treatments.  Missed 1 treatment session of IV Venofer treatment on 07/11/2018.   Review of Systems  Constitutional: Positive for fatigue. Negative for appetite change, chills and fever.  HENT:   Negative for hearing loss and voice change.   Eyes: Negative for eye problems.  Respiratory: Negative for chest tightness and cough.   Cardiovascular: Negative for chest pain.  Gastrointestinal: Negative for abdominal distention, abdominal pain and blood in stool.  Endocrine: Negative for hot flashes.  Genitourinary: Negative for difficulty urinating and frequency.        Bladder spasm  Musculoskeletal: Negative for arthralgias.  Skin: Negative for itching and rash.  Neurological: Negative for extremity weakness.  Hematological: Negative for adenopathy.  Psychiatric/Behavioral: Negative for confusion. The patient is not nervous/anxious.     MEDICAL HISTORY:  Past Medical History:  Diagnosis Date  . Allergic rhinoconjunctivitis   . HA (headache)   . Hearing loss    wearing bilateral aides    SURGICAL HISTORY: Past Surgical History:  Procedure Laterality Date  . belly  button    . c sections    . CATARACT EXTRACTION Right   . COLON RESECTION N/A 04/17/2018   Procedure: LAPAROSCOPIC COLON RESECTION POSSIBLE OSTOMY;  Surgeon: Benjamine Sprague, DO;  Location: [facility] ORS;  Service:  General;  Laterality: N/A;  . ELBOW SURGERY Left   . GANGLION CYST EXCISION Left 01/22/2016   base of thumb  . neck fusion  07/04/2017   Dr. Carloyn M[place] in [place]  . WRIST SURGERY Right     SOCIAL HISTORY: Social History   Socioeconomic History  . Marital status: Married    Spouse name: Not on file  . Number of children: 2  . Years of education: college  . Highest education level: Not on file  Occupational History    Comment: retired  Scientific laboratory technician  . Financial resource strain: Not on file  . Food insecurity    Worry: Not on file    Inability: Not on file  . Transportation needs    Medical: Not on file    Non-medical: Not on file  Tobacco Use  . Smoking status: Former Smoker    Quit date: 1983    Years since quitting: 37.4  . Smokeless tobacco: Never Used  . Tobacco comment: Quit in 1985  Substance and Sexual Activity  . Alcohol use: Yes    Alcohol/week: 1.0 standard drinks    Types: 1 Cans of beer per week    Comment: Rare   . Drug use: No  . Sexual activity: Not on file  Lifestyle  . Physical activity    Days per week: Not on file    Minutes per session: Not on file  . Stress: Not on file  Relationships  . Social Herbalist on phone: Not on file    Gets together: Not on file    Attends religious service: Not on file    Active member of club or organization: Not on file    Attends meetings of clubs or organizations: Not on file    Relationship status: Not on file  . Intimate partner violence    Fear of current or ex partner: Not on file    Emotionally abused: Not on file    Physically abused: Not on file    Forced sexual activity: Not on file  Other Topics Concern  . Not on file  Social History Narrative   Patient lives at home with her husband [name]).    Education two years of college.   Caffeine two glasses of tea.    FAMILY HISTORY: Family History  Problem Relation Age of Onset  . Diabetes Father   . Stroke Father   . Migraines Daughter   .  Breast cancer Paternal Aunt   . Allergic rhinitis Neg Hx   . Angioedema Neg Hx   . Asthma Neg Hx   . Eczema Neg Hx   . Immunodeficiency Neg Hx   . Urticaria Neg Hx     ALLERGIES:  is allergic to latex; sulfa antibiotics; tape; and valium [diazepam].  MEDICATIONS:  Current Outpatient Medications  Medication Sig Dispense Refill  . azelastine (ASTELIN) 0.1 % nasal spray Place 2 sprays into both nostrils daily. 90 mL 3  . CALCIUM CITRATE PO Take 500 mg by mouth 2 (two) times daily. Bariatric    . dorzolamide (TRUSOPT) 2 % ophthalmic solution Place 1 drop into the right eye 2 (two) times daily.     [name] eletriptan (RELPAX) 40 MG tablet Take 1 tablet  at the onset of migraine. May repeat in 2 hours if headache persists or recurs. Not to exceed 2 tabs/24hours. 9 tablet 12  . EPINEPHrine (EPIPEN 2-PAK) 0.3 mg/0.3 mL IJ SOAJ injection Inject 0.3 mLs (0.3 mg total) into the muscle as needed (for severe life-threatening allergic reaction). 2 Device 1  . Ergocalciferol (VITAMIN D2) 2000 UNITS TABS Take 2,000 Units by mouth daily.     . fexofenadine (ALLEGRA) 180 MG tablet Take 1 tablet (180 mg total) by mouth daily. (Patient taking differently: Take 180 mg by mouth 2 (two) times daily. ) 90 tablet 1  . folic acid (FOLVITE) 1 MG tablet Take 1 mg by mouth daily.     [name] gabapentin (NEURONTIN) 400 MG capsule Take 1 capsule (400 mg total) by mouth 2 (two) times daily. 180 capsule 3  . LOTEMAX 0.5 % ophthalmic suspension Place 1 drop into the right eye 2 (two) times daily.     . Magnesium Oxide 400 MG CAPS Take 400 mg by mouth 2 (two) times daily.     . methotrexate (RHEUMATREX) 2.5 MG tablet As directed by doctor.    . mometasone (NASONEX) 50 MCG/ACT nasal spray Place 2 sprays into the nose daily. Two sprays each in each nostril 51 g 1  . Multiple Vitamin (MULTIVITAMIN) tablet Take 1 tablet by mouth 2 (two) times daily. Bariatric    . Omega-3 Krill Oil 500 MG CAPS Take 1 capsule by mouth daily.     [name]  omeprazole (PRILOSEC) 40 MG capsule     . Riboflavin 100 MG CAPS Take 100 mg by mouth daily. VIT B2    . solifenacin (VESICARE) 10 MG tablet Take 10 mg by mouth daily.    . timolol (TIMOPTIC) 0.5 % ophthalmic solution Place 1 drop into the right eye 2 (two) times daily.     [name] tiZANidine (ZANAFLEX) 4 MG capsule Take 4 mg by mouth 3 (three) times daily as needed for muscle spasms. Take 1 capsule (4 mg total) by mouth Three (3) times a day as needed    . topiramate (TOPAMAX) 100 MG tablet Take 1 tablet (100 mg total) by mouth 2 (two) times daily. 180 tablet 3  . vitamin C (ASCORBIC ACID) 500 MG tablet Take 500 mg by mouth 2 (two) times daily.     No current facility-administered medications for this visit.      PHYSICAL EXAMINATION: ECOG PERFORMANCE STATUS: 1 - Symptomatic but completely ambulatory Vitals:   07/31/18 1042  BP: 121/75  Pulse: 60  Resp: 18  Temp: (!) 95.2 F (35.1 C)   Filed Weights   07/31/18 1042  Weight: 149 lb 4.8 oz (67.7 kg)    Physical Exam Constitutional:      General: She is not in acute distress. HENT:     Head: Normocephalic and atraumatic.  Eyes:     General: No scleral icterus.    Pupils: Pupils are equal, round, and reactive to light.  Neck:     Musculoskeletal: Normal range of motion and neck supple.  Cardiovascular:     Rate and Rhythm: Normal rate and regular rhythm.     Heart sounds: Normal heart sounds.  Pulmonary:     Effort: Pulmonary effort is normal. No respiratory distress.     Breath sounds: No wheezing.  Abdominal:     General: Bowel sounds are normal. There is no distension.     Palpations: Abdomen is soft. There is no mass.     Tenderness: There  is no abdominal tenderness.  Musculoskeletal: Normal range of motion.        General: No deformity.  Skin:    General: Skin is warm and dry.     Findings: No erythema or rash.  Neurological:     Mental Status: She is alert and oriented to person, place, and time.     Cranial Nerves:  No cranial nerve deficit.     Coordination: Coordination normal.  Psychiatric:        Behavior: Behavior normal.        Thought Content: Thought content normal.     RADIOGRAPHIC STUDIES: I have personally reviewed the radiological images as listed and agreed with the findings in the report. 04/03/2018 CT chest abdomen pelvis with contrast Rectosigmoid thickening.  No findings of hepatic metastatic disease or retroperitoneal lymph pulmonary findings or evidence of pulmonary metastatic disease.  A few scattered pulmonary nodules are likely benign.  Recommend attention on future scans.  Surgical changes from gastric bypass surgery but no complicating features were identified.  15 mm right thyroid lobe lesion.  Recommend correlation with thyroid ultrasound examination.   LABORATORY DATA:  I have reviewed the data as listed Lab Results  Component Value Date   WBC 6.0 07/31/2018   HGB 12.9 07/31/2018   HCT 40.2 07/31/2018   MCV 89.7 07/31/2018   PLT 244 07/31/2018   Recent Labs    04/18/18 0416 04/19/18 0256 07/31/18 0947  NA 140 144 142  K 3.6 3.4* 3.6  CL 114* 118* 112*  CO2 20* 21* 23  GLUCOSE 109* 99 106*  BUN 12 7* 13  CREATININE 0.73 0.60 0.74  CALCIUM 7.8* 8.0* 8.9  GFRNONAA >60 >60 >60  GFRAA >60 >60 >60  PROT  --   --  7.4  ALBUMIN  --   --  4.4  AST  --   --  23  ALT  --   --  19  ALKPHOS  --   --  82  BILITOT  --   --  0.7   Iron/TIBC/Ferritin/ %Sat    Component Value Date/Time   IRON 83 07/31/2018 0947   TIBC 411 07/31/2018 0947   FERRITIN 16 07/31/2018 0947   IRONPCTSAT 20 07/31/2018 0947     Preop CEA was obtained on 04/03/2018, at 1.8 04/03/2018, CBC showed hemoglobin 10.8, MCV 84.1, white count 7.8, platelet count 316  Colonoscopy pathology 03/27/2018, done at [facility], results scanned in Epic. 1) specimen 1 polypectomy excision : c sigmoid olonic mucosa with focal glandular epithelium and benign lymphoid aggregate.  No adenomatous glands  identified.  Negative for dysplasia or malignancy. 2) specimen 2: Biopsy, rectosigmoid: Colonic tissue with invasive adenocarcinoma, moderately differentiated.  04/17/2018 Pathology DIAGNOSIS:  A. SIGMOID COLON; SEGMENTAL RESECTION:  - NEGATIVE FOR DYSPLASIA AND MALIGNANCY.  - CARBON BLACK TATTOO PIGMENT PRESENT.  - FIVE LYMPH NODES NEGATIVE FOR MALIGNANCY (0/5).   B. DISTAL SIGMOID AND RECTOSIGMOID COLON; LOW ANTERIOR RESECTION:  - ADENOCARCINOMA INVADING THROUGH THE MUSCULARIS PROPRIA, SEE SUMMARY  BELOW.  - 13 LYMPH NODES NEGATIVE FOR MALIGNANCY (0/13).   C. COLON ANASTOMOSIS MARGINS:  - NEGATIVE FOR MALIGNANCY.   CANCER CASE SUMMARY: COLON AND RECTUM  Procedure: Low anterior resection  Tumor Site: Sigmoid colon  Tumor Size: Greatest dimension: 5.3 cm  Macroscopic Tumor Perforation: Not identified  Histologic Type: Adenocarcinoma  Histologic Grade: G2: Moderately differentiated  Tumor Extension: Tumor invades through the muscularis propria into  peri-colorectal tissue  Margins: All margins are  uninvolved by invasive carcinoma, high-grade  dysplasia, intramucosal adenocarcinoma, and adenoma  Margins examined: Proximal, distal, and mesenteric/radial  Treatment Effect: No known presurgical therapy  Lymphovascular Invasion: Not identified  Perineural Invasion: Not identified  Tumor Deposits: Not identified  Regional Lymph Nodes:    Number of Lymph Nodes Involved: 0    Number of Lymph Nodes Examined: 18   Pathologic Stage Classification (pTNM, AJCC 8th Edition): pT3 pN0   Immunohistochemistry (IHC) Testing for DNA Mismatch Repair (MMR) Proteins:  Results:  MLH1: Intact nuclear expression  MSH2: Intact nuclear expression  MSH6: Intact nuclear expression  PMS2: Intact nuclear expression  IHC Interpretation: No loss of nuclear expression of MMR proteins: Low probability of MSI-H.    ASSESSMENT & PLAN:  1. Bladder spasms   2. Adenocarcinoma of sigmoid colon ([place])    3. Iron deficiency anemia due to chronic blood loss   Cancer Staging Adenocarcinoma of sigmoid colon (HCC) Staging form: Colon and Rectum, AJCC 8th Edition - Clinical: No stage assigned - Unsigned - Pathologic stage from 05/04/2018: Stage IIA (pT3, pN0, cM0) - Signed by Earlie Server, MD on 05/04/2018   # Bladder Spasm, check UA and urine culture.   # Stage IIA sigmoid colon  Continue surveillance.  Recommend history and physical examination every 3-6 months for 2-3 years.  CEA every 3 months. -pre operation CEA normal, 1.8 CT chest abdomen pelvis w contrast Q6 months, will obtain CT in  Sept 2020 She will need repeat colonoscopy one year after surgery.   # Iron deficiency anemia, s/p IV venofer treatments x 3 Labs are reviewed and discussed with patient. Iron panel has improved. Hemoglobin has normalized.  TIBC 411, Ferritin borderline. Will proceed another dose of IV venofer.   Clinically doing well. Recommend repeat CBC, iron TIBC, ferritin, CMP in 3 months.    Orders Placed This Encounter  Procedures  . Urine culture    Standing Status:   Future    Number of Occurrences:   1    Standing Expiration Date:   07/31/2019  . CT Chest W Contrast    Standing Status:   Future    Standing Expiration Date:   07/31/2019    Order Specific Question:   ** REASON FOR EXAM (FREE TEXT)    Answer:   colon cancer follow up    Order Specific Question:   If indicated for the ordered procedure, I authorize the administration of contrast media per Radiology protocol    Answer:   Yes    Order Specific Question:   Preferred imaging location?    Answer:   [facility]    Order Specific Question:   Radiology Contrast Protocol - do NOT remove file path    Answer:   _0 [email or web address]  . CT Abdomen Pelvis W Contrast    Standing Status:   Future    Standing Expiration Date:   07/31/2019    Order Specific Question:   ** REASON FOR EXAM (FREE TEXT)    Answer:   colon cancer  follow up    Order Specific Question:   If indicated for the ordered procedure, I authorize the administration of contrast media per Radiology protocol    Answer:   Yes    Order Specific Question:   Preferred imaging location?    Answer:   [facility]    Order Specific Question:   Is Oral Contrast requested for this exam?    Answer:   Yes, Per Radiology protocol    Order  Specific Question:   Radiology Contrast Protocol - do NOT remove file path    Answer:   _0 [email or web address]  . Urinalysis, Complete w Microscopic  . Urinalysis, Complete w Microscopic    Standing Status:   Future    Standing Expiration Date:   07/31/2019  . CBC with Differential/Platelet    Standing Status:   Future    Standing Expiration Date:   07/31/2019  . Comprehensive metabolic panel    Standing Status:   Future    Standing Expiration Date:   07/31/2019  . CEA    Standing Status:   Future    Standing Expiration Date:   07/31/2019    We spent sufficient time to discuss many aspect of care, questions were answered to patient's satisfaction. Follow-up in 3 months,  after CT scan.Earlie Server, MD, PhD  07/31/2018

## 2018-08-01 LAB — URINE CULTURE: Culture: NO GROWTH

## 2018-08-01 LAB — CEA: CEA: 1.6 ng/mL (ref 0.0–4.7)

## 2018-08-03 ENCOUNTER — Other Ambulatory Visit: Payer: Self-pay | Admitting: Oncology

## 2018-08-16 ENCOUNTER — Ambulatory Visit (INDEPENDENT_AMBULATORY_CARE_PROVIDER_SITE_OTHER): Payer: Medicare Other

## 2018-08-16 DIAGNOSIS — J309 Allergic rhinitis, unspecified: Secondary | ICD-10-CM

## 2018-09-06 ENCOUNTER — Other Ambulatory Visit: Payer: Self-pay

## 2018-09-06 ENCOUNTER — Ambulatory Visit (INDEPENDENT_AMBULATORY_CARE_PROVIDER_SITE_OTHER): Payer: Medicare Other

## 2018-09-06 DIAGNOSIS — J309 Allergic rhinitis, unspecified: Secondary | ICD-10-CM | POA: Diagnosis not present

## 2018-09-25 ENCOUNTER — Telehealth: Payer: Self-pay | Admitting: Adult Health

## 2018-09-25 MED ORDER — TOPIRAMATE 100 MG PO TABS: 100.0000 mg | ORAL_TABLET | Freq: Two times a day (BID) | ORAL | 3 refills | Status: DC

## 2018-09-25 NOTE — Telephone Encounter (Signed)
Pt has called for a refill on her topiramate (TOPAMAX) 100 MG tablet  [facility]

## 2018-09-25 NOTE — Addendum Note (Signed)
Addended by: Brandon Melnick on: 09/25/2018 09:24 AM   Modules accepted: Orders

## 2018-09-25 NOTE — Telephone Encounter (Signed)
Done

## 2018-10-04 ENCOUNTER — Ambulatory Visit (INDEPENDENT_AMBULATORY_CARE_PROVIDER_SITE_OTHER): Payer: Medicare Other

## 2018-10-04 ENCOUNTER — Ambulatory Visit: Payer: Medicare Other | Admitting: Adult Health

## 2018-10-04 DIAGNOSIS — J309 Allergic rhinitis, unspecified: Secondary | ICD-10-CM

## 2018-10-06 NOTE — Progress Notes (Signed)
Exp 10/10/19

## 2018-10-10 DIAGNOSIS — J3089 Other allergic rhinitis: Secondary | ICD-10-CM | POA: Diagnosis not present

## 2018-10-23 ENCOUNTER — Other Ambulatory Visit: Payer: Self-pay | Admitting: Oncology

## 2018-10-23 DIAGNOSIS — C187 Malignant neoplasm of sigmoid colon: Secondary | ICD-10-CM

## 2018-10-23 NOTE — Progress Notes (Signed)
chet

## 2018-10-24 ENCOUNTER — Inpatient Hospital Stay: Payer: Medicare Other | Attending: Oncology

## 2018-10-24 ENCOUNTER — Ambulatory Visit
Admission: RE | Admit: 2018-10-24 | Discharge: 2018-10-24 | Disposition: A | Discharge status: Home / Self Care | Payer: Medicare Other | Source: Ambulatory Visit | Attending: Oncology | Admitting: Oncology

## 2018-10-24 ENCOUNTER — Other Ambulatory Visit: Payer: Self-pay

## 2018-10-24 DIAGNOSIS — D500 Iron deficiency anemia secondary to blood loss (chronic): Secondary | ICD-10-CM | POA: Insufficient documentation

## 2018-10-24 DIAGNOSIS — Z85038 Personal history of other malignant neoplasm of large intestine: Secondary | ICD-10-CM | POA: Insufficient documentation

## 2018-10-24 DIAGNOSIS — C187 Malignant neoplasm of sigmoid colon: Secondary | ICD-10-CM | POA: Insufficient documentation

## 2018-10-24 LAB — POCT I-STAT CREATININE: Creatinine, Ser: 0.7 mg/dL (ref 0.44–1.00)

## 2018-10-24 MED ORDER — IOHEXOL 300 MG/ML  SOLN
100.0000 mL | Freq: Once | INTRAMUSCULAR | Status: AC | PRN
  Administered 2018-10-24: 10:00:00 100 mL via INTRAVENOUS

## 2018-10-25 ENCOUNTER — Ambulatory Visit (INDEPENDENT_AMBULATORY_CARE_PROVIDER_SITE_OTHER): Payer: Medicare Other

## 2018-10-25 DIAGNOSIS — J309 Allergic rhinitis, unspecified: Secondary | ICD-10-CM

## 2018-10-27 NOTE — Progress Notes (Signed)
Patient is coming in for follow up, she is doing well no complaints.

## 2018-10-30 ENCOUNTER — Inpatient Hospital Stay (HOSPITAL_BASED_OUTPATIENT_CLINIC_OR_DEPARTMENT_OTHER): Payer: Medicare Other | Admitting: Oncology

## 2018-10-30 ENCOUNTER — Other Ambulatory Visit: Payer: Medicare Other

## 2018-10-30 ENCOUNTER — Inpatient Hospital Stay: Payer: Medicare Other

## 2018-10-30 ENCOUNTER — Encounter: Payer: Self-pay | Admitting: Oncology

## 2018-10-30 ENCOUNTER — Other Ambulatory Visit: Payer: Self-pay

## 2018-10-30 VITALS — BP 125/89 | HR 61 | Temp 98.2°F | Resp 16 | Wt 147.0 lb

## 2018-10-30 DIAGNOSIS — D500 Iron deficiency anemia secondary to blood loss (chronic): Secondary | ICD-10-CM

## 2018-10-30 DIAGNOSIS — C187 Malignant neoplasm of sigmoid colon: Secondary | ICD-10-CM | POA: Diagnosis not present

## 2018-10-30 DIAGNOSIS — Z85038 Personal history of other malignant neoplasm of large intestine: Secondary | ICD-10-CM | POA: Diagnosis not present

## 2018-10-30 LAB — CBC WITH DIFFERENTIAL/PLATELET
Abs Immature Granulocytes: 0.03 10*3/uL (ref 0.00–0.07)
Basophils Absolute: 0.1 10*3/uL (ref 0.0–0.1)
Basophils Relative: 1 %
Eosinophils Absolute: 0.2 10*3/uL (ref 0.0–0.5)
Eosinophils Relative: 3 %
HCT: 42.3 % (ref 36.0–46.0)
Hemoglobin: 13.6 g/dL (ref 12.0–15.0)
Immature Granulocytes: 0 %
Lymphocytes Relative: 32 %
Lymphs Abs: 2.4 10*3/uL (ref 0.7–4.0)
MCH: 30.3 pg (ref 26.0–34.0)
MCHC: 32.2 g/dL (ref 30.0–36.0)
MCV: 94.2 fL (ref 80.0–100.0)
Monocytes Absolute: 0.7 10*3/uL (ref 0.1–1.0)
Monocytes Relative: 9 %
Neutro Abs: 4.1 10*3/uL (ref 1.7–7.7)
Neutrophils Relative %: 55 %
Platelets: 233 10*3/uL (ref 150–400)
RBC: 4.49 MIL/uL (ref 3.87–5.11)
RDW: 15.2 % (ref 11.5–15.5)
WBC: 7.4 10*3/uL (ref 4.0–10.5)
nRBC: 0 % (ref 0.0–0.2)

## 2018-10-30 LAB — COMPREHENSIVE METABOLIC PANEL
ALT: 27 U/L (ref 0–44)
AST: 30 U/L (ref 15–41)
Albumin: 4.4 g/dL (ref 3.5–5.0)
Alkaline Phosphatase: 82 U/L (ref 38–126)
Anion gap: 11 (ref 5–15)
BUN: 12 mg/dL (ref 8–23)
CO2: 24 mmol/L (ref 22–32)
Calcium: 9.8 mg/dL (ref 8.9–10.3)
Chloride: 108 mmol/L (ref 98–111)
Creatinine, Ser: 0.82 mg/dL (ref 0.44–1.00)
GFR calc Af Amer: >60 mL/min (ref >60)
GFR calc non Af Amer: >60 mL/min (ref >60)
Glucose, Bld: 109 mg/dL — ABNORMAL HIGH (ref 70–99)
Potassium: 4.1 mmol/L (ref 3.5–5.1)
Sodium: 143 mmol/L (ref 135–145)
Total Bilirubin: 0.7 mg/dL (ref 0.3–1.2)
Total Protein: 7.2 g/dL (ref 6.5–8.1)

## 2018-10-30 NOTE — Progress Notes (Signed)
Hematology/Oncology follow up  [facility] Telephone:([phone]) [phone] Fax:(336) [phone]   Patient Care Team: Sander Radon, NP as PCP - General (Family Medicine) Dorthula Rue., MD as Referring Physician (Otolaryngology) Clent Jacks, RN as Registered Nurse  REFERRING PROVIDER: Dr.Toledo / Gerarda Gunther 'CHIEF COMPLAINTS/REASON FOR VISIT:  Follow up Stage IIA colorectal cancer  HISTORY OF PRESENTING ILLNESS:  Jordan Mahoney is a  68 y.o.  female with PMH listed below who was referred to me for evaluation of stage IIA colorectal cancer. Patient was initially seen care physician for evaluation of fatigue.  Lab work-up showed that patient's anemic.  Hemoccult was obtained and was positive.  Patient was referred to [facility] and was seen and evaluated by Dr. Alice Reichert.  Reports that she had 2-3 bowel movement daily sometimes mushy and sometimes formed.  Denies seeing any bright red blood in the stool.  Denies any associated abdominal pain, gastric discomfort.  Appetite seems to be stable.  History of gastric bypass in 2010.  Cholecystectomy in 2000.  ?Unintentional weight loss 5 to 6 pounds for the past 2 months.   prior records showed she weighs 149 pounds on 09/28/2017, weighed 160 pounds 05/03/2017 Today she weighs 153 pounds Colonoscopy was obtained on 03/27/2018.  6 mm polyp found in sigmoid colon.  Polyp is sessile. An ulcerated partially obstructing mass was found in the rectosigmoid colon.  Mass was circumferential.  Measured 5 cm in length.  No bleeding was present.  Biopsy was taken.  Nonbleeding internal hemorrhoids were found during retroflexion.  # underwent surgery.  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 No loss of nuclear expression of MMR proteins: Low probability of MSI-H.  Stage IIA, No adjuvant chemotherapy was not  Offered  INTERVAL  HISTORY Jordan Mahoney is a 68 y.o. female who has above history reviewed by me today presents for follow up visit for management of Stage IIA colorectal cancer. Problems and complaints are listed below: Patient reports doing well.  Denies any fever, chills, nausea, vomiting, chest pain, abdominal pain Appetite is good.  Lost 2 pounds since June 2020.   Review of Systems  Constitutional: Negative for appetite change, chills, fatigue and fever.  HENT:   Negative for hearing loss and voice change.   Eyes: Negative for eye problems.  Respiratory: Negative for chest tightness and cough.   Cardiovascular: Negative for chest pain.  Gastrointestinal: Negative for abdominal distention, abdominal pain and blood in stool.  Endocrine: Negative for hot flashes.  Genitourinary: Negative for difficulty urinating and frequency.        Bladder spasm  Musculoskeletal: Negative for arthralgias.  Skin: Negative for itching and rash.  Neurological: Negative for extremity weakness.  Hematological: Negative for adenopathy.  Psychiatric/Behavioral: Negative for confusion. The patient is not nervous/anxious.     MEDICAL HISTORY:  Past Medical History:  Diagnosis Date  . Allergic rhinoconjunctivitis   . Cancer ([place])   . HA (headache)   . Hearing loss    wearing bilateral aides    SURGICAL HISTORY: Past Surgical History:  Procedure Laterality Date  . belly button    . c sections    . CATARACT EXTRACTION Right   . COLON RESECTION N/A 04/17/2018   Procedure: LAPAROSCOPIC COLON RESECTION POSSIBLE OSTOMY;  Surgeon: Benjamine Sprague, DO;  Location: [facility] ORS;  Service: General;  Laterality: N/A;  . ELBOW SURGERY Left   . GANGLION CYST EXCISION Left  01/22/2016   base of thumb  . neck fusion  07/04/2017   Dr. Carloyn Manner in [place]  . WRIST SURGERY Right     SOCIAL HISTORY: Social History   Socioeconomic History  . Marital status: Married    Spouse name: Not on file  . Number of children: 2  . Years of  education: college  . Highest education level: Not on file  Occupational History    Comment: retired  Scientific laboratory technician  . Financial resource strain: Not on file  . Food insecurity    Worry: Not on file    Inability: Not on file  . Transportation needs    Medical: Not on file    Non-medical: Not on file  Tobacco Use  . Smoking status: Former Smoker    Quit date: 1983    Years since quitting: 37.7  . Smokeless tobacco: Never Used  . Tobacco comment: Quit in 1985  Substance and Sexual Activity  . Alcohol use: Yes    Alcohol/week: 1.0 standard drinks    Types: 1 Cans of beer per week    Comment: Rare   . Drug use: No  . Sexual activity: Not on file  Lifestyle  . Physical activity    Days per week: Not on file    Minutes per session: Not on file  . Stress: Not on file  Relationships  . Social Herbalist on phone: Not on file    Gets together: Not on file    Attends religious service: Not on file    Active member of club or organization: Not on file    Attends meetings of clubs or organizations: Not on file    Relationship status: Not on file  . Intimate partner violence    Fear of current or ex partner: Not on file    Emotionally abused: Not on file    Physically abused: Not on file    Forced sexual activity: Not on file  Other Topics Concern  . Not on file  Social History Narrative   Patient lives at home with her husband [name]).    Education two years of college.   Caffeine two glasses of tea.    FAMILY HISTORY: Family History  Problem Relation Age of Onset  . Diabetes Father   . Stroke Father   . Migraines Daughter   . Breast cancer Paternal Aunt   . Allergic rhinitis Neg Hx   . Angioedema Neg Hx   . Asthma Neg Hx   . Eczema Neg Hx   . Immunodeficiency Neg Hx   . Urticaria Neg Hx     ALLERGIES:  is allergic to latex; sulfa antibiotics; tape; and valium [diazepam].  MEDICATIONS:  Current Outpatient Medications  Medication Sig Dispense Refill   . azelastine (ASTELIN) 0.1 % nasal spray Place 2 sprays into both nostrils daily. 90 mL 3  . bacitracin-polymyxin b (POLYSPORIN) ophthalmic ointment Place into the left eye 4 times daily.    . brimonidine (ALPHAGAN) 0.2 % ophthalmic solution Place 1 drop into the right eye 3 times daily.    [name] CALCIUM CITRATE PO Take 500 mg by mouth 2 (two) times daily. Bariatric    . dorzolamide (TRUSOPT) 2 % ophthalmic solution Place 1 drop into the right eye 2 (two) times daily.     . dorzolamide-timolol (COSOPT) 22.3-6.8 MG/ML ophthalmic solution Place 1 drop into the right eye 2 times daily.    [name] eletriptan (RELPAX) 40 MG tablet Take 1  tablet at the onset of migraine. May repeat in 2 hours if headache persists or recurs. Not to exceed 2 tabs/24hours. 9 tablet 12  . Ergocalciferol (VITAMIN D2) 2000 UNITS TABS Take 2,000 Units by mouth daily.     . fexofenadine (ALLEGRA) 180 MG tablet Take 1 tablet (180 mg total) by mouth daily. (Patient taking differently: Take 180 mg by mouth 2 (two) times daily. ) 90 tablet 1  . folic acid (FOLVITE) 1 MG tablet Take 1 mg by mouth daily.     [name] gabapentin (NEURONTIN) 400 MG capsule Take 1 capsule (400 mg total) by mouth 2 (two) times daily. 180 capsule 3  . LOTEMAX 0.5 % ophthalmic suspension Place 1 drop into the right eye 2 (two) times daily.     . Magnesium Oxide 400 MG CAPS Take 400 mg by mouth 2 (two) times daily.     . methotrexate (RHEUMATREX) 2.5 MG tablet As directed by doctor.    . mometasone (NASONEX) 50 MCG/ACT nasal spray Place 2 sprays into the nose daily. Two sprays each in each nostril 51 g 1  . Multiple Vitamin (MULTIVITAMIN) tablet Take 1 tablet by mouth 2 (two) times daily. Bariatric    . Omega-3 Krill Oil 500 MG CAPS Take 1 capsule by mouth daily.     [name] omeprazole (PRILOSEC) 40 MG capsule     . prednisoLONE acetate (PRED FORTE) 1 % ophthalmic suspension Place 1 drop into the right eye 8 times daily.    . Riboflavin 100 MG CAPS Take 100 mg by mouth daily.  VIT B2    . solifenacin (VESICARE) 10 MG tablet Take 10 mg by mouth daily.    . timolol (TIMOPTIC) 0.5 % ophthalmic solution Place 1 drop into the right eye 2 (two) times daily.     [name] tiZANidine (ZANAFLEX) 4 MG capsule Take 4 mg by mouth 3 (three) times daily as needed for muscle spasms. Take 1 capsule (4 mg total) by mouth Three (3) times a day as needed    . topiramate (TOPAMAX) 100 MG tablet Take 1 tablet (100 mg total) by mouth 2 (two) times daily. 180 tablet 3  . vitamin C (ASCORBIC ACID) 500 MG tablet Take 500 mg by mouth 2 (two) times daily.     No current facility-administered medications for this visit.      PHYSICAL EXAMINATION: ECOG PERFORMANCE STATUS: 1 - Symptomatic but completely ambulatory Vitals:   10/30/18 1001  BP: 125/89  Pulse: 61  Resp: 16  Temp: 98.2 F (36.8 C)   Filed Weights   10/30/18 1001  Weight: 147 lb (66.7 kg)    Physical Exam Constitutional:      General: She is not in acute distress. HENT:     Head: Normocephalic and atraumatic.  Eyes:     General: No scleral icterus.    Pupils: Pupils are equal, round, and reactive to light.  Neck:     Musculoskeletal: Normal range of motion and neck supple.  Cardiovascular:     Rate and Rhythm: Normal rate and regular rhythm.     Heart sounds: Normal heart sounds.  Pulmonary:     Effort: Pulmonary effort is normal. No respiratory distress.     Breath sounds: No wheezing.  Abdominal:     General: Bowel sounds are normal. There is no distension.     Palpations: Abdomen is soft. There is no mass.     Tenderness: There is no abdominal tenderness.  Musculoskeletal: Normal range of motion.  General: No deformity.  Skin:    General: Skin is warm and dry.     Findings: No erythema or rash.  Neurological:     Mental Status: She is alert and oriented to person, place, and time.     Cranial Nerves: No cranial nerve deficit.     Coordination: Coordination normal.  Psychiatric:        Behavior:  Behavior normal.        Thought Content: Thought content normal.     RADIOGRAPHIC STUDIES: I have personally reviewed the radiological images as listed and agreed with the findings in the report. 04/03/2018 CT chest abdomen pelvis with contrast Rectosigmoid thickening.  No findings of hepatic metastatic disease or retroperitoneal lymph pulmonary findings or evidence of pulmonary metastatic disease.  A few scattered pulmonary nodules are likely benign.  Recommend attention on future scans.  Surgical changes from gastric bypass surgery but no complicating features were identified.  15 mm right thyroid lobe lesion.  Recommend correlation with thyroid ultrasound examination.   LABORATORY DATA:  I have reviewed the data as listed Lab Results  Component Value Date   WBC 7.4 10/30/2018   HGB 13.6 10/30/2018   HCT 42.3 10/30/2018   MCV 94.2 10/30/2018   PLT 233 10/30/2018   Recent Labs    04/19/18 0256 07/31/18 0947 10/24/18 1027 10/30/18 1055  NA 144 142  --  143  K 3.4* 3.6  --  4.1  CL 118* 112*  --  108  CO2 21* 23  --  24  GLUCOSE 99 106*  --  109*  BUN 7* 13  --  12  CREATININE 0.60 0.74 0.70 0.82  CALCIUM 8.0* 8.9  --  9.8  GFRNONAA >60 >60  --  >60  GFRAA >60 >60  --  >60  PROT  --  7.4  --  7.2  ALBUMIN  --  4.4  --  4.4  AST  --  23  --  30  ALT  --  19  --  27  ALKPHOS  --  82  --  82  BILITOT  --  0.7  --  0.7   Iron/TIBC/Ferritin/ %Sat    Component Value Date/Time   IRON 83 07/31/2018 0947   TIBC 411 07/31/2018 0947   FERRITIN 16 07/31/2018 0947   IRONPCTSAT 20 07/31/2018 0947     Preop CEA was obtained on 04/03/2018, at 1.8 04/03/2018, CBC showed hemoglobin 10.8, MCV 84.1, white count 7.8, platelet count 316  Colonoscopy pathology 03/27/2018, done at [facility], results scanned in Epic. 1) specimen 1 polypectomy excision : c sigmoid olonic mucosa with focal glandular epithelium and benign lymphoid aggregate.  No adenomatous glands identified.  Negative  for dysplasia or malignancy. 2) specimen 2: Biopsy, rectosigmoid: Colonic tissue with invasive adenocarcinoma, moderately differentiated.  04/17/2018 Pathology DIAGNOSIS:  A. SIGMOID COLON; SEGMENTAL RESECTION:  - NEGATIVE FOR DYSPLASIA AND MALIGNANCY.  - CARBON BLACK TATTOO PIGMENT PRESENT.  - FIVE LYMPH NODES NEGATIVE FOR MALIGNANCY (0/5).   B. DISTAL SIGMOID AND RECTOSIGMOID COLON; LOW ANTERIOR RESECTION:  - ADENOCARCINOMA INVADING THROUGH THE MUSCULARIS PROPRIA, SEE SUMMARY  BELOW.  - 13 LYMPH NODES NEGATIVE FOR MALIGNANCY (0/13).   C. COLON ANASTOMOSIS MARGINS:  - NEGATIVE FOR MALIGNANCY.   CANCER CASE SUMMARY: COLON AND RECTUM  Procedure: Low anterior resection  Tumor Site: Sigmoid colon  Tumor Size: Greatest dimension: 5.3 cm  Macroscopic Tumor Perforation: Not identified  Histologic Type: Adenocarcinoma  Histologic Grade: G2: Moderately  differentiated  Tumor Extension: Tumor invades through the muscularis propria into  peri-colorectal tissue  Margins: All margins are uninvolved by invasive carcinoma, high-grade  dysplasia, intramucosal adenocarcinoma, and adenoma  Margins examined: Proximal, distal, and mesenteric/radial  Treatment Effect: No known presurgical therapy  Lymphovascular Invasion: Not identified  Perineural Invasion: Not identified  Tumor Deposits: Not identified  Regional Lymph Nodes:    Number of Lymph Nodes Involved: 0    Number of Lymph Nodes Examined: 18   Pathologic Stage Classification (pTNM, AJCC 8th Edition): pT3 pN0   Immunohistochemistry (IHC) Testing for DNA Mismatch Repair (MMR) Proteins:  Results:  MLH1: Intact nuclear expression  MSH2: Intact nuclear expression  MSH6: Intact nuclear expression  PMS2: Intact nuclear expression  IHC Interpretation: No loss of nuclear expression of MMR proteins: Low probability of MSI-H.    ASSESSMENT & PLAN:  1. Adenocarcinoma of sigmoid colon ([place])   2. Iron deficiency anemia due to chronic  blood loss   Cancer Staging Adenocarcinoma of sigmoid colon (HCC) Staging form: Colon and Rectum, AJCC 8th Edition - Clinical: No stage assigned - Unsigned - Pathologic stage from 05/04/2018: Stage IIA (pT3, pN0, cM0) - Signed by Earlie Server, MD on 05/04/2018  # Stage IIA sigmoid colon  Clinically doing well, continue surveillance. Recommend history and physical examination every 3 to 6 months for the first 2 to 3 years .  CEA every 3 months. CT chest abdomen pelvis with contrast every 6 months. CT images were independently reviewed by me.  No evidence of recurrence or metastatic disease Patient will need repeat colonoscopy 1 year after surgery  # Iron deficiency anemia, s/p IV venofer treatments x 3 resolved.  Hemoglobin stable. No need for additional IV iron  Clinically doing well. Recommend repeat CBC,CMP CEA  in 3 months.    Orders Placed This Encounter  Procedures  . CBC with Differential/Platelet    Standing Status:   Future    Standing Expiration Date:   10/30/2019  . Comprehensive metabolic panel    Standing Status:   Future    Standing Expiration Date:   10/30/2019  . CEA    Standing Status:   Future    Standing Expiration Date:   10/30/2019    We spent sufficient time to discuss many aspect of care, questions were answered to patient's satisfaction. Follow-up in 3 months,  Earlie Server, MD, PhD  10/30/2018

## 2018-10-31 LAB — CEA: CEA: 1.9 ng/mL (ref 0.0–4.7)

## 2018-11-15 ENCOUNTER — Ambulatory Visit (INDEPENDENT_AMBULATORY_CARE_PROVIDER_SITE_OTHER): Payer: Medicare Other

## 2018-11-15 DIAGNOSIS — J309 Allergic rhinitis, unspecified: Secondary | ICD-10-CM | POA: Diagnosis not present

## 2018-11-24 ENCOUNTER — Other Ambulatory Visit: Payer: Self-pay | Admitting: Surgery

## 2018-11-24 ENCOUNTER — Other Ambulatory Visit (HOSPITAL_COMMUNITY): Payer: Self-pay | Admitting: Surgery

## 2018-11-24 DIAGNOSIS — R1033 Periumbilical pain: Secondary | ICD-10-CM

## 2018-11-30 ENCOUNTER — Other Ambulatory Visit: Payer: Self-pay

## 2018-11-30 ENCOUNTER — Ambulatory Visit
Admission: RE | Admit: 2018-11-30 | Discharge: 2018-11-30 | Disposition: A | Discharge status: Home / Self Care | Payer: Medicare Other | Source: Ambulatory Visit | Attending: Surgery | Admitting: Surgery

## 2018-11-30 DIAGNOSIS — R1033 Periumbilical pain: Secondary | ICD-10-CM | POA: Insufficient documentation

## 2018-12-13 ENCOUNTER — Ambulatory Visit (INDEPENDENT_AMBULATORY_CARE_PROVIDER_SITE_OTHER): Payer: Medicare Other | Admitting: *Deleted

## 2018-12-13 DIAGNOSIS — J309 Allergic rhinitis, unspecified: Secondary | ICD-10-CM

## 2018-12-20 ENCOUNTER — Ambulatory Visit (INDEPENDENT_AMBULATORY_CARE_PROVIDER_SITE_OTHER): Payer: Medicare Other

## 2018-12-20 DIAGNOSIS — J309 Allergic rhinitis, unspecified: Secondary | ICD-10-CM

## 2018-12-26 ENCOUNTER — Other Ambulatory Visit: Payer: Self-pay

## 2018-12-26 ENCOUNTER — Encounter: Payer: Self-pay | Admitting: Adult Health

## 2018-12-26 ENCOUNTER — Ambulatory Visit (INDEPENDENT_AMBULATORY_CARE_PROVIDER_SITE_OTHER): Payer: Medicare Other | Admitting: Adult Health

## 2018-12-26 VITALS — BP 111/69 | HR 60 | Temp 97.9°F | Ht 65.0 in | Wt 156.4 lb

## 2018-12-26 DIAGNOSIS — G43019 Migraine without aura, intractable, without status migrainosus: Secondary | ICD-10-CM | POA: Diagnosis not present

## 2018-12-26 MED ORDER — AIMOVIG 140 MG/ML ~~LOC~~ SOAJ: 140.0000 mg | SUBCUTANEOUS | 11 refills | Status: DC

## 2018-12-26 NOTE — Progress Notes (Signed)
PATIENT: Jordan Mahoney DOB: January 12, 1951  REASON FOR VISIT: follow up HISTORY FROM: patient  HISTORY OF PRESENT ILLNESS: Today 12/26/18:  Jordan Mahoney is a 68 year old female with a history of migraine headaches.  She returns today for follow-up.  She states that the barometric pressure is a trigger for her headaches.  Her husband is with her and reports that she has a headache daily.  She confirms that she always has a mild headache but typically 4-5 times a week she will have a severe headache.  She continues on gabapentin and Topamax.  She also has Relpax to use.  She reports that she typically has to go to sleep in a cold dark room in order to get benefit from her severe headaches.  HISTORY 06/22/18:  Jordan Mahoney is a 68 year old female with a history of migraine headaches.  She returns today for a virtual visit.  She states that her headaches have gotten slightly worse.  She states that she has a daily headache.  They always occur in the sinus area.  She states that barometric pressure is a trigger for her.  With her headache she sometimes has nausea but no vomiting.  She does report photophobia and phonophobia.  She states that she has been taking Relpax daily.  She states that it helped initially but then the headache returned.  She has had 2 surgeries in the last year.  1 neck fusion and colon cancer surgery.  She is currently on gabapentin for numbness and tingling in arms and hands after neck fusion.  She joins me today for virtual visit.  REVIEW OF SYSTEMS: Out of a complete 14 system review of symptoms, the patient complains only of the following symptoms, and all other reviewed systems are negative.  See HPI  ALLERGIES: Allergies  Allergen Reactions  . Latex   . Sulfa Antibiotics Other (See Comments)    Causes skin reaction ( on her mid back)  Pigmentation.   . Tape   . Valium [Diazepam]     HOME MEDICATIONS: Outpatient Medications Prior to Visit  Medication Sig  Dispense Refill  . azelastine (ASTELIN) 0.1 % nasal spray Place 2 sprays into both nostrils daily. 90 mL 3  . CALCIUM CITRATE PO Take 500 mg by mouth 2 (two) times daily. Bariatric    . eletriptan (RELPAX) 40 MG tablet Take 1 tablet at the onset of migraine. May repeat in 2 hours if headache persists or recurs. Not to exceed 2 tabs/24hours. 9 tablet 12  . Ergocalciferol (VITAMIN D2) 2000 UNITS TABS Take 2,000 Units by mouth daily.     . fexofenadine (ALLEGRA) 180 MG tablet Take 1 tablet (180 mg total) by mouth daily. (Patient taking differently: Take 180 mg by mouth 2 (two) times daily. ) 90 tablet 1  . folic acid (FOLVITE) 1 MG tablet Take 1 mg by mouth daily.     [name] gabapentin (NEURONTIN) 400 MG capsule Take 1 capsule (400 mg total) by mouth 2 (two) times daily. 180 capsule 3  . Magnesium Oxide 400 MG CAPS Take 400 mg by mouth 2 (two) times daily.     . methotrexate (RHEUMATREX) 2.5 MG tablet As directed by doctor.    . mometasone (NASONEX) 50 MCG/ACT nasal spray Place 2 sprays into the nose daily. Two sprays each in each nostril 51 g 1  . Multiple Vitamin (MULTIVITAMIN) tablet Take 1 tablet by mouth 2 (two) times daily. Bariatric    . Omega-3 Krill Oil 500 MG CAPS  Take 1 capsule by mouth daily.     [name] omeprazole (PRILOSEC) 40 MG capsule     . Riboflavin 100 MG CAPS Take 100 mg by mouth daily. VIT B2    . solifenacin (VESICARE) 10 MG tablet Take 10 mg by mouth daily.    [name] tiZANidine (ZANAFLEX) 4 MG capsule Take 4 mg by mouth 3 (three) times daily as needed for muscle spasms. Take 1 capsule (4 mg total) by mouth Three (3) times a day as needed    . topiramate (TOPAMAX) 100 MG tablet Take 1 tablet (100 mg total) by mouth 2 (two) times daily. 180 tablet 3  . vitamin C (ASCORBIC ACID) 500 MG tablet Take 500 mg by mouth 2 (two) times daily.    . bacitracin-polymyxin b (POLYSPORIN) ophthalmic ointment Place into the left eye 4 times daily.    . brimonidine (ALPHAGAN) 0.2 % ophthalmic solution Place 1  drop into the right eye 3 times daily.    . dorzolamide (TRUSOPT) 2 % ophthalmic solution Place 1 drop into the right eye 2 (two) times daily.     . dorzolamide-timolol (COSOPT) 22.3-6.8 MG/ML ophthalmic solution Place 1 drop into the right eye 2 times daily.    [name] LOTEMAX 0.5 % ophthalmic suspension Place 1 drop into the right eye 2 (two) times daily.     . prednisoLONE acetate (PRED FORTE) 1 % ophthalmic suspension Place 1 drop into the right eye 8 times daily.    . timolol (TIMOPTIC) 0.5 % ophthalmic solution Place 1 drop into the right eye 2 (two) times daily.      No facility-administered medications prior to visit.     PAST MEDICAL HISTORY: Past Medical History:  Diagnosis Date  . Allergic rhinoconjunctivitis   . Cancer ([place])   . HA (headache)   . Hearing loss    wearing bilateral aides    PAST SURGICAL HISTORY: Past Surgical History:  Procedure Laterality Date  . belly button    . c sections    . CATARACT EXTRACTION Right   . COLON RESECTION N/A 04/17/2018   Procedure: LAPAROSCOPIC COLON RESECTION POSSIBLE OSTOMY;  Surgeon: Benjamine Sprague, DO;  Location: [facility] ORS;  Service: General;  Laterality: N/A;  . ELBOW SURGERY Left   . GANGLION CYST EXCISION Left 01/22/2016   base of thumb  . neck fusion  07/04/2017   Dr. Carloyn Mahoney in [place]  . WRIST SURGERY Right     FAMILY HISTORY: Family History  Problem Relation Age of Onset  . Diabetes Father   . Stroke Father   . Migraines Daughter   . Breast cancer Paternal Aunt   . Allergic rhinitis Neg Hx   . Angioedema Neg Hx   . Asthma Neg Hx   . Eczema Neg Hx   . Immunodeficiency Neg Hx   . Urticaria Neg Hx     SOCIAL HISTORY: Social History   Socioeconomic History  . Marital status: Married    Spouse name: Not on file  . Number of children: 2  . Years of education: college  . Highest education level: Not on file  Occupational History    Comment: retired  Scientific laboratory technician  . Financial resource strain: Not on file  . Food  insecurity    Worry: Not on file    Inability: Not on file  . Transportation needs    Medical: Not on file    Non-medical: Not on file  Tobacco Use  . Smoking status: Former Smoker  Quit date: 25    Years since quitting: 37.8  . Smokeless tobacco: Never Used  . Tobacco comment: Quit in 1985  Substance and Sexual Activity  . Alcohol use: Yes    Alcohol/week: 1.0 standard drinks    Types: 1 Cans of beer per week    Comment: Rare   . Drug use: No  . Sexual activity: Not on file  Lifestyle  . Physical activity    Days per week: Not on file    Minutes per session: Not on file  . Stress: Not on file  Relationships  . Social Herbalist on phone: Not on file    Gets together: Not on file    Attends religious service: Not on file    Active member of club or organization: Not on file    Attends meetings of clubs or organizations: Not on file    Relationship status: Not on file  . Intimate partner violence    Fear of current or ex partner: Not on file    Emotionally abused: Not on file    Physically abused: Not on file    Forced sexual activity: Not on file  Other Topics Concern  . Not on file  Social History Narrative   Patient lives at home with her husband [name]).    Education two years of college.   Caffeine two glasses of tea.      PHYSICAL EXAM  Vitals:   12/26/18 0911  BP: 111/69  Pulse: 60  Temp: 97.9 F (36.6 C)  TempSrc: Oral  Weight: 156 lb 6.4 oz (70.9 kg)  Height: 5\' 5"  (1.651 m)   Body mass index is 26.03 kg/m.  Generalized: Well developed, in no acute distress   Neurological examination  Mentation: Alert oriented to time, place, history taking. Follows all commands speech and language fluent Cranial nerve II-XII: Right pupil is larger than the left pupil.  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 extraocular movements were full, visual field were full on confrontational test.Head turning and shoulder shrug  were normal and  symmetric. Motor: The motor testing reveals 5 over 5 strength of all 4 extremities. Good symmetric motor tone is noted throughout.  Sensory: Sensory testing is intact to soft touch on all 4 extremities. No evidence of extinction is noted.  Coordination: Cerebellar testing reveals good finger-nose-finger and heel-to-shin bilaterally.  Gait and station: Gait is normal.     DIAGNOSTIC DATA (LABS, IMAGING, TESTING) - I reviewed patient records, labs, notes, testing and imaging myself where available.  Lab Results  Component Value Date   WBC 7.4 10/30/2018   HGB 13.6 10/30/2018   HCT 42.3 10/30/2018   MCV 94.2 10/30/2018   PLT 233 10/30/2018      Component Value Date/Time   NA 143 10/30/2018 1055   K 4.1 10/30/2018 1055   CL 108 10/30/2018 1055   CO2 24 10/30/2018 1055   GLUCOSE 109 (H) 10/30/2018 1055   BUN 12 10/30/2018 1055   CREATININE 0.82 10/30/2018 1055   CALCIUM 9.8 10/30/2018 1055   PROT 7.2 10/30/2018 1055   ALBUMIN 4.4 10/30/2018 1055   AST 30 10/30/2018 1055   ALT 27 10/30/2018 1055   ALKPHOS 82 10/30/2018 1055   BILITOT 0.7 10/30/2018 1055   GFRNONAA >60 10/30/2018 1055   GFRAA >60 10/30/2018 1055      ASSESSMENT AND PLAN 68 y.o. year old female  has a past medical history of Allergic rhinoconjunctivitis, Cancer ([place]),  HA (headache), and Hearing loss. here with :  1.  Migraine headaches  - Continue gabapentin and Topamax - Start Aimovig 140 mg monthly -Reviewed potential side effects of Aimovig with the patient -Advised to keep a headache journal  Patient was advised that if her headache severity or frequency does not improve she should let us know.  She will call in 2 to 3 months with an update.  She will follow-up with an office visit in 6 months.     I spent 15 minutes with the patient. 50% of this time was spent discussing plan of care   Ward Givens, MSN, NP-C 12/26/2018, 9:41 AM [facility] [address] [place], [place] [address] [phone]

## 2018-12-26 NOTE — Patient Instructions (Signed)
Your Plan:  Continue gabapentin and topamax Start Aimovig If your symptoms worsen or you develop new symptoms please let us know.    Thank you for coming to see [place] at [facility]. I hope we have been able to provide you high quality care today.  You may receive a patient satisfaction survey over the next few weeks. We would appreciate your feedback and comments so that we may continue to improve ourselves and the health of our patients.

## 2018-12-27 ENCOUNTER — Ambulatory Visit (INDEPENDENT_AMBULATORY_CARE_PROVIDER_SITE_OTHER): Payer: Medicare Other

## 2018-12-27 DIAGNOSIS — J309 Allergic rhinitis, unspecified: Secondary | ICD-10-CM | POA: Diagnosis not present

## 2019-01-01 NOTE — Progress Notes (Signed)
I have reviewed and agreed above plan. 

## 2019-01-03 ENCOUNTER — Ambulatory Visit (INDEPENDENT_AMBULATORY_CARE_PROVIDER_SITE_OTHER): Payer: Medicare Other

## 2019-01-03 DIAGNOSIS — J309 Allergic rhinitis, unspecified: Secondary | ICD-10-CM | POA: Diagnosis not present

## 2019-01-08 ENCOUNTER — Telehealth: Payer: Self-pay | Admitting: Adult Health

## 2019-01-08 NOTE — Telephone Encounter (Signed)
1) Medication(s) Requested (by name): Erenumab-aooe (AIMOVIG) 140 MG/ML SOAJ     2) Pharmacy of Choice:  [facility], [place] [facility]   Patient called stating that she was advised by her pharmacy that a prior Josem Kaufmann will now be needed in order to get her medication. Please follow up.

## 2019-01-09 ENCOUNTER — Encounter: Payer: Self-pay | Admitting: Neurology

## 2019-01-09 NOTE — Telephone Encounter (Signed)
Called the patient. Our office has not received anything from the pharmacy indicating a PA is needed. I was going to move forward with completing the PA but It doesn't appear that we have a prescription insurance card on file. I see medicare listed.  LVM for the patient to call back or reply to [employer].  If patient calls back please ask if she has a prescription benefit card if so we would need that information to complete the PA. Name of the Benefit MEMBER ID# RX BIN# RX PCN# RX GROUP# Provider number typically on the back of the card in case we have to call.  Thanks

## 2019-01-09 NOTE — Telephone Encounter (Signed)
Completed a PA for the pt through cover my meds/express scripts. [id number] PA was immediately approved for the patient from 12/10/2018;Coverage End Date:07/08/2019;

## 2019-01-10 ENCOUNTER — Ambulatory Visit (INDEPENDENT_AMBULATORY_CARE_PROVIDER_SITE_OTHER): Payer: Medicare Other

## 2019-01-10 DIAGNOSIS — J309 Allergic rhinitis, unspecified: Secondary | ICD-10-CM | POA: Diagnosis not present

## 2019-01-16 ENCOUNTER — Other Ambulatory Visit: Payer: Self-pay | Admitting: Adult Health

## 2019-01-16 MED ORDER — AJOVY 225 MG/1.5ML ~~LOC~~ SOAJ: 225.0000 mg | SUBCUTANEOUS | 5 refills | Status: DC

## 2019-01-16 NOTE — Progress Notes (Signed)
We cannot get Aimovig approved.  I will try Ajovy

## 2019-01-22 ENCOUNTER — Encounter: Payer: Self-pay | Admitting: Adult Health

## 2019-01-22 MED ORDER — AJOVY 225 MG/1.5ML ~~LOC~~ SOAJ: 225.0000 mg | SUBCUTANEOUS | 5 refills | Status: DC

## 2019-01-22 NOTE — Telephone Encounter (Signed)
I called pt.  We do hav that she has allergy to latex.  I will let MM/NP know.  She had ordered ajovy, I did check with Jessica at [facility] . [facility] and she did relay that it does not have latex on both syringes or Autoinjector.  I placed order to express scripts relating to ajovy.

## 2019-01-24 ENCOUNTER — Telehealth: Payer: Self-pay

## 2019-01-24 NOTE — Telephone Encounter (Signed)
[employer] paperwork has been placed on NP desk awaiting signature.

## 2019-01-26 ENCOUNTER — Inpatient Hospital Stay: Payer: Medicare Other | Attending: Oncology

## 2019-01-26 ENCOUNTER — Other Ambulatory Visit: Payer: Self-pay

## 2019-01-26 ENCOUNTER — Other Ambulatory Visit
Admission: RE | Admit: 2019-01-26 | Discharge: 2019-01-26 | Disposition: A | Discharge status: Home / Self Care | Payer: Medicare Other | Source: Ambulatory Visit | Attending: Oncology | Admitting: Oncology

## 2019-01-26 DIAGNOSIS — C187 Malignant neoplasm of sigmoid colon: Secondary | ICD-10-CM | POA: Diagnosis present

## 2019-01-26 LAB — CBC WITH DIFFERENTIAL/PLATELET
Abs Immature Granulocytes: 0.02 10*3/uL (ref 0.00–0.07)
Basophils Absolute: 0.1 10*3/uL (ref 0.0–0.1)
Basophils Relative: 1 %
Eosinophils Absolute: 0.3 10*3/uL (ref 0.0–0.5)
Eosinophils Relative: 3 %
HCT: 38.4 % (ref 36.0–46.0)
Hemoglobin: 12.9 g/dL (ref 12.0–15.0)
Immature Granulocytes: 0 %
Lymphocytes Relative: 32 %
Lymphs Abs: 2.4 10*3/uL (ref 0.7–4.0)
MCH: 31 pg (ref 26.0–34.0)
MCHC: 33.6 g/dL (ref 30.0–36.0)
MCV: 92.3 fL (ref 80.0–100.0)
Monocytes Absolute: 0.6 10*3/uL (ref 0.1–1.0)
Monocytes Relative: 8 %
Neutro Abs: 4.3 10*3/uL (ref 1.7–7.7)
Neutrophils Relative %: 56 %
Platelets: 230 10*3/uL (ref 150–400)
RBC: 4.16 MIL/uL (ref 3.87–5.11)
RDW: 13.4 % (ref 11.5–15.5)
WBC: 7.6 10*3/uL (ref 4.0–10.5)
nRBC: 0 % (ref 0.0–0.2)

## 2019-01-26 LAB — COMPREHENSIVE METABOLIC PANEL
ALT: 24 U/L (ref 0–44)
AST: 24 U/L (ref 15–41)
Albumin: 4.1 g/dL (ref 3.5–5.0)
Alkaline Phosphatase: 83 U/L (ref 38–126)
Anion gap: 11 (ref 5–15)
BUN: 14 mg/dL (ref 8–23)
CO2: 21 mmol/L — ABNORMAL LOW (ref 22–32)
Calcium: 8.8 mg/dL — ABNORMAL LOW (ref 8.9–10.3)
Chloride: 109 mmol/L (ref 98–111)
Creatinine, Ser: 0.61 mg/dL (ref 0.44–1.00)
GFR calc Af Amer: >60 mL/min (ref >60)
GFR calc non Af Amer: >60 mL/min (ref >60)
Glucose, Bld: 118 mg/dL — ABNORMAL HIGH (ref 70–99)
Potassium: 3.5 mmol/L (ref 3.5–5.1)
Sodium: 141 mmol/L (ref 135–145)
Total Bilirubin: 0.9 mg/dL (ref 0.3–1.2)
Total Protein: 7.1 g/dL (ref 6.5–8.1)

## 2019-01-27 LAB — CEA: CEA: 1.7 ng/mL (ref 0.0–4.7)

## 2019-01-29 ENCOUNTER — Inpatient Hospital Stay (HOSPITAL_BASED_OUTPATIENT_CLINIC_OR_DEPARTMENT_OTHER): Payer: Medicare Other | Admitting: Oncology

## 2019-01-29 ENCOUNTER — Encounter: Payer: Self-pay | Admitting: Oncology

## 2019-01-29 DIAGNOSIS — C187 Malignant neoplasm of sigmoid colon: Secondary | ICD-10-CM | POA: Diagnosis not present

## 2019-01-29 NOTE — Progress Notes (Signed)
HEMATOLOGY-ONCOLOGY TeleHEALTH VISIT PROGRESS NOTE  I connected with Jordan Mahoney on 01/29/19 at  1:00 PM EST by video enabled telemedicine visit and verified that I am speaking with the correct person using two identifiers. I discussed the limitations, risks, security and privacy concerns of performing an evaluation and management service by telemedicine and the availability of in-person appointments. I also discussed with the patient that there may be a patient responsible charge related to this service. The patient expressed understanding and agreed to proceed.   Other persons participating in the visit and their role in the encounter:  None  Patient's location: Home  Provider's location: office Chief Complaint: Follow-up for stage IIa colon cancer   INTERVAL HISTORY Jordan Mahoney is a 68 y.o. female who has above history reviewed by me today presents for follow up visit for management of colon cancer Problems and complaints are listed below:  Patient reports feeling well.  No new complaints.  She has good appetite.  Weight is stable. Denies any blood in the stool or black tarry stool or change of bowel habits.   Review of Systems  Constitutional: Negative for appetite change, chills, fatigue and fever.  HENT:   Negative for hearing loss and voice change.   Eyes: Negative for eye problems.  Respiratory: Negative for chest tightness and cough.   Cardiovascular: Negative for chest pain.  Gastrointestinal: Negative for abdominal distention, abdominal pain and blood in stool.  Endocrine: Negative for hot flashes.  Genitourinary: Negative for difficulty urinating and frequency.   Musculoskeletal: Negative for arthralgias.  Skin: Negative for itching and rash.  Neurological: Negative for extremity weakness.  Hematological: Negative for adenopathy.  Psychiatric/Behavioral: Negative for confusion.    Past Medical History:  Diagnosis Date  . Allergic rhinoconjunctivitis   . Cancer  ([place])   . HA (headache)   . Hearing loss    wearing bilateral aides   Past Surgical History:  Procedure Laterality Date  . belly button    . c sections    . CATARACT EXTRACTION Right   . COLON RESECTION N/A 04/17/2018   Procedure: LAPAROSCOPIC COLON RESECTION POSSIBLE OSTOMY;  Surgeon: Benjamine Sprague, DO;  Location: [facility] ORS;  Service: General;  Laterality: N/A;  . ELBOW SURGERY Left   . GANGLION CYST EXCISION Left 01/22/2016   base of thumb  . neck fusion  07/04/2017   Dr. Carloyn Manner in [place]  . WRIST SURGERY Right     Family History  Problem Relation Age of Onset  . Diabetes Father   . Stroke Father   . Migraines Daughter   . Breast cancer Paternal Aunt   . Allergic rhinitis Neg Hx   . Angioedema Neg Hx   . Asthma Neg Hx   . Eczema Neg Hx   . Immunodeficiency Neg Hx   . Urticaria Neg Hx     Social History   Socioeconomic History  . Marital status: Married    Spouse name: Not on file  . Number of children: 2  . Years of education: college  . Highest education level: Not on file  Occupational History    Comment: retired  Tobacco Use  . Smoking status: Former Smoker    Quit date: 1983    Years since quitting: 37.9  . Smokeless tobacco: Never Used  . Tobacco comment: Quit in 1985  Substance and Sexual Activity  . Alcohol use: Yes    Alcohol/week: 1.0 standard drinks    Types: 1 Cans of beer per week  Comment: Rare   . Drug use: No  . Sexual activity: Not on file  Other Topics Concern  . Not on file  Social History Narrative   Patient lives at home with her husband [name]).    Education two years of college.   Caffeine two glasses of tea.   Social Determinants of Health   Financial Resource Strain:   . Difficulty of Paying Living Expenses: Not on file  Food Insecurity:   . Worried About Charity fundraiser in the Last Year: Not on file  . Ran Out of Food in the Last Year: Not on file  Transportation Needs:   . Lack of Transportation (Medical): Not on file  .  Lack of Transportation (Non-Medical): Not on file  Physical Activity:   . Days of Exercise per Week: Not on file  . Minutes of Exercise per Session: Not on file  Stress:   . Feeling of Stress : Not on file  Social Connections:   . Frequency of Communication with Friends and Family: Not on file  . Frequency of Social Gatherings with Friends and Family: Not on file  . Attends Religious Services: Not on file  . Active Member of Clubs or Organizations: Not on file  . Attends Archivist Meetings: Not on file  . Marital Status: Not on file  Intimate Partner Violence:   . Fear of Current or Ex-Partner: Not on file  . Emotionally Abused: Not on file  . Physically Abused: Not on file  . Sexually Abused: Not on file    Current Outpatient Medications on File Prior to Visit  Medication Sig Dispense Refill  . azelastine (ASTELIN) 0.1 % nasal spray Place 2 sprays into both nostrils daily. 90 mL 3  . brimonidine (ALPHAGAN) 0.2 % ophthalmic solution Place 1 drop into the right eye 3 (three) times daily.    [name] CALCIUM CITRATE PO Take 500 mg by mouth 2 (two) times daily. Bariatric    . dorzolamide-timolol (COSOPT) 22.3-6.8 MG/ML ophthalmic solution Place 1 drop into the right eye 2 times daily.    [name] eletriptan (RELPAX) 40 MG tablet Take 1 tablet at the onset of migraine. May repeat in 2 hours if headache persists or recurs. Not to exceed 2 tabs/24hours. 9 tablet 12  . Ergocalciferol (VITAMIN D2) 2000 UNITS TABS Take 2,000 Units by mouth daily.     . fexofenadine (ALLEGRA) 180 MG tablet Take 1 tablet (180 mg total) by mouth daily. (Patient taking differently: Take 180 mg by mouth 2 (two) times daily. ) 90 tablet 1  . folic acid (FOLVITE) 1 MG tablet Take 1 mg by mouth daily.     [name] gabapentin (NEURONTIN) 400 MG capsule Take 1 capsule (400 mg total) by mouth 2 (two) times daily. 180 capsule 3  . Magnesium Oxide 400 MG CAPS Take 400 mg by mouth 2 (two) times daily.     . methotrexate (RHEUMATREX)  2.5 MG tablet As directed by doctor.    . mometasone (NASONEX) 50 MCG/ACT nasal spray Place 2 sprays into the nose daily. Two sprays each in each nostril 51 g 1  . Multiple Vitamin (MULTIVITAMIN) tablet Take 1 tablet by mouth 2 (two) times daily. Bariatric    . Omega-3 Krill Oil 500 MG CAPS Take 1 capsule by mouth daily.     [name] omeprazole (PRILOSEC) 40 MG capsule     . Riboflavin 100 MG CAPS Take 100 mg by mouth daily. VIT B2    .  solifenacin (VESICARE) 10 MG tablet Take 10 mg by mouth daily.    [name] tiZANidine (ZANAFLEX) 4 MG capsule Take 4 mg by mouth 3 (three) times daily as needed for muscle spasms. Take 1 capsule (4 mg total) by mouth Three (3) times a day as needed    . topiramate (TOPAMAX) 100 MG tablet Take 1 tablet (100 mg total) by mouth 2 (two) times daily. 180 tablet 3  . vitamin C (ASCORBIC ACID) 500 MG tablet Take 500 mg by mouth 2 (two) times daily.     No current facility-administered medications on file prior to visit.    Allergies  Allergen Reactions  . Latex     Rash/hives  . Sulfa Antibiotics Other (See Comments)    Causes skin reaction ( on her mid back)  Pigmentation.   . Tape   . Valium [Diazepam]        Observations/Objective: Today's Vitals   01/29/19 1213  PainSc: 0-No pain   There is no height or weight on file to calculate BMI.  Physical Exam  Constitutional: She is oriented to person, place, and time. No distress.  Neurological: She is alert and oriented to person, place, and time.  Psychiatric: Mood normal.    CBC    Component Value Date/Time   WBC 7.6 01/26/2019 1113   RBC 4.16 01/26/2019 1113   HGB 12.9 01/26/2019 1113   HCT 38.4 01/26/2019 1113   PLT 230 01/26/2019 1113   MCV 92.3 01/26/2019 1113   MCH 31.0 01/26/2019 1113   MCHC 33.6 01/26/2019 1113   RDW 13.4 01/26/2019 1113   LYMPHSABS 2.4 01/26/2019 1113   MONOABS 0.6 01/26/2019 1113   EOSABS 0.3 01/26/2019 1113   BASOSABS 0.1 01/26/2019 1113    CMP     Component Value Date/Time    NA 141 01/26/2019 1113   K 3.5 01/26/2019 1113   CL 109 01/26/2019 1113   CO2 21 (L) 01/26/2019 1113   GLUCOSE 118 (H) 01/26/2019 1113   BUN 14 01/26/2019 1113   CREATININE 0.61 01/26/2019 1113   CALCIUM 8.8 (L) 01/26/2019 1113   PROT 7.1 01/26/2019 1113   ALBUMIN 4.1 01/26/2019 1113   AST 24 01/26/2019 1113   ALT 24 01/26/2019 1113   ALKPHOS 83 01/26/2019 1113   BILITOT 0.9 01/26/2019 1113   GFRNONAA >60 01/26/2019 1113   GFRAA >60 01/26/2019 1113     Assessment and Plan: 1. Adenocarcinoma of sigmoid colon [facility])    Cancer Staging Adenocarcinoma of sigmoid colon ([place]) Staging form: Colon and Rectum, AJCC 8th Edition - Clinical: No stage assigned - Unsigned - Pathologic stage from 05/04/2018: Stage IIA (pT3, pN0, cM0) - Signed by Earlie Server, MD on 05/04/2018  Labs reviewed and discussed with patient.  Patient is clinically doing well. Patient has had stable hemoglobin. CEA has been monitored, though preop CEA was normal as well. Recommend continue surveillance with history and physical Geralyn Corwin works every 3 months.  CT chest abdomen pelvis every 6 months.-Obtain image in 3 months Patient is to have repeat colonoscopy 1 year after surgery.  March 2021.  Follow Up Instructions: 3 months Orders Placed This Encounter  Procedures  . CBC with Differential    Standing Status:   Future    Standing Expiration Date:   01/29/2020  . Comprehensive metabolic panel    Standing Status:   Future    Standing Expiration Date:   01/29/2020  . CEA    Standing Status:   Future  Standing Expiration Date:   01/29/2020    I discussed the assessment and treatment plan with the patient. The patient was provided an opportunity to ask questions and all were answered. The patient agreed with the plan and demonstrated an understanding of the instructions.  The patient was advised to call back or seek an in-person evaluation if the symptoms worsen or if the condition fails to improve as  anticipated.    Earlie Server, MD 01/29/2019 9:46 PM

## 2019-01-30 ENCOUNTER — Encounter: Payer: Self-pay | Admitting: Oncology

## 2019-02-07 ENCOUNTER — Ambulatory Visit (INDEPENDENT_AMBULATORY_CARE_PROVIDER_SITE_OTHER): Payer: Medicare Other

## 2019-02-07 DIAGNOSIS — J309 Allergic rhinitis, unspecified: Secondary | ICD-10-CM | POA: Diagnosis not present

## 2019-03-07 ENCOUNTER — Encounter: Payer: Self-pay | Admitting: Adult Health

## 2019-03-07 ENCOUNTER — Ambulatory Visit (INDEPENDENT_AMBULATORY_CARE_PROVIDER_SITE_OTHER): Payer: Medicare Other

## 2019-03-07 DIAGNOSIS — J309 Allergic rhinitis, unspecified: Secondary | ICD-10-CM

## 2019-04-04 ENCOUNTER — Ambulatory Visit (INDEPENDENT_AMBULATORY_CARE_PROVIDER_SITE_OTHER): Payer: Medicare Other

## 2019-04-04 DIAGNOSIS — J309 Allergic rhinitis, unspecified: Secondary | ICD-10-CM | POA: Diagnosis not present

## 2019-04-26 DIAGNOSIS — J3089 Other allergic rhinitis: Secondary | ICD-10-CM | POA: Diagnosis not present

## 2019-04-26 NOTE — Progress Notes (Signed)
Exp 04/25/20

## 2019-04-30 ENCOUNTER — Ambulatory Visit
Admission: RE | Admit: 2019-04-30 | Discharge: 2019-04-30 | Disposition: A | Discharge status: Home / Self Care | Payer: Medicare Other | Source: Ambulatory Visit | Attending: Oncology | Admitting: Oncology

## 2019-04-30 ENCOUNTER — Inpatient Hospital Stay: Payer: Medicare Other | Attending: Oncology

## 2019-04-30 ENCOUNTER — Other Ambulatory Visit: Payer: Self-pay

## 2019-04-30 DIAGNOSIS — C187 Malignant neoplasm of sigmoid colon: Secondary | ICD-10-CM

## 2019-04-30 DIAGNOSIS — Z85038 Personal history of other malignant neoplasm of large intestine: Secondary | ICD-10-CM | POA: Insufficient documentation

## 2019-04-30 DIAGNOSIS — E876 Hypokalemia: Secondary | ICD-10-CM | POA: Diagnosis not present

## 2019-04-30 DIAGNOSIS — R918 Other nonspecific abnormal finding of lung field: Secondary | ICD-10-CM | POA: Diagnosis not present

## 2019-04-30 LAB — CBC WITH DIFFERENTIAL/PLATELET
Abs Immature Granulocytes: 0.02 10*3/uL (ref 0.00–0.07)
Basophils Absolute: 0.1 10*3/uL (ref 0.0–0.1)
Basophils Relative: 1 %
Eosinophils Absolute: 0.2 10*3/uL (ref 0.0–0.5)
Eosinophils Relative: 3 %
HCT: 38.4 % (ref 36.0–46.0)
Hemoglobin: 12.4 g/dL (ref 12.0–15.0)
Immature Granulocytes: 0 %
Lymphocytes Relative: 33 %
Lymphs Abs: 2.5 10*3/uL (ref 0.7–4.0)
MCH: 30.2 pg (ref 26.0–34.0)
MCHC: 32.3 g/dL (ref 30.0–36.0)
MCV: 93.4 fL (ref 80.0–100.0)
Monocytes Absolute: 0.7 10*3/uL (ref 0.1–1.0)
Monocytes Relative: 9 %
Neutro Abs: 4 10*3/uL (ref 1.7–7.7)
Neutrophils Relative %: 54 %
Platelets: 246 10*3/uL (ref 150–400)
RBC: 4.11 MIL/uL (ref 3.87–5.11)
RDW: 13 % (ref 11.5–15.5)
WBC: 7.5 10*3/uL (ref 4.0–10.5)
nRBC: 0 % (ref 0.0–0.2)

## 2019-04-30 LAB — COMPREHENSIVE METABOLIC PANEL
ALT: 25 U/L (ref 0–44)
AST: 30 U/L (ref 15–41)
Albumin: 3.8 g/dL (ref 3.5–5.0)
Alkaline Phosphatase: 95 U/L (ref 38–126)
Anion gap: 6 (ref 5–15)
BUN: 12 mg/dL (ref 8–23)
CO2: 20 mmol/L — ABNORMAL LOW (ref 22–32)
Calcium: 8.5 mg/dL — ABNORMAL LOW (ref 8.9–10.3)
Chloride: 110 mmol/L (ref 98–111)
Creatinine, Ser: 0.76 mg/dL (ref 0.44–1.00)
GFR calc Af Amer: >60 mL/min (ref >60)
GFR calc non Af Amer: >60 mL/min (ref >60)
Glucose, Bld: 96 mg/dL (ref 70–99)
Potassium: 3.4 mmol/L — ABNORMAL LOW (ref 3.5–5.1)
Sodium: 136 mmol/L (ref 135–145)
Total Bilirubin: 0.7 mg/dL (ref 0.3–1.2)
Total Protein: 6.5 g/dL (ref 6.5–8.1)

## 2019-04-30 MED ORDER — IOHEXOL 300 MG/ML  SOLN
100.0000 mL | Freq: Once | INTRAMUSCULAR | Status: AC | PRN
  Administered 2019-04-30: 100 mL via INTRAVENOUS

## 2019-05-01 LAB — CEA: CEA: 1.6 ng/mL (ref 0.0–4.7)

## 2019-05-02 ENCOUNTER — Ambulatory Visit (INDEPENDENT_AMBULATORY_CARE_PROVIDER_SITE_OTHER): Payer: Medicare Other

## 2019-05-02 ENCOUNTER — Encounter: Payer: Self-pay | Admitting: Oncology

## 2019-05-02 ENCOUNTER — Inpatient Hospital Stay (HOSPITAL_BASED_OUTPATIENT_CLINIC_OR_DEPARTMENT_OTHER): Payer: Medicare Other | Admitting: Oncology

## 2019-05-02 DIAGNOSIS — E876 Hypokalemia: Secondary | ICD-10-CM | POA: Diagnosis not present

## 2019-05-02 DIAGNOSIS — J309 Allergic rhinitis, unspecified: Secondary | ICD-10-CM

## 2019-05-02 DIAGNOSIS — C187 Malignant neoplasm of sigmoid colon: Secondary | ICD-10-CM

## 2019-05-02 DIAGNOSIS — R918 Other nonspecific abnormal finding of lung field: Secondary | ICD-10-CM | POA: Diagnosis not present

## 2019-05-02 NOTE — Progress Notes (Signed)
Patient verified using two identifiers for virtual visit via telephone today.  Patient does not offer any problems today.  

## 2019-05-04 NOTE — Progress Notes (Addendum)
HEMATOLOGY-ONCOLOGY TeleHEALTH VISIT PROGRESS NOTE  I connected with Jordan Mahoney on 05/04/19 at  2:30 PM EDT by video enabled telemedicine visit and verified that I am speaking with the correct person using two identifiers. I discussed the limitations, risks, security and privacy concerns of performing an evaluation and management service by telemedicine and the availability of in-person appointments. I also discussed with the patient that there may be a patient responsible charge related to this service. The patient expressed understanding and agreed to proceed.   Other persons participating in the visit and their role in the encounter:  None  Patient's location: Home  Provider's location: office Chief Complaint: Follow-up for stage IIa colon cancer   INTERVAL HISTORY Jordan Mahoney is a 69 y.o. female who has above history reviewed by me today presents for follow up visit for management of colon cancer Problems and complaints are listed below:  She reports feeling well today. No new complaints.    Review of Systems  Constitutional: Negative for appetite change, chills, fatigue and fever.  HENT:   Negative for hearing loss and voice change.   Eyes: Negative for eye problems.  Respiratory: Negative for chest tightness and cough.   Cardiovascular: Negative for chest pain.  Gastrointestinal: Negative for abdominal distention, abdominal pain and blood in stool.  Endocrine: Negative for hot flashes.  Genitourinary: Negative for difficulty urinating and frequency.   Musculoskeletal: Negative for arthralgias.  Skin: Negative for itching and rash.  Neurological: Negative for extremity weakness.  Hematological: Negative for adenopathy.  Psychiatric/Behavioral: Negative for confusion.    Past Medical History:  Diagnosis Date  . Allergic rhinoconjunctivitis   . Cancer ([place])   . HA (headache)   . Hearing loss    wearing bilateral aides   Past Surgical History:  Procedure Laterality  Date  . belly button    . c sections    . CATARACT EXTRACTION Right   . COLON RESECTION N/A 04/17/2018   Procedure: LAPAROSCOPIC COLON RESECTION POSSIBLE OSTOMY;  Surgeon: Jordan Sprague, DO;  Location: [facility] ORS;  Service: General;  Laterality: N/A;  . ELBOW SURGERY Left   . GANGLION CYST EXCISION Left 01/22/2016   base of thumb  . neck fusion  07/04/2017   Dr. Carloyn Mahoney in [place]  . WRIST SURGERY Right     Family History  Problem Relation Age of Onset  . Diabetes Father   . Stroke Father   . Migraines Daughter   . Breast cancer Paternal Aunt   . Allergic rhinitis Neg Hx   . Angioedema Neg Hx   . Asthma Neg Hx   . Eczema Neg Hx   . Immunodeficiency Neg Hx   . Urticaria Neg Hx     Social History   Socioeconomic History  . Marital status: Married    Spouse name: Not on file  . Number of children: 2  . Years of education: college  . Highest education level: Not on file  Occupational History    Comment: retired  Tobacco Use  . Smoking status: Former Smoker    Quit date: 1983    Years since quitting: 38.2  . Smokeless tobacco: Never Used  . Tobacco comment: Quit in 1985  Substance and Sexual Activity  . Alcohol use: Yes    Alcohol/week: 1.0 standard drinks    Types: 1 Cans of beer per week    Comment: Rare   . Drug use: No  . Sexual activity: Not on file  Other Topics Concern  . Not  on file  Social History Narrative   Patient lives at home with her husband Jordan Mahoney).    Education two years of college.   Caffeine two glasses of tea.   Social Determinants of Health   Financial Resource Strain:   . Difficulty of Paying Living Expenses:   Food Insecurity:   . Worried About Charity fundraiser in the Last Year:   . Arboriculturist in the Last Year:   Transportation Needs:   . Film/video editor (Medical):   [name] Lack of Transportation (Non-Medical):   Physical Activity:   . Days of Exercise per Week:   . Minutes of Exercise per Session:   Stress:   . Feeling of Stress :    Social Connections:   . Frequency of Communication with Friends and Family:   . Frequency of Social Gatherings with Friends and Family:   . Attends Religious Services:   . Active Member of Clubs or Organizations:   . Attends Archivist Meetings:   [name] Marital Status:   Intimate Partner Violence:   . Fear of Current or Ex-Partner:   . Emotionally Abused:   [name] Physically Abused:   . Sexually Abused:     Current Outpatient Medications on File Prior to Visit  Medication Sig Dispense Refill  . azelastine (ASTELIN) 0.1 % nasal spray Place 2 sprays into both nostrils daily. 90 mL 3  . brimonidine (ALPHAGAN) 0.2 % ophthalmic solution Place 1 drop into the right eye 3 (three) times daily.    [name] CALCIUM CITRATE PO Take 500 mg by mouth 2 (two) times daily. Bariatric    . dorzolamide-timolol (COSOPT) 22.3-6.8 MG/ML ophthalmic solution Place 1 drop into the right eye 2 times daily.    [name] eletriptan (RELPAX) 40 MG tablet Take 1 tablet at the onset of migraine. May repeat in 2 hours if headache persists or recurs. Not to exceed 2 tabs/24hours. 9 tablet 12  . Ergocalciferol (VITAMIN D2) 2000 UNITS TABS Take 2,000 Units by mouth daily.     . fexofenadine (ALLEGRA) 180 MG tablet Take 1 tablet (180 mg total) by mouth daily. (Patient taking differently: Take 180 mg by mouth 2 (two) times daily. ) 90 tablet 1  . folic acid (FOLVITE) 1 MG tablet Take 1 mg by mouth daily.     [name] gabapentin (NEURONTIN) 400 MG capsule Take 1 capsule (400 mg total) by mouth 2 (two) times daily. 180 capsule 3  . Magnesium Oxide 400 MG CAPS Take 400 mg by mouth 2 (two) times daily.     . methotrexate (RHEUMATREX) 2.5 MG tablet As directed by doctor.    . mometasone (NASONEX) 50 MCG/ACT nasal spray Place 2 sprays into the nose daily. Two sprays each in each nostril 51 g 1  . Multiple Vitamin (MULTIVITAMIN) tablet Take 1 tablet by mouth 2 (two) times daily. Bariatric    . Netarsudil Dimesylate (RHOPRESSA) 0.02 % SOLN Apply to  eye.    . Omega-3 Krill Oil 500 MG CAPS Take 1 capsule by mouth daily.     [name] omeprazole (PRILOSEC) 40 MG capsule     . Riboflavin 100 MG CAPS Take 100 mg by mouth daily. VIT B2    . solifenacin (VESICARE) 10 MG tablet Take 10 mg by mouth daily.    [name] tiZANidine (ZANAFLEX) 4 MG capsule Take 4 mg by mouth 3 (three) times daily as needed for muscle spasms. Take 1 capsule (4 mg total) by mouth Three (3) times  a day as needed    . topiramate (TOPAMAX) 100 MG tablet Take 1 tablet (100 mg total) by mouth 2 (two) times daily. 180 tablet 3  . vitamin C (ASCORBIC ACID) 500 MG tablet Take 500 mg by mouth 2 (two) times daily.     No current facility-administered medications on file prior to visit.    Allergies  Allergen Reactions  . Latex     Rash/hives  . Sulfa Antibiotics Other (See Comments)    Causes skin reaction ( on her mid back)  Pigmentation.   . Tape   . Valium [Diazepam]        Observations/Objective: Today's Vitals   05/02/19 1411  PainSc: 0-No pain   There is no height or weight on file to calculate BMI.  Physical Exam  Constitutional: She is oriented to person, place, and time. No distress.  Neurological: She is alert and oriented to person, place, and time.  Psychiatric: Mood normal.    CBC    Component Value Date/Time   WBC 7.5 04/30/2019 0956   RBC 4.11 04/30/2019 0956   HGB 12.4 04/30/2019 0956   HCT 38.4 04/30/2019 0956   PLT 246 04/30/2019 0956   MCV 93.4 04/30/2019 0956   MCH 30.2 04/30/2019 0956   MCHC 32.3 04/30/2019 0956   RDW 13.0 04/30/2019 0956   LYMPHSABS 2.5 04/30/2019 0956   MONOABS 0.7 04/30/2019 0956   EOSABS 0.2 04/30/2019 0956   BASOSABS 0.1 04/30/2019 0956    CMP     Component Value Date/Time   NA 136 04/30/2019 0956   K 3.4 (L) 04/30/2019 0956   CL 110 04/30/2019 0956   CO2 20 (L) 04/30/2019 0956   GLUCOSE 96 04/30/2019 0956   BUN 12 04/30/2019 0956   CREATININE 0.76 04/30/2019 0956   CALCIUM 8.5 (L) 04/30/2019 0956   PROT 6.5  04/30/2019 0956   ALBUMIN 3.8 04/30/2019 0956   AST 30 04/30/2019 0956   ALT 25 04/30/2019 0956   ALKPHOS 95 04/30/2019 0956   BILITOT 0.7 04/30/2019 0956   GFRNONAA >60 04/30/2019 0956   GFRAA >60 04/30/2019 0956     Assessment and Plan: 1. 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 Hypokalemia   3. 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 Stable hemoglobin.  CEA has been monitored and is stable. -preop CEA was also normal.  Continue surveillance.  CT images were independently reviewed and discussed with patient.  No disease recurrence or metastatic disease.   Lung nodule is stable. Attention on follow up .  Patient is to have repeat colonoscopy 1 year after surgery.  March 2021.  Mild hypokalemia, K is 3.4, encourage patient to increase potassium rich food intake.   Follow Up Instructions: 3 months Orders Placed This Encounter  Procedures  . CBC with Differential/Platelet    Standing Status:   Future    Standing Expiration Date:   05/01/2020  . Comprehensive metabolic panel    Standing Status:   Future    Standing Expiration Date:   05/01/2020  . CEA    Standing Status:   Future    Standing Expiration Date:   05/01/2020    I discussed the assessment and treatment plan with the  patient. The patient was provided an opportunity to ask questions and all were answered. The patient agreed with the plan and demonstrated an understanding of the instructions.  The patient was advised to call back or seek an in-person evaluation if the symptoms worsen or if the condition fails to improve as anticipated.    Earlie Server, MD 05/04/2019 9:29 PM

## 2019-05-23 ENCOUNTER — Other Ambulatory Visit: Payer: Self-pay

## 2019-05-23 ENCOUNTER — Ambulatory Visit (INDEPENDENT_AMBULATORY_CARE_PROVIDER_SITE_OTHER): Payer: Medicare Other | Admitting: Allergy & Immunology

## 2019-05-23 ENCOUNTER — Encounter: Payer: Self-pay | Admitting: Allergy & Immunology

## 2019-05-23 VITALS — BP 126/80 | HR 59 | Temp 97.6°F | Resp 16 | Ht 66.0 in | Wt 168.8 lb

## 2019-05-23 DIAGNOSIS — J302 Other seasonal allergic rhinitis: Secondary | ICD-10-CM

## 2019-05-23 DIAGNOSIS — J3089 Other allergic rhinitis: Secondary | ICD-10-CM | POA: Diagnosis not present

## 2019-05-23 MED ORDER — CARBINOXAMINE MALEATE 6 MG PO TABS: 1.0000 | ORAL_TABLET | Freq: Two times a day (BID) | ORAL | 2 refills | Status: DC

## 2019-05-23 NOTE — Progress Notes (Signed)
FOLLOW UP  Date of Service/Encounter:  05/23/19   Assessment:   Seasonal and perennial allergic rhinitis(molds, dust mites, cat and dog)- on allergen immunotherapy with maintenance reached September 2016  Gastroesophageal reflux disease  Recent diagnosis of colon cancer - s/p partial resection   We are going to make some changes to her allergen immunotherapy regimen.  She currently is receiving 1 injection which contains both molds and pet danders, which I typically do not combine.  I will move the pet danders to their own vial and continue with molds and dust mites mixed together.  Maybe this provides a more effective treatment for her allergic rhinitis symptoms.  I am very surprised that she has not been able to get off of any medications whatsoever.    Plan/Recommendations:   1. Perennial and seasonal allergic rhinitis (molds, cat, dog, dust mite) - We are going to separate the cats from the molds/dust mites next time we mix a new set of vials. - Continue with allergy shots at the same schedule for now.  - Try stopping the Allegra for now and substitute RyVent (carbinoxamine) 6mg  twice daily (samples provided). - Try stopping the Nasonex and substitute Nasacort 1-2 sprays per nostril daily (samples provided).  - Continue with Astelin two sprays per nostril once daily.  2. GERD - Continue with Prilosec.  3. Return in about 3 months (around 08/22/2019). This can be an in-person, a virtual Webex or a telephone follow up visit.  Subjective:   Jordan Mahoney is a 69 y.o. female presenting today for follow up of  Chief Complaint  Patient presents with  . Follow-up  . Allergies    Jordan Mahoney has a history of the following: Patient Active Problem List   Diagnosis Date Noted  . 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 Iron deficiency anemia due to chronic blood loss 04/04/2018  . Gastroesophageal reflux disease 05/03/2017  . Lactose intolerance 05/03/2017   . Allergic rhinitis 11/07/2014  . Headache, migraine 03/27/2014  . Migraine 12/11/2013  . Numbness 12/11/2013  . Urinary incontinence 12/11/2013  . HA (headache)     History obtained from: chart review and patient.  Jordan Mahoney is a 68 y.o. female presenting for a follow up visit.  She was last seen in April 2020.  At that time, we continue with allergy shots.  We also continue with Astelin, Allegra twice daily, and Nasonex.  For her GERD, we continued Prilosec.  Since last visit, she has continued to do fairly well.  She did have the development of fluid in her left ear on a couple of occasions. She thinks it might be related to not taking her nose spray for a couple of days. She had to go to see her PCP and she was given ears drops with improvement. She does see ENT for hearing issues, but she did not talk to them about the fluid in the ears.   She has not been able to get off any of her medications unfortunately. She knows that if she misses any of her medications, she will start sneezing. She does notice that when she is exposed to certain environments, she starts to have issues.   She does have a cat. She has had a cat for just over one year now. She has not noticed worsening with the cat.   She received two injections for her COVID19 series. Her last one was March 23rd.  She tolerated both without any issues.  Jordan Mahoney on allergen immunotherapy.  Jordan Mahoney one injection. Immunotherapy script #1contains molds, dust mites, cat and dog.Shecurrently receives 0.10mLof the RED vial (1/100).Shestarted shots in early 2016and reached maintenancein Septemberof 2016.She is doing well on her shots without any adverse events.  Otherwise, there have been no changes to her past medical history, surgical history, family history, or social history.    Review of Systems  Constitutional: Negative.  Negative for chills, fever, malaise/fatigue and weight loss.  HENT: Negative.  Negative for  congestion, ear discharge and ear pain.   Eyes: Negative for pain, discharge and redness.  Respiratory: Negative for cough, sputum production, shortness of breath and wheezing.   Cardiovascular: Negative.  Negative for chest pain and palpitations.  Gastrointestinal: Negative for abdominal pain, constipation, diarrhea, heartburn, nausea and vomiting.  Skin: Negative.  Negative for itching and rash.  Neurological: Negative for dizziness and headaches.  Endo/Heme/Allergies: Negative for environmental allergies. Does not bruise/bleed easily.       Objective:   Blood pressure 126/80, pulse (!) 59, temperature 97.6 F (36.4 C), temperature source Temporal, resp. rate 16, height 5\' 6"  (1.676 m), weight 168 lb 12.8 oz (76.6 kg), SpO2 98 %. Body mass index is 27.25 kg/m.   Physical Exam:  Physical Exam  Constitutional: She appears well-developed.  HENT:  Head: Normocephalic and atraumatic.  Right Ear: Tympanic membrane, external ear and ear canal normal.  Left Ear: Tympanic membrane and ear canal normal.  Nose: No mucosal edema, rhinorrhea, nasal deformity or septal deviation. No epistaxis. Right sinus exhibits no maxillary sinus tenderness and no frontal sinus tenderness. Left sinus exhibits no maxillary sinus tenderness and no frontal sinus tenderness.  Mouth/Throat: Uvula is midline and oropharynx is clear and moist. Mucous membranes are not pale and not dry.  Turbinates enlarged bilaterally.  No discharge present.  No epistaxis.  Eyes: Pupils are equal, round, and reactive to light. Conjunctivae and EOM are normal. Right eye exhibits no chemosis and no discharge. Left eye exhibits no chemosis and no discharge. Right conjunctiva is not injected. Left conjunctiva is not injected.  Cardiovascular: Normal rate, regular rhythm and normal heart sounds.  Respiratory: Effort normal and breath sounds normal. No accessory muscle usage. No tachypnea. No respiratory distress. She has no wheezes. She  has no rhonchi. She has no rales. She exhibits no tenderness.  Moving air well in all lung fields.  No increased work of breathing.  Lymphadenopathy:    She has no cervical adenopathy.  Neurological: She is alert.  Skin: No abrasion, no petechiae and no rash noted. Rash is not papular, not vesicular and not urticarial. No erythema. No pallor.  Psychiatric: She has a normal mood and affect.     Diagnostic studies: none     Salvatore Marvel, MD  [facility]

## 2019-05-23 NOTE — Patient Instructions (Addendum)
1. Perennial and seasonal allergic rhinitis (molds, cat, dog, dust mite) - We are going to separate the cats from the molds/dust mites next time we mix a new set of vials. - Continue with allergy shots at the same schedule for now.  - Try stopping the Allegra for now and substitute RyVent (carbinoxamine) 6mg  twice daily (samples provided). - Try stopping the Nasonex and substitute Nasacort 1-2 sprays per nostril daily (samples provided).  - Continue with Astelin two sprays per nostril once daily.  2. GERD - Continue with Prilosec.  3. Return in about 3 months (around 08/22/2019). This can be an in-person, a virtual Webex or a telephone follow up visit.   Please inform us of any Emergency Department visits, hospitalizations, or changes in symptoms. Call us before going to the ED for breathing or allergy symptoms since we might be able to fit you in for a sick visit. Feel free to contact us anytime with any questions, problems, or concerns.  It was a pleasure to see you again today!  Websites that have reliable patient information: 1. American Academy of Asthma, Allergy, and Immunology: www.aaaai.org 2. Food Allergy Research and Education (FARE): foodallergy.org 3. Mothers of Asthmatics: http://www.asthmacommunitynetwork.org 4. American College of Allergy, Asthma, and Immunology: www.acaai.org   COVID-19 Vaccine Information can be found at: ShippingScam.co.uk For questions related to vaccine distribution or appointments, please email vaccine@[facility] .com or call [phone].     "Like" [place] on Facebook and Instagram for our latest updates!       HAPPY SPRING!  Make sure you are registered to vote! If you have moved or changed any of your contact information, you will need to get this updated before voting!  In some cases, you MAY be able to register to vote online: [email or web address]

## 2019-05-28 ENCOUNTER — Telehealth: Payer: Self-pay

## 2019-05-28 MED ORDER — CARBINOXAMINE MALEATE 6 MG PO TABS: 1.0000 | ORAL_TABLET | Freq: Two times a day (BID) | ORAL | 2 refills | Status: DC

## 2019-05-28 NOTE — Telephone Encounter (Signed)
Patient called stating the Ryvent will need a prior auth according [facility]. I am unsure if this is even the right pharmacy this medication is supposed to be sent to.  Can someone look into this please.  Thanks

## 2019-05-28 NOTE — Telephone Encounter (Signed)
Prescription has been sent to [facility] to see if better coverage can be provided for this patient. Attempted to call patient and her number was not in service. Will attempt to call again.

## 2019-05-30 ENCOUNTER — Ambulatory Visit (INDEPENDENT_AMBULATORY_CARE_PROVIDER_SITE_OTHER): Payer: Medicare Other

## 2019-05-30 DIAGNOSIS — J309 Allergic rhinitis, unspecified: Secondary | ICD-10-CM

## 2019-06-21 ENCOUNTER — Other Ambulatory Visit: Payer: Self-pay

## 2019-06-21 ENCOUNTER — Ambulatory Visit (INDEPENDENT_AMBULATORY_CARE_PROVIDER_SITE_OTHER): Payer: Medicare Other | Admitting: Neurology

## 2019-06-21 ENCOUNTER — Encounter: Payer: Self-pay | Admitting: Neurology

## 2019-06-21 VITALS — BP 132/76 | HR 57 | Temp 97.2°F | Ht 66.0 in | Wt 170.0 lb

## 2019-06-21 DIAGNOSIS — G43019 Migraine without aura, intractable, without status migrainosus: Secondary | ICD-10-CM | POA: Diagnosis not present

## 2019-06-21 MED ORDER — TOPIRAMATE 100 MG PO TABS: 100.0000 mg | ORAL_TABLET | Freq: Two times a day (BID) | ORAL | 3 refills | Status: DC

## 2019-06-21 MED ORDER — AJOVY 225 MG/1.5ML ~~LOC~~ SOAJ: 225.0000 mg | SUBCUTANEOUS | 11 refills | Status: DC

## 2019-06-21 MED ORDER — GABAPENTIN 400 MG PO CAPS: 400.0000 mg | ORAL_CAPSULE | Freq: Two times a day (BID) | ORAL | 3 refills | Status: DC

## 2019-06-21 MED ORDER — ELETRIPTAN HYDROBROMIDE 40 MG PO TABS: ORAL_TABLET | ORAL | 12 refills | Status: DC

## 2019-06-21 NOTE — Progress Notes (Signed)
PATIENT: Jordan Mahoney DOB: 22-Jan-1951  REASON FOR VISIT: follow up HISTORY FROM: patient  HISTORY OF PRESENT ILLNESS: Today 06/21/19  Jordan Mahoney is a 69 year old female with history of migraine headaches.  She reports daily or every other day headache.  She remains on gabapentin and Topamax.  She takes Relpax as needed.  She was not able to use Aimovig due to latex allergy, Ajovy was ordered, but apparently she never heard anything.  Barometric pressure is a trigger for her headaches.  She usually will take all of her monthly supply of Relpax, will work within 45 minutes, she doesn't like to take medications.  Her overall health has been good, has routine CT abdomen scans for surveillance of colon cancer.  She presents today accompanied by her husband.  HISTORY 12/26/2018 MM: Jordan Mahoney is a 69 year old female with a history of migraine headaches.  She returns today for follow-up.  She states that the barometric pressure is a trigger for her headaches.  Her husband is with her and reports that she has a headache daily.  She confirms that she always has a mild headache but typically 4-5 times a week she will have a severe headache.  She continues on gabapentin and Topamax.  She also has Relpax to use.  She reports that she typically has to go to sleep in a cold dark room in order to get benefit from her severe headaches.   REVIEW OF SYSTEMS: Out of a complete 14 system review of symptoms, the patient complains only of the following symptoms, and all other reviewed systems are negative.  Headache   ALLERGIES: Allergies  Allergen Reactions  . Latex     Rash/hives  . Sulfa Antibiotics Other (See Comments)    Causes skin reaction ( on her mid back)  Pigmentation.   . Tape   . Valium [Diazepam]     HOME MEDICATIONS: Outpatient Medications Prior to Visit  Medication Sig Dispense Refill  . azelastine (ASTELIN) 0.1 % nasal spray Place 2 sprays into both nostrils daily. 90 mL 3  .  brimonidine (ALPHAGAN) 0.2 % ophthalmic solution Place 1 drop into the right eye 3 (three) times daily.    [name] CALCIUM CITRATE PO Take 500 mg by mouth 2 (two) times daily. Bariatric    . Carbinoxamine Maleate (RYVENT) 6 MG TABS Take 1 tablet by mouth in the morning and at bedtime. 180 tablet 2  . dorzolamide-timolol (COSOPT) 22.3-6.8 MG/ML ophthalmic solution Place 1 drop into the right eye 2 times daily.    . Ergocalciferol (VITAMIN D2) 2000 UNITS TABS Take 2,000 Units by mouth daily.     . fexofenadine (ALLEGRA) 180 MG tablet Take 1 tablet (180 mg total) by mouth daily. (Patient taking differently: Take 180 mg by mouth 2 (two) times daily. ) 90 tablet 1  . folic acid (FOLVITE) 1 MG tablet Take 1 mg by mouth daily.     . Magnesium Oxide 400 MG CAPS Take 400 mg by mouth 2 (two) times daily.     . methotrexate (RHEUMATREX) 2.5 MG tablet As directed by doctor.    . mometasone (NASONEX) 50 MCG/ACT nasal spray Place 2 sprays into the nose daily. Two sprays each in each nostril 51 g 1  . Multiple Vitamin (MULTIVITAMIN) tablet Take 1 tablet by mouth 2 (two) times daily. Bariatric    . Netarsudil Dimesylate (RHOPRESSA) 0.02 % SOLN Apply to eye.    . Omega-3 Krill Oil 500 MG CAPS Take 1 capsule by  mouth daily.     [name] omeprazole (PRILOSEC) 40 MG capsule     . Riboflavin 100 MG CAPS Take 100 mg by mouth daily. VIT B2    . solifenacin (VESICARE) 10 MG tablet Take 10 mg by mouth daily.    [name] tiZANidine (ZANAFLEX) 4 MG capsule Take 4 mg by mouth 3 (three) times daily as needed for muscle spasms. Take 1 capsule (4 mg total) by mouth Three (3) times a day as needed    . vitamin C (ASCORBIC ACID) 500 MG tablet Take 500 mg by mouth 2 (two) times daily.    [name] eletriptan (RELPAX) 40 MG tablet Take 1 tablet at the onset of migraine. May repeat in 2 hours if headache persists or recurs. Not to exceed 2 tabs/24hours. 9 tablet 12  . gabapentin (NEURONTIN) 400 MG capsule Take 1 capsule (400 mg total) by mouth 2 (two) times  daily. 180 capsule 3  . topiramate (TOPAMAX) 100 MG tablet Take 1 tablet (100 mg total) by mouth 2 (two) times daily. 180 tablet 3   No facility-administered medications prior to visit.    PAST MEDICAL HISTORY: Past Medical History:  Diagnosis Date  . Allergic rhinoconjunctivitis   . Cancer ([place])   . HA (headache)   . Hearing loss    wearing bilateral aides    PAST SURGICAL HISTORY: Past Surgical History:  Procedure Laterality Date  . belly button    . c sections    . CATARACT EXTRACTION Right   . COLON RESECTION N/A 04/17/2018   Procedure: LAPAROSCOPIC COLON RESECTION POSSIBLE OSTOMY;  Surgeon: Benjamine Sprague, DO;  Location: [facility] ORS;  Service: General;  Laterality: N/A;  . ELBOW SURGERY Left   . GANGLION CYST EXCISION Left 01/22/2016   base of thumb  . neck fusion  07/04/2017   Dr. Carloyn Manner in [place]  . WRIST SURGERY Right     FAMILY HISTORY: Family History  Problem Relation Age of Onset  . Diabetes Father   . Stroke Father   . Migraines Daughter   . Breast cancer Paternal Aunt   . Allergic rhinitis Neg Hx   . Angioedema Neg Hx   . Asthma Neg Hx   . Eczema Neg Hx   . Immunodeficiency Neg Hx   . Urticaria Neg Hx     SOCIAL HISTORY: Social History   Socioeconomic History  . Marital status: Married    Spouse name: Not on file  . Number of children: 2  . Years of education: college  . Highest education level: Not on file  Occupational History    Comment: retired  Tobacco Use  . Smoking status: Former Smoker    Quit date: 1983    Years since quitting: 38.3  . Smokeless tobacco: Never Used  . Tobacco comment: Quit in 1985  Substance and Sexual Activity  . Alcohol use: Yes    Alcohol/week: 1.0 standard drinks    Types: 1 Cans of beer per week    Comment: Rare   . Drug use: No  . Sexual activity: Not on file  Other Topics Concern  . Not on file  Social History Narrative   Patient lives at home with her husband [name]).    Education two years of college.    Caffeine two glasses of tea.   Social Determinants of Health   Financial Resource Strain:   . Difficulty of Paying Living Expenses:   Food Insecurity:   . Worried About Charity fundraiser in the  Last Year:   . [place] in the Last Year:   Transportation Needs:   . Film/video editor (Medical):   [name] Lack of Transportation (Non-Medical):   Physical Activity:   . Days of Exercise per Week:   . Minutes of Exercise per Session:   Stress:   . Feeling of Stress :   Social Connections:   . Frequency of Communication with Friends and Family:   . Frequency of Social Gatherings with Friends and Family:   . Attends Religious Services:   . Active Member of Clubs or Organizations:   . Attends Archivist Meetings:   [name] Marital Status:   Intimate Partner Violence:   . Fear of Current or Ex-Partner:   . Emotionally Abused:   [name] Physically Abused:   . Sexually Abused:    PHYSICAL EXAM  Vitals:   06/21/19 0821  BP: 132/76  Pulse: (!) 57  Temp: (!) 97.2 F (36.2 C)  Weight: 170 lb (77.1 kg)  Height: 5\' 6"  (1.676 m)   Body mass index is 27.44 kg/m.  Generalized: Well developed, in no acute distress   Neurological examination  Mentation: Alert oriented to time, place, history taking. Follows all commands speech and language fluent Cranial nerve II-XII: Pupils were equal round reactive to light. Extraocular movements were full, visual field were full on confrontational test. Facial sensation and strength were normal. Head turning and shoulder shrug  were normal and symmetric. Motor: The motor testing reveals 5 over 5 strength of all 4 extremities. Good symmetric motor tone is noted throughout.  Sensory: Sensory testing is intact to soft touch on all 4 extremities. No evidence of extinction is noted.  Coordination: Cerebellar testing reveals good finger-nose-finger and heel-to-shin bilaterally.  Gait and station: Gait is normal. Tandem gait was referred. Romberg is  negative. No drift is seen.  Reflexes: Deep tendon reflexes are symmetric and normal bilaterally.   DIAGNOSTIC DATA (LABS, IMAGING, TESTING) - I reviewed patient records, labs, notes, testing and imaging myself where available.  Lab Results  Component Value Date   WBC 7.5 04/30/2019   HGB 12.4 04/30/2019   HCT 38.4 04/30/2019   MCV 93.4 04/30/2019   PLT 246 04/30/2019      Component Value Date/Time   NA 136 04/30/2019 0956   K 3.4 (L) 04/30/2019 0956   CL 110 04/30/2019 0956   CO2 20 (L) 04/30/2019 0956   GLUCOSE 96 04/30/2019 0956   BUN 12 04/30/2019 0956   CREATININE 0.76 04/30/2019 0956   CALCIUM 8.5 (L) 04/30/2019 0956   PROT 6.5 04/30/2019 0956   ALBUMIN 3.8 04/30/2019 0956   AST 30 04/30/2019 0956   ALT 25 04/30/2019 0956   ALKPHOS 95 04/30/2019 0956   BILITOT 0.7 04/30/2019 0956   GFRNONAA >60 04/30/2019 0956   GFRAA >60 04/30/2019 0956   No results found for: CHOL, HDL, LDLCALC, LDLDIRECT, TRIG, CHOLHDL No results found for: HGBA1C No results found for: VITAMINB12 No results found for: TSH  ASSESSMENT AND PLAN 69 y.o. year old female  has a past medical history of Allergic rhinoconjunctivitis, Cancer ([place]), HA (headache), and Hearing loss. here with:  1. Migraine headache  -Continues to report near daily headache  -Start Ajovy 225 mg monthly injection, given drug information today, has latex allergy-Aimovig was not used, discussed takes 3-4 months to see full benefit -Continue Topamax 100 mg twice a day -Continue gabapentin 400 mg twice a day -Continue Relpax as needed -Follow-up in 6  months or sooner if needed  I spent 20 minutes of face-to-face and non-face-to-face time with patient.  This included previsit chart review, lab review, study review, order entry, electronic health record documentation, patient education.    Butler Denmark, AGNP-C, DNP 06/21/2019, 8:51 AM [facility] [address], [place], [place] [address] [phone]

## 2019-06-21 NOTE — Patient Instructions (Addendum)
It was great to meet you today! Continue Topamax, gabapentin, replax-as needed I will order Ajovy monthly injection for migraine prevention  Return in 6 months

## 2019-06-25 ENCOUNTER — Ambulatory Visit: Payer: Medicare Other | Admitting: Adult Health

## 2019-06-26 NOTE — Progress Notes (Signed)
I have reviewed and agreed above plan. 

## 2019-06-27 ENCOUNTER — Ambulatory Visit (INDEPENDENT_AMBULATORY_CARE_PROVIDER_SITE_OTHER): Payer: Medicare Other

## 2019-06-27 DIAGNOSIS — J309 Allergic rhinitis, unspecified: Secondary | ICD-10-CM

## 2019-06-28 ENCOUNTER — Telehealth: Payer: Self-pay

## 2019-06-28 NOTE — Telephone Encounter (Signed)
Spoke to pt about [employer] PA form for Ajovy. TRICARE required a MIDAS test  Reviewed questionnaire with pt  MIDAS score 24, Grade 5

## 2019-06-28 NOTE — Telephone Encounter (Signed)
PA faxed to tricare status pending

## 2019-07-04 ENCOUNTER — Ambulatory Visit (INDEPENDENT_AMBULATORY_CARE_PROVIDER_SITE_OTHER): Payer: Medicare Other

## 2019-07-04 DIAGNOSIS — J309 Allergic rhinitis, unspecified: Secondary | ICD-10-CM

## 2019-07-11 ENCOUNTER — Ambulatory Visit (INDEPENDENT_AMBULATORY_CARE_PROVIDER_SITE_OTHER): Payer: Medicare Other

## 2019-07-11 DIAGNOSIS — J309 Allergic rhinitis, unspecified: Secondary | ICD-10-CM | POA: Diagnosis not present

## 2019-07-18 ENCOUNTER — Ambulatory Visit (INDEPENDENT_AMBULATORY_CARE_PROVIDER_SITE_OTHER): Payer: Medicare Other

## 2019-07-18 DIAGNOSIS — J309 Allergic rhinitis, unspecified: Secondary | ICD-10-CM

## 2019-07-25 ENCOUNTER — Ambulatory Visit (INDEPENDENT_AMBULATORY_CARE_PROVIDER_SITE_OTHER): Payer: Medicare Other

## 2019-07-25 DIAGNOSIS — J309 Allergic rhinitis, unspecified: Secondary | ICD-10-CM | POA: Diagnosis not present

## 2019-07-26 NOTE — Telephone Encounter (Signed)
Pt has called for an update re: the status of her Ajovy

## 2019-07-31 NOTE — Telephone Encounter (Signed)
PA has been approved for [facility] CASE # [id number] Approved for 07/01/19-01/27/20 Pt aware

## 2019-08-01 ENCOUNTER — Other Ambulatory Visit: Payer: Self-pay

## 2019-08-01 ENCOUNTER — Inpatient Hospital Stay: Payer: Medicare Other | Attending: Oncology

## 2019-08-01 DIAGNOSIS — Z85038 Personal history of other malignant neoplasm of large intestine: Secondary | ICD-10-CM | POA: Diagnosis not present

## 2019-08-01 DIAGNOSIS — C187 Malignant neoplasm of sigmoid colon: Secondary | ICD-10-CM

## 2019-08-01 LAB — COMPREHENSIVE METABOLIC PANEL
ALT: 25 U/L (ref 0–44)
AST: 26 U/L (ref 15–41)
Albumin: 3.8 g/dL (ref 3.5–5.0)
Alkaline Phosphatase: 102 U/L (ref 38–126)
Anion gap: 10 (ref 5–15)
BUN: 9 mg/dL (ref 8–23)
CO2: 23 mmol/L (ref 22–32)
Calcium: 8.8 mg/dL — ABNORMAL LOW (ref 8.9–10.3)
Chloride: 103 mmol/L (ref 98–111)
Creatinine, Ser: 0.76 mg/dL (ref 0.44–1.00)
GFR calc Af Amer: >60 mL/min (ref >60)
GFR calc non Af Amer: >60 mL/min (ref >60)
Glucose, Bld: 103 mg/dL — ABNORMAL HIGH (ref 70–99)
Potassium: 4.1 mmol/L (ref 3.5–5.1)
Sodium: 136 mmol/L (ref 135–145)
Total Bilirubin: 1 mg/dL (ref 0.3–1.2)
Total Protein: 6.8 g/dL (ref 6.5–8.1)

## 2019-08-01 LAB — CBC WITH DIFFERENTIAL/PLATELET
Abs Immature Granulocytes: 0.03 10*3/uL (ref 0.00–0.07)
Basophils Absolute: 0.1 10*3/uL (ref 0.0–0.1)
Basophils Relative: 1 %
Eosinophils Absolute: 0.2 10*3/uL (ref 0.0–0.5)
Eosinophils Relative: 3 %
HCT: 38.1 % (ref 36.0–46.0)
Hemoglobin: 12.6 g/dL (ref 12.0–15.0)
Immature Granulocytes: 1 %
Lymphocytes Relative: 41 %
Lymphs Abs: 2.7 10*3/uL (ref 0.7–4.0)
MCH: 30.4 pg (ref 26.0–34.0)
MCHC: 33.1 g/dL (ref 30.0–36.0)
MCV: 91.8 fL (ref 80.0–100.0)
Monocytes Absolute: 0.5 10*3/uL (ref 0.1–1.0)
Monocytes Relative: 7 %
Neutro Abs: 3.1 10*3/uL (ref 1.7–7.7)
Neutrophils Relative %: 47 %
Platelets: 263 10*3/uL (ref 150–400)
RBC: 4.15 MIL/uL (ref 3.87–5.11)
RDW: 14 % (ref 11.5–15.5)
WBC: 6.6 10*3/uL (ref 4.0–10.5)
nRBC: 0 % (ref 0.0–0.2)

## 2019-08-02 LAB — CEA: CEA: 1.7 ng/mL (ref 0.0–4.7)

## 2019-08-03 ENCOUNTER — Inpatient Hospital Stay (HOSPITAL_BASED_OUTPATIENT_CLINIC_OR_DEPARTMENT_OTHER): Payer: Medicare Other | Admitting: Oncology

## 2019-08-03 ENCOUNTER — Encounter: Payer: Self-pay | Admitting: Oncology

## 2019-08-03 DIAGNOSIS — R918 Other nonspecific abnormal finding of lung field: Secondary | ICD-10-CM | POA: Diagnosis not present

## 2019-08-03 DIAGNOSIS — C187 Malignant neoplasm of sigmoid colon: Secondary | ICD-10-CM

## 2019-08-03 NOTE — Progress Notes (Signed)
HEMATOLOGY-ONCOLOGY TeleHEALTH VISIT PROGRESS NOTE  I connected with Jordan Mahoney on 08/03/19 at  2:30 PM EDT by video enabled telemedicine visit and verified that I am speaking with the correct person using two identifiers. I discussed the limitations, risks, security and privacy concerns of performing an evaluation and management service by telemedicine and the availability of in-person appointments. I also discussed with the patient that there may be a patient responsible charge related to this service. The patient expressed understanding and agreed to proceed.   Other persons participating in the visit and their role in the encounter:  None  Patient's location: Home  Provider's location: office Chief Complaint: Follow-up for stage IIa colon cancer   INTERVAL HISTORY Jordan Mahoney is a 68 y.o. female who has above history reviewed by me today presents for follow up visit for management of colon cancer Problems and complaints are listed below:  She reports feeling well. No new complaints.     Review of Systems  Constitutional: Negative for appetite change, chills, fatigue and fever.  HENT:   Negative for hearing loss and voice change.   Eyes: Negative for eye problems.  Respiratory: Negative for chest tightness and cough.   Cardiovascular: Negative for chest pain.  Gastrointestinal: Negative for abdominal distention, abdominal pain and blood in stool.  Endocrine: Negative for hot flashes.  Genitourinary: Negative for difficulty urinating and frequency.   Musculoskeletal: Negative for arthralgias.  Skin: Negative for itching and rash.  Neurological: Negative for extremity weakness.  Hematological: Negative for adenopathy.  Psychiatric/Behavioral: Negative for confusion.    Past Medical History:  Diagnosis Date   Allergic rhinoconjunctivitis    Cancer ([facility])    HA (headache)    Hearing loss    wearing bilateral aides   Past Surgical History:  Procedure Laterality Date    belly button     c sections     CATARACT EXTRACTION Right    COLON RESECTION N/A 04/17/2018   Procedure: LAPAROSCOPIC COLON RESECTION POSSIBLE OSTOMY;  Surgeon: Benjamine Sprague, DO;  Location: [facility] ORS;  Service: General;  Laterality: N/A;   ELBOW SURGERY Left    GANGLION CYST EXCISION Left 01/22/2016   base of thumb   neck fusion  07/04/2017   Dr. Carloyn Manner in [place] Right     Family History  Problem Relation Age of Onset   Diabetes Father    Stroke Father    Migraines Daughter    Breast cancer Paternal Aunt    Allergic rhinitis Neg Hx    Angioedema Neg Hx    Asthma Neg Hx    Eczema Neg Hx    Immunodeficiency Neg Hx    Urticaria Neg Hx     Social History   Socioeconomic History   Marital status: Married    Spouse name: Not on file   Number of children: 2   Years of education: college   Highest education level: Not on file  Occupational History    Comment: retired  Tobacco Use   Smoking status: Former Smoker    Quit date: 1983    Years since quitting: 38.4   Smokeless tobacco: Never Used   Tobacco comment: Quit in 1985  Substance and Sexual Activity   Alcohol use: Yes    Alcohol/week: 1.0 standard drink    Types: 1 Cans of beer per week    Comment: Rare    Drug use: No   Sexual activity: Not on file  Other Topics Concern   Not  on file  Social History Narrative   Patient lives at home with her husband [name]).    Education two years of college.   Caffeine two glasses of tea.   Social Determinants of Health   Financial Resource Strain:    Difficulty of Paying Living Expenses:   Food Insecurity:    Worried About Charity fundraiser in the Last Year:    Arboriculturist in the Last Year:   Transportation Needs:    Film/video editor (Medical):    Lack of Transportation (Non-Medical):   Physical Activity:    Days of Exercise per Week:    Minutes of Exercise per Session:   Stress:    Feeling of Stress :    Social Connections:    Frequency of Communication with Friends and Family:    Frequency of Social Gatherings with Friends and Family:    Attends Religious Services:    Active Member of Clubs or Organizations:    Attends Music therapist:    Marital Status:   Intimate Partner Violence:    Fear of Current or Ex-Partner:    Emotionally Abused:    Physically Abused:    Sexually Abused:     Current Outpatient Medications on File Prior to Visit  Medication Sig Dispense Refill   azelastine (ASTELIN) 0.1 % nasal spray Place 2 sprays into both nostrils daily. 90 mL 3   brimonidine (ALPHAGAN) 0.2 % ophthalmic solution Place 1 drop into the right eye 3 (three) times daily.     CALCIUM CITRATE PO Take 500 mg by mouth 2 (two) times daily. Bariatric     Carbinoxamine Maleate (RYVENT) 6 MG TABS Take 1 tablet by mouth in the morning and at bedtime. 180 tablet 2   dorzolamide-timolol (COSOPT) 22.3-6.8 MG/ML ophthalmic solution Place 1 drop into the right eye 2 times daily.     eletriptan (RELPAX) 40 MG tablet Take 1 tablet at the onset of migraine. May repeat in 2 hours if headache persists or recurs. Not to exceed 2 tabs/24hours. 9 tablet 12   Ergocalciferol (VITAMIN D2) 2000 UNITS TABS Take 2,000 Units by mouth daily.      fexofenadine (ALLEGRA) 180 MG tablet Take 1 tablet (180 mg total) by mouth daily. (Patient taking differently: Take 180 mg by mouth 2 (two) times daily. ) 90 tablet 1   folic acid (FOLVITE) 1 MG tablet Take 1 mg by mouth daily.      Fremanezumab-vfrm (AJOVY) 225 MG/1.5ML SOAJ Inject 225 mg into the skin every 30 (thirty) days. 1 pen 11   gabapentin (NEURONTIN) 400 MG capsule Take 1 capsule (400 mg total) by mouth 2 (two) times daily. 180 capsule 3   Magnesium Oxide 400 MG CAPS Take 400 mg by mouth 2 (two) times daily.      methotrexate (RHEUMATREX) 2.5 MG tablet As directed by doctor.     mometasone (NASONEX) 50 MCG/ACT nasal spray Place 2 sprays  into the nose daily. Two sprays each in each nostril 51 g 1   Multiple Vitamin (MULTIVITAMIN) tablet Take 1 tablet by mouth 2 (two) times daily. Bariatric     Netarsudil Dimesylate (RHOPRESSA) 0.02 % SOLN Apply to eye.     Omega-3 Krill Oil 500 MG CAPS Take 1 capsule by mouth daily.      omeprazole (PRILOSEC) 40 MG capsule      Riboflavin 100 MG CAPS Take 100 mg by mouth daily. VIT B2     solifenacin (VESICARE) 10  MG tablet Take 10 mg by mouth daily.     tiZANidine (ZANAFLEX) 4 MG capsule Take 4 mg by mouth 3 (three) times daily as needed for muscle spasms. Take 1 capsule (4 mg total) by mouth Three (3) times a day as needed     topiramate (TOPAMAX) 100 MG tablet Take 1 tablet (100 mg total) by mouth 2 (two) times daily. 180 tablet 3   vitamin C (ASCORBIC ACID) 500 MG tablet Take 500 mg by mouth 2 (two) times daily.     No current facility-administered medications on file prior to visit.    Allergies  Allergen Reactions   Latex     Rash/hives   Sulfa Antibiotics Other (See Comments)    Causes skin reaction ( on her mid back)  Pigmentation.    Tape    Valium [Diazepam]        Observations/Objective: Today's Vitals   08/03/19 1424  PainSc: 0-No pain   There is no height or weight on file to calculate BMI.  Physical Exam Neurological:     Mental Status: She is alert.     CBC    Component Value Date/Time   WBC 6.6 08/01/2019 1243   RBC 4.15 08/01/2019 1243   HGB 12.6 08/01/2019 1243   HCT 38.1 08/01/2019 1243   PLT 263 08/01/2019 1243   MCV 91.8 08/01/2019 1243   MCH 30.4 08/01/2019 1243   MCHC 33.1 08/01/2019 1243   RDW 14.0 08/01/2019 1243   LYMPHSABS 2.7 08/01/2019 1243   MONOABS 0.5 08/01/2019 1243   EOSABS 0.2 08/01/2019 1243   BASOSABS 0.1 08/01/2019 1243    CMP     Component Value Date/Time   NA 136 08/01/2019 1243   K 4.1 08/01/2019 1243   CL 103 08/01/2019 1243   CO2 23 08/01/2019 1243   GLUCOSE 103 (H) 08/01/2019 1243   BUN 9 08/01/2019  1243   CREATININE 0.76 08/01/2019 1243   CALCIUM 8.8 (L) 08/01/2019 1243   PROT 6.8 08/01/2019 1243   ALBUMIN 3.8 08/01/2019 1243   AST 26 08/01/2019 1243   ALT 25 08/01/2019 1243   ALKPHOS 102 08/01/2019 1243   BILITOT 1.0 08/01/2019 1243   GFRNONAA >60 08/01/2019 1243   GFRAA >60 08/01/2019 1243     Assessment and Plan: 1. 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 CEA is normal.  Continue CT abdomen pelvis image surveillance every 6 months.   Lung nodule is stable. Attention on follow up. CT chest in September 2021  colonoscopy 1 year after surgery followed up by GI. I am not able to see her colonoscopy results.  Follow Up Instructions: 3 months Orders Placed This Encounter  Procedures   CT ABDOMEN PELVIS W CONTRAST    Standing Status:   Future    Standing Expiration Date:   08/02/2020    Order Specific Question:   ** REASON FOR EXAM (FREE TEXT)    Answer:   hx sigmoid colon cancer    Order Specific Question:   If indicated for the ordered procedure, I authorize the administration of contrast media per Radiology protocol    Answer:   Yes    Order Specific  Question:   Preferred imaging location?    Answer:   [facility]    Order Specific Question:   Is Oral Contrast requested for this exam?    Answer:   Yes, Per Radiology protocol    Order Specific Question:   Radiology Contrast Protocol - do NOT remove file path    Answer:   [email or web address]   CT CHEST W CONTRAST    Standing Status:   Future    Standing Expiration Date:   08/02/2020    Order Specific Question:   ** REASON FOR EXAM (FREE TEXT)    Answer:   hx sigmoid colon coancer     Order Specific Question:   If indicated for the ordered procedure, I authorize the administration of contrast media per Radiology protocol    Answer:   Yes    Order Specific Question:   Preferred imaging location?    Answer:   [facility]    Order Specific Question:   Radiology Contrast Protocol - do NOT remove file path    Answer:   [email or web address]   CBC with Differential/Platelet    Standing Status:   Future    Standing Expiration Date:   08/02/2020   Comprehensive metabolic panel    Standing Status:   Future    Standing Expiration Date:   08/02/2020   CEA    Standing Status:   Future    Standing Expiration Date:   08/02/2020    I discussed the assessment and treatment plan with the patient. The patient was provided an opportunity to ask questions and all were answered. The patient agreed with the plan and demonstrated an understanding of the instructions.  The patient was advised to call back or seek an in-person evaluation if the symptoms worsen or if the condition fails to improve as anticipated.    Earlie Server, MD 08/03/2019 10:35 PM

## 2019-08-03 NOTE — Progress Notes (Signed)
Patient contacted for Mychart visit. No new concerns voiced.  

## 2019-08-22 ENCOUNTER — Ambulatory Visit (INDEPENDENT_AMBULATORY_CARE_PROVIDER_SITE_OTHER): Payer: Medicare Other

## 2019-08-22 DIAGNOSIS — J309 Allergic rhinitis, unspecified: Secondary | ICD-10-CM | POA: Diagnosis not present

## 2019-08-27 ENCOUNTER — Ambulatory Visit: Payer: Medicare Other | Admitting: Family Medicine

## 2019-08-29 ENCOUNTER — Ambulatory Visit (INDEPENDENT_AMBULATORY_CARE_PROVIDER_SITE_OTHER): Payer: Medicare Other | Admitting: Allergy & Immunology

## 2019-08-29 ENCOUNTER — Other Ambulatory Visit: Payer: Self-pay

## 2019-08-29 ENCOUNTER — Encounter: Payer: Self-pay | Admitting: Allergy & Immunology

## 2019-08-29 VITALS — BP 120/70 | HR 97 | Temp 98.0°F | Resp 16 | Wt 175.6 lb

## 2019-08-29 DIAGNOSIS — K219 Gastro-esophageal reflux disease without esophagitis: Secondary | ICD-10-CM | POA: Diagnosis not present

## 2019-08-29 DIAGNOSIS — J3089 Other allergic rhinitis: Secondary | ICD-10-CM | POA: Diagnosis not present

## 2019-08-29 DIAGNOSIS — J302 Other seasonal allergic rhinitis: Secondary | ICD-10-CM

## 2019-08-29 NOTE — Progress Notes (Signed)
FOLLOW UP  Date of Service/Encounter:  08/29/19   Assessment:   Seasonal and perennial allergic rhinitis(molds, dust mites, cat and dog)- on allergen immunotherapy with maintenance reached September 2016 and starting re-mixed vials once she uses up her current vials  Gastroesophageal reflux disease  Recent diagnosis of colon cancer - s/p partial resection  Plan/Recommendations:   1. Perennial and seasonal allergic rhinitis (molds, cat, dog, dust mite) - We will start the new vials once you use up your current ones.  - We are going to try to send in carbinoxamine 4mg  to use every 8 hours as needed (to replace the Allegra) - Restart the Nasonex to see if this steroid in the nose will help with the Eustchian tube drainage.  - Continue with Astelin two sprays per nostril once daily.  2. GERD - Continue with Prilosec.  3. Return in about 6 months (around 02/29/2020). This can be an in-person, a virtual Webex or a telephone follow up visit.  Subjective:   Jordan Mahoney is a 69 y.o. female presenting today for follow up of  Chief Complaint  Patient presents with  . Allergies    some left ear pain  . Gastroesophageal Reflux    no complaints     Jordan Mahoney has a history of the following: Patient Active Problem List   Diagnosis Date Noted  . 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 Iron deficiency anemia due to chronic blood loss 04/04/2018  . Gastroesophageal reflux disease 05/03/2017  . Lactose intolerance 05/03/2017  . Allergic rhinitis 11/07/2014  . Headache, migraine 03/27/2014  . Migraine 12/11/2013  . Numbness 12/11/2013  . Urinary incontinence 12/11/2013  . HA (headache)     History obtained from: chart review and patient.  Jordan Mahoney is a 69 y.o. female presenting for a follow up visit. She was last seen in April 2021. At that time, she was not having adequate control of her symptoms. Therefore we made some changes to her vials (mixed by a  previous provider) as a means of making them more efficacious. We continued with the Astelin and stopped the Allegra and Nasonex. We started RyVent and Nasacort instead.   In the interim, she states that since her last visit, she has stopped all her nasal sprays, except for occasional Astelin spray use. Since she has stopped the nasal spray, she has noticed that her left ear has occasionally hurt. The pain feels like "there is fluid in my ear." It does make it difficult for her to hear at times, which is exacerbated by the fact her hearing aids are being serviced. These episodes can happen multiple times a day. She has been seen by her ENT for it, and the physical examination has been negative. Additionally, her insurance did not cover her RyVent so she has been continuing to use her Allegra. She states that the [facility] does not seem to help with her symptoms, but overall, since starting her allergy shots, she has had vast improvements in her quality of life. She has not finished using the last of her old vials but will use the two separate vials of her allergy shots when she completes her current medication.   She has received her COVID19 vaccinations without adverse event. She received her vaccinations in February.   Otherwise, there have been no changes to her past medical history, surgical history, family history, or social history.    Review of Systems  Constitutional: Negative.  Negative for chills, fever, malaise/fatigue and weight  loss.  HENT: Positive for congestion. Negative for ear discharge, ear pain, sinus pain and sore throat.   Eyes: Negative for pain, discharge and redness.  Respiratory: Negative for cough, sputum production, shortness of breath and wheezing.   Cardiovascular: Negative.  Negative for chest pain and palpitations.  Gastrointestinal: Negative for abdominal pain, constipation, diarrhea, heartburn, nausea and vomiting.  Skin: Negative.  Negative for itching and rash.   Neurological: Negative for dizziness and headaches.  Endo/Heme/Allergies: Positive for environmental allergies. Does not bruise/bleed easily.       Objective:   Blood pressure 120/70, pulse 97, temperature 98 F (36.7 C), temperature source Temporal, resp. rate 16, weight 175 lb 9.6 oz (79.7 kg), SpO2 (!) 69 %. Body mass index is 28.34 kg/m.   Physical Exam:  Physical Exam Constitutional:      Appearance: She is well-developed.     Comments: Talkative female. She likes her cats and talks about them during the visit.   HENT:     Head: Normocephalic and atraumatic.     Right Ear: Tympanic membrane, ear canal and external ear normal.     Left Ear: Tympanic membrane, ear canal and external ear normal.     Nose: No nasal deformity, septal deviation, mucosal edema or rhinorrhea.     Right Turbinates: Enlarged and swollen.     Left Turbinates: Enlarged and swollen.     Right Sinus: No maxillary sinus tenderness or frontal sinus tenderness.     Left Sinus: No maxillary sinus tenderness or frontal sinus tenderness.     Mouth/Throat:     Mouth: Mucous membranes are not pale and not dry.     Pharynx: Uvula midline.  Eyes:     General:        Right eye: No discharge.        Left eye: No discharge.     Conjunctiva/sclera: Conjunctivae normal.     Right eye: Right conjunctiva is not injected. No chemosis.    Left eye: Left conjunctiva is not injected. No chemosis.    Pupils: Pupils are equal, round, and reactive to light.  Cardiovascular:     Rate and Rhythm: Normal rate and regular rhythm.     Heart sounds: Normal heart sounds.  Pulmonary:     Effort: Pulmonary effort is normal. No tachypnea, accessory muscle usage or respiratory distress.     Breath sounds: Normal breath sounds. No wheezing, rhonchi or rales.     Comments: Moving air well in all lung fields. No increased work of breathing noted.  Chest:     Chest wall: No tenderness.  Lymphadenopathy:     Cervical: No cervical  adenopathy.  Skin:    Coloration: Skin is not pale.     Findings: No abrasion, erythema, petechiae or rash. Rash is not papular, urticarial or vesicular.  Neurological:     Mental Status: She is alert.      Diagnostic studies: none     Salvatore Marvel, MD  [facility]

## 2019-08-29 NOTE — Patient Instructions (Addendum)
1. Perennial and seasonal allergic rhinitis (molds, cat, dog, dust mite) - We will start the new vials once you use up your current ones.  - We are going to try to send in carbinoxamine 4mg  to use every 8 hours as needed (to replace the Allegra) - Restart the Nasonex to see if this steroid in the nose will help with the Eustchian tube drainage.  - Continue with Astelin two sprays per nostril once daily.  2. GERD - Continue with Prilosec.  3. Return in about 6 months (around 02/29/2020). This can be an in-person, a virtual Webex or a telephone follow up visit.   Please inform us of any Emergency Department visits, hospitalizations, or changes in symptoms. Call us before going to the ED for breathing or allergy symptoms since we might be able to fit you in for a sick visit. Feel free to contact us anytime with any questions, problems, or concerns.  It was a pleasure to see you again today!  Websites that have reliable patient information: 1. American Academy of Asthma, Allergy, and Immunology: www.aaaai.org 2. Food Allergy Research and Education (FARE): foodallergy.org 3. Mothers of Asthmatics: http://www.asthmacommunitynetwork.org 4. American College of Allergy, Asthma, and Immunology: www.acaai.org   COVID-19 Vaccine Information can be found at: ShippingScam.co.uk For questions related to vaccine distribution or appointments, please email [email or web address] or call [phone].     "Like" [place] on Facebook and Instagram for our latest updates!        Make sure you are registered to vote! If you have moved or changed any of your contact information, you will need to get this updated before voting!  In some cases, you MAY be able to register to vote online: [email or web address]

## 2019-08-30 ENCOUNTER — Encounter: Payer: Self-pay | Admitting: Allergy & Immunology

## 2019-09-19 ENCOUNTER — Ambulatory Visit (INDEPENDENT_AMBULATORY_CARE_PROVIDER_SITE_OTHER): Payer: Medicare Other

## 2019-09-19 DIAGNOSIS — J309 Allergic rhinitis, unspecified: Secondary | ICD-10-CM | POA: Diagnosis not present

## 2019-10-15 ENCOUNTER — Other Ambulatory Visit: Payer: Self-pay

## 2019-10-15 ENCOUNTER — Ambulatory Visit (INDEPENDENT_AMBULATORY_CARE_PROVIDER_SITE_OTHER): Payer: Medicare Other | Admitting: Dermatology

## 2019-10-15 ENCOUNTER — Encounter: Payer: Self-pay | Admitting: Dermatology

## 2019-10-15 DIAGNOSIS — L820 Inflamed seborrheic keratosis: Secondary | ICD-10-CM | POA: Diagnosis not present

## 2019-10-15 DIAGNOSIS — L821 Other seborrheic keratosis: Secondary | ICD-10-CM | POA: Diagnosis not present

## 2019-10-15 DIAGNOSIS — D1800 Hemangioma unspecified site: Secondary | ICD-10-CM | POA: Diagnosis not present

## 2019-10-15 NOTE — Patient Instructions (Addendum)

## 2019-10-15 NOTE — Progress Notes (Signed)
   Follow-Up Visit   Subjective  Jordan Mahoney is a 69 y.o. female who presents for the following: growth (R upper arm ~56yrs, grows and then falls off, itches prn) and itchy spot (L 4th webspace, ~50yr, itchy).   The following portions of the chart were reviewed this encounter and updated as appropriate:      Review of Systems:  No other skin or systemic complaints except as noted in HPI or Assessment and Plan.  Objective  Well appearing patient in no apparent distress; mood and affect are within normal limits.  A focused examination was performed including R arm. Relevant physical exam findings are noted in the Assessment and Plan.  Objective  Right Upper Arm above elbow x 1, L 4th webspace x 1 (2): Erythematous keratotic or waxy stuck-on papule   Assessment & Plan    Seborrheic Keratoses - Stuck-on, waxy, tan-brown papules and plaques  - Discussed benign etiology and prognosis. - Observe - Call for any changes  Hemangiomas - Red papules - Discussed benign nature - Observe - Call for any changes  Inflamed seborrheic keratosis (2) Right Upper Arm above elbow x 1, L 4th webspace x 1  Destruction of lesion - Right Upper Arm above elbow x 1, L 4th webspace x 1  Destruction method: cryotherapy   Informed consent: discussed and consent obtained   Lesion destroyed using liquid nitrogen: Yes   Region frozen until ice ball extended beyond lesion: Yes   Outcome: patient tolerated procedure well with no complications   Post-procedure details: wound care instructions given    Return if symptoms worsen or fail to improve.  I, Othelia Pulling, RMA, am acting as scribe for Brendolyn Patty, MD . Documentation: I have reviewed the above documentation for accuracy and completeness, and I agree with the above.  Brendolyn Patty MD

## 2019-10-17 ENCOUNTER — Ambulatory Visit (INDEPENDENT_AMBULATORY_CARE_PROVIDER_SITE_OTHER): Payer: Medicare Other

## 2019-10-17 DIAGNOSIS — J309 Allergic rhinitis, unspecified: Secondary | ICD-10-CM

## 2019-10-29 ENCOUNTER — Inpatient Hospital Stay: Payer: Medicare Other | Attending: Oncology

## 2019-10-29 ENCOUNTER — Ambulatory Visit
Admission: RE | Admit: 2019-10-29 | Discharge: 2019-10-29 | Disposition: A | Discharge status: Home / Self Care | Payer: Medicare Other | Source: Ambulatory Visit | Attending: Oncology | Admitting: Oncology

## 2019-10-29 ENCOUNTER — Other Ambulatory Visit: Payer: Self-pay

## 2019-10-29 DIAGNOSIS — R918 Other nonspecific abnormal finding of lung field: Secondary | ICD-10-CM | POA: Diagnosis not present

## 2019-10-29 DIAGNOSIS — C187 Malignant neoplasm of sigmoid colon: Secondary | ICD-10-CM | POA: Diagnosis not present

## 2019-10-29 DIAGNOSIS — Z85038 Personal history of other malignant neoplasm of large intestine: Secondary | ICD-10-CM | POA: Diagnosis not present

## 2019-10-29 LAB — CBC WITH DIFFERENTIAL/PLATELET
Abs Immature Granulocytes: 0.02 10*3/uL (ref 0.00–0.07)
Basophils Absolute: 0.1 10*3/uL (ref 0.0–0.1)
Basophils Relative: 1 %
Eosinophils Absolute: 0.2 10*3/uL (ref 0.0–0.5)
Eosinophils Relative: 2 %
HCT: 38.2 % (ref 36.0–46.0)
Hemoglobin: 12.6 g/dL (ref 12.0–15.0)
Immature Granulocytes: 0 %
Lymphocytes Relative: 35 %
Lymphs Abs: 2.7 10*3/uL (ref 0.7–4.0)
MCH: 30.8 pg (ref 26.0–34.0)
MCHC: 33 g/dL (ref 30.0–36.0)
MCV: 93.4 fL (ref 80.0–100.0)
Monocytes Absolute: 0.6 10*3/uL (ref 0.1–1.0)
Monocytes Relative: 8 %
Neutro Abs: 4.1 10*3/uL (ref 1.7–7.7)
Neutrophils Relative %: 54 %
Platelets: 248 10*3/uL (ref 150–400)
RBC: 4.09 MIL/uL (ref 3.87–5.11)
RDW: 14.1 % (ref 11.5–15.5)
WBC: 7.7 10*3/uL (ref 4.0–10.5)
nRBC: 0 % (ref 0.0–0.2)

## 2019-10-29 LAB — COMPREHENSIVE METABOLIC PANEL
ALT: 24 U/L (ref 0–44)
AST: 25 U/L (ref 15–41)
Albumin: 4 g/dL (ref 3.5–5.0)
Alkaline Phosphatase: 87 U/L (ref 38–126)
Anion gap: 10 (ref 5–15)
BUN: 11 mg/dL (ref 8–23)
CO2: 24 mmol/L (ref 22–32)
Calcium: 8.9 mg/dL (ref 8.9–10.3)
Chloride: 102 mmol/L (ref 98–111)
Creatinine, Ser: 0.85 mg/dL (ref 0.44–1.00)
GFR calc Af Amer: >60 mL/min (ref >60)
GFR calc non Af Amer: >60 mL/min (ref >60)
Glucose, Bld: 105 mg/dL — ABNORMAL HIGH (ref 70–99)
Potassium: 4.1 mmol/L (ref 3.5–5.1)
Sodium: 136 mmol/L (ref 135–145)
Total Bilirubin: 0.9 mg/dL (ref 0.3–1.2)
Total Protein: 7 g/dL (ref 6.5–8.1)

## 2019-10-29 MED ORDER — IOHEXOL 300 MG/ML  SOLN
100.0000 mL | Freq: Once | INTRAMUSCULAR | Status: AC | PRN
  Administered 2019-10-29: 100 mL via INTRAVENOUS

## 2019-10-30 LAB — CEA: CEA: 1.9 ng/mL (ref 0.0–4.7)

## 2019-11-01 ENCOUNTER — Other Ambulatory Visit: Payer: Self-pay

## 2019-11-01 ENCOUNTER — Encounter: Payer: Self-pay | Admitting: Oncology

## 2019-11-01 ENCOUNTER — Inpatient Hospital Stay (HOSPITAL_BASED_OUTPATIENT_CLINIC_OR_DEPARTMENT_OTHER): Payer: Medicare Other | Admitting: Oncology

## 2019-11-01 VITALS — BP 133/90 | HR 65 | Temp 97.6°F | Resp 18 | Wt 178.1 lb

## 2019-11-01 DIAGNOSIS — Z85038 Personal history of other malignant neoplasm of large intestine: Secondary | ICD-10-CM | POA: Diagnosis not present

## 2019-11-01 DIAGNOSIS — R918 Other nonspecific abnormal finding of lung field: Secondary | ICD-10-CM | POA: Diagnosis not present

## 2019-11-01 DIAGNOSIS — C187 Malignant neoplasm of sigmoid colon: Secondary | ICD-10-CM | POA: Diagnosis not present

## 2019-11-01 NOTE — Progress Notes (Signed)
Patient reports new thyroid issues and is going to see an endocrinologist in October.

## 2019-11-01 NOTE — Progress Notes (Signed)
Hematology/Oncology follow up  [facility] [phone] [phone]) [phone]   Patient Care Team: Ludwig Clarks, FNP as PCP - General (Family Medicine) Dorthula Rue., MD as Referring Physician (Otolaryngology) Clent Jacks, RN as Registered Nurse  REFERRING PROVIDER: Dr.Toledo / Gerarda Gunther 'CHIEF COMPLAINTS/REASON FOR VISIT:  Follow up Stage IIA colorectal cancer  HISTORY OF PRESENTING ILLNESS:  Jordan Mahoney is a  69 y.o.  female with PMH listed below who was referred to me for evaluation of stage IIA colorectal cancer. Patient was initially seen care physician for evaluation of fatigue.  Lab work-up showed that patient's anemic.  Hemoccult was obtained and was positive.  Patient was referred to [facility] and was seen and evaluated by Dr. Alice Reichert.  Reports that she had 2-3 bowel movement daily sometimes mushy and sometimes formed.  Denies seeing any bright red blood in the stool.  Denies any associated abdominal pain, gastric discomfort.  Appetite seems to be stable.  History of gastric bypass in 2010.  Cholecystectomy in 2000.  ?Unintentional weight loss 5 to 6 pounds for the past 2 months.   prior records showed she weighs 149 pounds on 09/28/2017, weighed 160 pounds 05/03/2017 Today she weighs 153 pounds Colonoscopy was obtained on 03/27/2018.  6 mm polyp found in sigmoid colon.  Polyp is sessile. An ulcerated partially obstructing mass was found in the rectosigmoid colon.  Mass was circumferential.  Measured 5 cm in length.  No bleeding was present.  Biopsy was taken.  Nonbleeding internal hemorrhoids were found during retroflexion.  # underwent surgery.  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 No loss of nuclear expression of MMR proteins: Low probability of MSI-H.  Stage IIA, No adjuvant chemotherapy was not  Offered  INTERVAL  HISTORY [name] is a 70 y.o. female who has above history reviewed by me today presents for follow up visit for management of Stage IIA colorectal cancer. Problems and complaints are listed below: She reports doing well, no new compliants.   Denies any fever, chills, nausea, vomiting, chest pain, abdominal pain Appetite is good.  Has gained weight. She has hypothyroidism and plan to see endocrinologist.    Review of Systems  Constitutional: Negative for appetite change, chills, fatigue and fever.  HENT:   Negative for hearing loss and voice change.   Eyes: Negative for eye problems.  Respiratory: Negative for chest tightness and cough.   Cardiovascular: Negative for chest pain.  Gastrointestinal: Negative for abdominal distention, abdominal pain and blood in stool.  Endocrine: Negative for hot flashes.  Genitourinary: Negative for difficulty urinating and frequency.        Bladder spasm  Musculoskeletal: Negative for arthralgias.  Skin: Negative for itching and rash.  Neurological: Negative for extremity weakness.  Hematological: Negative for adenopathy.  Psychiatric/Behavioral: Negative for confusion. The patient is not nervous/anxious.     MEDICAL HISTORY:  Past Medical History:  Diagnosis Date  . Allergic rhinoconjunctivitis   . Cancer ([place])   . HA (headache)   . Hearing loss    wearing bilateral aides    SURGICAL HISTORY: Past Surgical History:  Procedure Laterality Date  . belly button    . c sections    . CATARACT EXTRACTION Right   . COLON RESECTION N/A 04/17/2018   Procedure: LAPAROSCOPIC COLON RESECTION POSSIBLE OSTOMY;  Surgeon: Benjamine Sprague, DO;  Location: [facility] ORS;  Service: General;  Laterality: N/A;  .  ELBOW SURGERY Left   . GANGLION CYST EXCISION Left 01/22/2016   base of thumb  . neck fusion  07/04/2017   Dr. Carloyn Manner in [place]  . WRIST SURGERY Right     SOCIAL HISTORY: Social History   Socioeconomic History  . Marital status: Married    Spouse  name: Not on file  . Number of children: 2  . Years of education: college  . Highest education level: Not on file  Occupational History    Comment: retired  Tobacco Use  . Smoking status: Former Smoker    Quit date: 1983    Years since quitting: 38.7  . Smokeless tobacco: Never Used  . Tobacco comment: Quit in 1985  Substance and Sexual Activity  . Alcohol use: Yes    Alcohol/week: 1.0 standard drink    Types: 1 Cans of beer per week    Comment: Rare   . Drug use: No  . Sexual activity: Not on file  Other Topics Concern  . Not on file  Social History Narrative   Patient lives at home with her husband [name]).    Education two years of college.   Caffeine two glasses of tea.   Social Determinants of Health   Financial Resource Strain:   . Difficulty of Paying Living Expenses: Not on file  Food Insecurity:   . Worried About Charity fundraiser in the Last Year: Not on file  . Ran Out of Food in the Last Year: Not on file  Transportation Needs:   . Lack of Transportation (Medical): Not on file  . Lack of Transportation (Non-Medical): Not on file  Physical Activity:   . Days of Exercise per Week: Not on file  . Minutes of Exercise per Session: Not on file  Stress:   . Feeling of Stress : Not on file  Social Connections:   . Frequency of Communication with Friends and Family: Not on file  . Frequency of Social Gatherings with Friends and Family: Not on file  . Attends Religious Services: Not on file  . Active Member of Clubs or Organizations: Not on file  . Attends Archivist Meetings: Not on file  . Marital Status: Not on file  Intimate Partner Violence:   . Fear of Current or Ex-Partner: Not on file  . Emotionally Abused: Not on file  . Physically Abused: Not on file  . Sexually Abused: Not on file    FAMILY HISTORY: Family History  Problem Relation Age of Onset  . Diabetes Father   . Stroke Father   . Squamous cell carcinoma Mother   . Migraines  Daughter   . Breast cancer Paternal Aunt   . Allergic rhinitis Neg Hx   . Angioedema Neg Hx   . Asthma Neg Hx   . Eczema Neg Hx   . Immunodeficiency Neg Hx   . Urticaria Neg Hx     ALLERGIES:  is allergic to latex, sulfa antibiotics, tape, and valium [diazepam].  MEDICATIONS:  Current Outpatient Medications  Medication Sig Dispense Refill  . azelastine (ASTELIN) 0.1 % nasal spray Place 2 sprays into both nostrils daily. 90 mL 3  . brimonidine (ALPHAGAN) 0.2 % ophthalmic solution Place 1 drop into the right eye 3 (three) times daily.    [name] CALCIUM CITRATE PO Take 500 mg by mouth 2 (two) times daily. Bariatric    . dorzolamide-timolol (COSOPT) 22.3-6.8 MG/ML ophthalmic solution Place 1 drop into the right eye 2 times daily.    [name]  eletriptan (RELPAX) 40 MG tablet Take 1 tablet at the onset of migraine. May repeat in 2 hours if headache persists or recurs. Not to exceed 2 tabs/24hours. 9 tablet 12  . Ergocalciferol (VITAMIN D2) 2000 UNITS TABS Take 2,000 Units by mouth daily.     . fexofenadine (ALLEGRA) 180 MG tablet Take 1 tablet (180 mg total) by mouth daily. (Patient taking differently: Take 180 mg by mouth 2 (two) times daily. ) 90 tablet 1  . folic acid (FOLVITE) 1 MG tablet Take 1 mg by mouth daily.     . Fremanezumab-vfrm (AJOVY) 225 MG/1.5ML SOAJ Inject 225 mg into the skin every 30 (thirty) days. 1 pen 11  . gabapentin (NEURONTIN) 400 MG capsule Take 1 capsule (400 mg total) by mouth 2 (two) times daily. 180 capsule 3  . Magnesium Oxide 400 MG CAPS Take 400 mg by mouth 2 (two) times daily.     . methotrexate (RHEUMATREX) 2.5 MG tablet As directed by doctor.    . mometasone (NASONEX) 50 MCG/ACT nasal spray Place 2 sprays into the nose daily. Two sprays each in each nostril 51 g 1  . Multiple Vitamin (MULTIVITAMIN) tablet Take 1 tablet by mouth 2 (two) times daily. Bariatric    . Netarsudil Dimesylate (RHOPRESSA) 0.02 % SOLN Apply to eye.    . Omega-3 Krill Oil 500 MG CAPS Take 1  capsule by mouth daily.     [name] omeprazole (PRILOSEC) 40 MG capsule     . Riboflavin 100 MG CAPS Take 100 mg by mouth daily. VIT B2    . solifenacin (VESICARE) 10 MG tablet Take 10 mg by mouth daily.    [name] tiZANidine (ZANAFLEX) 4 MG capsule Take 4 mg by mouth 3 (three) times daily as needed for muscle spasms. Take 1 capsule (4 mg total) by mouth Three (3) times a day as needed    . topiramate (TOPAMAX) 100 MG tablet Take 1 tablet (100 mg total) by mouth 2 (two) times daily. 180 tablet 3  . Carbinoxamine Maleate (RYVENT) 6 MG TABS Take 1 tablet by mouth in the morning and at bedtime. (Patient not taking: Reported on 08/29/2019) 180 tablet 2  . vitamin C (ASCORBIC ACID) 500 MG tablet Take 500 mg by mouth 2 (two) times daily.     No current facility-administered medications for this visit.     PHYSICAL EXAMINATION: ECOG PERFORMANCE STATUS: 1 - Symptomatic but completely ambulatory Vitals:   11/01/19 1002  BP: 133/90  Pulse: 65  Resp: 18  Temp: 97.6 F (36.4 C)   Filed Weights   11/01/19 1002  Weight: 178 lb 1.6 oz (80.8 kg)    Physical Exam Constitutional:      General: She is not in acute distress. HENT:     Head: Normocephalic and atraumatic.  Eyes:     General: No scleral icterus.    Pupils: Pupils are equal, round, and reactive to light.  Cardiovascular:     Rate and Rhythm: Normal rate and regular rhythm.     Heart sounds: Normal heart sounds.  Pulmonary:     Effort: Pulmonary effort is normal. No respiratory distress.     Breath sounds: No wheezing.  Abdominal:     General: Bowel sounds are normal. There is no distension.     Palpations: Abdomen is soft. There is no mass.     Tenderness: There is no abdominal tenderness.  Musculoskeletal:        General: No deformity. Normal range of motion.  Cervical back: Normal range of motion and neck supple.  Skin:    General: Skin is warm and dry.     Findings: No erythema or rash.  Neurological:     Mental Status: She is  alert and oriented to person, place, and time.     Cranial Nerves: No cranial nerve deficit.     Coordination: Coordination normal.  Psychiatric:        Behavior: Behavior normal.        Thought Content: Thought content normal.     RADIOGRAPHIC STUDIES: I have personally reviewed the radiological images as listed and agreed with the findings in the report. CT CHEST W CONTRAST  Result Date: 10/29/2019 CLINICAL DATA:  Restaging colon cancer. EXAM: CT CHEST, ABDOMEN, AND PELVIS WITH CONTRAST TECHNIQUE: Multidetector CT imaging of the chest, abdomen and pelvis was performed following the standard protocol during bolus administration of intravenous contrast. CONTRAST:  177m OMNIPAQUE IOHEXOL 300 MG/ML  SOLN COMPARISON:  04/30/2019 FINDINGS: CT CHEST FINDINGS Cardiovascular: Heart size is normal. No pericardial effusion identified. Mild aortic atherosclerosis. Mediastinum/Nodes: No enlarged mediastinal, hilar, or axillary lymph nodes. Thyroid gland, trachea, and esophagus demonstrate no significant findings. Lungs/Pleura: No pleural effusion, airspace consolidation, or atelectasis identified. Unchanged 3 mm nodule within the anterior right lung apex, image 21/5. 6 mm nodule along the minor fissure appears stable, image 66/5. No new or enlarging lung nodules identified. Musculoskeletal: No chest wall mass or suspicious bone lesions identified. CT ABDOMEN PELVIS FINDINGS Hepatobiliary: No suspicious liver lesion. Previous cholecystectomy with intrahepatic and common bile duct dilatation. Pancreas: Unremarkable. No pancreatic ductal dilatation or surrounding inflammatory changes. Spleen: Normal in size without focal abnormality. Adrenals/Urinary Tract: Normal appearance of the adrenal glands. Scarring is noted in the upper pole of left kidney. Bilateral parapelvic cysts. No suspicious mass or hydronephrosis. Stomach/Bowel: Small hiatal hernia. Postoperative changes involving the stomach and small bowel noted.  There is no bowel wall thickening, inflammation or distension. Anastomosis noted in the sigmoid colon. Vascular/Lymphatic: Mild aortic atherosclerosis. No aneurysm. No abdominopelvic adenopathy. Reproductive: Status post hysterectomy. No adnexal masses. Other: No free fluid or fluid collections Musculoskeletal: No acute or significant osseous findings. IMPRESSION: 1. Stable CT of the chest, abdomen and pelvis. No specific findings identified to suggest residual or recurrence of tumor or metastatic disease. 2. Unchanged appearance of subpleural lymph node along the minor fissure. 3. Aortic atherosclerosis. Aortic Atherosclerosis (ICD10-I70.0). Electronically Signed   By: TKerby MoorsM.D.   On: 10/29/2019 15:28   CT ABDOMEN PELVIS W CONTRAST  Result Date: 10/29/2019 CLINICAL DATA:  Restaging colon cancer. EXAM: CT CHEST, ABDOMEN, AND PELVIS WITH CONTRAST TECHNIQUE: Multidetector CT imaging of the chest, abdomen and pelvis was performed following the standard protocol during bolus administration of intravenous contrast. CONTRAST:  1086mOMNIPAQUE IOHEXOL 300 MG/ML  SOLN COMPARISON:  04/30/2019 FINDINGS: CT CHEST FINDINGS Cardiovascular: Heart size is normal. No pericardial effusion identified. Mild aortic atherosclerosis. Mediastinum/Nodes: No enlarged mediastinal, hilar, or axillary lymph nodes. Thyroid gland, trachea, and esophagus demonstrate no significant findings. Lungs/Pleura: No pleural effusion, airspace consolidation, or atelectasis identified. Unchanged 3 mm nodule within the anterior right lung apex, image 21/5. 6 mm nodule along the minor fissure appears stable, image 66/5. No new or enlarging lung nodules identified. Musculoskeletal: No chest wall mass or suspicious bone lesions identified. CT ABDOMEN PELVIS FINDINGS Hepatobiliary: No suspicious liver lesion. Previous cholecystectomy with intrahepatic and common bile duct dilatation. Pancreas: Unremarkable. No pancreatic ductal dilatation or  surrounding inflammatory changes. Spleen: Normal in  size without focal abnormality. Adrenals/Urinary Tract: Normal appearance of the adrenal glands. Scarring is noted in the upper pole of left kidney. Bilateral parapelvic cysts. No suspicious mass or hydronephrosis. Stomach/Bowel: Small hiatal hernia. Postoperative changes involving the stomach and small bowel noted. There is no bowel wall thickening, inflammation or distension. Anastomosis noted in the sigmoid colon. Vascular/Lymphatic: Mild aortic atherosclerosis. No aneurysm. No abdominopelvic adenopathy. Reproductive: Status post hysterectomy. No adnexal masses. Other: No free fluid or fluid collections Musculoskeletal: No acute or significant osseous findings. IMPRESSION: 1. Stable CT of the chest, abdomen and pelvis. No specific findings identified to suggest residual or recurrence of tumor or metastatic disease. 2. Unchanged appearance of subpleural lymph node along the minor fissure. 3. Aortic atherosclerosis. Aortic Atherosclerosis (ICD10-I70.0). Electronically Signed   By: Kerby Moors M.D.   On: 10/29/2019 15:28      LABORATORY DATA:  I have reviewed the data as listed Lab Results  Component Value Date   WBC 7.7 10/29/2019   HGB 12.6 10/29/2019   HCT 38.2 10/29/2019   MCV 93.4 10/29/2019   PLT 248 10/29/2019   Recent Labs    04/30/19 0956 08/01/19 1243 10/29/19 1238  NA 136 136 136  K 3.4* 4.1 4.1  CL 110 103 102  CO2 20* 23 24  GLUCOSE 96 103* 105*  BUN _0 CREATININE 0.76 0.76 0.85  CALCIUM 8.5* 8.8* 8.9  GFRNONAA >60 >60 >60  GFRAA >60 >60 >60  PROT 6.5 6.8 7.0  ALBUMIN 3.8 3.8 4.0  AST _1 ALT _2 ALKPHOS 95 102 87  BILITOT 0.7 1.0 0.9   Iron/TIBC/Ferritin/ %Sat    Component Value Date/Time   IRON 83 07/31/2018 0947   TIBC 411 07/31/2018 0947   FERRITIN 16 07/31/2018 0947   IRONPCTSAT 20 07/31/2018 0947     Preop CEA was obtained on 04/03/2018, at 1.8  ASSESSMENT & PLAN:  1.  Adenocarcinoma of sigmoid colon ([place])   2. Lung nodules   Cancer Staging Adenocarcinoma of sigmoid colon ([place]) Staging form: Colon and Rectum, AJCC 8th Edition - Clinical: No stage assigned - Unsigned - Pathologic stage from 05/04/2018: Stage IIA (pT3, pN0, cM0) - Signed by Earlie Server, MD on 05/04/2018  # Stage IIA sigmoid colon  Clinically she is doing well.  Continue surveillance.  Labs are reviewed and discussed with patient. Stable counts.  Recommend follow up every 6 months, with labs and H&P.  CT was independently reviewed by me and discussed with patient. No evidence of recurrence.  Obtain CT in 6 months.  Patient will need to follow up with GI.   # Lung nodule, stable.   Clinically doing well. Recommend repeat CBC,CMP CEA  in 3 months.    Orders Placed This Encounter  Procedures  . CT CHEST ABDOMEN PELVIS W CONTRAST    Standing Status:   Future    Standing Expiration Date:   10/31/2020    Order Specific Question:   Preferred imaging location?    Answer:   [facility]    Order Specific Question:   Radiology Contrast Protocol - do NOT remove file path    Answer:   _[email or web address]  . CBC with Differential/Platelet    Standing Status:   Future    Standing Expiration Date:   10/31/2020  . Comprehensive metabolic panel    Standing Status:   Future    Standing Expiration Date:   10/31/2020  . CEA    Standing  Status:   Future    Standing Expiration Date:   10/31/2020    We spent sufficient time to discuss many aspect of care, questions were answered to patient's satisfaction. Follow-up in 3 months,  Earlie Server, MD, PhD  11/01/2019

## 2019-11-13 ENCOUNTER — Encounter: Payer: Self-pay | Admitting: *Deleted

## 2019-11-14 ENCOUNTER — Ambulatory Visit (INDEPENDENT_AMBULATORY_CARE_PROVIDER_SITE_OTHER): Payer: Medicare Other

## 2019-11-14 DIAGNOSIS — J309 Allergic rhinitis, unspecified: Secondary | ICD-10-CM

## 2019-11-15 ENCOUNTER — Encounter: Payer: Self-pay | Admitting: Neurology

## 2019-11-15 ENCOUNTER — Ambulatory Visit (INDEPENDENT_AMBULATORY_CARE_PROVIDER_SITE_OTHER): Payer: Medicare Other | Admitting: Neurology

## 2019-11-15 ENCOUNTER — Other Ambulatory Visit: Payer: Self-pay

## 2019-11-15 VITALS — BP 132/82 | HR 60 | Ht 66.0 in | Wt 180.0 lb

## 2019-11-15 DIAGNOSIS — IMO0002 Reserved for concepts with insufficient information to code with codable children: Secondary | ICD-10-CM | POA: Insufficient documentation

## 2019-11-15 DIAGNOSIS — R202 Paresthesia of skin: Secondary | ICD-10-CM

## 2019-11-15 DIAGNOSIS — R269 Unspecified abnormalities of gait and mobility: Secondary | ICD-10-CM

## 2019-11-15 DIAGNOSIS — G43709 Chronic migraine without aura, not intractable, without status migrainosus: Secondary | ICD-10-CM

## 2019-11-15 NOTE — Progress Notes (Signed)
Chief Complaint  Patient presents with  . Consult    She is here with her husband, Jordan Mahoney, to be evaluatd for back pain and numbness/tingling in feet. Hx of neck surgery. She is also treated here for migraines. Her medication regimen consist of Ajovy, Topamax, Neurontin and Relpax.  [name] PCP    Ludwig Clarks, FNP    HISTORICAL  Jordan Mahoney is a 69 year old female, seen in request by her primary care nurse practitioner Ludwig Clarks for evaluation of low back pain, numbness tingling in feet   I reviewed and summarized the referring note.  Past medical history Chronic migraine headaches, Neck fusion in 2019, for bilateral upper extremity paresthesia and weakness, she also had mild gait abnormality, surgery did help her symptoms, but she still has residual bilateral hands paresthesia.  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  She is taking methotrexate for recurrent uveitis, is under good control, also reported a history of  She also has a long history of chronic migraine headaches, taking frequent Relpax as needed, which has helped her symptoms.  Personally reviewed MRI of cervical spine in November 2015, prominent spondylitic changes at the C6-7, C5-6, with mild canal, foraminal narrowing, without evidence of significant compression.  REVIEW OF SYSTEMS: Full 14 system review of systems performed and notable only for as  above All other review of systems were negative.  ALLERGIES: Allergies  Allergen Reactions  . Latex     Rash/hives  . Sulfa Antibiotics Other (See Comments)    Causes skin reaction ( on her mid back)  Pigmentation.   . Tape   . Valium [Diazepam]     HOME MEDICATIONS: Current Outpatient Medications  Medication Sig Dispense Refill  . azelastine (ASTELIN) 0.1 % nasal spray Place 2 sprays into both nostrils daily. 90 mL 3  . brimonidine (ALPHAGAN) 0.2 % ophthalmic solution Place 1 drop into the right eye 3 (three) times daily.    [name] CALCIUM CITRATE PO Take 500 mg by mouth 2 (two) times daily. Bariatric    . Carbinoxamine Maleate (RYVENT) 6 MG TABS Take 1 tablet by mouth in the morning and at bedtime. 180 tablet 2  . dorzolamide-timolol (COSOPT) 22.3-6.8 MG/ML ophthalmic solution Place 1 drop into the right eye 2 times daily.    [name] eletriptan (RELPAX) 40 MG tablet Take 1 tablet at the onset of migraine. May repeat in 2 hours if headache persists or recurs. Not to exceed 2 tabs/24hours. 9 tablet 12  . Ergocalciferol (VITAMIN D2) 2000 UNITS TABS Take 2,000 Units by mouth daily.     . fexofenadine (ALLEGRA) 180 MG tablet Take 1 tablet (180 mg total) by mouth daily. (Patient taking differently: Take 180 mg by mouth 2 (two) times daily. ) 90 tablet 1  . folic acid (FOLVITE) 1 MG tablet Take 1 mg by mouth daily.     . Fremanezumab-vfrm (AJOVY) 225 MG/1.5ML SOAJ Inject 225 mg into the skin every 30 (thirty) days. 1 pen 11  .  gabapentin (NEURONTIN) 400 MG capsule Take 1 capsule (400 mg total) by mouth 2 (two) times daily. 180 capsule 3  . Magnesium Oxide 400 MG CAPS Take 400 mg by mouth 2 (two) times daily.     . methotrexate (RHEUMATREX) 2.5 MG tablet As directed by doctor.    . mometasone (NASONEX) 50 MCG/ACT nasal spray Place 2 sprays into the nose daily. Two sprays each in each nostril 51 g 1  . Multiple Vitamin (MULTIVITAMIN) tablet Take 1 tablet by mouth 2 (two) times daily. Bariatric    .  Netarsudil Dimesylate (RHOPRESSA) 0.02 % SOLN Apply to eye.    . Omega-3 Krill Oil 500 MG CAPS Take 1 capsule by mouth daily.     [name] omeprazole (PRILOSEC) 40 MG capsule     . Riboflavin 100 MG CAPS Take 100 mg by mouth daily. VIT B2    . solifenacin (VESICARE) 10 MG tablet Take 10 mg by mouth daily.    [name] tiZANidine (ZANAFLEX) 4 MG capsule Take 4 mg by mouth 3 (three) times daily as needed for muscle spasms. Take 1 capsule (4 mg total) by mouth Three (3) times a day as needed    . topiramate (TOPAMAX) 100 MG tablet Take 1 tablet (100 mg total) by mouth 2 (two) times daily. 180 tablet 3  . vitamin C (ASCORBIC ACID) 500 MG tablet Take 500 mg by mouth 2 (two) times daily.     No current facility-administered medications for this visit.    PAST MEDICAL HISTORY: Past Medical History:  Diagnosis Date  . Allergic rhinoconjunctivitis   . Allergies   . Back pain   . Chronic uveitis   . Frequent UTI   . GERD (gastroesophageal reflux disease)   . HA (headache)   . Hearing loss    wearing bilateral aides  . Lactose intolerance   . Migraine   . Osteopenia   . Pre-diabetes   . 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 Sensorineural hearing loss   . Tingling of both feet   . Tinnitus     PAST SURGICAL HISTORY: Past Surgical History:  Procedure Laterality Date  . ABDOMINAL HYSTERECTOMY    . belly button    . c sections    . CATARACT EXTRACTION Right   . CHOLECYSTECTOMY    . COLON RESECTION N/A 04/17/2018   Procedure: LAPAROSCOPIC COLON RESECTION POSSIBLE OSTOMY;  Surgeon: Benjamine Sprague, DO;  Location: [facility] ORS;  Service: General;  Laterality: N/A;  . ELBOW SURGERY Left   . GANGLION CYST EXCISION Left 01/22/2016   base of thumb  . GASTRIC BYPASS    . HERNIA REPAIR    . neck fusion  07/04/2017   Dr. Carloyn Manner in [place]  . WRIST SURGERY Right     FAMILY HISTORY: Family History  Problem Relation Age of Onset  . Diabetes Father   . Stroke Father   . Squamous cell carcinoma Mother   . Migraines Daughter    . Breast cancer Paternal Aunt   . Allergic rhinitis Neg Hx   . Angioedema Neg Hx   . Asthma Neg Hx   . Eczema Neg Hx   . Immunodeficiency Neg Hx   . Urticaria Neg Hx     SOCIAL HISTORY: Social History   Socioeconomic History  . Marital status: Married    Spouse name: Not on file  . Number of children: 2  . Years of education: college  . Highest education level: Not on file  Occupational History  Comment: retired  Tobacco Use  . Smoking status: Former Smoker    Quit date: 1983    Years since quitting: 38.7  . Smokeless tobacco: Never Used  . Tobacco comment: Quit in 1985  Substance and Sexual Activity  . Alcohol use: Yes    Alcohol/week: 1.0 standard drink    Types: 1 Cans of beer per week    Comment: Rare   . Drug use: No  . Sexual activity: Not on file  Other Topics Concern  . Not on file  Social History Narrative   Patient lives at home with her husband Jordan Mahoney).    Education two years of college.   Caffeine - two glasses of tea.   Social Determinants of Health   Financial Resource Strain:   . Difficulty of Paying Living Expenses: Not on file  Food Insecurity:   . Worried About Charity fundraiser in the Last Year: Not on file  . Ran Out of Food in the Last Year: Not on file  Transportation Needs:   . Lack of Transportation (Medical): Not on file  . Lack of Transportation (Non-Medical): Not on file  Physical Activity:   . Days of Exercise per Week: Not on file  . Minutes of Exercise per Session: Not on file  Stress:   . Feeling of Stress : Not on file  Social Connections:   . Frequency of Communication with Friends and Family: Not on file  . Frequency of Social Gatherings with Friends and Family: Not on file  . Attends Religious Services: Not on file  . Active Member of Clubs or Organizations: Not on file  . Attends Archivist Meetings: Not on file  . Marital Status: Not on file  Intimate Partner Violence:   . Fear of Current or  Ex-Partner: Not on file  . Emotionally Abused: Not on file  . Physically Abused: Not on file  . Sexually Abused: Not on file     PHYSICAL EXAM   Vitals:   11/15/19 0951  BP: 132/82  Pulse: 60  Weight: 180 lb (81.6 kg)  Height: 5\' 6"  (1.676 m)   Not recorded     Body mass index is 29.05 kg/m.  PHYSICAL EXAMNIATION:  Gen: NAD, conversant, well nourised, well groomed                     Cardiovascular: Regular rate rhythm, no peripheral edema, warm, nontender. Eyes: Conjunctivae clear without exudates or hemorrhage Neck: Supple, no carotid bruits. Pulmonary: Clear to auscultation bilaterally   NEUROLOGICAL EXAM:  MENTAL STATUS: Speech/cognition: Awake alert oriented to history taking and casual conversation   CRANIAL NERVES: CN II: Visual fields are full to confrontation. Pupils are round equal and briskly reactive to light. CN III, IV, VI: extraocular movement are normal. No ptosis. CN V: Facial sensation is intact to light touch CN VII: Face is symmetric with normal eye closure  CN VIII: Hearing is normal to causal conversation. CN IX, X: Phonation is normal. CN XI: Head turning and shoulder shrug are intact  MOTOR: There is no pronator drift of out-stretched arms. Muscle bulk and tone are normal. Muscle strength is normal.  REFLEXES: Reflexes are 3 and symmetric at the biceps, triceps, knees, and ankles. Plantar responses are flexor.  SENSORY: Intact to light touch, pinprick and vibratory sensation are intact in fingers and toes.  COORDINATION: There is no trunk or limb dysmetria noted.  GAIT/STANCE: She can get up from seated position  arm crossed, wide-based, cautious, mildly stiff   DIAGNOSTIC DATA (LABS, IMAGING, TESTING) - I reviewed patient records, labs, notes, testing and imaging myself where available.   ASSESSMENT AND PLAN  Harini Dearmond is a 69 y.o. 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 Ph.D.  [facility] [address] [place], [place] [address] Ph: [phone] Fax: [phone]  CC:  Ludwig Clarks, [place],  [place] [id number]

## 2019-11-16 ENCOUNTER — Ambulatory Visit (INDEPENDENT_AMBULATORY_CARE_PROVIDER_SITE_OTHER): Payer: Medicare Other | Admitting: "Endocrinology

## 2019-11-16 ENCOUNTER — Encounter: Payer: Self-pay | Admitting: "Endocrinology

## 2019-11-16 ENCOUNTER — Other Ambulatory Visit: Payer: Self-pay

## 2019-11-16 VITALS — BP 116/77 | HR 53 | Resp 16 | Ht 66.0 in | Wt 181.5 lb

## 2019-11-16 DIAGNOSIS — E039 Hypothyroidism, unspecified: Secondary | ICD-10-CM

## 2019-11-16 MED ORDER — LEVOTHYROXINE SODIUM 50 MCG PO TABS: 50.0000 ug | ORAL_TABLET | Freq: Every day | ORAL | 0 refills | Status: DC

## 2019-11-16 MED ORDER — LEVOTHYROXINE SODIUM 50 MCG PO TABS
50.0000 ug | ORAL_TABLET | Freq: Every day | ORAL | 1 refills | Status: DC
Start: 2019-11-16 — End: 2020-02-18

## 2019-11-16 NOTE — Progress Notes (Signed)
Endocrinology Consult Note                                         11/16/2019, 10:10 AM   Jordan Mahoney is a 69 y.o.-year-old female patient being seen in consultation for hypothyroidism referred by Ludwig Clarks, FNP.   Past Medical History:  Diagnosis Date  . Allergic rhinoconjunctivitis   . Allergies   . Back pain   . Chronic uveitis   . Frequent UTI   . GERD (gastroesophageal reflux disease)   . HA (headache)   . Hearing loss    wearing bilateral aides  . Lactose intolerance   . Migraine   . Osteopenia   . Pre-diabetes   . 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 Sensorineural hearing loss   . Tingling of both feet   . Tinnitus     Past Surgical History:  Procedure Laterality Date  . ABDOMINAL HYSTERECTOMY    . belly button    . c sections    . CATARACT EXTRACTION Right   . CHOLECYSTECTOMY    . COLON RESECTION N/A 04/17/2018   Procedure: LAPAROSCOPIC COLON RESECTION POSSIBLE OSTOMY;  Surgeon: Benjamine Sprague, DO;  Location: [facility] ORS;  Service: General;  Laterality: N/A;  . ELBOW SURGERY Left   . GANGLION CYST EXCISION Left 01/22/2016   base of thumb  . GASTRIC BYPASS    . HERNIA REPAIR    . neck fusion  07/04/2017   Dr. Carloyn Manner in [place]  . SPINE SURGERY    . WRIST SURGERY Right     Social History   Socioeconomic History  . Marital status: Married    Spouse name: Not on file  . Number of children: 2  . Years of education: college  . Highest education level: Not on file  Occupational History    Comment: retired  Tobacco Use  . Smoking status: Former Smoker    Quit date: 1983    Years since quitting: 38.7  . Smokeless tobacco: Never Used  . Tobacco comment: Quit in 1985  Substance and Sexual Activity  . Alcohol use: Yes    Alcohol/week: 1.0 standard drink    Types: 1 Cans of beer per week    Comment: Rare   . Drug use: No  . Sexual activity: Not on file  Other Topics Concern  . Not on  file  Social History Narrative   Patient lives at home with her husband Jordan Mahoney).    Education two years of college.   Caffeine - two glasses of tea.   Social Determinants of Health   Financial Resource Strain:   . Difficulty of Paying Living Expenses: Not on file  Food Insecurity:   . Worried About Charity fundraiser in the Last Year: Not on file  . Ran Out of Food in the Last Year: Not on file  Transportation Needs:   . Lack of Transportation (Medical): Not on file  .  Lack of Transportation (Non-Medical): Not on file  Physical Activity:   . Days of Exercise per Week: Not on file  . Minutes of Exercise per Session: Not on file  Stress:   . Feeling of Stress : Not on file  Social Connections:   . Frequency of Communication with Friends and Family: Not on file  . Frequency of Social Gatherings with Friends and Family: Not on file  . Attends Religious Services: Not on file  . Active Member of Clubs or Organizations: Not on file  . Attends Archivist Meetings: Not on file  . Marital Status: Not on file    Family History  Problem Relation Age of Onset  . Diabetes Father   . Stroke Father   . Squamous cell carcinoma Mother   . Migraines Daughter   . Breast cancer Paternal Aunt   . Allergic rhinitis Neg Hx   . Angioedema Neg Hx   . Asthma Neg Hx   . Eczema Neg Hx   . Immunodeficiency Neg Hx   . Urticaria Neg Hx     Outpatient Encounter Medications as of 11/16/2019  Medication Sig  . azelastine (ASTELIN) 0.1 % nasal spray Place 2 sprays into both nostrils daily.  [name] CALCIUM CITRATE PO Take 500 mg by mouth 2 (two) times daily. Bariatric  . dorzolamide-timolol (COSOPT) 22.3-6.8 MG/ML ophthalmic solution Place 1 drop into the right eye 2 times daily.  [name] eletriptan (RELPAX) 40 MG tablet Take 1 tablet at the onset of migraine. May repeat in 2 hours if headache persists or recurs. Not to exceed 2 tabs/24hours.  . Ergocalciferol (VITAMIN D2) 2000 UNITS TABS Take 2,000  Units by mouth daily.   . fexofenadine (ALLEGRA) 180 MG tablet Take 1 tablet (180 mg total) by mouth daily. (Patient taking differently: Take 180 mg by mouth 2 (two) times daily. )  . folic acid (FOLVITE) 1 MG tablet Take 1 mg by mouth daily.   . Fremanezumab-vfrm (AJOVY) 225 MG/1.5ML SOAJ Inject 225 mg into the skin every 30 (thirty) days.  [name] gabapentin (NEURONTIN) 400 MG capsule Take 1 capsule (400 mg total) by mouth 2 (two) times daily.  . Magnesium Oxide 400 MG CAPS Take 400 mg by mouth 2 (two) times daily.   . methotrexate (RHEUMATREX) 2.5 MG tablet As directed by doctor.  . mometasone (NASONEX) 50 MCG/ACT nasal spray Place 2 sprays into the nose daily. Two sprays each in each nostril  . Multiple Vitamin (MULTIVITAMIN) tablet Take 1 tablet by mouth 2 (two) times daily. Bariatric  . Netarsudil Dimesylate (RHOPRESSA) 0.02 % SOLN Apply to eye.  . Omega-3 Krill Oil 500 MG CAPS Take 1 capsule by mouth daily.   . Omeprazole (PRILOSEC PO) Take 20 mg by mouth in the morning and at bedtime.  . Riboflavin 100 MG CAPS Take 100 mg by mouth daily. VIT B2  . solifenacin (VESICARE) 10 MG tablet Take 10 mg by mouth daily.  [name] tiZANidine (ZANAFLEX) 4 MG capsule Take 4 mg by mouth 3 (three) times daily as needed for muscle spasms. Take 1 capsule (4 mg total) by mouth Three (3) times a day as needed  . topiramate (TOPAMAX) 100 MG tablet Take 1 tablet (100 mg total) by mouth 2 (two) times daily.  . vitamin C (ASCORBIC ACID) 500 MG tablet Take 500 mg by mouth 2 (two) times daily.  . brimonidine (ALPHAGAN) 0.2 % ophthalmic solution Place 1 drop into the right eye 3 (three) times daily.  [name] levothyroxine (  SYNTHROID) 50 MCG tablet Take 1 tablet (50 mcg total) by mouth daily before breakfast.  . [DISCONTINUED] Carbinoxamine Maleate (RYVENT) 6 MG TABS Take 1 tablet by mouth in the morning and at bedtime. (Patient not taking: Reported on 11/16/2019)  . [DISCONTINUED] levothyroxine (SYNTHROID) 50 MCG tablet Take 1 tablet  (50 mcg total) by mouth daily before breakfast.  . [DISCONTINUED] omeprazole (PRILOSEC) 40 MG capsule  (Patient not taking: Reported on 11/16/2019)   No facility-administered encounter medications on file as of 11/16/2019.    ALLERGIES: Allergies  Allergen Reactions  . Latex     Rash/hives  . Sulfa Antibiotics Other (See Comments)    Causes skin reaction ( on her mid back)  Pigmentation.   . Tape   . Valium [Diazepam]     Violent dreams   VACCINATION STATUS:  There is no immunization history on file for this patient.   HPI    Jordan Mahoney  is a patient with the above medical history.  History is obtained directly from the patient and chart review.  She denies any prior history of thyroid dysfunction.  She is not on any thyroid hormone supplement or antithyroid medications.   She was found to have elevated TSH on August 20, 2019 associated with low free T4.  Repeat labs in August showed similar findings.   -She reports symptoms including weight gain, fatigue, cold intolerance.  She has other medical problems including iritis, dry skin, dry brittle nails.  She has family history of thyroid dysfunction in her mother and in her sibling.  She has no family history of thyroid malignancy.   Lab Results  Component Value Date   TSH 5.940 (H) 11/15/2019     Pt denies feeling nodules in neck, hoarseness, dysphagia/odynophagia, SOB with lying down.  No history of  radiation therapy to head or neck. No recent use of iodine supplements.  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   ROS:  Constitutional: + Weight gain, + fatigue, no subjective hyperthermia, + subjective hypothermia Eyes: no blurry vision, no xerophthalmia ENT: no sore throat, no nodules palpated in throat, no dysphagia/odynophagia, no hoarseness Cardiovascular: no Chest Pain, no Shortness of Breath, no palpitations, no leg  swelling Respiratory: no cough, no SOB Gastrointestinal: no Nausea/Vomiting/Diarhhea Musculoskeletal: no muscle/joint aches Skin: + Dry skin, no rashes Neurological: no tremors, no numbness, no tingling, no dizziness Psychiatric: no depression, no anxiety   Physical Exam: BP 116/77   Pulse (!) 53   Resp 16   Ht 5\' 6"  (1.676 m)   Wt 181 lb 8 oz (82.3 kg)   SpO2 97%   BMI 29.29 kg/m  Wt Readings from Last 3 Encounters:  11/16/19 181 lb 8 oz (82.3 kg)  11/15/19 180 lb (81.6 kg)  11/01/19 178 lb 1.6 oz (80.8 kg)    Constitutional:  Body mass index is 29.29 kg/m., not in acute distress, normal state of mind Eyes: PERRLA, EOMI, no exophthalmos ENT: moist mucous membranes, no thyromegaly, no cervical lymphadenopathy Cardiovascular: normal precordial activity, Regular Rate and Rhythm, no Murmur/Rubs/Gallops Respiratory:  adequate breathing efforts, no gross chest deformity, Clear to auscultation bilaterally Gastrointestinal: abdomen soft, Non -tender, No distension, Bowel Sounds present Musculoskeletal: + Anterior neck scar from cervical spinal surgery, no gross deformities, strength intact in all four extremities Skin: moist, warm, no rashes Neurological: no tremor with outstretched hands, Deep tendon reflexes normal in all four extremities.   CMP (  most recent) CMP     Component Value Date/Time   NA 136 10/29/2019 1238   K 4.1 10/29/2019 1238   CL 102 10/29/2019 1238   CO2 24 10/29/2019 1238   GLUCOSE 105 (H) 10/29/2019 1238   BUN 11 10/29/2019 1238   CREATININE 0.85 10/29/2019 1238   CALCIUM 8.9 10/29/2019 1238   PROT WILL FOLLOW 11/15/2019 1048   ALBUMIN 4.0 10/29/2019 1238   AST 25 10/29/2019 1238   ALT 24 10/29/2019 1238   ALKPHOS 87 10/29/2019 1238   BILITOT 0.9 10/29/2019 1238   GFRNONAA >60 10/29/2019 1238   GFRAA >60 10/29/2019 1238     Diabetic Labs (most recent): Lab Results  Component Value Date   HGBA1C 6.0 (H) 11/15/2019     Lipid Panel ( most  recent) Lipid Panel  No results found for: CHOL, TRIG, HDL, CHOLHDL, VLDL, LDLCALC, LDLDIRECT, LABVLDL     Lab Results  Component Value Date   TSH 5.940 (H) 11/15/2019       ASSESSMENT: 1. Hypothyroidism 2.  Prediabetes  PLAN:    Patient with new diagnosis of hypothyroidism.  Her thyroid work-up so far is consistent with hypothyroidism.  She will benefit from early initiation of levothyroxine.  I discussed and prescribed levothyroxine 50 mcg p.o. daily before breakfast.   - We discussed about correct intake of levothyroxine, at fasting, with water, separated by at least 30 minutes from breakfast, and separated by more than 4 hours from calcium, iron, multivitamins, acid reflux medications (PPIs). -Patient is made aware of the fact that thyroid hormone replacement is needed for life, dose to be adjusted by periodic monitoring of thyroid function tests. - Will check thyroid tests before next visit: TSH, free T4.  Her next lab work will include TPO and thyroglobulin bodies to confirm etiology of hypothyroidism. -Due to absence of clinical goiter, no need for thyroid ultrasound. -Her previsit labs show A1c of 6%..  She was never diagnosed and treated for diabetes, likely indicating prediabetes.  She will be given dietary advice next visit.  She is advised to maintain close follow-up with her PCP.   - Time spent with the patient: 45 minutes, of which >50% was spent in obtaining information about her symptoms, reviewing her previous labs, evaluations, and treatments, counseling her about her new onset hypothyroidism, and developing a plan to confirm the diagnosis and long term treatment as necessary. Please refer to " Patient Self Inventory" in the Media  tab for reviewed elements of pertinent patient history.  Jordan Mahoney participated in the discussions, expressed understanding, and voiced agreement with the above plans.  All questions were answered to her satisfaction. she is encouraged  to contact clinic should she have any questions or concerns prior to her return visit.  Return in about 3 months (around 02/16/2020) for F/U with Pre-visit Labs.  Jordan Lloyd, MD [facility] [address] [place], [place] [address] Phone: [phone]  Fax: [phone]   11/16/2019, 10:10 AM  This note was partially dictated with voice recognition software. Similar sounding words can be transcribed inadequately or may not  be corrected upon review.

## 2019-11-19 ENCOUNTER — Ambulatory Visit (INDEPENDENT_AMBULATORY_CARE_PROVIDER_SITE_OTHER): Payer: Medicare Other | Admitting: Neurology

## 2019-11-19 DIAGNOSIS — R269 Unspecified abnormalities of gait and mobility: Secondary | ICD-10-CM

## 2019-11-19 DIAGNOSIS — R202 Paresthesia of skin: Secondary | ICD-10-CM

## 2019-11-19 DIAGNOSIS — Z0289 Encounter for other administrative examinations: Secondary | ICD-10-CM

## 2019-11-19 DIAGNOSIS — IMO0002 Reserved for concepts with insufficient information to code with codable children: Secondary | ICD-10-CM

## 2019-11-19 LAB — MULTIPLE MYELOMA PANEL, SERUM
Albumin SerPl Elph-Mcnc: 3.8 g/dL (ref 2.9–4.4)
Albumin/Glob SerPl: 1.3 (ref 0.7–1.7)
Alpha 1: 0.2 g/dL (ref 0.0–0.4)
Alpha2 Glob SerPl Elph-Mcnc: 0.8 g/dL (ref 0.4–1.0)
B-Globulin SerPl Elph-Mcnc: 1.1 g/dL (ref 0.7–1.3)
Gamma Glob SerPl Elph-Mcnc: 0.9 g/dL (ref 0.4–1.8)
Globulin, Total: 3 g/dL (ref 2.2–3.9)
IgA/Immunoglobulin A, Serum: 237 mg/dL (ref 87–352)
IgG (Immunoglobin G), Serum: 789 mg/dL (ref 586–1602)
IgM (Immunoglobulin M), Srm: 80 mg/dL (ref 26–217)
Total Protein: 6.8 g/dL (ref 6.0–8.5)

## 2019-11-19 LAB — RPR: RPR Ser Ql: NONREACTIVE

## 2019-11-19 LAB — TSH: TSH: 5.94 u[IU]/mL — ABNORMAL HIGH (ref 0.450–4.500)

## 2019-11-19 LAB — VITAMIN B12: Vitamin B-12: >2000 pg/mL — ABNORMAL HIGH (ref 232–1245)

## 2019-11-19 LAB — VITAMIN D 25 HYDROXY (VIT D DEFICIENCY, FRACTURES): Vit D, 25-Hydroxy: 55.3 ng/mL (ref 30.0–100.0)

## 2019-11-19 LAB — CK: Total CK: 92 U/L (ref 32–182)

## 2019-11-19 LAB — ANA W/REFLEX IF POSITIVE: Anti Nuclear Antibody (ANA): NEGATIVE

## 2019-11-19 LAB — C-REACTIVE PROTEIN: CRP: 2 mg/L (ref 0–10)

## 2019-11-19 LAB — FOLATE: Folate: >20 ng/mL (ref >3.0)

## 2019-11-19 LAB — HGB A1C W/O EAG: Hgb A1c MFr Bld: 6 % — ABNORMAL HIGH (ref 4.8–5.6)

## 2019-11-19 LAB — SEDIMENTATION RATE: Sed Rate: 9 mm/hr (ref 0–40)

## 2019-11-19 NOTE — Procedures (Signed)
Full Name: Jordan Mahoney Gender: Female MRN #: [id number] Date of Birth: [birth year]    Visit Date: 11/19/2019 07:27 Age: 69 Years Examining Physician: Marcial Pacas, MD  Referring Physician: Marcial Pacas, MD Height: 5 feet 6 inch History: 69 year old female, presented with bilateral lower extremity paresthesia  Summary of the test: Nerve conduction study: Bilateral sural, superficial peroneal sensory responses was within lower normal range.  Bilateral tibial, peroneal to EDB motor responses were normal.  Electromography: Selected needle examination of left lower extremity muscles were normal.  Conclusion: This is essentially a normal study.  There is no electrodiagnostic evidence of large fiber peripheral neuropathy.    ------------------------------- Marcial Pacas, M.D. PhD [facility] [address], [place] [address] Tel: [phone] Fax: [phone]  Verbal informed consent was obtained from the patient, patient was informed of potential risk of procedure, including bruising, bleeding, hematoma formation, infection, muscle weakness, muscle pain, numbness, among others.         [place]    Nerve / Sites Muscle Latency Ref. Amplitude Ref. Rel Amp Segments Distance Velocity Ref. Area    ms ms mV mV %  cm m/s m/s mVms  L Peroneal - EDB     Ankle EDB 4.3 ?6.5 4.1 ?2.0 100 Ankle - EDB 9   11.5     Fib head EDB 11.0  3.4  84.8 Fib head - Ankle 29 44 ?44 9.6     Pop fossa EDB 13.2  3.5  103 Pop fossa - Fib head 10 44 ?44 10.6         Pop fossa - Ankle      R Peroneal - EDB     Ankle EDB 4.9 ?6.5 3.4 ?2.0 100 Ankle - EDB 9   9.5     Fib head EDB 11.5  2.8  83 Fib head - Ankle 29 44 ?44 9.3     Pop fossa EDB 13.8  2.4  85.7 Pop fossa - Fib head 10 44 ?44 9.6         Pop fossa - Ankle      L Tibial - AH     Ankle AH 4.3 ?5.8 8.0 ?4.0 100 Ankle - AH 9   18.2     Pop fossa AH 13.2  6.3  79.4 Pop fossa - Ankle 37 41 ?41 16.9  R Tibial - AH     Ankle AH  4.0 ?5.8 11.7 ?4.0 100 Ankle - AH 9   21.2     Pop fossa AH 12.9  9.6  82.3 Pop fossa - Ankle 37 41 ?41 20.6             SNC    Nerve / Sites Rec. Site Peak Lat Ref.  Amp Ref. Segments Distance    ms ms V V  cm  L Sural - Ankle (Calf)     Calf Ankle 4.1 ?4.4 6 ?6 Calf - Ankle 14  R Sural - Ankle (Calf)     Calf Ankle 3.4 ?4.4 4 ?6 Calf - Ankle 14  L Superficial peroneal - Ankle     Lat leg Ankle 3.5 ?4.4 7 ?6 Lat leg - Ankle 14  R Superficial peroneal - Ankle     Lat leg Ankle 4.4 ?4.4 4 ?6 Lat leg - Ankle 14             F  Wave    Nerve F Lat Ref.   ms ms  L Tibial - AH 55.9 ?56.0  R Tibial - AH 54.6 ?56.0         EMG Summary Table    Spontaneous MUAP Recruitment  Muscle IA Fib PSW Fasc Other Amp Dur. Poly Pattern  L. Tibialis anterior Normal None None None _______ Normal Normal Normal Normal  L. Tibialis posterior Normal None None None _______ Normal Normal Normal Normal  L. Peroneus longus Normal None None None _______ Normal Normal Normal Normal  L. Vastus lateralis Normal None None None _______ Normal Normal Normal Normal  L. Abductor hallucis Normal None None None _______ Normal Normal Normal Normal

## 2019-12-06 DIAGNOSIS — J3081 Allergic rhinitis due to animal (cat) (dog) hair and dander: Secondary | ICD-10-CM | POA: Diagnosis not present

## 2019-12-06 NOTE — Progress Notes (Signed)
VIALS EXP 12-05-20 

## 2019-12-07 DIAGNOSIS — J3089 Other allergic rhinitis: Secondary | ICD-10-CM | POA: Diagnosis not present

## 2019-12-12 ENCOUNTER — Ambulatory Visit (INDEPENDENT_AMBULATORY_CARE_PROVIDER_SITE_OTHER): Payer: Medicare Other

## 2019-12-12 DIAGNOSIS — J309 Allergic rhinitis, unspecified: Secondary | ICD-10-CM | POA: Diagnosis not present

## 2019-12-13 HISTORY — PX: GLAUCOMA SURGERY: SHX656

## 2019-12-24 ENCOUNTER — Other Ambulatory Visit: Payer: Self-pay

## 2019-12-24 ENCOUNTER — Encounter: Payer: Self-pay | Admitting: Neurology

## 2019-12-24 ENCOUNTER — Ambulatory Visit (INDEPENDENT_AMBULATORY_CARE_PROVIDER_SITE_OTHER): Payer: Medicare Other | Admitting: Neurology

## 2019-12-24 VITALS — BP 121/80 | HR 82 | Ht 66.0 in | Wt 178.0 lb

## 2019-12-24 DIAGNOSIS — Z981 Arthrodesis status: Secondary | ICD-10-CM | POA: Diagnosis not present

## 2019-12-24 DIAGNOSIS — R202 Paresthesia of skin: Secondary | ICD-10-CM

## 2019-12-24 DIAGNOSIS — G43019 Migraine without aura, intractable, without status migrainosus: Secondary | ICD-10-CM | POA: Diagnosis not present

## 2019-12-24 NOTE — Patient Instructions (Signed)
Continue wean off Topamax as you are doing Continue other medications  Mention A1C was slightly elevated at 6.0, mention to PCP  See you back in 6 months

## 2019-12-24 NOTE — Progress Notes (Signed)
PATIENT: Jordan Mahoney DOB: 01/12/1951  REASON FOR VISIT: follow up HISTORY FROM: patient  HISTORY OF PRESENT ILLNESS: Today 12/24/19  HISTORY Jordan Mahoney is a 69 year old female, seen in request by her primary care nurse practitioner Ludwig Clarks for evaluation of low back pain, numbness tingling in feet   I reviewed and summarized the referring note.  Past medical history Chronic migraine headaches, Neck fusion in 2019, for bilateral upper extremity paresthesia and weakness, she also had mild gait abnormality, surgery did help her symptoms, but she still has residual bilateral hands paresthesia.  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  She is taking methotrexate for recurrent uveitis, is under good control, also reported a history of  She also has a long history of chronic migraine headaches, taking frequent Relpax as needed, which has helped her symptoms.  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  Realized, her Air Adjusted Bed was too firm, lowered the pressure 3 weeks ago symptoms are greatly improving.  No falls,  balance is stable.  Migraines: Doing well with Ajovy, continues to have headache with barometric pressure change, but is much less intense.  Has been weaning down Topamax taking 100 mg twice daily every other day, wants to be off.  Reserves Relpax for only severe headaches, if she has to go out.   On gabapentin 400 mg twice a day, for paresthesia to hands post cervical fusion surgery in May 2019 with Dr. Carloyn Manner.  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  Here today with her husband.  Labs at last visit showed elevated A1c 6.0, has been eating more candy.  TSH was elevated, endocrinologist has initiated Synthroid.  REVIEW OF SYSTEMS: Out of a complete 14 system review of symptoms, the patient complains only of the following symptoms, and all other reviewed systems are negative.  Headache  ALLERGIES: Allergies  Allergen Reactions  . Latex     Rash/hives  . Sulfa Antibiotics Other (See Comments)    Causes skin reaction ( on her mid back)  Pigmentation.   . Tape   . Valium [Diazepam]     Violent dreams    HOME MEDICATIONS: Outpatient Medications Prior to Visit  Medication Sig Dispense Refill  . azelastine (ASTELIN) 0.1 % nasal spray Place 2 sprays into both nostrils daily. 90 mL 3  . brimonidine (ALPHAGAN) 0.2 % ophthalmic solution Place 1 drop into the right eye 3 (three) times daily.    [name] CALCIUM CITRATE PO Take 500 mg by mouth 2 (two) times daily. Bariatric    . dorzolamide-timolol (COSOPT) 22.3-6.8 MG/ML ophthalmic solution Place 1 drop into the right eye 2 times daily.    [name] eletriptan (RELPAX) 40 MG tablet Take 1 tablet at the onset of migraine. May  repeat in 2 hours if headache persists or recurs. Not to exceed 2 tabs/24hours. 9 tablet 12  . Ergocalciferol (VITAMIN D2) 2000 UNITS TABS Take 2,000 Units by mouth daily.     . fexofenadine (ALLEGRA) 180 MG tablet Take 1 tablet (180 mg total) by mouth daily. (Patient taking differently: Take 180 mg by mouth 2 (two) times daily. ) 90 tablet 1   . folic acid (FOLVITE) 1 MG tablet Take 1 mg by mouth daily.     . Fremanezumab-vfrm (AJOVY) 225 MG/1.5ML SOAJ Inject 225 mg into the skin every 30 (thirty) days. 1 pen 11  . gabapentin (NEURONTIN) 400 MG capsule Take 1 capsule (400 mg total) by mouth 2 (two) times daily. 180 capsule 3  . levothyroxine (SYNTHROID) 50 MCG tablet Take 1 tablet (50 mcg total) by mouth daily before breakfast. 90 tablet 1  . Magnesium Oxide 400 MG CAPS Take 400 mg by mouth 2 (two) times daily.     . methotrexate (RHEUMATREX) 2.5 MG tablet As directed by doctor.    . mometasone (NASONEX) 50 MCG/ACT nasal spray Place 2 sprays into the nose daily. Two sprays each in each nostril 51 g 1  . Multiple Vitamin (MULTIVITAMIN) tablet Take 1 tablet by mouth 2 (two) times daily. Bariatric    . Netarsudil Dimesylate (RHOPRESSA) 0.02 % SOLN Apply to eye.    . Omega-3 Krill Oil 500 MG CAPS Take 1 capsule by mouth daily.     . Omeprazole (PRILOSEC PO) Take 20 mg by mouth in the morning and at bedtime.    . Riboflavin 100 MG CAPS Take 100 mg by mouth daily. VIT B2    . solifenacin (VESICARE) 10 MG tablet Take 10 mg by mouth daily.    [name] tiZANidine (ZANAFLEX) 4 MG capsule Take 4 mg by mouth 3 (three) times daily as needed for muscle spasms. Take 1 capsule (4 mg total) by mouth Three (3) times a day as needed    . topiramate (TOPAMAX) 100 MG tablet Take 1 tablet (100 mg total) by mouth 2 (two) times daily. 180 tablet 3  . vitamin C (ASCORBIC ACID) 500 MG tablet Take 500 mg by mouth 2 (two) times daily.     No facility-administered medications prior to visit.    PAST MEDICAL HISTORY: Past Medical History:  Diagnosis Date  . Allergic rhinoconjunctivitis   . Allergies   . Back pain   . Chronic uveitis   . Frequent UTI   . GERD (gastroesophageal reflux disease)   . HA (headache)   . Hearing loss    wearing bilateral aides  . Lactose intolerance   . Migraine   . Osteopenia   . Pre-diabetes   . 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  Sensorineural hearing loss   . Tingling of both feet   . Tinnitus     PAST SURGICAL HISTORY: Past Surgical History:  Procedure Laterality Date  . ABDOMINAL HYSTERECTOMY    . belly button    . c sections    . CATARACT EXTRACTION Right   . CHOLECYSTECTOMY    . COLON RESECTION N/A 04/17/2018   Procedure: LAPAROSCOPIC COLON RESECTION POSSIBLE OSTOMY;  Surgeon: Benjamine Sprague, DO;  Location: [facility] ORS;  Service: General;  Laterality: N/A;  . ELBOW SURGERY Left   . GANGLION CYST EXCISION Left 01/22/2016   base of thumb  . GASTRIC BYPASS    . HERNIA REPAIR    . neck fusion  07/04/2017   Dr. Carloyn Manner in [place]  .  SPINE SURGERY    . WRIST SURGERY Right     FAMILY HISTORY: Family History  Problem Relation Age of Onset  . Diabetes Father   . Stroke Father   . Squamous cell carcinoma Mother   . Migraines Daughter   . Breast cancer Paternal Aunt   . Allergic rhinitis Neg Hx   . Angioedema Neg Hx   . Asthma Neg Hx   . Eczema Neg Hx   . Immunodeficiency Neg Hx   . Urticaria Neg Hx     SOCIAL HISTORY: Social History   Socioeconomic History  . Marital status: Married    Spouse name: Not on file  . Number of children: 2  . Years of education: college  . Highest education level: Not on file  Occupational History    Comment: retired  Tobacco Use  . Smoking status: Former Smoker    Quit date: 1983    Years since quitting: 38.8  . Smokeless tobacco: Never Used  . Tobacco comment: Quit in 1985  Substance and Sexual Activity  . Alcohol use: Yes    Alcohol/week: 1.0 standard drink    Types: 1 Cans of beer per week    Comment: Rare   . Drug use: No  . Sexual activity: Not on file  Other Topics Concern  . Not on file  Social History Narrative   Patient lives at home with her husband [name]).    Education two years of college.   Caffeine - two glasses of tea.   Social Determinants of Health   Financial Resource Strain:   . Difficulty of Paying Living Expenses: Not on file  Food  Insecurity:   . Worried About Charity fundraiser in the Last Year: Not on file  . Ran Out of Food in the Last Year: Not on file  Transportation Needs:   . Lack of Transportation (Medical): Not on file  . Lack of Transportation (Non-Medical): Not on file  Physical Activity:   . Days of Exercise per Week: Not on file  . Minutes of Exercise per Session: Not on file  Stress:   . Feeling of Stress : Not on file  Social Connections:   . Frequency of Communication with Friends and Family: Not on file  . Frequency of Social Gatherings with Friends and Family: Not on file  . Attends Religious Services: Not on file  . Active Member of Clubs or Organizations: Not on file  . Attends Archivist Meetings: Not on file  . Marital Status: Not on file  Intimate Partner Violence:   . Fear of Current or Ex-Partner: Not on file  . Emotionally Abused: Not on file  . Physically Abused: Not on file  . Sexually Abused: Not on file   PHYSICAL EXAM  Vitals:   12/24/19 0836  BP: 121/80  Pulse: 82  Weight: 178 lb (80.7 kg)  Height: _0  (1.676 m)   Body mass index is 28.73 kg/m.  Generalized: Well developed, in no acute distress   Neurological examination  Mentation: Alert oriented to time, place, history taking. Follows all commands speech and language fluent Cranial nerve II-XII: Right pupil 4 mm, left 3 mm, right is post surgery. Extraocular movements were full, visual field were full on confrontational test. Facial sensation and strength were normal. Head turning and shoulder shrug  were normal and symmetric. Motor: The motor testing reveals 5 over 5 strength of all 4 extremities. Good symmetric motor tone is noted throughout.  Sensory: Sensory testing is intact to soft touch on all 4 extremities. No evidence of extinction is noted.  Coordination: Cerebellar testing reveals good finger-nose-finger and heel-to-shin bilaterally.  Gait and station: Gait is slightly wide-based, but  steady.  DIAGNOSTIC DATA (LABS, IMAGING, TESTING) - I reviewed patient records, labs, notes, testing and imaging myself where available.  Lab Results  Component Value Date   WBC 7.7 10/29/2019   HGB 12.6 10/29/2019   HCT 38.2 10/29/2019   MCV 93.4 10/29/2019   PLT 248 10/29/2019      Component Value Date/Time   NA 136 10/29/2019 1238   K 4.1 10/29/2019 1238   CL 102 10/29/2019 1238   CO2 24 10/29/2019 1238   GLUCOSE 105 (H) 10/29/2019 1238   BUN 11 10/29/2019 1238   CREATININE 0.85 10/29/2019 1238   CALCIUM 8.9 10/29/2019 1238   PROT 6.8 11/15/2019 1048   ALBUMIN 4.0 10/29/2019 1238   AST 25 10/29/2019 1238   ALT 24 10/29/2019 1238   ALKPHOS 87 10/29/2019 1238   BILITOT 0.9 10/29/2019 1238   GFRNONAA >60 10/29/2019 1238   GFRAA >60 10/29/2019 1238   No results found for: CHOL, HDL, LDLCALC, LDLDIRECT, TRIG, CHOLHDL Lab Results  Component Value Date   HGBA1C 6.0 (H) 11/15/2019   Lab Results  Component Value Date   VITAMINB12 >2000 (H) 11/15/2019   Lab Results  Component Value Date   TSH 5.940 (H) 11/15/2019      ASSESSMENT AND PLAN 68 y.o. 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 here with:  1.  New onset bilateral feet paresthesia 2.  History of cervical decompression surgery 3.  History of sigmoid colon resection in March 2020 -NCV/EMG in October 2021 was normal of LLE -CEA, CBC, CMP, B12, RPR, folate, CRP, CK, ESR, ANA, MM panel, Vit D, were unremarkable, A1c was elevated 6.0, TSH elevated 5.940 -Problem is essentially resolved, with bed adjustment -Continue gabapentin 400 mg twice a day -Keep an eye on A1c, is elevated 6.0 prediabetic, watch sugar  intake  4.  Chronic migraine headaches -Continue Ajovy 225 mg monthly injection for migraine prevention -Continue to wean down Topamax -Continue Relpax as needed for severe headaches -Follow-up in 6 months or sooner if needed  I spent 30 minutes of face-to-face and non-face-to-face time with patient.  This included previsit chart review, lab review, study review, order entry, electronic health record documentation, patient education.  Butler Denmark, AGNP-C, DNP 12/24/2019, 9:18 AM [facility] [address], [place], [place] [address] [phone]

## 2020-01-07 NOTE — Progress Notes (Signed)
I have reviewed and agreed above plan. 

## 2020-01-09 ENCOUNTER — Ambulatory Visit (INDEPENDENT_AMBULATORY_CARE_PROVIDER_SITE_OTHER): Payer: Medicare Other

## 2020-01-09 DIAGNOSIS — J309 Allergic rhinitis, unspecified: Secondary | ICD-10-CM

## 2020-02-06 ENCOUNTER — Ambulatory Visit (INDEPENDENT_AMBULATORY_CARE_PROVIDER_SITE_OTHER): Payer: Medicare Other

## 2020-02-06 DIAGNOSIS — J309 Allergic rhinitis, unspecified: Secondary | ICD-10-CM | POA: Diagnosis not present

## 2020-02-13 ENCOUNTER — Ambulatory Visit (INDEPENDENT_AMBULATORY_CARE_PROVIDER_SITE_OTHER): Payer: Medicare Other

## 2020-02-13 DIAGNOSIS — J309 Allergic rhinitis, unspecified: Secondary | ICD-10-CM

## 2020-02-13 LAB — THYROGLOBULIN ANTIBODY: Thyroglobulin Antibody: 4.1 IU/mL — ABNORMAL HIGH (ref 0.0–0.9)

## 2020-02-13 LAB — THYROID PEROXIDASE ANTIBODY: Thyroperoxidase Ab SerPl-aCnc: <8 IU/mL (ref 0–34)

## 2020-02-13 LAB — TSH: TSH: 1.91 u[IU]/mL (ref 0.450–4.500)

## 2020-02-13 LAB — T4, FREE: Free T4: 1.09 ng/dL (ref 0.82–1.77)

## 2020-02-18 ENCOUNTER — Encounter: Payer: Self-pay | Admitting: "Endocrinology

## 2020-02-18 ENCOUNTER — Other Ambulatory Visit: Payer: Self-pay

## 2020-02-18 ENCOUNTER — Ambulatory Visit (INDEPENDENT_AMBULATORY_CARE_PROVIDER_SITE_OTHER): Payer: Medicare Other | Admitting: "Endocrinology

## 2020-02-18 VITALS — BP 116/78 | HR 60 | Ht 66.0 in | Wt 185.2 lb

## 2020-02-18 DIAGNOSIS — E038 Other specified hypothyroidism: Secondary | ICD-10-CM

## 2020-02-18 DIAGNOSIS — R7303 Prediabetes: Secondary | ICD-10-CM | POA: Diagnosis not present

## 2020-02-18 DIAGNOSIS — E039 Hypothyroidism, unspecified: Secondary | ICD-10-CM | POA: Insufficient documentation

## 2020-02-18 DIAGNOSIS — E063 Autoimmune thyroiditis: Secondary | ICD-10-CM

## 2020-02-18 MED ORDER — LEVOTHYROXINE SODIUM 75 MCG PO TABS
75.0000 ug | ORAL_TABLET | Freq: Every day | ORAL | 1 refills | Status: DC
Start: 2020-02-18 — End: 2020-05-20

## 2020-02-18 NOTE — Progress Notes (Signed)
02/18/2020, 11:22 AM   Jordan Mahoney is a 70 y.o.-year-old female patient returning for follow-up after she was seen in consultation for hypothyroidism.    PCP:  Ludwig Clarks, FNP.   Past Medical History:  Diagnosis Date  . Allergic rhinoconjunctivitis   . Allergies   . Back pain   . Chronic uveitis   . Frequent UTI   . GERD (gastroesophageal reflux disease)   . HA (headache)   . Hearing loss    wearing bilateral aides  . Lactose intolerance   . Migraine   . Osteopenia   . Pre-diabetes   . 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 Sensorineural hearing loss   . Tingling of both feet   . Tinnitus     Past Surgical History:  Procedure Laterality Date  . ABDOMINAL HYSTERECTOMY    . belly button    . c sections    . CATARACT EXTRACTION Right   . CHOLECYSTECTOMY    . COLON RESECTION N/A 04/17/2018   Procedure: LAPAROSCOPIC COLON RESECTION POSSIBLE OSTOMY;  Surgeon: Benjamine Sprague, DO;  Location: [facility] ORS;  Service: General;  Laterality: N/A;  . ELBOW SURGERY Left   . GANGLION CYST EXCISION Left 01/22/2016   base of thumb  . GASTRIC BYPASS    . GLAUCOMA SURGERY    . HERNIA REPAIR    . neck fusion  07/04/2017   Dr. Carloyn Manner in [place]  . SPINE SURGERY    . WRIST SURGERY Right     Social History   Socioeconomic History  . Marital status: Married    Spouse name: Not on file  . Number of children: 2  . Years of education: college  . Highest education level: Not on file  Occupational History    Comment: retired  Tobacco Use  . Smoking status: Former Smoker    Quit date: 1983    Years since quitting: 39.0  . Smokeless tobacco: Never Used  . Tobacco comment: Quit in 1985  Substance and Sexual Activity  . Alcohol use: Yes    Alcohol/week: 1.0 standard drink    Types: 1 Cans of beer per week    Comment: Rare   . Drug use: No  . Sexual activity: Not on file  Other  Topics Concern  . Not on file  Social History Narrative   Patient lives at home with her husband [name]).    Education two years of college.   Caffeine - two glasses of tea.   Social Determinants of Health   Financial Resource Strain: Not on file  Food Insecurity: Not on file  Transportation Needs: Not on file  Physical Activity: Not on file  Stress: Not on file  Social Connections: Not on file    Family History  Problem Relation Age of Onset  . Diabetes Father   .  Stroke Father   . Squamous cell carcinoma Mother   . Migraines Daughter   . Breast cancer Paternal Aunt   . Allergic rhinitis Neg Hx   . Angioedema Neg Hx   . Asthma Neg Hx   . Eczema Neg Hx   . Immunodeficiency Neg Hx   . Urticaria Neg Hx     Outpatient Encounter Medications as of 02/18/2020  Medication Sig  . azelastine (ASTELIN) 0.1 % nasal spray Place 2 sprays into both nostrils daily.  . brimonidine (ALPHAGAN) 0.2 % ophthalmic solution Place 1 drop into the right eye 3 (three) times daily.  [name] CALCIUM CITRATE PO Take 500 mg by mouth 2 (two) times daily. Bariatric  . dorzolamide-timolol (COSOPT) 22.3-6.8 MG/ML ophthalmic solution Place 1 drop into the right eye 2 times daily.  [name] eletriptan (RELPAX) 40 MG tablet Take 1 tablet at the onset of migraine. May repeat in 2 hours if headache persists or recurs. Not to exceed 2 tabs/24hours.  . Ergocalciferol (VITAMIN D2) 2000 UNITS TABS Take 2,000 Units by mouth daily.   . fexofenadine (ALLEGRA) 180 MG tablet Take 1 tablet (180 mg total) by mouth daily. (Patient taking differently: Take 180 mg by mouth 2 (two) times daily. )  . folic acid (FOLVITE) 1 MG tablet Take 1 mg by mouth daily.   . Fremanezumab-vfrm (AJOVY) 225 MG/1.5ML SOAJ Inject 225 mg into the skin every 30 (thirty) days.  [name] gabapentin (NEURONTIN) 400 MG capsule Take 1 capsule (400 mg total) by mouth 2 (two) times daily.  [name] levothyroxine (SYNTHROID) 75 MCG tablet Take 1 tablet (75 mcg total) by mouth daily  before breakfast.  . Magnesium Oxide 400 MG CAPS Take 400 mg by mouth 2 (two) times daily.   . methotrexate (RHEUMATREX) 2.5 MG tablet As directed by doctor.  . mometasone (NASONEX) 50 MCG/ACT nasal spray Place 2 sprays into the nose daily. Two sprays each in each nostril  . Multiple Vitamin (MULTIVITAMIN) tablet Take 1 tablet by mouth 2 (two) times daily. Bariatric  . Netarsudil Dimesylate (RHOPRESSA) 0.02 % SOLN Apply to eye.  . Omega-3 Krill Oil 500 MG CAPS Take 1 capsule by mouth daily.   . Omeprazole (PRILOSEC PO) Take 20 mg by mouth in the morning and at bedtime.  . Riboflavin 100 MG CAPS Take 100 mg by mouth daily. VIT B2  . solifenacin (VESICARE) 10 MG tablet Take 10 mg by mouth daily.  [name] tiZANidine (ZANAFLEX) 4 MG capsule Take 4 mg by mouth 3 (three) times daily as needed for muscle spasms. Take 1 capsule (4 mg total) by mouth Three (3) times a day as needed  . vitamin C (ASCORBIC ACID) 500 MG tablet Take 500 mg by mouth 2 (two) times daily.  . [DISCONTINUED] levothyroxine (SYNTHROID) 50 MCG tablet Take 1 tablet (50 mcg total) by mouth daily before breakfast.  . [DISCONTINUED] topiramate (TOPAMAX) 100 MG tablet Take 1 tablet (100 mg total) by mouth 2 (two) times daily.   No facility-administered encounter medications on file as of 02/18/2020.    ALLERGIES: Allergies  Allergen Reactions  . Latex     Rash/hives  . Sulfa Antibiotics Other (See Comments)    Causes skin reaction ( on her mid back)  Pigmentation.   . Tape   . Valium [Diazepam]     Violent dreams   VACCINATION STATUS:  There is no immunization history on file for this patient.   HPI    Jordan Mahoney  is a patient with the  above medical history.  During her last visit, she was diagnosed with hypothyroidism and was initiated on levothyroxine.  She is currently on levothyroxine 50 mcg p.o. daily before breakfast.  She reports compliance with medication.  Her previsit labs are consistent with improvement in her  thyroid function profile.    -She reports improvement in her previous symptoms including  fatigue, cold intolerance.  She has other medical problems including iritis, dry skin, dry brittle nails.  She has family history of thyroid dysfunction in her mother and in her sibling.  She has no family history of thyroid malignancy.    Pt denies feeling nodules in neck, hoarseness, dysphagia/odynophagia, SOB with lying down.  No history of  radiation therapy to head or neck. No recent use of iodine supplements.  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   ROS:  Limited as above.   Physical Exam: BP 116/78   Pulse 60   Ht 5\' 6"  (1.676 m)   Wt 185 lb 3.2 oz (84 kg)   BMI 29.89 kg/m  Wt Readings from Last 3 Encounters:  02/18/20 185 lb 3.2 oz (84 kg)  12/24/19 178 lb (80.7 kg)  11/16/19 181 lb 8 oz (82.3 kg)    Constitutional:  Body mass index is 29.89 kg/m., not in acute distress, normal state of mind Eyes: PERRLA, EOMI, no exophthalmos ENT: moist mucous membranes, no thyromegaly, no cervical lymphadenopathy   CMP ( most recent) CMP     Component Value Date/Time   NA 136 10/29/2019 1238   K 4.1 10/29/2019 1238   CL 102 10/29/2019 1238   CO2 24 10/29/2019 1238   GLUCOSE 105 (H) 10/29/2019 1238   BUN 11 10/29/2019 1238   CREATININE 0.85 10/29/2019 1238   CALCIUM 8.9 10/29/2019 1238   PROT 6.8 11/15/2019 1048   ALBUMIN 4.0 10/29/2019 1238   AST 25 10/29/2019 1238   ALT 24 10/29/2019 1238   ALKPHOS 87 10/29/2019 1238   BILITOT 0.9 10/29/2019 1238   GFRNONAA >60 10/29/2019 1238   GFRAA >60 10/29/2019 1238     Diabetic Labs (most recent): Lab Results  Component Value Date   HGBA1C 6.0 (H) 11/15/2019     Lipid Panel ( most recent) Lipid Panel  No results found for: CHOL, TRIG, HDL, CHOLHDL, VLDL, LDLCALC, LDLDIRECT, LABVLDL     Lab Results  Component Value Date   TSH  1.910 02/12/2020   TSH 5.940 (H) 11/15/2019   FREET4 1.09 02/12/2020       ASSESSMENT: 1. Hypothyroidism 2.  Prediabetes  PLAN:  Her previsit thyroid function tests are such that she has benefited from thyroid hormone initiation, will need slight adjustment in her dose.  I discussed and increase her levothyroxine to 75 mcg p.o. daily before breakfast.   - We discussed about the correct intake of her thyroid hormone, on empty stomach at fasting, with water, separated by at least 30 minutes from breakfast and other medications,  and separated by more than 4 hours from calcium, iron, multivitamins, acid reflux medications (PPIs). -Patient is made aware of the fact that thyroid hormone replacement is needed for life, dose to be adjusted by periodic monitoring of thyroid function tests. Her labs also confirm Hashimoto's thyroiditis as a etiology for hypothyroidism.   -Due to absence of clinical goiter, no need for thyroid ultrasound. -Her previsit labs show A1c of 6%. She was never diagnosed and treated for diabetes,  likely indicating prediabetes.    - she acknowledges that there is a room for improvement in her food and drink choices. - Suggestion is made for her to avoid simple carbohydrates  from her diet including Cakes, Sweet Desserts, Ice Cream, Soda (diet and regular), Sweet Tea, Candies, Chips, Cookies, Store Bought Juices, Alcohol in Excess of  1-2 drinks a day, Artificial Sweeteners,  Coffee Creamer, and "Sugar-free" Products, Lemonade. This will help patient to have more stable blood glucose profile and potentially avoid unintended weight gain.  She is advised to maintain close follow-up with her PCP.      - Time spent on this patient care encounter:  30 minutes of which 50% was spent in  counseling and the rest reviewing  her current and  previous labs / studies and medications  doses and developing a plan for long term care. Claudette Head Anstine  participated in the discussions,  expressed understanding, and voiced agreement with the above plans.  All questions were answered to her satisfaction. she is encouraged to contact clinic should she have any questions or concerns prior to her return visit.   Return in about 3 months (around 05/18/2020) for F/U with Pre-visit Labs, A1c -NV.  Glade Lloyd, MD [facility] [address] [place], [place] [address] Phone: [phone]  Fax: [phone]   02/18/2020, 11:22 AM  This note was partially dictated with voice recognition software. Similar sounding words can be transcribed inadequately or may not  be corrected upon review.

## 2020-02-18 NOTE — Patient Instructions (Signed)

## 2020-02-20 ENCOUNTER — Ambulatory Visit (INDEPENDENT_AMBULATORY_CARE_PROVIDER_SITE_OTHER): Payer: Medicare Other

## 2020-02-20 DIAGNOSIS — J309 Allergic rhinitis, unspecified: Secondary | ICD-10-CM

## 2020-02-21 ENCOUNTER — Telehealth: Payer: Self-pay | Admitting: Neurology

## 2020-02-21 DIAGNOSIS — G43019 Migraine without aura, intractable, without status migrainosus: Secondary | ICD-10-CM

## 2020-02-21 MED ORDER — AJOVY 225 MG/1.5ML ~~LOC~~ SOAJ: 225.0000 mg | SUBCUTANEOUS | 11 refills | Status: DC

## 2020-02-21 NOTE — Telephone Encounter (Signed)
Pt request refill Fremanezumab-vfrm (AJOVY) 225 MG/1.5ML SOAJ at [facility]

## 2020-02-21 NOTE — Telephone Encounter (Signed)
Called patient and informed her Ajovy has refills through [facility]. She stated that [facility] told her it needed renewing because it is first of a new year. I advised will send in a new Rx. Patient verbalized understanding, appreciation.

## 2020-02-25 IMAGING — US US ABDOMEN LIMITED
1 series · 14 of 15 positions shown · non-contrast
Comparison: None.

CLINICAL DATA: Periumbilical abdominal pain, question ventral
hernia

EXAM:
ULTRASOUND ABDOMEN LIMITED

[Series 1: us abdomen limited · 0.09mm/px · 14 of 15 slices shown]
[im 1/15]
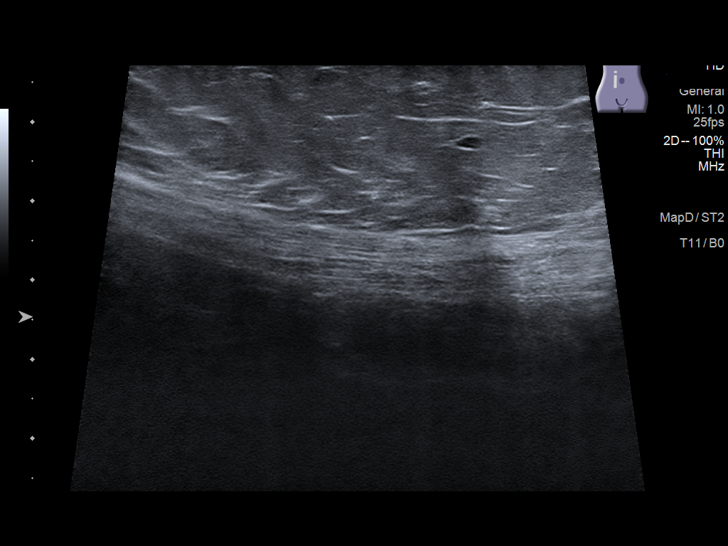
[im 2/15]
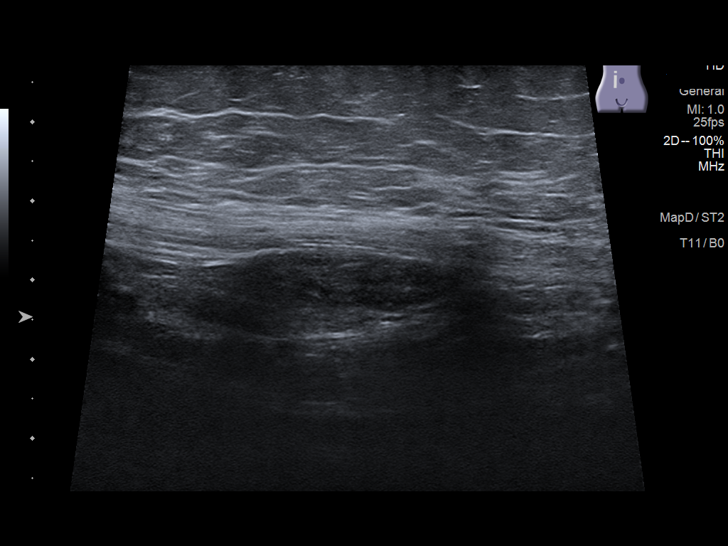
[im 3/15]
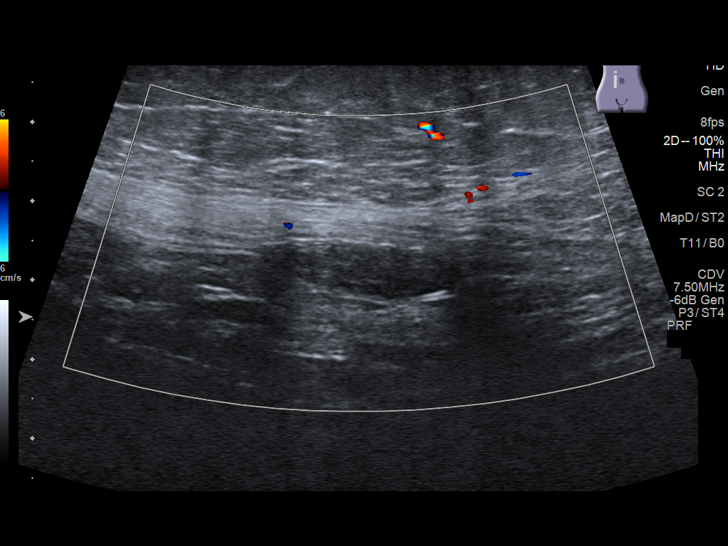
[im 4/15]
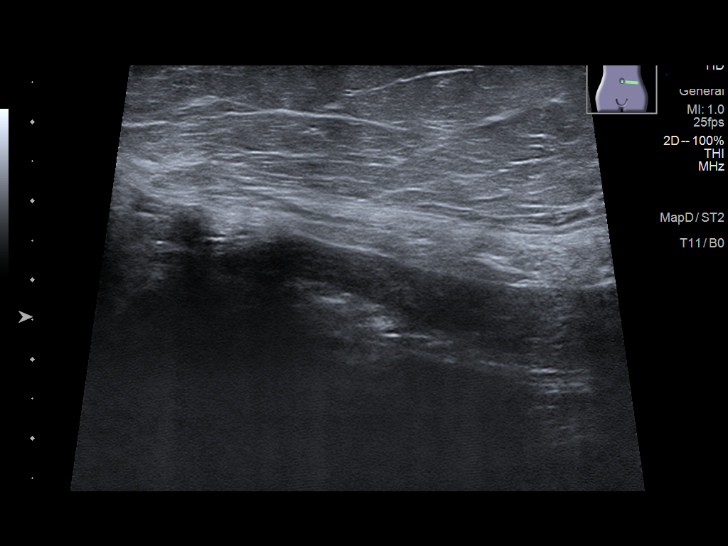
[im 5/15]
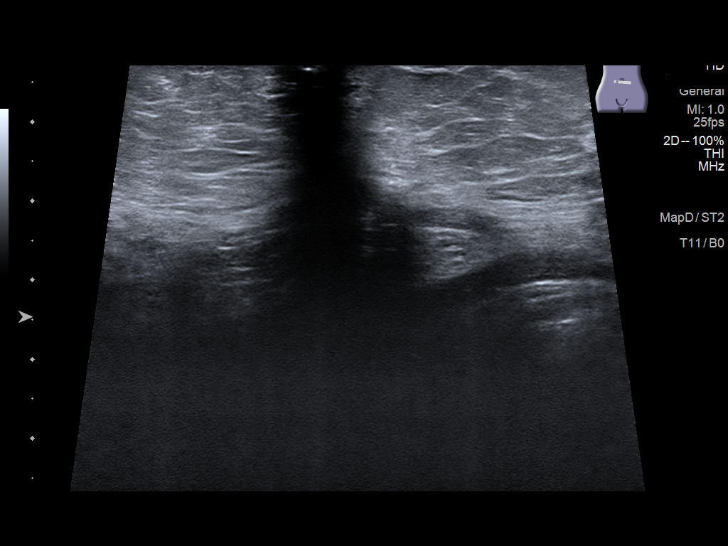
[im 6/15]
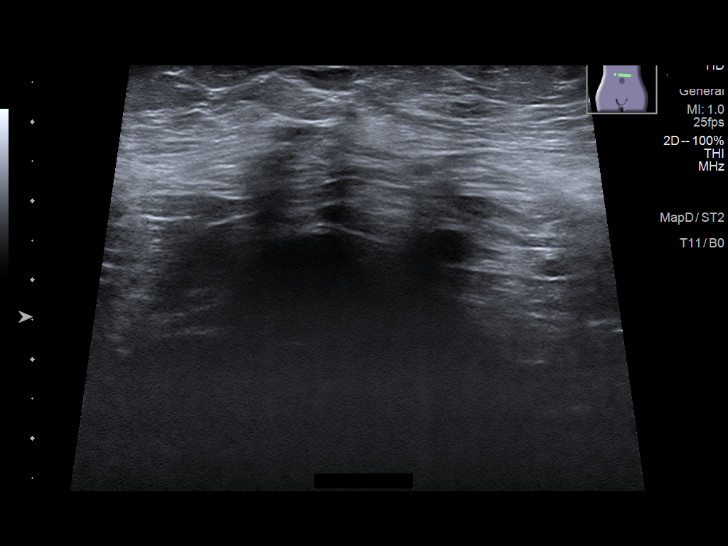
[im 7/15]
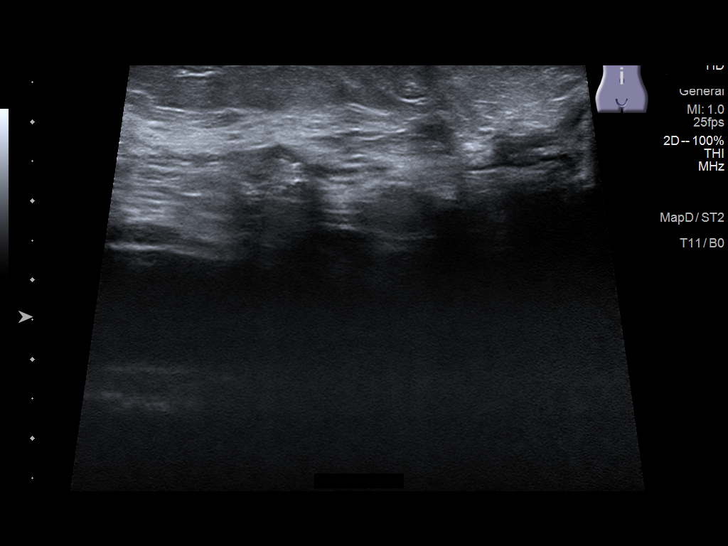
[im 9/15]
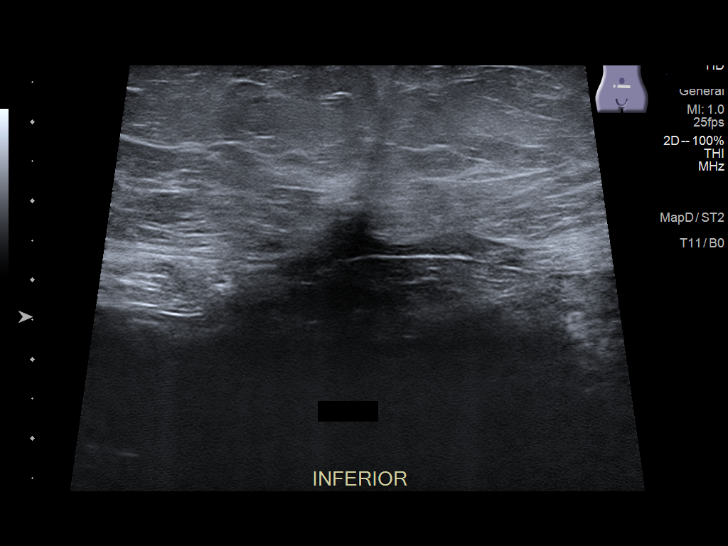
[im 10/15]
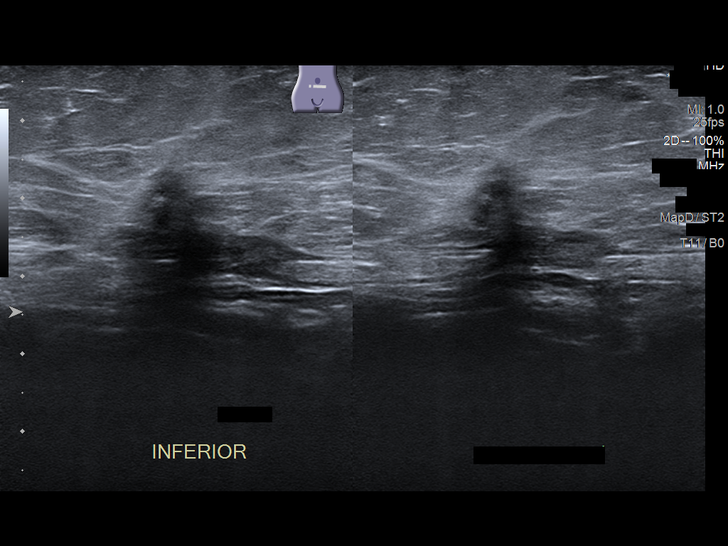
[im 11/15]
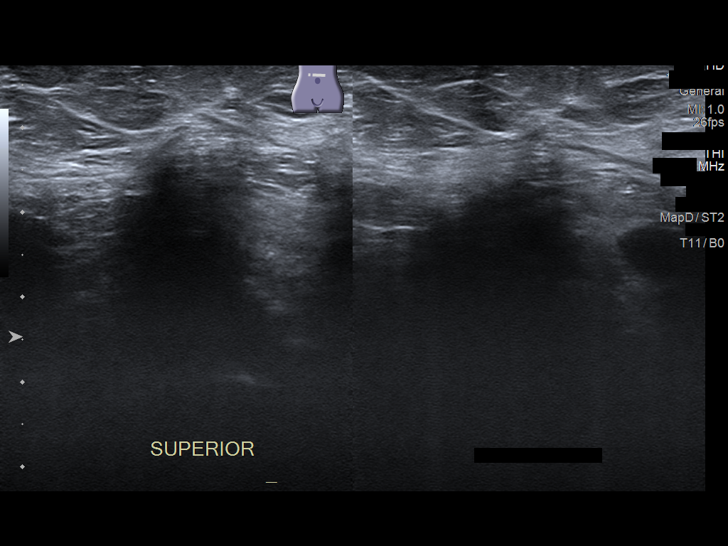
[im 12/15]
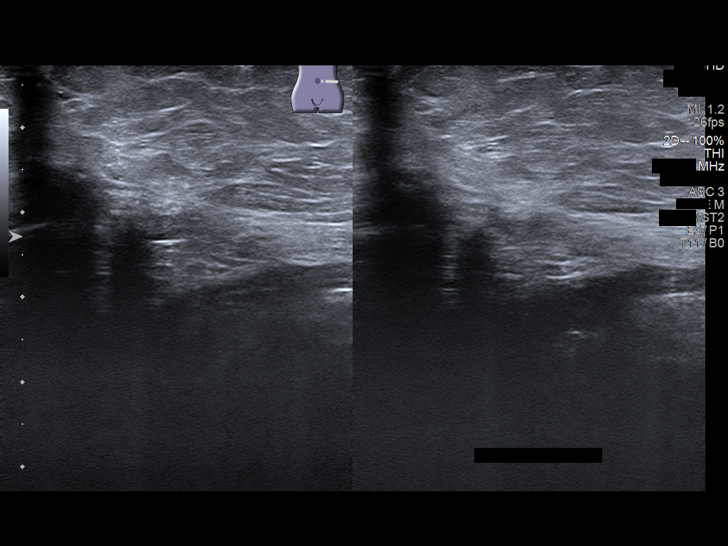
[im 13/15]
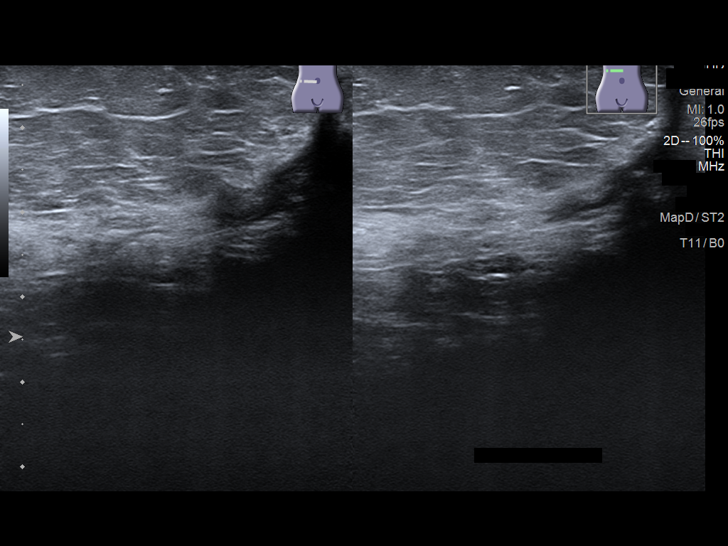
[im 14/15]
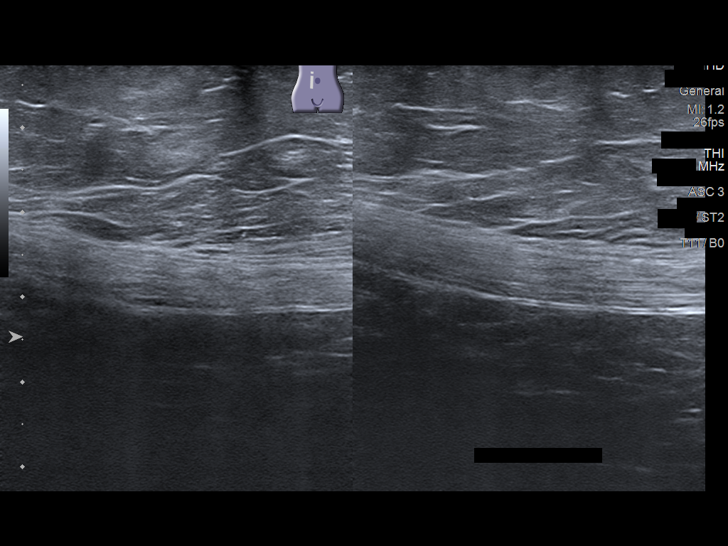
[im 15/15]
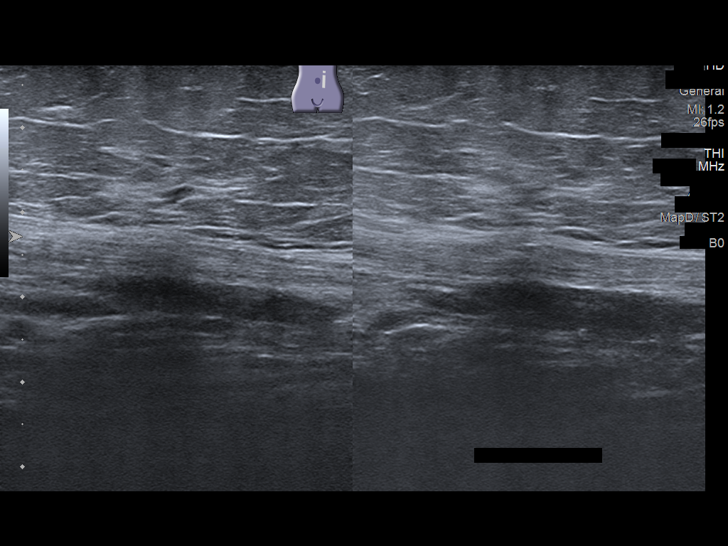

[14 of 15 positions shown; findings below may reference images not displayed]

FINDINGS: No visible hernia in the area imaged in the periumbilical region. No
soft tissue mass.
IMPRESSION: No visible soft tissue abnormality or hernia.

## 2020-02-27 ENCOUNTER — Ambulatory Visit (INDEPENDENT_AMBULATORY_CARE_PROVIDER_SITE_OTHER): Payer: Medicare Other

## 2020-02-27 DIAGNOSIS — J309 Allergic rhinitis, unspecified: Secondary | ICD-10-CM | POA: Diagnosis not present

## 2020-02-29 ENCOUNTER — Ambulatory Visit (INDEPENDENT_AMBULATORY_CARE_PROVIDER_SITE_OTHER): Payer: Medicare Other | Admitting: Allergy & Immunology

## 2020-02-29 ENCOUNTER — Encounter: Payer: Self-pay | Admitting: Allergy & Immunology

## 2020-02-29 ENCOUNTER — Other Ambulatory Visit: Payer: Self-pay

## 2020-02-29 VITALS — BP 118/78 | HR 57 | Temp 98.1°F | Resp 18 | Ht 66.93 in | Wt 184.8 lb

## 2020-02-29 DIAGNOSIS — K219 Gastro-esophageal reflux disease without esophagitis: Secondary | ICD-10-CM | POA: Diagnosis not present

## 2020-02-29 DIAGNOSIS — J302 Other seasonal allergic rhinitis: Secondary | ICD-10-CM | POA: Diagnosis not present

## 2020-02-29 DIAGNOSIS — J3089 Other allergic rhinitis: Secondary | ICD-10-CM

## 2020-02-29 DIAGNOSIS — J309 Allergic rhinitis, unspecified: Secondary | ICD-10-CM

## 2020-02-29 MED ORDER — MOMETASONE FUROATE 50 MCG/ACT NA SUSP
2.0000 | Freq: Every day | NASAL | 3 refills | Status: DC
Start: 2020-02-29 — End: 2020-03-10

## 2020-02-29 MED ORDER — AZELASTINE HCL 0.1 % NA SOLN: 2.0000 | Freq: Every day | NASAL | 3 refills | Status: DC

## 2020-02-29 MED ORDER — CYPROHEPTADINE HCL 4 MG PO TABS: 4.0000 mg | ORAL_TABLET | Freq: Every day | ORAL | 3 refills | Status: DC

## 2020-02-29 NOTE — Patient Instructions (Addendum)
1. Perennial and seasonal allergic rhinitis (molds, cat, dog, dust mite) - Continue with the allergy shots.  - We are adding on Periactin 4mg  at night (this can cause sleepiness and increased eating... watch for it).  - We sent in prescription for azelastine and Nasonex. - We will jump through whatever hoops are needed.   2. GERD - Continue with Prilosec.  3. Return in about 6 months (around 08/28/2020).   Please inform us of any Emergency Department visits, hospitalizations, or changes in symptoms. Call us before going to the ED for breathing or allergy symptoms since we might be able to fit you in for a sick visit. Feel free to contact us anytime with any questions, problems, or concerns.  It was a pleasure to see you again today!  Websites that have reliable patient information: 1. American Academy of Asthma, Allergy, and Immunology: www.aaaai.org 2. Food Allergy Research and Education (FARE): foodallergy.org 3. Mothers of Asthmatics: http://www.asthmacommunitynetwork.org 4. American College of Allergy, Asthma, and Immunology: www.acaai.org   COVID-19 Vaccine Information can be found at: ShippingScam.co.uk For questions related to vaccine distribution or appointments, please email vaccine@ .com or call [phone].     "Like" [place] on Facebook and Instagram for our latest updates!       Make sure you are registered to vote! If you have moved or changed any of your contact information, you will need to get this updated before voting!  In some cases, you MAY be able to register to vote online: [email or web address]

## 2020-02-29 NOTE — Progress Notes (Signed)
FOLLOW UP  Date of Service/Encounter:  02/29/20   Assessment:   Seasonal and perennial allergic rhinitis(molds, dust mites, cat and dog)- on allergen immunotherapy with recently re-mixed vials   Gastroesophageal reflux disease  Recent diagnosis of colon cancer - s/p partial resection  Recent diagnosis of Hashimoto's thyroiditis - sees Dr. Dorris Fetch  Plan/Recommendations:   1. Perennial and seasonal allergic rhinitis (molds, cat, dog, dust mite) - Continue with the allergy shots.  - We are adding on Periactin 4mg  at night (this can cause sleepiness and increased eating... watch for it).  - We sent in prescription for azelastine and Nasonex. - We will jump through whatever hoops are needed.   2. GERD - Continue with Prilosec.  3. Return in about 6 months (around 08/28/2020).    Subjective:   Jordan Mahoney is a 70 y.o. Mahoney presenting today for follow up of  Chief Complaint  Patient presents with  . Procedure    Seasonal and perennial allergic rhinitis    Jordan Mahoney has a history of the following: Patient Active Problem List   Diagnosis Date Noted  . Hypothyroidism 02/18/2020  . Prediabetes 02/18/2020  . History of fusion of cervical spine 12/24/2019  . Chronic migraine 11/15/2019  . Paresthesia 11/15/2019  . Gait abnormality 11/15/2019  . 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 Iron deficiency anemia due to chronic blood loss 04/04/2018  . Gastroesophageal reflux disease 05/03/2017  . Lactose intolerance 05/03/2017  . Allergic rhinitis 11/07/2014  . Headache, migraine 03/27/2014  . Migraine 12/11/2013  . Numbness 12/11/2013  . Urinary incontinence 12/11/2013  . HA (headache)     History obtained from: chart review and patient.  Jordan Mahoney is a 70 y.o. Mahoney presenting for a follow up visit. She was last seen in July 2021. At that time, we decided to remix her vials once she had used up her previous ones. We recommended restarting the  Nasonex and continued with Astelin. We also continued with Prilosec.  We actually ordered the remix of her vials in the middle of 2021 or earlier, but we have been waiting for her to use her already mixed red vial.  This did happen recently and she has restarted a new formulation.  We did not do any new testing, but it then makes different allergens together.  Since the last visit, she has mostly done well.   Allergic Rhinitis Symptom History: She remains on the Allegra daily. She has been sneezing more than previously. We apparently never sent in her nose sprays from the last visit, and she failed to tell this to [place] when she gave her her shots or give [place] a call.  She says she just wanted to see how she would do without them.  She thinks she does need them after all.  Her Allegra seems to be less efficacious.  She has previously been on Zyrtec, Xyzal, Claritin, and carbinoxamine without much relief.  It is unclear how much carbinoxamine she actually used since her insurance never covered it.  We did provide her with some samples once or twice, but she never used consistently since her insurance never covered it.  She has not been on antibiotics at all since the last visit.  She was recently diagnosed with Hashimoto's thyroiditis.  During this time, she has had increasing fatigue and weight gain.  She is on a thyroid supplement now and feeling much better.  She follows with Dr. Dorris Fetch.  Her colon cancer is completely cured.  She never needed chemotherapy at the extracted all of the margins.  She continues to follow with Dr. Reeves Dam for management of her hearing loss.  She has hearing aids in place.   She has received three COVID-19 vaccine.  She had no problems with booster at all.  Otherwise, there have been no changes to her past medical history, surgical history, family history, or social history.    Review of Systems  Constitutional: Negative.  Negative for chills, fever, malaise/fatigue and weight  loss.  HENT: Positive for congestion, ear pain and tinnitus. Negative for ear discharge and sinus pain.   Eyes: Negative for pain, discharge and redness.  Respiratory: Negative for cough, sputum production, shortness of breath and wheezing.   Cardiovascular: Negative.  Negative for chest pain and palpitations.  Gastrointestinal: Negative for abdominal pain, constipation, diarrhea, heartburn, nausea and vomiting.  Skin: Negative.  Negative for itching and rash.  Neurological: Negative for dizziness and headaches.  Endo/Heme/Allergies: Positive for environmental allergies. Does not bruise/bleed easily.       Objective:   Blood pressure 118/78, pulse (!) 57, temperature 98.1 F (36.7 C), temperature source Temporal, resp. rate 18, height 5' 6.93" (1.7 m), weight 184 lb 12.8 oz (83.8 kg), SpO2 97 %. Body mass index is 29.01 kg/m.   Physical Exam:  Physical Exam Constitutional:      Appearance: She is well-developed.  HENT:     Head: Normocephalic and atraumatic.     Right Ear: Tympanic membrane, ear canal and external ear normal.     Left Ear: Tympanic membrane, ear canal and external ear normal.     Ears:     Comments: Hearing aids in place.    Nose: No nasal deformity, septal deviation, mucosal edema, rhinorrhea or epistaxis.     Right Turbinates: Enlarged, swollen and pale.     Left Turbinates: Enlarged, swollen and pale.     Right Sinus: No maxillary sinus tenderness or frontal sinus tenderness.     Left Sinus: No maxillary sinus tenderness or frontal sinus tenderness.     Comments: No polyps present.     Mouth/Throat:     Mouth: Oropharynx is clear and moist. Mucous membranes are not pale and not dry.     Pharynx: Uvula midline.  Eyes:     General:        Right eye: No discharge.        Left eye: No discharge.     Extraocular Movements: EOM normal.     Conjunctiva/sclera: Conjunctivae normal.     Right eye: Right conjunctiva is not injected. No chemosis.    Left eye:  Left conjunctiva is not injected. No chemosis.    Pupils: Pupils are equal, round, and reactive to light.  Cardiovascular:     Rate and Rhythm: Normal rate and regular rhythm.     Heart sounds: Normal heart sounds.  Pulmonary:     Effort: Pulmonary effort is normal. No tachypnea, accessory muscle usage or respiratory distress.     Breath sounds: Normal breath sounds. No wheezing, rhonchi or rales.  Chest:     Chest wall: No tenderness.  Lymphadenopathy:     Cervical: No cervical adenopathy.  Skin:    General: Skin is warm.     Capillary Refill: Capillary refill takes less than 2 seconds.     Coloration: Skin is not pale.     Findings: No abrasion, erythema, petechiae or rash. Rash is not papular, urticarial or vesicular.  Neurological:  Mental Status: She is alert.  Psychiatric:        Mood and Affect: Mood and affect normal.      Diagnostic studies: none     Salvatore Marvel, MD  [facility] [place] [facility] [place] [place]

## 2020-03-05 ENCOUNTER — Ambulatory Visit (INDEPENDENT_AMBULATORY_CARE_PROVIDER_SITE_OTHER): Payer: Medicare Other

## 2020-03-05 DIAGNOSIS — J309 Allergic rhinitis, unspecified: Secondary | ICD-10-CM | POA: Diagnosis not present

## 2020-03-07 ENCOUNTER — Telehealth: Payer: Self-pay | Admitting: *Deleted

## 2020-03-07 NOTE — Telephone Encounter (Signed)
The patient has [employer]/ID#[id number]). I called and spoke to rep, Alania, to complete this PA over the phone since the previous case started had expired. PA was approved indefinitely. Case [id number].

## 2020-03-10 ENCOUNTER — Other Ambulatory Visit: Payer: Self-pay

## 2020-03-10 DIAGNOSIS — J3089 Other allergic rhinitis: Secondary | ICD-10-CM

## 2020-03-10 DIAGNOSIS — J302 Other seasonal allergic rhinitis: Secondary | ICD-10-CM

## 2020-03-10 MED ORDER — OMEPRAZOLE 20 MG PO CPDR: 20.0000 mg | DELAYED_RELEASE_CAPSULE | Freq: Two times a day (BID) | ORAL | 5 refills | Status: DC

## 2020-03-10 MED ORDER — AZELASTINE HCL 0.1 % NA SOLN: 2.0000 | Freq: Every day | NASAL | 3 refills | Status: DC

## 2020-03-10 MED ORDER — MOMETASONE FUROATE 50 MCG/ACT NA SUSP: 2.0000 | Freq: Every day | NASAL | 3 refills | Status: DC

## 2020-03-10 MED ORDER — CYPROHEPTADINE HCL 4 MG PO TABS: 4.0000 mg | ORAL_TABLET | Freq: Every day | ORAL | 3 refills | Status: DC

## 2020-03-10 NOTE — Progress Notes (Signed)
Patient called and needed her prescriptions switched to a new pharmacy. All medications have been switched.

## 2020-03-10 NOTE — Addendum Note (Signed)
Addended by: Herbie Drape on: 03/10/2020 01:15 PM   Modules accepted: Orders

## 2020-03-12 ENCOUNTER — Ambulatory Visit (INDEPENDENT_AMBULATORY_CARE_PROVIDER_SITE_OTHER): Payer: Medicare Other

## 2020-03-12 DIAGNOSIS — J309 Allergic rhinitis, unspecified: Secondary | ICD-10-CM | POA: Diagnosis not present

## 2020-03-19 ENCOUNTER — Ambulatory Visit (INDEPENDENT_AMBULATORY_CARE_PROVIDER_SITE_OTHER): Payer: Medicare Other

## 2020-03-19 DIAGNOSIS — J309 Allergic rhinitis, unspecified: Secondary | ICD-10-CM | POA: Diagnosis not present

## 2020-03-26 ENCOUNTER — Ambulatory Visit (INDEPENDENT_AMBULATORY_CARE_PROVIDER_SITE_OTHER): Payer: Medicare Other

## 2020-03-26 DIAGNOSIS — J309 Allergic rhinitis, unspecified: Secondary | ICD-10-CM | POA: Diagnosis not present

## 2020-04-02 ENCOUNTER — Ambulatory Visit (INDEPENDENT_AMBULATORY_CARE_PROVIDER_SITE_OTHER): Payer: Medicare Other

## 2020-04-02 DIAGNOSIS — J309 Allergic rhinitis, unspecified: Secondary | ICD-10-CM | POA: Diagnosis not present

## 2020-04-28 ENCOUNTER — Other Ambulatory Visit: Payer: Self-pay

## 2020-04-28 ENCOUNTER — Inpatient Hospital Stay: Payer: Medicare Other | Attending: Oncology

## 2020-04-28 ENCOUNTER — Other Ambulatory Visit: Payer: Self-pay | Admitting: Oncology

## 2020-04-28 ENCOUNTER — Ambulatory Visit
Admission: RE | Admit: 2020-04-28 | Discharge: 2020-04-28 | Disposition: A | Discharge status: Home / Self Care | Payer: Medicare Other | Source: Ambulatory Visit | Attending: Oncology | Admitting: Oncology

## 2020-04-28 DIAGNOSIS — Z85038 Personal history of other malignant neoplasm of large intestine: Secondary | ICD-10-CM | POA: Insufficient documentation

## 2020-04-28 DIAGNOSIS — C187 Malignant neoplasm of sigmoid colon: Secondary | ICD-10-CM

## 2020-04-28 DIAGNOSIS — R918 Other nonspecific abnormal finding of lung field: Secondary | ICD-10-CM | POA: Insufficient documentation

## 2020-04-28 DIAGNOSIS — E063 Autoimmune thyroiditis: Secondary | ICD-10-CM | POA: Insufficient documentation

## 2020-04-28 LAB — COMPREHENSIVE METABOLIC PANEL
ALT: 35 U/L (ref 0–44)
AST: 42 U/L — ABNORMAL HIGH (ref 15–41)
Albumin: 3.8 g/dL (ref 3.5–5.0)
Alkaline Phosphatase: 93 U/L (ref 38–126)
Anion gap: 7 (ref 5–15)
BUN: 8 mg/dL (ref 8–23)
CO2: 25 mmol/L (ref 22–32)
Calcium: 8.3 mg/dL — ABNORMAL LOW (ref 8.9–10.3)
Chloride: 104 mmol/L (ref 98–111)
Creatinine, Ser: 0.74 mg/dL (ref 0.44–1.00)
GFR, Estimated: >60 mL/min (ref >60)
Glucose, Bld: 119 mg/dL — ABNORMAL HIGH (ref 70–99)
Potassium: 3.6 mmol/L (ref 3.5–5.1)
Sodium: 136 mmol/L (ref 135–145)
Total Bilirubin: 1 mg/dL (ref 0.3–1.2)
Total Protein: 6.6 g/dL (ref 6.5–8.1)

## 2020-04-28 LAB — CBC WITH DIFFERENTIAL/PLATELET
Abs Immature Granulocytes: 0.01 10*3/uL (ref 0.00–0.07)
Basophils Absolute: 0.1 10*3/uL (ref 0.0–0.1)
Basophils Relative: 1 %
Eosinophils Absolute: 0.2 10*3/uL (ref 0.0–0.5)
Eosinophils Relative: 3 %
HCT: 38.1 % (ref 36.0–46.0)
Hemoglobin: 12.3 g/dL (ref 12.0–15.0)
Immature Granulocytes: 0 %
Lymphocytes Relative: 39 %
Lymphs Abs: 2.4 10*3/uL (ref 0.7–4.0)
MCH: 30.1 pg (ref 26.0–34.0)
MCHC: 32.3 g/dL (ref 30.0–36.0)
MCV: 93.4 fL (ref 80.0–100.0)
Monocytes Absolute: 0.6 10*3/uL (ref 0.1–1.0)
Monocytes Relative: 10 %
Neutro Abs: 2.9 10*3/uL (ref 1.7–7.7)
Neutrophils Relative %: 47 %
Platelets: 228 10*3/uL (ref 150–400)
RBC: 4.08 MIL/uL (ref 3.87–5.11)
RDW: 13.6 % (ref 11.5–15.5)
WBC: 6.2 10*3/uL (ref 4.0–10.5)
nRBC: 0 % (ref 0.0–0.2)

## 2020-04-28 MED ORDER — IOHEXOL 300 MG/ML  SOLN
100.0000 mL | Freq: Once | INTRAMUSCULAR | Status: AC | PRN
  Administered 2020-04-28: 100 mL via INTRAVENOUS

## 2020-04-29 ENCOUNTER — Other Ambulatory Visit: Payer: Medicare Other

## 2020-04-29 LAB — CEA: CEA: 1.8 ng/mL (ref 0.0–4.7)

## 2020-04-30 ENCOUNTER — Ambulatory Visit (INDEPENDENT_AMBULATORY_CARE_PROVIDER_SITE_OTHER): Payer: Medicare Other | Admitting: *Deleted

## 2020-04-30 DIAGNOSIS — J309 Allergic rhinitis, unspecified: Secondary | ICD-10-CM

## 2020-05-01 ENCOUNTER — Encounter: Payer: Self-pay | Admitting: Oncology

## 2020-05-01 ENCOUNTER — Inpatient Hospital Stay (HOSPITAL_BASED_OUTPATIENT_CLINIC_OR_DEPARTMENT_OTHER): Payer: Medicare Other | Admitting: Oncology

## 2020-05-01 ENCOUNTER — Other Ambulatory Visit: Payer: Medicare Other

## 2020-05-01 VITALS — BP 124/79 | HR 55 | Temp 97.3°F | Resp 18 | Wt 191.4 lb

## 2020-05-01 DIAGNOSIS — Z85038 Personal history of other malignant neoplasm of large intestine: Secondary | ICD-10-CM | POA: Diagnosis not present

## 2020-05-01 DIAGNOSIS — C187 Malignant neoplasm of sigmoid colon: Secondary | ICD-10-CM

## 2020-05-01 DIAGNOSIS — E063 Autoimmune thyroiditis: Secondary | ICD-10-CM | POA: Diagnosis not present

## 2020-05-01 DIAGNOSIS — R918 Other nonspecific abnormal finding of lung field: Secondary | ICD-10-CM | POA: Diagnosis not present

## 2020-05-01 NOTE — Progress Notes (Signed)
Hematology/Oncology follow up  [facility] [phone] Fax:([phone]   Patient Care Team: Ludwig Clarks, FNP as PCP - General (Family Medicine) Dorthula Rue., MD as Referring Physician (Otolaryngology) Clent Jacks, RN as Registered Nurse  REFERRING PROVIDER: Dr.Toledo / Gerarda Gunther 'CHIEF COMPLAINTS/REASON FOR VISIT:  Follow up Stage IIA colorectal cancer  HISTORY OF PRESENTING ILLNESS:  Jordan Mahoney is a  70 y.o.  female with PMH listed below who was referred to me for evaluation of stage IIA colorectal cancer. Patient was initially seen care physician for evaluation of fatigue.  Lab work-up showed that patient's anemic.  Hemoccult was obtained and was positive.  Patient was referred to [facility] and was seen and evaluated by Dr. Alice Reichert.  Reports that she had 2-3 bowel movement daily sometimes mushy and sometimes formed.  Denies seeing any bright red blood in the stool.  Denies any associated abdominal pain, gastric discomfort.  Appetite seems to be stable.  History of gastric bypass in 2010.  Cholecystectomy in 2000.  ?Unintentional weight loss 5 to 6 pounds for the past 2 months.   prior records showed she weighs 149 pounds on 09/28/2017, weighed 160 pounds 05/03/2017 Today she weighs 153 pounds Colonoscopy was obtained on 03/27/2018.  6 mm polyp found in sigmoid colon.  Polyp is sessile. An ulcerated partially obstructing mass was found in the rectosigmoid colon.  Mass was circumferential.  Measured 5 cm in length.  No bleeding was present.  Biopsy was taken.  Nonbleeding internal hemorrhoids were found during retroflexion.  # underwent surgery.  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 No loss of nuclear expression of MMR proteins: Low probability of MSI-H.  Stage IIA, No adjuvant chemotherapy was not  Offered  INTERVAL  HISTORY Jordan Mahoney is a 70 y.o. female who has above history reviewed by me today presents for follow up visit for management of Stage IIA colorectal cancer. Problems and complaints are listed below: She reports doing well, no new compliants.   Patient has been diagnosed with Hashimoto thyroiditis.  Started on levothyroxine. Otherwise no new complaints Denies any black or bloody stool Appetite is good.  Review of Systems  Constitutional: Negative for appetite change, chills, fatigue and fever.  HENT:   Negative for hearing loss and voice change.   Eyes: Negative for eye problems.  Respiratory: Negative for chest tightness and cough.   Cardiovascular: Negative for chest pain.  Gastrointestinal: Negative for abdominal distention, abdominal pain and blood in stool.  Endocrine: Negative for hot flashes.  Genitourinary: Negative for difficulty urinating and frequency.        Bladder spasm  Musculoskeletal: Negative for arthralgias.  Skin: Negative for itching and rash.  Neurological: Negative for extremity weakness.  Hematological: Negative for adenopathy.  Psychiatric/Behavioral: Negative for confusion. The patient is not nervous/anxious.     MEDICAL HISTORY:  Past Medical History:  Diagnosis Date  . Allergic rhinoconjunctivitis   . Allergies   . Back pain   . Chronic uveitis   . Frequent UTI   . GERD (gastroesophageal reflux disease)   . HA (headache)   . Hearing loss    wearing bilateral aides  . Lactose intolerance   . Migraine   . Osteopenia   . Pre-diabetes   . 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 Sensorineural hearing loss   . Tingling of both feet   . Tinnitus  SURGICAL HISTORY: Past Surgical History:  Procedure Laterality Date  . ABDOMINAL HYSTERECTOMY    . belly button    . c sections    . CATARACT EXTRACTION Right   . CHOLECYSTECTOMY    . COLON RESECTION N/A 04/17/2018   Procedure: LAPAROSCOPIC COLON RESECTION POSSIBLE OSTOMY;  Surgeon: Benjamine Sprague, DO;   Location: [facility] ORS;  Service: General;  Laterality: N/A;  . ELBOW SURGERY Left   . GANGLION CYST EXCISION Left 01/22/2016   base of thumb  . GASTRIC BYPASS    . GLAUCOMA SURGERY    . GLAUCOMA SURGERY  12/13/2019   right eye  . HERNIA REPAIR    . neck fusion  07/04/2017   Dr. Carloyn Manner in [place]  . SPINE SURGERY    . WRIST SURGERY Right     SOCIAL HISTORY: Social History   Socioeconomic History  . Marital status: Married    Spouse name: Not on file  . Number of children: 2  . Years of education: college  . Highest education level: Not on file  Occupational History    Comment: retired  Tobacco Use  . Smoking status: Former Smoker    Quit date: 1983    Years since quitting: 39.2  . Smokeless tobacco: Never Used  . Tobacco comment: Quit in 1985  Substance and Sexual Activity  . Alcohol use: Yes    Alcohol/week: 1.0 standard drink    Types: 1 Cans of beer per week    Comment: Rare   . Drug use: No  . Sexual activity: Not on file  Other Topics Concern  . Not on file  Social History Narrative   Patient lives at home with her husband [name]).    Education two years of college.   Caffeine - two glasses of tea.   Social Determinants of Health   Financial Resource Strain: Not on file  Food Insecurity: Not on file  Transportation Needs: Not on file  Physical Activity: Not on file  Stress: Not on file  Social Connections: Not on file  Intimate Partner Violence: Not on file    FAMILY HISTORY: Family History  Problem Relation Age of Onset  . Diabetes Father   . Stroke Father   . Squamous cell carcinoma Mother   . Migraines Daughter   . Breast cancer Paternal Aunt   . Allergic rhinitis Neg Hx   . Angioedema Neg Hx   . Asthma Neg Hx   . Eczema Neg Hx   . Immunodeficiency Neg Hx   . Urticaria Neg Hx     ALLERGIES:  is allergic to latex, sulfa antibiotics, tape, and valium [diazepam].  MEDICATIONS:  Current Outpatient Medications  Medication Sig Dispense Refill  .  azelastine (ASTELIN) 0.1 % nasal spray Place 2 sprays into both nostrils daily. 90 mL 3  . brimonidine (ALPHAGAN) 0.2 % ophthalmic solution Place 1 drop into the right eye 3 (three) times daily.    [name] CALCIUM CITRATE PO Take 500 mg by mouth 2 (two) times daily. Bariatric    . cyproheptadine (PERIACTIN) 4 MG tablet Take 1 tablet (4 mg total) by mouth at bedtime. 30 tablet 3  . dorzolamide-timolol (COSOPT) 22.3-6.8 MG/ML ophthalmic solution Place 1 drop into the right eye 2 times daily.    [name] eletriptan (RELPAX) 40 MG tablet Take 1 tablet at the onset of migraine. May repeat in 2 hours if headache persists or recurs. Not to exceed 2 tabs/24hours. 9 tablet 12  . Ergocalciferol (VITAMIN D2) 2000  UNITS TABS Take 2,000 Units by mouth daily.     . fexofenadine (ALLEGRA) 180 MG tablet Take 1 tablet (180 mg total) by mouth daily. (Patient taking differently: Take 180 mg by mouth 2 (two) times daily.) 90 tablet 1  . folic acid (FOLVITE) 1 MG tablet Take 1 mg by mouth daily.     . Fremanezumab-vfrm (AJOVY) 225 MG/1.5ML SOAJ Inject 225 mg into the skin every 30 (thirty) days. 1.5 mL 11  . gabapentin (NEURONTIN) 400 MG capsule Take 1 capsule (400 mg total) by mouth 2 (two) times daily. 180 capsule 3  . levothyroxine (SYNTHROID) 75 MCG tablet Take 1 tablet (75 mcg total) by mouth daily before breakfast. 90 tablet 1  . Magnesium Oxide 400 MG CAPS Take 400 mg by mouth 2 (two) times daily.     . methotrexate (RHEUMATREX) 2.5 MG tablet As directed by doctor.    . Multiple Vitamin (MULTIVITAMIN) tablet Take 1 tablet by mouth 2 (two) times daily. Bariatric    . Omega-3 Krill Oil 500 MG CAPS Take 1 capsule by mouth daily.     [name] omeprazole (PRILOSEC) 20 MG capsule Take 1 capsule (20 mg total) by mouth in the morning and at bedtime. 60 capsule 5  . Riboflavin 100 MG CAPS Take 100 mg by mouth daily. VIT B2    . solifenacin (VESICARE) 10 MG tablet Take 10 mg by mouth daily.    [name] tiZANidine (ZANAFLEX) 4 MG capsule Take 4 mg  by mouth 3 (three) times daily as needed for muscle spasms. Take 1 capsule (4 mg total) by mouth Three (3) times a day as needed    . vitamin C (ASCORBIC ACID) 500 MG tablet Take 500 mg by mouth 2 (two) times daily.    . solifenacin (VESICARE) 10 MG tablet Vesicare (Patient not taking: Reported on 05/01/2020)     No current facility-administered medications for this visit.     PHYSICAL EXAMINATION: ECOG PERFORMANCE STATUS: 1 - Symptomatic but completely ambulatory Vitals:   05/01/20 0958  BP: 124/79  Pulse: (!) 55  Resp: 18  Temp: (!) 97.3 F (36.3 C)   Filed Weights   05/01/20 0958  Weight: 191 lb 6.4 oz (86.8 kg)    Physical Exam Constitutional:      General: She is not in acute distress. HENT:     Head: Normocephalic and atraumatic.  Eyes:     General: No scleral icterus.    Pupils: Pupils are equal, round, and reactive to light.  Cardiovascular:     Rate and Rhythm: Normal rate and regular rhythm.     Heart sounds: Normal heart sounds.  Pulmonary:     Effort: Pulmonary effort is normal. No respiratory distress.     Breath sounds: No wheezing.  Abdominal:     General: Bowel sounds are normal. There is no distension.     Palpations: Abdomen is soft. There is no mass.     Tenderness: There is no abdominal tenderness.  Musculoskeletal:        General: No deformity. Normal range of motion.     Cervical back: Normal range of motion and neck supple.  Skin:    General: Skin is warm and dry.     Findings: No erythema or rash.  Neurological:     Mental Status: She is alert and oriented to person, place, and time.     Cranial Nerves: No cranial nerve deficit.     Coordination: Coordination normal.  Psychiatric:  Behavior: Behavior normal.        Thought Content: Thought content normal.     RADIOGRAPHIC STUDIES: I have personally reviewed the radiological images as listed and agreed with the findings in the report. CT CHEST ABDOMEN PELVIS W CONTRAST  Result  Date: 04/28/2020 CLINICAL DATA:  Sigmoid colon cancer status post surgical resection March 2020. Restaging. Additional history of gastric bypass surgery, cholecystectomy and hysterectomy. EXAM: CT CHEST, ABDOMEN, AND PELVIS WITH CONTRAST TECHNIQUE: Multidetector CT imaging of the chest, abdomen and pelvis was performed following the standard protocol during bolus administration of intravenous contrast. CONTRAST:  128m OMNIPAQUE IOHEXOL 300 MG/ML  SOLN COMPARISON:  10/29/2019 CT chest, abdomen and pelvis. FINDINGS: CT CHEST FINDINGS Cardiovascular: Normal heart size. No significant pericardial effusion/thickening. Atherosclerotic nonaneurysmal thoracic aorta. Normal caliber pulmonary arteries. No central pulmonary emboli. Mediastinum/Nodes: Hypodense 1.1 cm right thyroid nodule, stable. Not clinically significant; no follow-up imaging recommended (ref: J Am Coll Radiol. 2015 Feb;12(2): 143-50). Mildly patulous thoracic esophagus containing oral contrast. No pathologically enlarged axillary, mediastinal or hilar lymph nodes. Lungs/Pleura: No pneumothorax. No pleural effusion. Basilar right upper lobe sub solid 0.8 cm pulmonary nodule (series 4/image 62), abutting and distorting the minor fissure, previously 0.8 cm on 10/29/2019 chest CT and 0.6 cm on 04/03/2026 chest CT, slowly growing. No acute consolidative airspace disease, lung masses or new significant pulmonary nodules. Musculoskeletal: No aggressive appearing focal osseous lesions. Partially visualized surgical hardware from ACDF. T12 vertebral hemangiomas unchanged. CT ABDOMEN PELVIS FINDINGS Hepatobiliary: Normal liver with no liver mass. Cholecystectomy. Bile ducts are stable and within normal post cholecystectomy limits with CBD diameter 6 mm. Pancreas: Normal, with no mass or duct dilation. Spleen: Normal size. No mass. Adrenals/Urinary Tract: Normal adrenals. No hydronephrosis. Small parapelvic renal cysts in both kidneys. Subcentimeter hypodense  anterior upper right renal cortical lesion is too small to characterize, stable and considered benign. No suspicious renal masses. Normal bladder. Stomach/Bowel: Status post Roux-en-Y gastric bypass surgery with small hiatal hernia. Intact gastrojejunostomy. No acute gastric abnormality. Collapsed excluded distal stomach. Normal caliber small bowel with no small bowel wall thickening. Oral contrast transits to the colon. Normal appendix. Stable postsurgical changes from partial distal colectomy with intact appearing distal colonic anastomosis. No large bowel wall thickening or acute pericolonic fat stranding. Vascular/Lymphatic: Atherosclerotic nonaneurysmal abdominal aorta. Patent portal, splenic, hepatic and renal veins. No pathologically enlarged lymph nodes in the abdomen or pelvis. Reproductive: Status post hysterectomy, with no abnormal findings at the vaginal cuff. No adnexal mass. Other: No pneumoperitoneum, ascites or focal fluid collection. Musculoskeletal: No aggressive appearing focal osseous lesions. Mild lumbar spondylosis. IMPRESSION: 1. Basilar right upper lobe subsolid 0.8 cm pulmonary nodule, mildly increased from 0.6 cm on baseline 04/03/2018 chest CT, abutting and distorting the minor fissure, cannot exclude slow growing primary bronchogenic adenocarcinoma. Multidisciplinary thoracic oncology consultation suggested. 2. No evidence of metastatic disease in the chest, abdomen or pelvis. 3. Aortic Atherosclerosis (ICD10-I70.0). Electronically Signed   By: JIlona SorrelM.D.   On: 04/28/2020 11:14      LABORATORY DATA:  I have reviewed the data as listed Lab Results  Component Value Date   WBC 6.2 04/28/2020   HGB 12.3 04/28/2020   HCT 38.1 04/28/2020   MCV 93.4 04/28/2020   PLT 228 04/28/2020   Recent Labs    08/01/19 1243 10/29/19 1238 11/15/19 1048 04/28/20 0834  NA 136 136  --  136  K 4.1 4.1  --  3.6  CL 103 102  --  104  CO2 23 24  --  25  GLUCOSE 103* 105*  --  119*   BUN 9 11  --  8  CREATININE 0.76 0.85  --  0.74  CALCIUM 8.8* 8.9  --  8.3*  GFRNONAA >60 >60  --  >60  GFRAA >60 >60  --   --   PROT 6.8 7.0 6.8 6.6  ALBUMIN 3.8 4.0  --  3.8  AST 26 25  --  42*  ALT 25 24  --  35  ALKPHOS 102 87  --  93  BILITOT 1.0 0.9  --  1.0   Iron/TIBC/Ferritin/ %Sat    Component Value Date/Time   IRON 83 07/31/2018 0947   TIBC 411 07/31/2018 0947   FERRITIN 16 07/31/2018 0947   IRONPCTSAT 20 07/31/2018 0947     Preop CEA was obtained on 04/03/2018, at 1.8  ASSESSMENT & PLAN:  1. Adenocarcinoma of sigmoid colon (HCC)   2. Lung nodules   3. Hypocalcemia   Cancer Staging Adenocarcinoma of sigmoid colon ([employer]) Staging form: Colon and Rectum, AJCC 8th Edition - Clinical: No stage assigned - Unsigned - Pathologic stage from 05/04/2018: Stage IIA (pT3, pN0, cM0) - Signed by Earlie Server, MD on 05/04/2018  # Stage IIA sigmoid colon  Clinically patient is doing very well. Surveillance CT was independent reviewed by me and discussed with patient. No evidence of metastatic disease in the chest abdomen or pelvis. Labs reviewed and are discussed with patient. Continue follow-up every 6 months with labs, history and physical examination.  She is 2 years out from her stage IIa sigmoid colon cancer treatment.  Recommend annual CT abdomen pelvis until year 5. Continue follow-up with gastroenterology for endoscopy as indicated.  Slow-growing right lung nodules, probably low-grade lung cancer. Images were independently reviewed and discussed with patient. I also present patient's case on 05/01/2020 tumor board. Consensus reached upon referring patient to pulmonology Dr. Patsey Berthold for biopsy via bronchoscopy.  #Hypocalcemia, recommend patient to continue calcium supplementation. Recommend repeat CBC,CMP CEA  in 6 months.    Orders Placed This Encounter  Procedures  . CBC with Differential/Platelet    Standing Status:   Future    Standing Expiration Date:   05/01/2021   . Comprehensive metabolic panel    Standing Status:   Future    Standing Expiration Date:   05/01/2021  . CEA    Standing Status:   Future    Standing Expiration Date:   05/01/2021  . Ambulatory referral to Pulmonology    Referral Priority:   Routine    Referral Type:   Consultation    Referral Reason:   Specialty Services Required    Referred to Provider:   Tyler Pita, MD    Requested Specialty:   Pulmonary Disease    Number of Visits Requested:   1    We spent sufficient time to discuss many aspect of care, questions were answered to patient's satisfaction.  Earlie Server, MD, PhD  05/01/2020

## 2020-05-01 NOTE — Progress Notes (Signed)
Patient here for follow up. No new concerns voiced.  °

## 2020-05-01 NOTE — Progress Notes (Signed)
Tumor Board Documentation  Jordan Mahoney was presented by Dr Tasia Catchings at our Tumor Board on 05/01/2020, which included representatives from medical oncology,radiation oncology,surgical,pathology,navigation,radiology,pharmacy,genetics,research,palliative care,pulmonology.  Jordan Mahoney currently presents as a new patient,for discussion with history of the following treatments: active survellience.  Additionally, we reviewed previous medical and familial history, history of present illness, and recent lab results along with all available histopathologic and imaging studies. The tumor board considered available treatment options and made the following recommendations: Biopsy Refer to Dr Clementeen Hoof  The following procedures/referrals were also placed: No orders of the defined types were placed in this encounter.   Clinical Trial Status: not discussed   Staging used: To be determined  Lung Nodules  National site-specific guidelines   were discussed with respect to the case.  Tumor board is a meeting of clinicians from various specialty areas who evaluate and discuss patients for whom a multidisciplinary approach is being considered. Final determinations in the plan of care are those of the provider(s). The responsibility for follow up of recommendations given during tumor board is that of the provider.   Today's extended care, comprehensive team conference, Jordan Mahoney was not present for the discussion and was not examined.   Multidisciplinary Tumor Board is a multidisciplinary case peer review process.  Decisions discussed in the Multidisciplinary Tumor Board reflect the opinions of the specialists present at the conference without having examined the patient.  Ultimately, treatment and diagnostic decisions rest with the primary provider(s) and the patient.

## 2020-05-14 LAB — TSH: TSH: 0.356 u[IU]/mL — ABNORMAL LOW (ref 0.450–4.500)

## 2020-05-14 LAB — T4, FREE: Free T4: 1.3 ng/dL (ref 0.82–1.77)

## 2020-05-20 ENCOUNTER — Other Ambulatory Visit: Payer: Self-pay

## 2020-05-20 ENCOUNTER — Encounter: Payer: Self-pay | Admitting: "Endocrinology

## 2020-05-20 ENCOUNTER — Ambulatory Visit (INDEPENDENT_AMBULATORY_CARE_PROVIDER_SITE_OTHER): Payer: Medicare Other | Admitting: "Endocrinology

## 2020-05-20 VITALS — BP 122/78 | HR 68 | Ht 66.0 in | Wt 193.4 lb

## 2020-05-20 DIAGNOSIS — E063 Autoimmune thyroiditis: Secondary | ICD-10-CM

## 2020-05-20 DIAGNOSIS — E038 Other specified hypothyroidism: Secondary | ICD-10-CM | POA: Diagnosis not present

## 2020-05-20 DIAGNOSIS — R7303 Prediabetes: Secondary | ICD-10-CM

## 2020-05-20 LAB — POCT GLYCOSYLATED HEMOGLOBIN (HGB A1C): HbA1c, POC (prediabetic range): 6.1 % (ref 5.7–6.4)

## 2020-05-20 MED ORDER — LEVOTHYROXINE SODIUM 75 MCG PO TABS: 75.0000 ug | ORAL_TABLET | Freq: Every day | ORAL | 1 refills | Status: DC

## 2020-05-20 NOTE — Progress Notes (Signed)
05/20/2020, 10:06 AM   Jordan Mahoney is a 70 y.o.-year-old female patient returning for follow-up after she was seen in consultation for hypothyroidism.    PCP:  Jordan Clarks, FNP.   Past Medical History:  Diagnosis Date  . Allergic rhinoconjunctivitis   . Allergies   . Back pain   . Chronic uveitis   . Frequent UTI   . GERD (gastroesophageal reflux disease)   . HA (headache)   . Hearing loss    wearing bilateral aides  . Lactose intolerance   . Migraine   . Osteopenia   . Pre-diabetes   . 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 Sensorineural hearing loss   . Tingling of both feet   . Tinnitus     Past Surgical History:  Procedure Laterality Date  . ABDOMINAL HYSTERECTOMY    . belly button    . c sections    . CATARACT EXTRACTION Right   . CHOLECYSTECTOMY    . COLON RESECTION N/A 04/17/2018   Procedure: LAPAROSCOPIC COLON RESECTION POSSIBLE OSTOMY;  Surgeon: Jordan Sprague, DO;  Location: [facility] ORS;  Service: General;  Laterality: N/A;  . ELBOW SURGERY Left   . GANGLION CYST EXCISION Left 01/22/2016   base of thumb  . GASTRIC BYPASS    . GLAUCOMA SURGERY    . GLAUCOMA SURGERY  12/13/2019   right eye  . HERNIA REPAIR    . neck fusion  07/04/2017   Dr. Carloyn Mahoney in [place]  . SPINE SURGERY    . WRIST SURGERY Right     Social History   Socioeconomic History  . Marital status: Married    Spouse name: Not on file  . Number of children: 2  . Years of education: college  . Highest education level: Not on file  Occupational History    Comment: retired  Tobacco Use  . Smoking status: Former Smoker    Quit date: 1983    Years since quitting: 39.2  . Smokeless tobacco: Never Used  . Tobacco comment: Quit in 1985  Substance and Sexual Activity  . Alcohol use: Yes    Alcohol/week: 1.0 standard drink    Types: 1 Cans of beer per week    Comment: Rare   . Drug  use: No  . Sexual activity: Not on file  Other Topics Concern  . Not on file  Social History Narrative   Patient lives at home with her husband Jordan Mahoney).    Education two years of college.   Caffeine - two glasses of tea.   Social Determinants of Health   Financial Resource Strain: Not on file  Food Insecurity: Not on file  Transportation Needs: Not on file  Physical Activity: Not on file  Stress: Not on file  Social Connections: Not on file    Family History  Problem Relation Age of Onset  . Diabetes Father   . Stroke Father   . Squamous cell carcinoma Mother   . Migraines Daughter   . Breast cancer Paternal Aunt   . Allergic rhinitis Neg Hx   . Angioedema Neg Hx   . Asthma Neg Hx   . Eczema Neg Hx   . Immunodeficiency Neg Hx   . Urticaria Neg Hx     Outpatient Encounter Medications as of 05/20/2020  Medication Sig  . vitamin B-12 (CYANOCOBALAMIN) 100 MCG tablet Take 100 mcg by mouth daily.  [name] azelastine (ASTELIN) 0.1 % nasal spray Place 2 sprays into both nostrils daily.  . brimonidine (ALPHAGAN) 0.2 % ophthalmic solution Place 1 drop into the right eye 3 (three) times daily.  [name] CALCIUM CITRATE PO Take 500 mg by mouth 2 (two) times daily. Bariatric  . cyproheptadine (PERIACTIN) 4 MG tablet Take 1 tablet (4 mg total) by mouth at bedtime.  . dorzolamide-timolol (COSOPT) 22.3-6.8 MG/ML ophthalmic solution Place 1 drop into the right eye 2 times daily.  [name] eletriptan (RELPAX) 40 MG tablet Take 1 tablet at the onset of migraine. May repeat in 2 hours if headache persists or recurs. Not to exceed 2 tabs/24hours.  . Ergocalciferol (VITAMIN D2) 2000 UNITS TABS Take 2,000 Units by mouth daily.   . fexofenadine (ALLEGRA) 180 MG tablet Take 1 tablet (180 mg total) by mouth daily. (Patient taking differently: Take 180 mg by mouth 2 (two) times daily.)  . folic acid (FOLVITE) 1 MG tablet Take 1 mg by mouth daily.   . Fremanezumab-vfrm (AJOVY) 225 MG/1.5ML SOAJ Inject 225 mg into the  skin every 30 (thirty) days.  [name] gabapentin (NEURONTIN) 400 MG capsule Take 1 capsule (400 mg total) by mouth 2 (two) times daily.  [name] levothyroxine (SYNTHROID) 75 MCG tablet Take 1 tablet (75 mcg total) by mouth daily before breakfast.  . Magnesium Oxide 400 MG CAPS Take 400 mg by mouth 2 (two) times daily.   . methotrexate (RHEUMATREX) 2.5 MG tablet As directed by doctor.  . Multiple Vitamin (MULTIVITAMIN) tablet Take 1 tablet by mouth 2 (two) times daily. Bariatric  . Omega-3 Krill Oil 500 MG CAPS Take 1 capsule by mouth daily.   [name] omeprazole (PRILOSEC) 20 MG capsule Take 1 capsule (20 mg total) by mouth in the morning and at bedtime.  . Riboflavin 100 MG CAPS Take 100 mg by mouth daily. VIT B2  . solifenacin (VESICARE) 10 MG tablet Take 10 mg by mouth daily.  . solifenacin (VESICARE) 10 MG tablet Vesicare (Patient not taking: Reported on 05/01/2020)  . tiZANidine (ZANAFLEX) 4 MG capsule Take 4 mg by mouth 3 (three) times daily as needed for muscle spasms. Take 1 capsule (4 mg total) by mouth Three (3) times a day as needed  . vitamin C (ASCORBIC ACID) 500 MG tablet Take 500 mg by mouth 2 (two) times daily.  . [DISCONTINUED] levothyroxine (SYNTHROID) 75 MCG tablet Take 1 tablet (75 mcg total) by mouth daily before breakfast.   No facility-administered encounter medications on file as of 05/20/2020.    ALLERGIES: Allergies  Allergen Reactions  . Latex     Rash/hives  . Sulfa Antibiotics Other (See Comments)    Causes skin reaction ( on her mid back)  Pigmentation.   . Tape   . Valium [Diazepam]     Violent dreams   VACCINATION STATUS:  There is no immunization history on file for this patient.   HPI  Jordan Mahoney  is a patient with the above medical history.  During her last visit, she was diagnosed with hypothyroidism and was initiated on levothyroxine.  She is currently on levothyroxine 75 mcg p.o. daily before breakfast.  She reports compliance to her medication.  Her  previsit thyroid function tests are consistent with appropriate replacement.    -She reports improvement in her previous symptoms including  fatigue, cold intolerance.  She has other medical problems including iritis, dry skin, dry brittle nails.  She has family history of thyroid dysfunction in her mother and in her sibling.  She has no family history of thyroid malignancy.    Pt denies feeling nodules in neck, hoarseness, dysphagia/odynophagia, SOB with lying down. She reports feeling a heaviness on her bilateral lower extremities.  No history of  radiation therapy to head or neck. No recent use of iodine supplements.  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  She has prediabetes, not on intervention.   ROS:  Limited as above.   Physical Exam: BP 122/78   Pulse 68   Ht 5\' 6"  (1.676 m)   Wt 193 lb 6.4 oz (87.7 kg)   BMI 31.22 kg/m  Wt Readings from Last 3 Encounters:  05/20/20 193 lb 6.4 oz (87.7 kg)  05/01/20 191 lb 6.4 oz (86.8 kg)  02/29/20 184 lb 12.8 oz (83.8 kg)    Constitutional:  Body mass index is 31.22 kg/m., not in acute distress, normal state of mind Eyes: PERRLA, EOMI, no exophthalmos ENT: moist mucous membranes, no thyromegaly, no cervical lymphadenopathy   CMP ( most recent) CMP     Component Value Date/Time   NA 136 04/28/2020 0834   K 3.6 04/28/2020 0834   CL 104 04/28/2020 0834   CO2 25 04/28/2020 0834   GLUCOSE 119 (H) 04/28/2020 0834   BUN 8 04/28/2020 0834   CREATININE 0.74 04/28/2020 0834   CALCIUM 8.3 (L) 04/28/2020 0834   PROT 6.6 04/28/2020 0834   PROT 6.8 11/15/2019 1048   ALBUMIN 3.8 04/28/2020 0834   AST 42 (H) 04/28/2020 0834   ALT 35 04/28/2020 0834   ALKPHOS 93 04/28/2020 0834   BILITOT 1.0 04/28/2020 0834   GFRNONAA >60 04/28/2020 0834   GFRAA >60 10/29/2019 1238     Diabetic Labs (most recent): Lab Results  Component Value Date    HGBA1C 6.1 05/20/2020   HGBA1C 6.0 (H) 11/15/2019     Lipid Panel ( most recent) Lipid Panel  No results found for: CHOL, TRIG, HDL, CHOLHDL, VLDL, LDLCALC, LDLDIRECT, LABVLDL     Lab Results  Component Value Date   TSH 0.356 (L) 05/13/2020   TSH 1.910 02/12/2020   TSH 5.940 (H) 11/15/2019   FREET4 1.30 05/13/2020   FREET4 1.09 02/12/2020       ASSESSMENT: 1. Hypothyroidism 2.  Prediabetes  PLAN:  Her previsit thyroid function tests are such that she would continue to benefit from her current dose of levothyroxine 75 mcg p.o. daily before breakfast.    - We discussed about the correct intake of her thyroid hormone, on empty stomach at fasting, with water, separated by at least 30 minutes from breakfast and other medications,  and separated by more than 4 hours from calcium, iron, multivitamins, acid reflux medications (PPIs). -Patient is made aware of the fact that thyroid hormone replacement is needed for life, dose to be adjusted by periodic monitoring of thyroid function  tests. Her labs also confirm Hashimoto's thyroiditis as a etiology for hypothyroidism.   -Due to absence of clinical goiter, no need for thyroid ultrasound. -Her point-of-care A1c 6.1%, unchanged from 6% during her last visit.   She was never diagnosed and treated for diabetes, likely indicating prediabetes.    - she acknowledges that there is a room for improvement in her food and drink choices. - Suggestion is made for her to avoid simple carbohydrates  from her diet including Cakes, Sweet Desserts, Ice Cream, Soda (diet and regular), Sweet Tea, Candies, Chips, Cookies, Store Bought Juices, Alcohol in Excess of  1-2 drinks a day, Artificial Sweeteners,  Coffee Creamer, and "Sugar-free" Products, Lemonade. This will help patient to have more stable blood glucose profile and potentially avoid unintended weight gain.   She is advised to maintain close follow-up with her PCP.      - Time spent on this  patient care encounter:  30 minutes of which 50% was spent in  counseling and the rest reviewing  her current and  previous labs / studies and medications  doses and developing a plan for long term care, and documenting this care. Jordan Mahoney  participated in the discussions, expressed understanding, and voiced agreement with the above plans.  All questions were answered to her satisfaction. she is encouraged to contact clinic should she have any questions or concerns prior to her return visit.   Return in about 6 months (around 11/19/2020) for F/U with Pre-visit Labs, A1c -NV.  Glade Lloyd, MD [facility] [address] [place], [place] [address] Phone: [phone]  Fax: [phone]   05/20/2020, 10:06 AM  This note was partially dictated with voice recognition software. Similar sounding words can be transcribed inadequately or may not  be corrected upon review.

## 2020-05-22 ENCOUNTER — Other Ambulatory Visit: Payer: Self-pay

## 2020-05-22 ENCOUNTER — Ambulatory Visit (INDEPENDENT_AMBULATORY_CARE_PROVIDER_SITE_OTHER): Payer: Medicare Other | Admitting: Internal Medicine

## 2020-05-22 ENCOUNTER — Encounter: Payer: Self-pay | Admitting: Internal Medicine

## 2020-05-22 DIAGNOSIS — R911 Solitary pulmonary nodule: Secondary | ICD-10-CM

## 2020-05-22 NOTE — Assessment & Plan Note (Addendum)
1st noted 04/03/2018 4 mm > up to 8 mm  by 10/19/19   In pt out > 15 years from smoking with h/o stage I colon ca so more  likely benign than malignant but since subsolid could also be a slow growing adenoca > will present to Dr Windell Norfolk to see if wants to purse it now with Super D CT /  navigational bx or wait for a one year comparison study vs excisonal bx, the best way to be sure but the highest level of invasiveness.   Discussed in detail all the  indications, usual  risks and alternatives  relative to the benefits with patient who agrees to proceed with w/u as outlined.            Each maintenance medication was reviewed in detail including emphasizing most importantly the difference between maintenance and prns and under what circumstances the prns are to be triggered using an action plan format where appropriate.  Total time for H and P, chart review, counseling, r  and generating customized AVS unique to this office visit / same day charting = 36 min

## 2020-05-22 NOTE — Progress Notes (Signed)
Jordan Mahoney, female    DOB: 1950/09/07     MRN: [id number]   Brief patient profile:  58 yowf quit smoking 1983 with allergies  rhinitis late 20s > on shots per Ernst Bowler still on Azelastin and "allergy pill" otc daily controls all symptoms.  [facility] dx colon ca s/p partial colectomy ? Stage 1 > no RT or Chemo   04/03/2018  Small scattered nodules largest 3 mm  04/30/2019 one up to 6 mm minor fissure was 4 mm   10/29/2019 no change> Basilar right upper lobe subsolid 0.8 cm pulmonary nodule, mildly increased from 0.6 cm on baseline 04/03/2018 chest CT, abutting and distorting the minor fissure, cannot exclude slow growing primary bronchogenic adenocarcinoma. Multidisciplinary thoracic oncology consultation suggested.     History of Present Illness  05/22/2020  Pulmonary/ 1st office eval/ Jordan Mahoney / [place] [facility]  Chief Complaint  Patient presents with  . Pulmonary Consult    Referred by Dr. Earlie Server for eval of lung mass.   Dyspnea:  Not limited by breathing from desired activities  / no aerobics Cough: none  Sleep: able to flat one pillow  SABA use: none  Joints get sore with low pressure x 20 years > neg rheum w/u per pt   No obvious day to day or daytime variability or assoc excess/ purulent sputum or mucus plugs or hemoptysis or cp or chest tightness, subjective wheeze or overt sinus or hb symptoms.   Sleeping  without nocturnal  or early am exacerbation  of respiratory  c/o's or need for noct saba. Also denies any obvious fluctuation of symptoms with weather or environmental changes or other aggravating or alleviating factors except as outlined above   No unusual exposure hx or h/o childhood pna/ asthma or knowledge of premature birth.  Current Allergies, Complete Past Medical History, Past Surgical History, Family History, and Social History were reviewed in [employer] record.  ROS  The following are not active complaints unless  bolded Hoarseness, sore throat, dysphagia, dental problems, itching, sneezing,  nasal congestion or discharge of excess mucus or purulent secretions, ear ache,   fever, chills, sweats, unintended wt loss or wt gain, classically pleuritic or exertional cp,  orthopnea pnd or arm/hand swelling  or leg swelling, presyncope, palpitations, abdominal pain, anorexia, nausea, vomiting, diarrhea  or change in bowel habits or change in bladder habits, change in stools or change in urine, dysuria, hematuria,  rash, arthralgias, visual complaints, headache, numbness, weakness or ataxia or problems with walking or coordination,  change in mood or  memory.           Past Medical History:  Diagnosis Date  . Allergic rhinoconjunctivitis   . Allergies   . Back pain   . Chronic uveitis   . Frequent UTI   . GERD (gastroesophageal reflux disease)   . HA (headache)   . Hearing loss    wearing bilateral aides  . Lactose intolerance   . Migraine   . Osteopenia   . Pre-diabetes   . 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 Sensorineural hearing loss   . Tingling of both feet   . Tinnitus     Outpatient Medications Prior to Visit  Medication Sig Dispense Refill  . azelastine (ASTELIN) 0.1 % nasal spray Place 2 sprays into both nostrils daily. 90 mL 3  . brimonidine (ALPHAGAN) 0.2 % ophthalmic solution Place into the right eye 3 (three) times daily.    [name] CALCIUM CITRATE PO Take 500  mg by mouth 2 (two) times daily. Bariatric    . cyproheptadine (PERIACTIN) 4 MG tablet Take 1 tablet (4 mg total) by mouth at bedtime. 30 tablet 3  . dorzolamide-timolol (COSOPT) 22.3-6.8 MG/ML ophthalmic solution Place 1 drop into the right eye 2 (two) times daily.    [name] eletriptan (RELPAX) 40 MG tablet Take 1 tablet at the onset of migraine. May repeat in 2 hours if headache persists or recurs. Not to exceed 2 tabs/24hours. 9 tablet 12  . Ergocalciferol (VITAMIN D2) 2000 UNITS TABS Take 2,000 Units by mouth daily.     . folic acid (FOLVITE)  1 MG tablet Take 1 mg by mouth daily.     . Fremanezumab-vfrm (AJOVY) 225 MG/1.5ML SOAJ Inject 225 mg into the skin every 30 (thirty) days. 1.5 mL 11  . gabapentin (NEURONTIN) 400 MG capsule Take 1 capsule (400 mg total) by mouth 2 (two) times daily. 180 capsule 3  . levothyroxine (SYNTHROID) 75 MCG tablet Take 1 tablet (75 mcg total) by mouth daily before breakfast. 90 tablet 1  . Magnesium Oxide 400 MG CAPS Take 400 mg by mouth 2 (two) times daily.     . methotrexate (RHEUMATREX) 2.5 MG tablet As directed by doctor.    . Multiple Vitamin (MULTIVITAMIN) tablet Take 1 tablet by mouth 2 (two) times daily. Bariatric    . Omega-3 Krill Oil 500 MG CAPS Take 1 capsule by mouth daily.     [name] omeprazole (PRILOSEC) 20 MG capsule Take 1 capsule (20 mg total) by mouth in the morning and at bedtime. 60 capsule 5  . solifenacin (VESICARE) 10 MG tablet Take 10 mg by mouth daily.    [name] tiZANidine (ZANAFLEX) 4 MG capsule Take 4 mg by mouth 3 (three) times daily as needed for muscle spasms. Take 1 capsule (4 mg total) by mouth Three (3) times a day as needed    . vitamin B-12 (CYANOCOBALAMIN) 100 MCG tablet Take 100 mcg by mouth daily.    . vitamin C (ASCORBIC ACID) 500 MG tablet Take 500 mg by mouth 2 (two) times daily.    . brimonidine (ALPHAGAN) 0.2 % ophthalmic solution Place 1 drop into the right eye 3 (three) times daily.    . dorzolamide-timolol (COSOPT) 22.3-6.8 MG/ML ophthalmic solution Place 1 drop into the right eye 2 times daily.    . fexofenadine (ALLEGRA) 180 MG tablet Take 1 tablet (180 mg total) by mouth daily. (Patient taking differently: Take 180 mg by mouth 2 (two) times daily.) 90 tablet 1  . Riboflavin 100 MG CAPS Take 100 mg by mouth daily. VIT B2    . solifenacin (VESICARE) 10 MG tablet Vesicare (Patient not taking: Reported on 05/01/2020)     No facility-administered medications prior to visit.     Objective:     BP 140/84 (BP Location: Left Arm, Cuff Size: Normal)   Pulse (!) 58    Temp (!) 97.2 F (36.2 C) (Temporal)   Ht 5\' 6"  (1.676 m)   Wt 194 lb (88 kg)   SpO2 99% Comment: on RA  BMI 31.31 kg/m   SpO2: 99 % (on RA)   amb pleasant wf nad   HEENT : pt wearing mask not removed for exam due to covid -19 concerns.    NECK :  without JVD/Nodes/TM/ nl carotid upstrokes bilaterally   LUNGS: no acc muscle use,  Nl contour chest which is clear to A and P bilaterally without cough on insp or exp maneuvers  CV:  RRR  no s3 or murmur or increase in P2, and no edema   ABD:  soft and nontender with nl inspiratory excursion in the supine position. No bruits or organomegaly appreciated, bowel sounds nl  MS:  Nl gait/ ext warm without deformities, calf tenderness, cyanosis or clubbing No obvious joint restrictions   SKIN: warm and dry without lesions    NEURO:  alert, approp, nl sensorium with  no motor or cerebellar deficits apparent.      Assessment   Solitary pulmonary nodule on lung CT 1st noted 04/03/2018 4 mm > up to 8 mm  by 10/19/19   In pt out > 15 years from smoking with h/o stage I colon ca so more  likely benign than malignant but since subsolid could also be a slow growing adenoca > will present to Dr Windell Norfolk to see if wants to purse it now with Super D CT /  navigational bx or wait for a one year comparison study vs excisonal bx, the best way to be sure but the highest level of invasiveness.   Discussed in detail all the  indications, usual  risks and alternatives  relative to the benefits with patient who agrees to proceed with w/u as outlined.            Each maintenance medication was reviewed in detail including emphasizing most importantly the difference between maintenance and prns and under what circumstances the prns are to be triggered using an action plan format where appropriate.  Total time for H and P, chart review, counseling, r  and generating customized AVS unique to this office visit / same day charting = 36 min            Christinia Gully, MD 05/22/2020

## 2020-05-22 NOTE — Patient Instructions (Addendum)
I will present your case to Dr Windell Norfolk and see if he recommends  Super D CT and navigational biopsy and if so our  office will be in touch with you.

## 2020-05-28 ENCOUNTER — Ambulatory Visit (INDEPENDENT_AMBULATORY_CARE_PROVIDER_SITE_OTHER): Payer: Medicare Other

## 2020-05-28 DIAGNOSIS — J309 Allergic rhinitis, unspecified: Secondary | ICD-10-CM | POA: Diagnosis not present

## 2020-06-02 ENCOUNTER — Telehealth: Payer: Self-pay | Admitting: *Deleted

## 2020-06-02 DIAGNOSIS — R911 Solitary pulmonary nodule: Secondary | ICD-10-CM

## 2020-06-02 NOTE — Telephone Encounter (Signed)
-----   Message from Tanda Rockers, MD sent at 05/27/2020  6:00 PM EDT ----- Dr Windell Norfolk rec super D CT chest 10/29/20 but ok to schedule directly with him now if wants to discuss or see him once the CT is available . Convert to phone note. ----- Message ----- From: Garner Nash, DO Sent: 05/27/2020   2:57 PM EDT To: Tanda Rockers, MD   I agree with you. 6 month follow up with next CT as a super D. I am happy to see her after that scan to discuss next steps.   Or, depends on how anxious she is about. I have bx sub <62mm sized lesions. With its growth history from 2020 to 2021 its prob a low-grade adeno but obviously tissue is the issue.   Thanks and let me know how I can help,   Brad     ----- Message ----- From: Tanda Rockers, MD Sent: 05/27/2020   8:25 AM EDT To: Garner Nash, DO  Never smoker/ remote  ? Stage I colon ca with serial ct's for this uncovered the following   1st noted 04/03/2018 RUL SPN  4 mm > up to 8 mm  by 10/19/19  CT 04/28/20: Basilar right upper lobe sub solid 0.8 cm pulmonary nodule (series 4/image 62), abutting and distorting the minor fissure, previously 0.8 cm on 10/29/2019 chest CT and 0.6 cm on 04/03/2026 chest CT   I was going to go ahead and do Super D CT next but she's had so much radiation I wanted to be sure what you would want if you think she's a candidate for you to bx- with such a small lesion I'm inclined to waitanother 6 months as there is no change fro 9/3 to 04/28/20 in the size and this is sub solid    She is in [place] so I saw her in [place] and told her I would defer to you re next step.  Thanks in advance for your thoughts  Ronalee Belts

## 2020-06-02 NOTE — Telephone Encounter (Signed)
Spoke with the pt and notified of response per Dr Melvyn Novas. She states that she prefers to wait until she has the scan done to see Dr Valeta Harms. I have placed order for this.

## 2020-06-04 ENCOUNTER — Telehealth: Payer: Self-pay

## 2020-06-04 NOTE — Telephone Encounter (Signed)
Erroneous entry, please delete.

## 2020-06-13 ENCOUNTER — Other Ambulatory Visit: Payer: Self-pay

## 2020-06-13 ENCOUNTER — Ambulatory Visit
Admission: RE | Admit: 2020-06-13 | Discharge: 2020-06-13 | Disposition: A | Discharge status: Home / Self Care | Payer: Medicare Other | Source: Ambulatory Visit | Attending: Internal Medicine | Admitting: Internal Medicine

## 2020-06-13 DIAGNOSIS — R911 Solitary pulmonary nodule: Secondary | ICD-10-CM

## 2020-06-16 NOTE — Progress Notes (Signed)
Spoke with Jordan Mahoney and notified of results per Dr. Melvyn Novas. Jordan Mahoney verbalized understanding. She states that she wants to discuss further with Dr Melvyn Novas before making appt with Dr Valeta Harms. Forwarding back to Dr Melvyn Novas. [phone].

## 2020-06-16 NOTE — Progress Notes (Signed)
Spoke with the pt and scheduled appt with BI in [place]

## 2020-06-23 ENCOUNTER — Ambulatory Visit (INDEPENDENT_AMBULATORY_CARE_PROVIDER_SITE_OTHER): Payer: Medicare Other | Admitting: Neurology

## 2020-06-23 ENCOUNTER — Encounter: Payer: Self-pay | Admitting: Neurology

## 2020-06-23 VITALS — BP 125/80 | HR 63 | Ht 66.0 in | Wt 194.0 lb

## 2020-06-23 DIAGNOSIS — R202 Paresthesia of skin: Secondary | ICD-10-CM

## 2020-06-23 DIAGNOSIS — G43909 Migraine, unspecified, not intractable, without status migrainosus: Secondary | ICD-10-CM | POA: Diagnosis not present

## 2020-06-23 MED ORDER — ELETRIPTAN HYDROBROMIDE 40 MG PO TABS: ORAL_TABLET | ORAL | 12 refills | Status: DC

## 2020-06-23 MED ORDER — GABAPENTIN 400 MG PO CAPS: 400.0000 mg | ORAL_CAPSULE | Freq: Two times a day (BID) | ORAL | 3 refills | Status: DC

## 2020-06-23 NOTE — Patient Instructions (Signed)
Continue current medication  call for worsening symptoms See you back in 1 year
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
## 2020-06-25 ENCOUNTER — Ambulatory Visit (INDEPENDENT_AMBULATORY_CARE_PROVIDER_SITE_OTHER): Payer: Medicare Other

## 2020-06-25 ENCOUNTER — Other Ambulatory Visit: Payer: Self-pay

## 2020-06-25 ENCOUNTER — Ambulatory Visit (INDEPENDENT_AMBULATORY_CARE_PROVIDER_SITE_OTHER): Payer: Medicare Other | Admitting: Pulmonary Disease

## 2020-06-25 ENCOUNTER — Encounter: Payer: Self-pay | Admitting: Pulmonary Disease

## 2020-06-25 VITALS — BP 134/76 | HR 80 | Temp 98.0°F | Ht 66.0 in | Wt 196.8 lb

## 2020-06-25 DIAGNOSIS — R911 Solitary pulmonary nodule: Secondary | ICD-10-CM | POA: Diagnosis not present

## 2020-06-25 DIAGNOSIS — J309 Allergic rhinitis, unspecified: Secondary | ICD-10-CM | POA: Diagnosis not present

## 2020-06-25 DIAGNOSIS — Z85038 Personal history of other malignant neoplasm of large intestine: Secondary | ICD-10-CM

## 2020-06-25 DIAGNOSIS — Z87891 Personal history of nicotine dependence: Secondary | ICD-10-CM

## 2020-06-25 NOTE — Progress Notes (Signed)
Synopsis: Referred in May 2022 for lung nodule by Ludwig Clarks, FNP  Subjective:   PATIENT ID: Jordan Mahoney GENDER: female DOB: [birth year], MRN: [id number]  Chief Complaint  Patient presents with  . Consult    Lung nodule, referred by MW.     PMH of colon ca, stage IIa s/p resection at [facility], no chemo, ct scans at that time found the lung nodule. Has had subq follow up. Former smoker, quit in 1983. Also has thyroid disease. No family history of lung cancer.  Initial CT imaging revealed a small subcentimeter lesion in the right lung in 2020.  This has been followed.  On yearly CT imaging has showed growth.  Her most recent CT revealed an approximate 8 mm right upper lobe nodule that has lobulated margins.  Today in the office we reviewed her CT imaging from 2020 until now.  She has no significant respiratory complaints she quit smoking several years ago.   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 Allergic rhinoconjunctivitis   . Allergies   . Back pain   . Chronic uveitis   . Frequent UTI   . GERD (gastroesophageal reflux disease)   . HA (headache)   . Hearing loss    wearing bilateral aides  . Lactose intolerance   . Migraine   . Osteopenia   . Pre-diabetes   . 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 Sensorineural hearing loss   . Tingling of both feet   . Tinnitus      Family History  Problem Relation Age of Onset  . Diabetes Father   . Stroke Father   . Squamous cell carcinoma Mother   . Migraines Daughter   . Breast cancer Paternal Aunt   . Allergic rhinitis Neg Hx   . Angioedema Neg Hx   . Asthma Neg Hx   . Eczema Neg Hx   . Immunodeficiency Neg Hx   .  Urticaria Neg Hx      Past Surgical History:  Procedure Laterality Date  . ABDOMINAL HYSTERECTOMY    . belly button    . c sections    . CATARACT EXTRACTION Right   . CHOLECYSTECTOMY    . COLON RESECTION N/A 04/17/2018   Procedure: LAPAROSCOPIC COLON RESECTION POSSIBLE OSTOMY;  Surgeon: Benjamine Sprague, DO;  Location: [facility] ORS;  Service: General;  Laterality: N/A;  . ELBOW SURGERY Left   . GANGLION CYST EXCISION Left 01/22/2016   base of thumb  . GASTRIC BYPASS    . GLAUCOMA SURGERY    . GLAUCOMA SURGERY  12/13/2019   right eye  . HERNIA REPAIR    . neck fusion  07/04/2017   Dr. Carloyn Manner in [place]  . SPINE SURGERY    . WRIST SURGERY Right     Social History   Socioeconomic History  . Marital status: Married    Spouse name: Not on file  . Number of children: 2  . Years of education: college  . Highest education level: Not on file  Occupational History    Comment: retired  Tobacco Use  . Smoking status: Former Smoker    Packs/day: 0.50  Years: 10.00    Pack years: 5.00    Types: Cigarettes    Quit date: 80    Years since quitting: 40.3  . Smokeless tobacco: Never Used  Substance and Sexual Activity  . Alcohol use: Yes    Alcohol/week: 1.0 standard drink    Types: 1 Cans of beer per week    Comment: Rare   . Drug use: No  . Sexual activity: Not on file  Other Topics Concern  . Not on file  Social History Narrative   Patient lives at home with her husband Jordan Mahoney).    Education two years of college.   Caffeine - two glasses of tea.   Social Determinants of Health   Financial Resource Strain: Not on file  Food Insecurity: Not on file  Transportation Needs: Not on file  Physical Activity: Not on file  Stress: Not on file  Social Connections: Not on file  Intimate Partner Violence: Not on file     Allergies  Allergen Reactions  . Latex     Rash/hives  . Sulfa Antibiotics Other (See Comments)    Causes skin reaction ( on her mid back)  Pigmentation.   . Tape    . Valium [Diazepam]     Violent dreams     Outpatient Medications Prior to Visit  Medication Sig Dispense Refill  . azelastine (ASTELIN) 0.1 % nasal spray Place 2 sprays into both nostrils daily. 90 mL 3  . brimonidine (ALPHAGAN) 0.2 % ophthalmic solution Place into the right eye 3 (three) times daily.    [name] CALCIUM CITRATE PO Take 500 mg by mouth 2 (two) times daily. Bariatric    . dorzolamide-timolol (COSOPT) 22.3-6.8 MG/ML ophthalmic solution Place 1 drop into the right eye 2 (two) times daily.    [name] eletriptan (RELPAX) 40 MG tablet Take 1 tablet at the onset of migraine. May repeat in 2 hours if headache persists or recurs. Not to exceed 2 tabs/24hours. 9 tablet 12  . Ergocalciferol (VITAMIN D2) 2000 UNITS TABS Take 2,000 Units by mouth daily.     . folic acid (FOLVITE) 1 MG tablet Take 1 mg by mouth daily.     . Fremanezumab-vfrm (AJOVY) 225 MG/1.5ML SOAJ Inject 225 mg into the skin every 30 (thirty) days. 1.5 mL 11  . gabapentin (NEURONTIN) 400 MG capsule Take 1 capsule (400 mg total) by mouth 2 (two) times daily. 180 capsule 3  . levothyroxine (SYNTHROID) 75 MCG tablet Take 1 tablet (75 mcg total) by mouth daily before breakfast. 90 tablet 1  . Magnesium Oxide 400 MG CAPS Take 400 mg by mouth 2 (two) times daily.     . methotrexate (RHEUMATREX) 2.5 MG tablet As directed by doctor.    . Multiple Vitamin (MULTIVITAMIN) tablet Take 1 tablet by mouth 2 (two) times daily. Bariatric    . Omega-3 Krill Oil 500 MG CAPS Take 1 capsule by mouth daily.     [name] omeprazole (PRILOSEC) 20 MG capsule Take 1 capsule (20 mg total) by mouth in the morning and at bedtime. 60 capsule 5  . solifenacin (VESICARE) 10 MG tablet Take 10 mg by mouth daily.    [name] tiZANidine (ZANAFLEX) 4 MG capsule Take 4 mg by mouth 3 (three) times daily as needed for muscle spasms. Take 1 capsule (4 mg total) by mouth Three (3) times a day as needed    . vitamin B-12 (CYANOCOBALAMIN) 100 MCG tablet Take 100 mcg by mouth daily.    [name]  vitamin C (ASCORBIC ACID) 500 MG tablet Take 500 mg by mouth 2 (two) times daily.    . cyproheptadine (PERIACTIN) 4 MG tablet Take 1 tablet (4 mg total) by mouth at bedtime. 30 tablet 3   No facility-administered medications prior to visit.    Review of Systems  Constitutional: Negative for chills, fever, malaise/fatigue and weight loss.  HENT: Negative for hearing loss, sore throat and tinnitus.   Eyes: Negative for blurred vision and double vision.  Respiratory: Negative for cough, hemoptysis, sputum production, shortness of breath, wheezing and stridor.   Cardiovascular: Negative for chest pain, palpitations, orthopnea, leg swelling and PND.  Gastrointestinal: Negative for abdominal pain, constipation, diarrhea, heartburn, nausea and vomiting.  Genitourinary: Negative for dysuria, hematuria and urgency.  Musculoskeletal: Negative for joint pain and myalgias.  Skin: Negative for itching and rash.  Neurological: Negative for dizziness, tingling, weakness and headaches.  Endo/Heme/Allergies: Negative for environmental allergies. Does not bruise/bleed easily.  Psychiatric/Behavioral: Negative for depression. The patient is nervous/anxious. The patient does not have insomnia.   All other systems reviewed and are negative.    Objective:  Physical Exam Vitals reviewed.  Constitutional:      General: She is not in acute distress.    Appearance: She is well-developed. She is obese.  HENT:     Head: Normocephalic and atraumatic.  Eyes:     General: No scleral icterus.    Conjunctiva/sclera: Conjunctivae normal.     Pupils: Pupils are equal, round, and reactive to light.  Neck:     Vascular: No JVD.     Trachea: No tracheal deviation.  Cardiovascular:     Rate and Rhythm: Normal rate and regular rhythm.     Heart sounds: Normal heart sounds. No murmur heard.   Pulmonary:     Effort: Pulmonary effort is normal. No tachypnea, accessory muscle usage or respiratory distress.      Breath sounds: No stridor. No wheezing, rhonchi or rales.  Abdominal:     General: Bowel sounds are normal. There is no distension.     Palpations: Abdomen is soft.     Tenderness: There is no abdominal tenderness.  Musculoskeletal:        General: No tenderness.     Cervical back: Neck supple.  Lymphadenopathy:     Cervical: No cervical adenopathy.  Skin:    General: Skin is warm and dry.     Capillary Refill: Capillary refill takes less than 2 seconds.     Findings: No rash.  Neurological:     Mental Status: She is alert and oriented to person, place, and time.  Psychiatric:        Behavior: Behavior normal.      Vitals:   06/25/20 1329  BP: 134/76  Pulse: 80  Temp: 98 F (36.7 C)  TempSrc: Temporal  SpO2: 97%  Weight: 196 lb 12.8 oz (89.3 kg)  Height: 5\' 6"  (1.676 m)   97% on RA BMI Readings from Last 3 Encounters:  06/25/20 31.76 kg/m  06/23/20 31.31 kg/m  05/22/20 31.31 kg/m   Wt Readings from Last 3 Encounters:  06/25/20 196 lb 12.8 oz (89.3 kg)  06/23/20 194 lb (88 kg)  05/22/20 194 lb (88 kg)     CBC    Component Value Date/Time   WBC 6.2 04/28/2020 0834   RBC 4.08 04/28/2020 0834   HGB 12.3 04/28/2020 0834   HCT 38.1 04/28/2020 0834   PLT 228 04/28/2020 0834   MCV 93.4 04/28/2020 0834  MCH 30.1 04/28/2020 0834   MCHC 32.3 04/28/2020 0834   RDW 13.6 04/28/2020 0834   LYMPHSABS 2.4 04/28/2020 0834   MONOABS 0.6 04/28/2020 0834   EOSABS 0.2 04/28/2020 0834   BASOSABS 0.1 04/28/2020 0834    Chest Imaging: 06/13/2020 CT chest: 8 x 6 x 9 mm right upper lobe pulmonary nodule that abuts the minor fissure.  Stable from March imaging however substantially increased in size from February 2020. The patient's images have been independently reviewed by me.    Pulmonary Functions Testing Results: No flowsheet data found.  FeNO:   Pathology:  Echocardiogram:   Heart Catheterization:     Assessment & Plan:     ICD-10-CM   1. Right upper  lobe pulmonary nodule  R91.1 Ambulatory referral to Cardiothoracic Surgery    NM PET Image Initial (PI) Skull Base To Thigh    Pulmonary Function Test  2. Former smoker  Z87.891   3. History of colon cancer  Z85.038     Discussion:  This is a 70 year old female, history of colon cancer, stage IIa in 2020.  She is a former smoker quit 1983 has a small right upper lobe pulmonary nodule that has shown growth from 2020 until now.  It is now approximately 8 mm in largest cross-section.  Plan: I think that she would be a good candidate for consideration of a combination ENB+RATS case. I will reach out to Dr. Kipp Brood to review images and consideration. I have ordered a PET scan as well as pulmonary function test which can hopefully be completed soon. Today in the office we discussed the various options to include navigational bronchoscopy for tissue sampling. As well as considerations for surgery.  Patient is interested in potentially having resection if she is deemed a candidate.    Current Outpatient Medications:  .  azelastine (ASTELIN) 0.1 % nasal spray, Place 2 sprays into both nostrils daily., Disp: 90 mL, Rfl: 3 .  brimonidine (ALPHAGAN) 0.2 % ophthalmic solution, Place into the right eye 3 (three) times daily., Disp: , Rfl:  .  CALCIUM CITRATE PO, Take 500 mg by mouth 2 (two) times daily. Bariatric, Disp: , Rfl:  .  dorzolamide-timolol (COSOPT) 22.3-6.8 MG/ML ophthalmic solution, Place 1 drop into the right eye 2 (two) times daily., Disp: , Rfl:  .  eletriptan (RELPAX) 40 MG tablet, Take 1 tablet at the onset of migraine. May repeat in 2 hours if headache persists or recurs. Not to exceed 2 tabs/24hours., Disp: 9 tablet, Rfl: 12 .  Ergocalciferol (VITAMIN D2) 2000 UNITS TABS, Take 2,000 Units by mouth daily. , Disp: , Rfl:  .  folic acid (FOLVITE) 1 MG tablet, Take 1 mg by mouth daily. , Disp: , Rfl:  .  Fremanezumab-vfrm (AJOVY) 225 MG/1.5ML SOAJ, Inject 225 mg into the skin every 30  (thirty) days., Disp: 1.5 mL, Rfl: 11 .  gabapentin (NEURONTIN) 400 MG capsule, Take 1 capsule (400 mg total) by mouth 2 (two) times daily., Disp: 180 capsule, Rfl: 3 .  levothyroxine (SYNTHROID) 75 MCG tablet, Take 1 tablet (75 mcg total) by mouth daily before breakfast., Disp: 90 tablet, Rfl: 1 .  Magnesium Oxide 400 MG CAPS, Take 400 mg by mouth 2 (two) times daily. , Disp: , Rfl:  .  methotrexate (RHEUMATREX) 2.5 MG tablet, As directed by doctor., Disp: , Rfl:  .  Multiple Vitamin (MULTIVITAMIN) tablet, Take 1 tablet by mouth 2 (two) times daily. Bariatric, Disp: , Rfl:  .  Omega-3 Krill Oil  500 MG CAPS, Take 1 capsule by mouth daily. , Disp: , Rfl:  .  omeprazole (PRILOSEC) 20 MG capsule, Take 1 capsule (20 mg total) by mouth in the morning and at bedtime., Disp: 60 capsule, Rfl: 5 .  solifenacin (VESICARE) 10 MG tablet, Take 10 mg by mouth daily., Disp: , Rfl:  .  tiZANidine (ZANAFLEX) 4 MG capsule, Take 4 mg by mouth 3 (three) times daily as needed for muscle spasms. Take 1 capsule (4 mg total) by mouth Three (3) times a day as needed, Disp: , Rfl:  .  vitamin B-12 (CYANOCOBALAMIN) 100 MCG tablet, Take 100 mcg by mouth daily., Disp: , Rfl:  .  vitamin C (ASCORBIC ACID) 500 MG tablet, Take 500 mg by mouth 2 (two) times daily., Disp: , Rfl:   I spent 62 minutes dedicated to the care of this patient on the date of this encounter to include pre-visit review of records, face-to-face time with the patient discussing conditions above, post visit ordering of testing, clinical documentation with the electronic health record, making appropriate referrals as documented, and communicating necessary findings to members of the patients care team.   Garner Nash, [facility] 06/25/2020 1:52 PM

## 2020-06-25 NOTE — Patient Instructions (Addendum)
Thank you for visiting Dr. Valeta Harms at [facility]. Today we recommend the following:  Orders Placed This Encounter  Procedures  . NM PET Image Initial (PI) Skull Base To Thigh  . Ambulatory referral to Cardiothoracic Surgery  . Pulmonary Function Test   I have placed referral to Dr. Kipp Brood.   Return in about 8 weeks (around 08/20/2020) for with APP or Dr. Valeta Harms.    Please do your part to reduce the spread of COVID-19.

## 2020-06-26 DIAGNOSIS — J3081 Allergic rhinitis due to animal (cat) (dog) hair and dander: Secondary | ICD-10-CM | POA: Diagnosis not present

## 2020-06-26 NOTE — Progress Notes (Signed)
VIALS EXP 06-26-21

## 2020-06-27 ENCOUNTER — Ambulatory Visit (HOSPITAL_COMMUNITY)
Admission: RE | Admit: 2020-06-27 | Discharge: 2020-06-27 | Disposition: A | Discharge status: Home / Self Care | Payer: Medicare Other | Source: Ambulatory Visit | Attending: Pulmonary Disease | Admitting: Pulmonary Disease

## 2020-06-27 ENCOUNTER — Other Ambulatory Visit: Payer: Self-pay

## 2020-06-27 DIAGNOSIS — K449 Diaphragmatic hernia without obstruction or gangrene: Secondary | ICD-10-CM | POA: Insufficient documentation

## 2020-06-27 DIAGNOSIS — I700 Atherosclerosis of aorta: Secondary | ICD-10-CM | POA: Insufficient documentation

## 2020-06-27 DIAGNOSIS — Z85038 Personal history of other malignant neoplasm of large intestine: Secondary | ICD-10-CM | POA: Insufficient documentation

## 2020-06-27 DIAGNOSIS — K760 Fatty (change of) liver, not elsewhere classified: Secondary | ICD-10-CM | POA: Diagnosis not present

## 2020-06-27 DIAGNOSIS — R911 Solitary pulmonary nodule: Secondary | ICD-10-CM | POA: Insufficient documentation

## 2020-06-27 DIAGNOSIS — Z9884 Bariatric surgery status: Secondary | ICD-10-CM | POA: Diagnosis not present

## 2020-06-27 DIAGNOSIS — J309 Allergic rhinitis, unspecified: Secondary | ICD-10-CM

## 2020-06-27 LAB — GLUCOSE, CAPILLARY: Glucose-Capillary: 107 mg/dL — ABNORMAL HIGH (ref 70–99)

## 2020-06-27 MED ORDER — FLUDEOXYGLUCOSE F - 18 (FDG) INJECTION
9.9000 | Freq: Once | INTRAVENOUS | Status: AC
  Administered 2020-06-27: 9.76 via INTRAVENOUS

## 2020-07-08 ENCOUNTER — Other Ambulatory Visit (HOSPITAL_COMMUNITY)
Admission: RE | Admit: 2020-07-08 | Discharge: 2020-07-08 | Disposition: A | Discharge status: Home / Self Care | Payer: Medicare Other | Source: Ambulatory Visit | Attending: Pulmonary Disease | Admitting: Pulmonary Disease

## 2020-07-08 DIAGNOSIS — Z20822 Contact with and (suspected) exposure to covid-19: Secondary | ICD-10-CM | POA: Diagnosis not present

## 2020-07-08 DIAGNOSIS — Z01812 Encounter for preprocedural laboratory examination: Secondary | ICD-10-CM | POA: Diagnosis present

## 2020-07-08 LAB — SARS CORONAVIRUS 2 (TAT 6-24 HRS): SARS Coronavirus 2: NEGATIVE

## 2020-07-11 ENCOUNTER — Other Ambulatory Visit: Payer: Self-pay

## 2020-07-11 ENCOUNTER — Ambulatory Visit (INDEPENDENT_AMBULATORY_CARE_PROVIDER_SITE_OTHER): Payer: Medicare Other | Admitting: Pulmonary Disease

## 2020-07-11 DIAGNOSIS — R911 Solitary pulmonary nodule: Secondary | ICD-10-CM | POA: Diagnosis not present

## 2020-07-11 LAB — PULMONARY FUNCTION TEST
DL/VA % pred: 79 %
DL/VA: 3.99 ml/min/mmHg/L
DLCO cor % pred: 74 %
DLCO cor: 20.18 ml/min/mmHg
DLCO unc % pred: 74 %
DLCO unc: 20.18 ml/min/mmHg
FEF 25-75 Post: 2.79 L/sec
FEF 25-75 Pre: 2.11 L/sec
FEF2575-%Change-Post: 32 %
FEF2575-%Pred-Post: 134 %
FEF2575-%Pred-Pre: 101 %
FEV1-%Change-Post: 5 %
FEV1-%Pred-Post: 98 %
FEV1-%Pred-Pre: 93 %
FEV1-Post: 2.4 L
FEV1-Pre: 2.28 L
FEV1FVC-%Change-Post: 6 %
FEV1FVC-%Pred-Pre: 101 %
FEV6-%Change-Post: 0 %
FEV6-%Pred-Post: 89 %
FEV6-%Pred-Pre: 89 %
FEV6-Post: 2.86 L
FEV6-Pre: 2.88 L
FEV6FVC-%Change-Post: 0 %
FEV6FVC-%Pred-Post: 98 %
FEV6FVC-%Pred-Pre: 97 %
FVC-%Change-Post: -1 %
FVC-%Pred-Post: 90 %
FVC-%Pred-Pre: 91 %
FVC-Post: 2.86 L
FVC-Pre: 2.89 L
Post FEV1/FVC ratio: 84 %
Post FEV6/FVC ratio: 100 %
Pre FEV1/FVC ratio: 79 %
Pre FEV6/FVC Ratio: 100 %
RV % pred: 95 %
RV: 2.14 L
TLC % pred: 87 %
TLC: 4.71 L

## 2020-07-11 NOTE — Progress Notes (Signed)
Full PFT completed today ? ?

## 2020-07-18 ENCOUNTER — Other Ambulatory Visit: Payer: Self-pay

## 2020-07-18 ENCOUNTER — Institutional Professional Consult (permissible substitution) (INDEPENDENT_AMBULATORY_CARE_PROVIDER_SITE_OTHER): Payer: Medicare Other | Admitting: Thoracic Surgery (Cardiothoracic Vascular Surgery)

## 2020-07-18 ENCOUNTER — Encounter: Payer: Self-pay | Admitting: Thoracic Surgery (Cardiothoracic Vascular Surgery)

## 2020-07-18 VITALS — BP 113/80 | HR 72 | Resp 20 | Ht 66.0 in | Wt 195.0 lb

## 2020-07-18 DIAGNOSIS — R911 Solitary pulmonary nodule: Secondary | ICD-10-CM

## 2020-07-18 NOTE — Progress Notes (Signed)
Jordan Mahoney [address]       Jordan Mahoney [id number]             [phone]                    Jordan Mahoney [facility] Medical Record #[id number] Date of Birth: 02-23-1950  Referring: Jordan Nash, DO Primary Care: Jordan Clarks, FNP Primary Cardiologist: None  Chief Complaint:    Chief Complaint  Patient presents with  . Lung Lesion    Surgical consult, Chest CT 06/13/20, PET Scan 06/27/20, PFT's 07/11/20    History of Present Illness:    Jordan Mahoney 70 y.o. female referred by Dr. Valeta Mahoney for surgical evaluation of a right upper lobe pulmonary nodule.  She does have a history of stage II colon cancer that was treated surgically and on her surveillance imaging she was noted to have an increase in size and this right upper lobe nodule.  She denies any shortness of breath.  She does have chronic migraines but this has been longstanding.  She has gained some weight which she attributes to her Hashimoto's disease.    Smoking Hx: Quit smoking in the early 1980s Has not had much secondhand smoke exposure.   Zubrod Score: At the time of surgery this patient's most appropriate activity status/level should be described as: '[]'     0    Normal activity, no symptoms '[x]'     1    Restricted in physical strenuous activity but ambulatory, able to do out light work '[]'     2    Ambulatory and capable of self care, unable to do work activities, up and about               >50 % of waking hours                              '[]'     3    Only limited self care, in bed greater than 50% of waking hours '[]'     4    Completely disabled, no self care, confined to bed or chair '[]'     5    Moribund   Past Medical History:  Diagnosis Date  . Allergic rhinoconjunctivitis   . Allergies   . Back pain   . Chronic uveitis   . Frequent UTI   . GERD (gastroesophageal reflux disease)   . HA (headache)   . Hearing loss    wearing bilateral aides  . Lactose intolerance   . Migraine   . Osteopenia    . Pre-diabetes   . 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 Sensorineural hearing loss   . Tingling of both feet   . Tinnitus     Past Surgical History:  Procedure Laterality Date  . ABDOMINAL HYSTERECTOMY    . belly button    . c sections    . CATARACT EXTRACTION Right   . CHOLECYSTECTOMY    . COLON RESECTION N/A 04/17/2018   Procedure: LAPAROSCOPIC COLON RESECTION POSSIBLE OSTOMY;  Surgeon: Jordan Sprague, DO;  Location: [facility] ORS;  Service: General;  Laterality: N/A;  . ELBOW SURGERY Left   . GANGLION CYST EXCISION Left 01/22/2016   base of thumb  . GASTRIC BYPASS    . GLAUCOMA SURGERY    . GLAUCOMA SURGERY  12/13/2019   right eye  . HERNIA REPAIR    . neck fusion  07/04/2017   Dr. Carloyn Mahoney in [place]  . SPINE SURGERY    . WRIST SURGERY Right     Family History  Problem Relation Age of Onset  . Diabetes Father   . Stroke Father   . Squamous cell carcinoma Mother   . Migraines Daughter   . Breast cancer Paternal Aunt   . Allergic rhinitis Neg Hx   . Angioedema Neg Hx   . Asthma Neg Hx   . Eczema Neg Hx   . Immunodeficiency Neg Hx   . Urticaria Neg Hx      Social History   Tobacco Use  Smoking Status Former Smoker  . Packs/day: 0.50  . Years: 10.00  . Pack years: 5.00  . Types: Cigarettes  . Quit date: 35  . Years since quitting: 40.4  Smokeless Tobacco Never Used    Social History   Substance and Sexual Activity  Alcohol Use Yes  . Alcohol/week: 1.0 standard drink  . Types: 1 Cans of beer per week   Comment: Rare      Allergies  Allergen Reactions  . Latex     Rash/hives  . Sulfa Antibiotics Other (See Comments)    Causes skin reaction ( on her mid back)  Pigmentation.   . Tape   . Valium [Diazepam]     Violent dreams    Current Outpatient Medications  Medication Sig Dispense Refill  . azelastine (ASTELIN) 0.1 % nasal spray Place 2 sprays into both nostrils daily. 90 mL 3  . brimonidine (ALPHAGAN) 0.2 % ophthalmic solution Place into the right eye 3  (three) times daily.    [name] CALCIUM CITRATE PO Take 500 mg by mouth 2 (two) times daily. Bariatric    . dorzolamide-timolol (COSOPT) 22.3-6.8 MG/ML ophthalmic solution Place 1 drop into the right eye 2 (two) times daily.    [name] eletriptan (RELPAX) 40 MG tablet Take 1 tablet at the onset of migraine. May repeat in 2 hours if headache persists or recurs. Not to exceed 2 tabs/24hours. 9 tablet 12  . Ergocalciferol (VITAMIN D2) 2000 UNITS TABS Take 2,000 Units by mouth daily.     . folic acid (FOLVITE) 1 MG tablet Take 1 mg by mouth daily.     . Fremanezumab-vfrm (AJOVY) 225 MG/1.5ML SOAJ Inject 225 mg into the skin every 30 (thirty) days. 1.5 mL 11  . gabapentin (NEURONTIN) 400 MG capsule Take 1 capsule (400 mg total) by mouth 2 (two) times daily. 180 capsule 3  . levothyroxine (SYNTHROID) 75 MCG tablet Take 1 tablet (75 mcg total) by mouth daily before breakfast. 90 tablet 1  . Magnesium Oxide 400 MG CAPS Take 400 mg by mouth 2 (two) times daily.     . methotrexate (RHEUMATREX) 2.5 MG tablet As directed by doctor.    . Multiple Vitamin (MULTIVITAMIN) tablet Take 1 tablet by mouth 2 (two) times daily. Bariatric    . Omega-3 Krill Oil 500 MG CAPS Take 1 capsule by mouth daily.     [name] omeprazole (PRILOSEC) 20 MG capsule Take 1 capsule (20 mg total) by mouth in the morning and at bedtime. 60 capsule 5  . solifenacin (VESICARE) 10 MG tablet Take 10 mg by mouth daily.    [name] tiZANidine (ZANAFLEX) 4 MG capsule Take 4 mg by mouth 3 (three) times daily as needed for muscle spasms. Take 1 capsule (4 mg total) by mouth Three (3) times a day as needed    . vitamin B-12 (CYANOCOBALAMIN) 100 MCG tablet Take  100 mcg by mouth daily.    . vitamin C (ASCORBIC ACID) 500 MG tablet Take 500 mg by mouth 2 (two) times daily.     No current facility-administered medications for this visit.    Review of Systems  Constitutional: Negative for malaise/fatigue and weight loss.  Respiratory: Negative.   Cardiovascular: Negative.    Musculoskeletal: Positive for joint pain and myalgias.  Neurological: Positive for headaches.     PHYSICAL EXAMINATION: BP 113/80   Pulse 72   Resp 20   Ht '5\' 6"'  (1.676 m)   Wt 195 lb (88.5 kg)   SpO2 98% Comment: RA  BMI 31.47 kg/m  Physical Exam  Diagnostic Studies & Laboratory data:     Recent Radiology Findings:   NM PET Image Initial (PI) Skull Base To Thigh  Result Date: 06/30/2020 CLINICAL DATA:  Initial treatment strategy for right upper lobe pulmonary nodule. History of colon cancer with colon resection in 2020. EXAM: NUCLEAR MEDICINE PET SKULL BASE TO THIGH TECHNIQUE: 9.8 mCi F-18 FDG was injected intravenously. Full-ring PET imaging was performed from the skull base to thigh after the radiotracer. CT data was obtained and used for attenuation correction and anatomic localization. Fasting blood glucose: 107 mg/dl COMPARISON:  Multiple exams, including chest CT from 06/13/2020 FINDINGS: Mediastinal blood pool activity: SUV max 2.8 Liver activity: SUV max NA NECK: Asymmetric palatine tonsillar activity with maximum SUV 7.3 on the left and 5.4 on the right, without obvious CT correlate, probably physiologic. Incidental CT findings: none CHEST: The right upper lobe nodule measures 0.8 by 0.6 cm on image 71 series 4, right at the borderline for sensitive PET-CT assessment. Maximum SUV 1.3. Left subpectoral lymph node 0.7 cm in short axis, previously 0.4 cm on 10/24/2018 and previously 0.4 cm on 04/30/2019, maximum SUV 2.4. Incidental CT findings: Small hiatal hernia with postoperative findings and prior gastric bypass. Gas in the esophagus, possibly from dysmotility. Atherosclerotic calcification of the aortic arch and branch vessels. ABDOMEN/PELVIS: Scattered activity in bowel is likely physiologic. Incidental CT findings: Hepatic steatosis. Cholecystectomy. Postoperative findings from gastric bypass. Postoperative findings from sigmoid colon reanastomosis. Presumed scarring along the  left kidney upper pole. Peripelvic renal cysts. Aortoiliac atherosclerotic vascular disease. SKELETON: No significant abnormal hypermetabolic activity in this region. Incidental CT findings: T12 hemangioma. Lower cervical plate and screw fixator. Grade 1 degenerative anterolisthesis at L4-5. IMPRESSION: 1. The right upper lobe nodule of concern has a maximum SUV of 1.3. This currently measures 0.8 by 0.6 cm (at the lower size limits for sensitive PET-CT assessment), and does appear to have increased in size in density since 04/03/2018. As such, despite the low activity, the lesion is moderately suspicious for low-grade adenocarcinoma. 2. Asymmetric palatine tonsillar activity greater on the left than the right, probably physiologic/incidental given the lack of obvious CT correlate. 3. Other imaging findings of potential clinical significance: Small hiatal hernia. Prior gastric bypass. Prior sigmoid anastomosis. Gas in the esophagus, possibly from dysmotility. Aortic Atherosclerosis (ICD10-I70.0). Hepatic steatosis. Presumed scarring in the left kidney upper pole. Electronically Signed   By: Van Clines M.D.   On: 06/30/2020 09:29       I have independently reviewed the above radiology studies  and reviewed the findings with the patient.   Recent Lab Findings: Lab Results  Component Value Date   WBC 6.2 04/28/2020   HGB 12.3 04/28/2020   HCT 38.1 04/28/2020   PLT 228 04/28/2020   GLUCOSE 119 (H) 04/28/2020   ALT 35 04/28/2020  AST 42 (H) 04/28/2020   NA 136 04/28/2020   K 3.6 04/28/2020   CL 104 04/28/2020   CREATININE 0.74 04/28/2020   BUN 8 04/28/2020   CO2 25 04/28/2020   TSH 0.356 (L) 05/13/2020   HGBA1C 6.1 05/20/2020     PFTs: - FVC: 91% - FEV1: 93% -DLCO: 74%  Problem List: 8 mm right upper lobe pulmonary nodule with SUV uptake of 1.3. History of colon cancer stage IIa treated in March 2020  Assessment / Plan:   This is a 70 year old female who presents with an 8  mm right upper lobe pulmonary nodule.  She does have a history of colon cancer which was only treated surgically.  She did not receive any systemic chemotherapy or radiation given the lack of lymph node involvement.  On review of her cross-sectional imaging the nodule is central but it does appear right above the fissure.  It is unclear as to whether or not this nodule is potentially a met from her colon cancer or a primary lung cancer.  I think in this setting it is important in to distinguish between the 2, given that metastasis would only require a wedge resection.  We have discussed several options, and I recommended that she undergo a navigational bronchoscopy with Dr. Valeta Mahoney as a separate procedure.  We will await the pathology results prior to proceeding with surgery.  If this proves to be a primary lung cancer then we will go straight to a lobectomy, and if this is metastasis then we will just perform a wedge resection.     I  spent 40 minutes with  the patient face to face in counseling and coordination of care.    Lajuana Matte 07/18/2020 12:30 PM

## 2020-07-21 ENCOUNTER — Telehealth: Payer: Self-pay | Admitting: Pulmonary Disease

## 2020-07-21 DIAGNOSIS — R911 Solitary pulmonary nodule: Secondary | ICD-10-CM

## 2020-07-21 NOTE — Telephone Encounter (Signed)
PCCM:  Called spoke with patient regarding recommendations from Dr. Kipp Brood after surgical consultation.  Plan for navigational bronchoscopy outpatient.  Tentative bronchoscopy date 08/12/2020.  Orders placed for case request and procedure.  [facility] 07/21/2020 6:17 PM

## 2020-07-22 ENCOUNTER — Encounter: Payer: Self-pay | Admitting: Pulmonary Disease

## 2020-07-22 ENCOUNTER — Telehealth: Payer: Self-pay | Admitting: Pulmonary Disease

## 2020-07-23 ENCOUNTER — Ambulatory Visit
Admission: RE | Admit: 2020-07-23 | Discharge: 2020-07-23 | Disposition: A | Discharge status: Home / Self Care | Payer: Medicare Other | Attending: Internal Medicine | Admitting: Internal Medicine

## 2020-07-23 ENCOUNTER — Ambulatory Visit: Payer: Medicare Other | Admitting: Certified Registered"

## 2020-07-23 ENCOUNTER — Encounter
Admission: RE | Disposition: A | Discharge status: Home / Self Care | Payer: Self-pay | Source: Home / Self Care | Attending: Internal Medicine

## 2020-07-23 ENCOUNTER — Other Ambulatory Visit: Payer: Self-pay

## 2020-07-23 ENCOUNTER — Encounter: Payer: Self-pay | Admitting: Internal Medicine

## 2020-07-23 DIAGNOSIS — Z85048 Personal history of other malignant neoplasm of rectum, rectosigmoid junction, and anus: Secondary | ICD-10-CM | POA: Diagnosis not present

## 2020-07-23 DIAGNOSIS — Z79899 Other long term (current) drug therapy: Secondary | ICD-10-CM | POA: Diagnosis not present

## 2020-07-23 DIAGNOSIS — K640 First degree hemorrhoids: Secondary | ICD-10-CM | POA: Insufficient documentation

## 2020-07-23 DIAGNOSIS — Z91048 Other nonmedicinal substance allergy status: Secondary | ICD-10-CM | POA: Insufficient documentation

## 2020-07-23 DIAGNOSIS — Z79890 Hormone replacement therapy (postmenopausal): Secondary | ICD-10-CM | POA: Insufficient documentation

## 2020-07-23 DIAGNOSIS — Z08 Encounter for follow-up examination after completed treatment for malignant neoplasm: Secondary | ICD-10-CM | POA: Diagnosis not present

## 2020-07-23 DIAGNOSIS — K5730 Diverticulosis of large intestine without perforation or abscess without bleeding: Secondary | ICD-10-CM | POA: Diagnosis not present

## 2020-07-23 DIAGNOSIS — Z980 Intestinal bypass and anastomosis status: Secondary | ICD-10-CM | POA: Diagnosis not present

## 2020-07-23 DIAGNOSIS — Z9049 Acquired absence of other specified parts of digestive tract: Secondary | ICD-10-CM | POA: Insufficient documentation

## 2020-07-23 DIAGNOSIS — Z91040 Latex allergy status: Secondary | ICD-10-CM | POA: Insufficient documentation

## 2020-07-23 DIAGNOSIS — Z87891 Personal history of nicotine dependence: Secondary | ICD-10-CM | POA: Insufficient documentation

## 2020-07-23 DIAGNOSIS — Z882 Allergy status to sulfonamides status: Secondary | ICD-10-CM | POA: Diagnosis not present

## 2020-07-23 HISTORY — PX: COLONOSCOPY WITH PROPOFOL: SHX5780

## 2020-07-23 SURGERY — COLONOSCOPY WITH PROPOFOL
Anesthesia: General

## 2020-07-23 MED ORDER — PROPOFOL 500 MG/50ML IV EMUL
INTRAVENOUS | Status: AC
  Filled 2020-07-23: qty 50

## 2020-07-23 MED ORDER — PROPOFOL 10 MG/ML IV BOLUS
INTRAVENOUS | Status: AC
  Filled 2020-07-23: qty 20

## 2020-07-23 MED ORDER — PROPOFOL 500 MG/50ML IV EMUL
INTRAVENOUS | Status: DC | PRN
  Administered 2020-07-23: 150 ug/kg/min via INTRAVENOUS

## 2020-07-23 MED ORDER — SODIUM CHLORIDE 0.9 % IV SOLN: INTRAVENOUS | Status: DC

## 2020-07-23 MED ORDER — PROPOFOL 10 MG/ML IV BOLUS
INTRAVENOUS | Status: DC | PRN
  Administered 2020-07-23: 100 mg via INTRAVENOUS

## 2020-07-23 NOTE — Anesthesia Preprocedure Evaluation (Signed)
Anesthesia Evaluation  Patient identified by MRN, date of birth, ID band Patient awake    Reviewed: Allergy & Precautions, H&P , NPO status , Patient's Chart, lab work & pertinent test results, reviewed documented beta blocker date and time   Airway Mallampati: II   Neck ROM: full    Dental  (+) Poor Dentition   Pulmonary neg pulmonary ROS, former smoker,    Pulmonary exam normal        Cardiovascular Exercise Tolerance: Good negative cardio ROS Normal cardiovascular exam Rhythm:regular Rate:Normal     Neuro/Psych  Headaches, negative psych ROS   GI/Hepatic Neg liver ROS, GERD  Medicated,  Endo/Other  Hypothyroidism   Renal/GU negative Renal ROS  negative genitourinary   Musculoskeletal   Abdominal   Peds  Hematology  (+) Blood dyscrasia, anemia ,   Anesthesia Other Findings Past Medical History: No date: Allergic rhinoconjunctivitis No date: Allergies No date: Back pain No date: Chronic uveitis No date: Frequent UTI No date: GERD (gastroesophageal reflux disease) No date: HA (headache) No date: Hearing loss     Comment:  wearing bilateral aides No date: Lactose intolerance No date: Migraine No date: Osteopenia No date: Pre-diabetes No date: 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[facility]) No date: Sensorineural hearing loss No date: Tingling of both feet No date: Tinnitus Past Surgical History: No date: ABDOMINAL HYSTERECTOMY No date: belly button No date: c sections No date: CATARACT EXTRACTION; Right No date: CHOLECYSTECTOMY 04/17/2018: COLON RESECTION; N/A     Comment:  Procedure: LAPAROSCOPIC COLON RESECTION POSSIBLE OSTOMY;              Surgeon: Benjamine Sprague, DO;  Location: [facility] ORS;  Service:              General;  Laterality: N/A; No date: ELBOW SURGERY; Left 01/22/2016: GANGLION CYST EXCISION; Left     Comment:  base of thumb No date: GASTRIC BYPASS No date: GLAUCOMA SURGERY 12/13/2019: GLAUCOMA SURGERY      Comment:  right eye No date: HERNIA REPAIR 07/04/2017: neck fusion     Comment:  Dr. Carloyn Manner in [place] No date: SPINE SURGERY No date: WRIST SURGERY; Right BMI    Body Mass Index: 32.28 kg/m     Reproductive/Obstetrics negative OB ROS                             Anesthesia Physical Anesthesia Plan  ASA: III  Anesthesia Plan: General   Post-op Pain Management:    Induction:   PONV Risk Score and Plan:   Airway Management Planned:   Additional Equipment:   Intra-op Plan:   Post-operative Plan:   Informed Consent: I have reviewed the patients History and Physical, chart, labs and discussed the procedure including the risks, benefits and alternatives for the proposed anesthesia with the patient or authorized representative who has indicated his/her understanding and acceptance.     Dental Advisory Given  Plan Discussed with: CRNA  Anesthesia Plan Comments:         Anesthesia Quick Evaluation

## 2020-07-23 NOTE — Interval H&P Note (Signed)
History and Physical Interval Note:  07/23/2020 2:28 PM  Jordan Mahoney  has presented today for surgery, with the diagnosis of HX COLON CANCER.  The various methods of treatment have been discussed with the patient and family. After consideration of risks, benefits and other options for treatment, the patient has consented to  Procedure(s): COLONOSCOPY WITH PROPOFOL (N/A) as a surgical intervention.  The patient's history has been reviewed, patient examined, no change in status, stable for surgery.  I have reviewed the patient's chart and labs.  Questions were answered to the patient's satisfaction.     [place], [place]

## 2020-07-23 NOTE — Op Note (Signed)
[facility] Patient Name: Jordan Mahoney Procedure Date: 07/23/2020 2:37 PM MRN: [id number] Account #: [id number] Date of Birth: 1950/10/24 Admit Type: Outpatient Age: 70 Room: [facility] ENDO ROOM 2 Gender: Female Note Status: Finalized Procedure:             Colonoscopy Indications:           High risk colon cancer surveillance: Personal history                         of colon cancer Providers:             Benay Pike. Alice Reichert MD, MD Referring MD:          Westley Chandler, W, FNP Medicines:             Propofol per Anesthesia Complications:         No immediate complications. Procedure:             Pre-Anesthesia Assessment:                        - The risks and benefits of the procedure and the                         sedation options and risks were discussed with the                         patient. All questions were answered and informed                         consent was obtained.                        - The risks and benefits of the procedure and the                         sedation options and risks were discussed with the                         patient. All questions were answered and informed                         consent was obtained.                        - Patient identification and proposed procedure were                         verified prior to the procedure by the nurse. The                         procedure was verified in the procedure room.                        - ASA Grade Assessment: III - A patient with severe                         systemic disease.                        - After reviewing the risks  and benefits, the patient                         was deemed in satisfactory condition to undergo the                         procedure.                        After obtaining informed consent, the colonoscope was                         passed under direct vision. Throughout the procedure,                         the patient's  blood pressure, pulse, and oxygen                         saturations were monitored continuously. The                         Colonoscope was introduced through the anus and                         advanced to the the cecum, identified by appendiceal                         orifice and ileocecal valve. The colonoscopy was                         performed without difficulty. The patient tolerated                         the procedure well. The quality of the bowel                         preparation was good. The ileocecal valve, appendiceal                         orifice, and rectum were photographed. Findings:      The perianal and digital rectal examinations were normal. Pertinent       negatives include normal sphincter tone and no palpable rectal lesions.      Non-bleeding internal hemorrhoids were found during retroflexion. The       hemorrhoids were Grade I (internal hemorrhoids that do not prolapse).      There was evidence of a prior end-to-end colo-rectal anastomosis in the       rectum. This was patent and was characterized by healthy appearing       mucosa. The anastomosis was traversed.      Many medium-mouthed diverticula were found in the sigmoid colon.      The exam was otherwise without abnormality. Impression:            - Non-bleeding internal hemorrhoids.                        - Patent end-to-end colo-rectal anastomosis,                         characterized by healthy appearing mucosa.                        -  Diverticulosis in the sigmoid colon.                        - The examination was otherwise normal.                        - No specimens collected. Recommendation:        - Patient has a contact number available for                         emergencies. The signs and symptoms of potential                         delayed complications were discussed with the patient.                         Return to normal activities tomorrow. Written                          discharge instructions were provided to the patient.                        - Resume previous diet.                        - Continue present medications.                        - Repeat colonoscopy in 1 year for surveillance.                        - Return to GI office PRN.                        - The findings and recommendations were discussed with                         the patient. Procedure Code(s):     --- Professional ---                        D3220, Colorectal cancer screening; colonoscopy on                         individual at high risk Diagnosis Code(s):     --- Professional ---                        K57.30, Diverticulosis of large intestine without                         perforation or abscess without bleeding                        K64.0, First degree hemorrhoids                        Z98.0, Intestinal bypass and anastomosis status                        Z85.038, Personal history of other malignant neoplasm  of large intestine CPT copyright 2019 American Medical Association. All rights reserved. The codes documented in this report are preliminary and upon coder review may  be revised to meet current compliance requirements. Attending Participation:      I personally performed the entire procedure. Efrain Sella MD, MD 07/23/2020 3:00:36 PM This report has been signed electronically. Number of Addenda: 0 Note Initiated On: 07/23/2020 2:37 PM Scope Withdrawal Time: 0 hours 2 minutes 42 seconds  Total Procedure Duration: 0 hours 5 minutes 34 seconds  Estimated Blood Loss:  Estimated blood loss: none.      [facility]

## 2020-07-23 NOTE — Transfer of Care (Signed)
Immediate Anesthesia Transfer of Care Note  Patient: Jordan Mahoney  Procedure(s) Performed: COLONOSCOPY WITH PROPOFOL (N/A )  Patient Location: PACU and Endoscopy Unit  Anesthesia Type:General  Level of Consciousness: drowsy  Airway & Oxygen Therapy: Patient Spontanous Breathing  Post-op Assessment: Report given to RN  Post vital signs: stable  Last Vitals:  Vitals Value Taken Time  BP    Temp    Pulse    Resp    SpO2      Last Pain:  Vitals:   07/23/20 1408  TempSrc: Temporal  PainSc: 0-No pain         Complications: No complications documented.

## 2020-07-23 NOTE — H&P (Signed)
Outpatient short stay form Pre-procedure 07/23/2020 2:27 PM Jordan Mahoney Jordan Mahoney, M.D.  Primary Physician: Jordan Chandler, FNP  Reason for visit:  Personal history of colon cancer s/p low anterior resection 2020.  History of present illness:   70 year old patient presenting for personal  history of rectal cancer s/p LAR in 03/2018. Patient denies any change in bowel habits, rectal bleeding or involuntary weight loss.     Current Facility-Administered Medications:  .  0.9 %  sodium chloride infusion, , Intravenous, Continuous, Mel Langan, Benay Pike, MD  Medications Prior to Admission  Medication Sig Dispense Refill Last Dose  . CALCIUM CITRATE PO Take 500 mg by mouth 2 (two) times daily. Bariatric   Past Week at Unknown time  . cetirizine (ZYRTEC) 10 MG tablet Take 10 mg by mouth daily.   Past Week at Unknown time  . dorzolamide-timolol (COSOPT) 22.3-6.8 MG/ML ophthalmic solution Place 1 drop into the right eye 2 (two) times daily.   07/22/2020 at Unknown time  . Ergocalciferol (VITAMIN D2) 2000 UNITS TABS Take 2,000 Units by mouth daily.    Past Week at Unknown time  . folic acid (FOLVITE) 1 MG tablet Take 1 mg by mouth daily.    Past Week at Unknown time  . Fremanezumab-vfrm (AJOVY) 225 MG/1.5ML SOAJ Inject 225 mg into the skin every 30 (thirty) days. 1.5 mL 11 Past Month at Unknown time  . gabapentin (NEURONTIN) 400 MG capsule Take 1 capsule (400 mg total) by mouth 2 (two) times daily. 180 capsule 3 Past Week at Unknown time  . levothyroxine (SYNTHROID) 75 MCG tablet Take 1 tablet (75 mcg total) by mouth daily before breakfast. 90 tablet 1 07/23/2020 at 0700  . Magnesium Oxide 400 MG CAPS Take 400 mg by mouth 2 (two) times daily.    Past Week at Unknown time  . methotrexate (RHEUMATREX) 2.5 MG tablet As directed by doctor.   Past Week at Unknown time  . Multiple Vitamin (MULTIVITAMIN) tablet Take 1 tablet by mouth 2 (two) times daily. Bariatric   Past Week at Unknown time  . Omega-3 Krill Oil 500 MG  CAPS Take 1 capsule by mouth daily.    Past Week at Unknown time  . omeprazole (PRILOSEC) 20 MG capsule Take 1 capsule (20 mg total) by mouth in the morning and at bedtime. 60 capsule 5 07/23/2020 at 0800  . solifenacin (VESICARE) 10 MG tablet Take 10 mg by mouth daily.   07/22/2020 at Unknown time  . timolol (BETIMOL) 0.25 % ophthalmic solution 1-2 drops 2 (two) times daily.     [name] topiramate (TOPAMAX) 100 MG tablet Take 100 mg by mouth 2 (two) times daily.     . vitamin B-12 (CYANOCOBALAMIN) 100 MCG tablet Take 100 mcg by mouth daily.   Past Week at Unknown time  . vitamin C (ASCORBIC ACID) 500 MG tablet Take 500 mg by mouth 2 (two) times daily.   Past Week at Unknown time  . azelastine (ASTELIN) 0.1 % nasal spray Place 2 sprays into both nostrils daily. 90 mL 3   . brimonidine (ALPHAGAN) 0.2 % ophthalmic solution Place into the right eye 3 (three) times daily.     [name] eletriptan (RELPAX) 40 MG tablet Take 1 tablet at the onset of migraine. May repeat in 2 hours if headache persists or recurs. Not to exceed 2 tabs/24hours. 9 tablet 12   . tiZANidine (ZANAFLEX) 4 MG capsule Take 4 mg by mouth 3 (three) times daily as needed for muscle  spasms. Take 1 capsule (4 mg total) by mouth Three (3) times a day as needed        Allergies  Allergen Reactions  . Latex     Rash/hives  . Sulfa Antibiotics Other (See Comments)    Causes skin reaction ( on her mid back)  Pigmentation.   . Tape   . Valium [Diazepam]     Violent dreams     Past Medical History:  Diagnosis Date  . Allergic rhinoconjunctivitis   . Allergies   . Back pain   . Chronic uveitis   . Frequent UTI   . GERD (gastroesophageal reflux disease)   . HA (headache)   . Hearing loss    wearing bilateral aides  . Lactose intolerance   . Migraine   . Osteopenia   . Pre-diabetes   . 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 Sensorineural hearing loss   . Tingling of both feet   . Tinnitus     Review of systems:  Otherwise negative.     Physical Exam  Gen: Alert, oriented. Appears stated age.  HEENT: [place]/AT. PERRLA. Lungs: CTA, no wheezes. CV: RR nl S1, S2. Abd: soft, benign, no masses. BS+ Ext: No edema. Pulses 2+    Planned procedures: Proceed with colonoscopy. The patient understands the nature of the planned procedure, indications, risks, alternatives and potential complications including but not limited to bleeding, infection, perforation, damage to internal organs and possible oversedation/side effects from anesthesia. The patient agrees and gives consent to proceed.  Please refer to procedure notes for findings, recommendations and patient disposition/instructions.     Jordan Mahoney Jordan Mahoney, M.D. Gastroenterology 07/23/2020  2:27 PM

## 2020-07-23 NOTE — Interval H&P Note (Signed)
History and Physical Interval Note:  07/23/2020 2:29 PM  Jordan Mahoney  has presented today for surgery, with the diagnosis of HX COLON CANCER.  The various methods of treatment have been discussed with the patient and family. After consideration of risks, benefits and other options for treatment, the patient has consented to  Procedure(s): COLONOSCOPY WITH PROPOFOL (N/A) as a surgical intervention.  The patient's history has been reviewed, patient examined, no change in status, stable for surgery.  I have reviewed the patient's chart and labs.  Questions were answered to the patient's satisfaction.     [place], [place]

## 2020-07-24 ENCOUNTER — Encounter: Payer: Self-pay | Admitting: Internal Medicine

## 2020-07-24 NOTE — Anesthesia Postprocedure Evaluation (Signed)
Anesthesia Post Note  Patient: Jordan Mahoney  Procedure(s) Performed: COLONOSCOPY WITH PROPOFOL  Patient location during evaluation: PACU Anesthesia Type: General Level of consciousness: awake and alert Pain management: pain level controlled Vital Signs Assessment: post-procedure vital signs reviewed and stable Respiratory status: spontaneous breathing, nonlabored ventilation, respiratory function stable and patient connected to nasal cannula oxygen Cardiovascular status: blood pressure returned to baseline and stable Postop Assessment: no apparent nausea or vomiting Anesthetic complications: no   No notable events documented.   Last Vitals:  Vitals:   07/23/20 1530 07/23/20 1533  BP: 126/79 126/79  Pulse: 64 67  Resp: 12 (!) 9  Temp:    SpO2: 100% 100%    Last Pain:  Vitals:   07/24/20 0728  TempSrc:   PainSc: 0-No pain                 Molli Barrows

## 2020-07-25 ENCOUNTER — Ambulatory Visit (INDEPENDENT_AMBULATORY_CARE_PROVIDER_SITE_OTHER): Payer: Medicare Other

## 2020-07-25 DIAGNOSIS — J309 Allergic rhinitis, unspecified: Secondary | ICD-10-CM | POA: Diagnosis not present

## 2020-07-25 MED ORDER — CYPROHEPTADINE HCL 4 MG PO TABS: 4.0000 mg | ORAL_TABLET | Freq: Every day | ORAL | 1 refills | Status: DC

## 2020-07-29 ENCOUNTER — Ambulatory Visit
Admission: RE | Admit: 2020-07-29 | Discharge: 2020-07-29 | Disposition: A | Discharge status: Home / Self Care | Payer: Medicare Other | Source: Ambulatory Visit | Attending: Pulmonary Disease | Admitting: Pulmonary Disease

## 2020-07-29 DIAGNOSIS — R911 Solitary pulmonary nodule: Secondary | ICD-10-CM

## 2020-08-01 ENCOUNTER — Other Ambulatory Visit (HOSPITAL_COMMUNITY)
Admission: RE | Admit: 2020-08-01 | Discharge: 2020-08-01 | Disposition: A | Discharge status: Home / Self Care | Payer: Medicare Other | Source: Ambulatory Visit | Attending: Pulmonary Disease | Admitting: Pulmonary Disease

## 2020-08-01 DIAGNOSIS — Z20822 Contact with and (suspected) exposure to covid-19: Secondary | ICD-10-CM | POA: Insufficient documentation

## 2020-08-01 DIAGNOSIS — Z01812 Encounter for preprocedural laboratory examination: Secondary | ICD-10-CM | POA: Insufficient documentation

## 2020-08-01 LAB — SARS CORONAVIRUS 2 (TAT 6-24 HRS): SARS Coronavirus 2: NEGATIVE

## 2020-08-04 ENCOUNTER — Other Ambulatory Visit: Payer: Self-pay

## 2020-08-04 ENCOUNTER — Encounter (HOSPITAL_COMMUNITY): Payer: Self-pay | Admitting: Pulmonary Disease

## 2020-08-04 ENCOUNTER — Telehealth: Payer: Medicare Other | Admitting: Thoracic Surgery (Cardiothoracic Vascular Surgery)

## 2020-08-04 NOTE — Anesthesia Preprocedure Evaluation (Addendum)
Anesthesia Evaluation  Patient identified by MRN, date of birth, ID band Patient awake    Reviewed: Allergy & Precautions, H&P , NPO status , Patient's Chart, lab work & pertinent test results, reviewed documented beta blocker date and time   Airway Mallampati: II  TM Distance: >3 FB Neck ROM: Full    Dental no notable dental hx. (+) Teeth Intact, Dental Advisory Given   Pulmonary former smoker,    Pulmonary exam normal breath sounds clear to auscultation       Cardiovascular Exercise Tolerance: Good negative cardio ROS Normal cardiovascular exam Rhythm:Regular Rate:Normal     Neuro/Psych  Headaches, PSYCHIATRIC DISORDERS Depression    GI/Hepatic Neg liver ROS, GERD  Medicated,  Endo/Other  Hypothyroidism Morbid obesity  Renal/GU negative Renal ROS  negative genitourinary   Musculoskeletal  (+) Arthritis , Osteoarthritis,    Abdominal   Peds negative pediatric ROS (+)  Hematology  (+) Blood dyscrasia, anemia ,   Anesthesia Other Findings     Reproductive/Obstetrics negative OB ROS                            Anesthesia Physical  Anesthesia Plan  ASA: 3  Anesthesia Plan: General   Post-op Pain Management:    Induction: Intravenous  PONV Risk Score and Plan: 3 and Ondansetron, Dexamethasone and Midazolam  Airway Management Planned: Oral ETT  Additional Equipment: None  Intra-op Plan:   Post-operative Plan: Extubation in OR  Informed Consent: I have reviewed the patients History and Physical, chart, labs and discussed the procedure including the risks, benefits and alternatives for the proposed anesthesia with the patient or authorized representative who has indicated his/her understanding and acceptance.     Dental Advisory Given  Plan Discussed with: CRNA and Anesthesiologist  Anesthesia Plan Comments: (  )        Anesthesia Quick Evaluation

## 2020-08-04 NOTE — Progress Notes (Signed)
Jordan Mahoney denies chest pain or shortness of breath. Patient tested negative for Covid on 08/01/20, she wore a mask when around others.

## 2020-08-05 ENCOUNTER — Ambulatory Visit (HOSPITAL_COMMUNITY): Payer: Medicare Other | Admitting: Certified Registered Nurse Anesthetist

## 2020-08-05 ENCOUNTER — Ambulatory Visit (HOSPITAL_COMMUNITY): Payer: Medicare Other

## 2020-08-05 ENCOUNTER — Encounter: Payer: Self-pay | Admitting: Oncology

## 2020-08-05 ENCOUNTER — Encounter (HOSPITAL_COMMUNITY)
Admission: RE | Disposition: A | Discharge status: Home / Self Care | Payer: Self-pay | Source: Home / Self Care | Attending: Pulmonary Disease

## 2020-08-05 ENCOUNTER — Ambulatory Visit (HOSPITAL_COMMUNITY)
Admission: RE | Admit: 2020-08-05 | Discharge: 2020-08-05 | Disposition: A | Discharge status: Home / Self Care | Payer: Medicare Other | Attending: Pulmonary Disease | Admitting: Pulmonary Disease

## 2020-08-05 ENCOUNTER — Encounter (HOSPITAL_COMMUNITY): Payer: Self-pay | Admitting: Pulmonary Disease

## 2020-08-05 DIAGNOSIS — Z85038 Personal history of other malignant neoplasm of large intestine: Secondary | ICD-10-CM | POA: Diagnosis not present

## 2020-08-05 DIAGNOSIS — Z888 Allergy status to other drugs, medicaments and biological substances status: Secondary | ICD-10-CM | POA: Insufficient documentation

## 2020-08-05 DIAGNOSIS — R911 Solitary pulmonary nodule: Secondary | ICD-10-CM | POA: Diagnosis not present

## 2020-08-05 DIAGNOSIS — Z79899 Other long term (current) drug therapy: Secondary | ICD-10-CM | POA: Diagnosis not present

## 2020-08-05 DIAGNOSIS — Z91040 Latex allergy status: Secondary | ICD-10-CM | POA: Diagnosis not present

## 2020-08-05 DIAGNOSIS — Z882 Allergy status to sulfonamides status: Secondary | ICD-10-CM | POA: Insufficient documentation

## 2020-08-05 DIAGNOSIS — Z9049 Acquired absence of other specified parts of digestive tract: Secondary | ICD-10-CM | POA: Diagnosis not present

## 2020-08-05 DIAGNOSIS — Z87891 Personal history of nicotine dependence: Secondary | ICD-10-CM | POA: Diagnosis not present

## 2020-08-05 DIAGNOSIS — Z87440 Personal history of urinary (tract) infections: Secondary | ICD-10-CM | POA: Diagnosis not present

## 2020-08-05 DIAGNOSIS — Z90710 Acquired absence of both cervix and uterus: Secondary | ICD-10-CM | POA: Insufficient documentation

## 2020-08-05 DIAGNOSIS — R846 Abnormal cytological findings in specimens from respiratory organs and thorax: Secondary | ICD-10-CM | POA: Diagnosis not present

## 2020-08-05 DIAGNOSIS — Z79890 Hormone replacement therapy (postmenopausal): Secondary | ICD-10-CM | POA: Diagnosis not present

## 2020-08-05 DIAGNOSIS — Z9889 Other specified postprocedural states: Secondary | ICD-10-CM

## 2020-08-05 HISTORY — DX: Depression, unspecified: F32.A

## 2020-08-05 HISTORY — PX: BRONCHIAL NEEDLE ASPIRATION BIOPSY: SHX5106

## 2020-08-05 HISTORY — PX: BRONCHIAL WASHINGS: SHX5105

## 2020-08-05 HISTORY — DX: Unspecified osteoarthritis, unspecified site: M19.90

## 2020-08-05 HISTORY — PX: FIDUCIAL MARKER PLACEMENT: SHX6858

## 2020-08-05 HISTORY — PX: BRONCHIAL BRUSHINGS: SHX5108

## 2020-08-05 HISTORY — PX: VIDEO BRONCHOSCOPY WITH ENDOBRONCHIAL NAVIGATION: SHX6175

## 2020-08-05 HISTORY — PX: BRONCHIAL BIOPSY: SHX5109

## 2020-08-05 LAB — BASIC METABOLIC PANEL
Anion gap: 13 (ref 5–15)
BUN: 10 mg/dL (ref 8–23)
CO2: 23 mmol/L (ref 22–32)
Calcium: 8.7 mg/dL — ABNORMAL LOW (ref 8.9–10.3)
Chloride: 108 mmol/L (ref 98–111)
Creatinine, Ser: 0.62 mg/dL (ref 0.44–1.00)
GFR, Estimated: >60 mL/min (ref >60)
Glucose, Bld: 152 mg/dL — ABNORMAL HIGH (ref 70–99)
Potassium: 3.8 mmol/L (ref 3.5–5.1)
Sodium: 144 mmol/L (ref 135–145)

## 2020-08-05 LAB — GLUCOSE, CAPILLARY
Glucose-Capillary: 142 mg/dL — ABNORMAL HIGH (ref 70–99)
Glucose-Capillary: 131 mg/dL — ABNORMAL HIGH (ref 70–99)

## 2020-08-05 SURGERY — VIDEO BRONCHOSCOPY WITH ENDOBRONCHIAL NAVIGATION
Anesthesia: General | Laterality: Right

## 2020-08-05 MED ORDER — DEXAMETHASONE SODIUM PHOSPHATE 10 MG/ML IJ SOLN
INTRAMUSCULAR | Status: DC | PRN
  Administered 2020-08-05: 5 mg via INTRAVENOUS

## 2020-08-05 MED ORDER — CHLORHEXIDINE GLUCONATE 0.12 % MT SOLN
15.0000 mL | Freq: Once | OROMUCOSAL | Status: AC
  Administered 2020-08-05: 15 mL via OROMUCOSAL
  Filled 2020-08-05: qty 15

## 2020-08-05 MED ORDER — PHENYLEPHRINE HCL (PRESSORS) 10 MG/ML IV SOLN
INTRAVENOUS | Status: DC | PRN
  Administered 2020-08-05 (×2): 80 ug via INTRAVENOUS

## 2020-08-05 MED ORDER — LIDOCAINE 2% (20 MG/ML) 5 ML SYRINGE
INTRAMUSCULAR | Status: DC | PRN
  Administered 2020-08-05: 100 mg via INTRAVENOUS

## 2020-08-05 MED ORDER — LACTATED RINGERS IV SOLN: INTRAVENOUS | Status: DC | PRN

## 2020-08-05 MED ORDER — ROCURONIUM BROMIDE 10 MG/ML (PF) SYRINGE
PREFILLED_SYRINGE | INTRAVENOUS | Status: DC | PRN
  Administered 2020-08-05: 50 mg via INTRAVENOUS
  Administered 2020-08-05: 20 mg via INTRAVENOUS

## 2020-08-05 MED ORDER — ONDANSETRON HCL 4 MG/2ML IJ SOLN
INTRAMUSCULAR | Status: DC | PRN
  Administered 2020-08-05: 4 mg via INTRAVENOUS

## 2020-08-05 MED ORDER — LACTATED RINGERS IV SOLN: INTRAVENOUS | Status: DC

## 2020-08-05 MED ORDER — PHENYLEPHRINE HCL-NACL 10-0.9 MG/250ML-% IV SOLN
INTRAVENOUS | Status: DC | PRN
  Administered 2020-08-05: 40 ug/min via INTRAVENOUS

## 2020-08-05 MED ORDER — FENTANYL CITRATE (PF) 250 MCG/5ML IJ SOLN
INTRAMUSCULAR | Status: DC | PRN
  Administered 2020-08-05: 50 ug via INTRAVENOUS

## 2020-08-05 MED ORDER — PROPOFOL 10 MG/ML IV BOLUS
INTRAVENOUS | Status: DC | PRN
  Administered 2020-08-05: 120 mg via INTRAVENOUS

## 2020-08-05 MED ORDER — CHLORHEXIDINE GLUCONATE 0.12 % MT SOLN
OROMUCOSAL | Status: AC
  Filled 2020-08-05: qty 15

## 2020-08-05 MED ORDER — SUGAMMADEX SODIUM 200 MG/2ML IV SOLN
INTRAVENOUS | Status: DC | PRN
  Administered 2020-08-05: 200 mg via INTRAVENOUS

## 2020-08-05 MED ORDER — MIDAZOLAM HCL 5 MG/5ML IJ SOLN
INTRAMUSCULAR | Status: DC | PRN
  Administered 2020-08-05: 1 mg via INTRAVENOUS

## 2020-08-05 SURGICAL SUPPLY — 45 items
ADAPTER BRONCH F/PENTAX (ADAPTER) ×3 IMPLANT
ADAPTER VALVE BIOPSY EBUS (MISCELLANEOUS) IMPLANT
ADPTR VALVE BIOPSY EBUS (MISCELLANEOUS)
BRUSH CYTOL CELLEBRITY 1.5X140 (MISCELLANEOUS) ×3 IMPLANT
BRUSH SUPERTRAX BIOPSY (INSTRUMENTS) IMPLANT
BRUSH SUPERTRAX NDL-TIP CYTO (INSTRUMENTS) ×3 IMPLANT
CANISTER SUCT 3000ML PPV (MISCELLANEOUS) ×3 IMPLANT
CHANNEL WORK EXTEND EDGE 180 (KITS) IMPLANT
CHANNEL WORK EXTEND EDGE 45 (KITS) IMPLANT
CHANNEL WORK EXTEND EDGE 90 (KITS) IMPLANT
CONT SPEC 4OZ CLIKSEAL STRL BL (MISCELLANEOUS) ×3 IMPLANT
COVER BACK TABLE 60X90IN (DRAPES) ×3 IMPLANT
FILTER STRAW FLUID ASPIR (MISCELLANEOUS) IMPLANT
FORCEPS BIOP SUPERTRX PREMAR (INSTRUMENTS) ×3 IMPLANT
GAUZE SPONGE 4X4 12PLY STRL (GAUZE/BANDAGES/DRESSINGS) ×3 IMPLANT
GLOVE SURG SS PI 7.5 STRL IVOR (GLOVE) ×6 IMPLANT
GOWN STRL REUS W/ TWL LRG LVL3 (GOWN DISPOSABLE) ×4 IMPLANT
GOWN STRL REUS W/TWL LRG LVL3 (GOWN DISPOSABLE) ×2
KIT CLEAN ENDO COMPLIANCE (KITS) ×3 IMPLANT
KIT LOCATABLE GUIDE (CANNULA) IMPLANT
KIT MARKER FIDUCIAL DELIVERY (KITS) IMPLANT
KIT PROCEDURE EDGE 180 (KITS) IMPLANT
KIT PROCEDURE EDGE 45 (KITS) IMPLANT
KIT PROCEDURE EDGE 90 (KITS) IMPLANT
KIT TURNOVER KIT B (KITS) ×3 IMPLANT
MARKER SKIN DUAL TIP RULER LAB (MISCELLANEOUS) ×3 IMPLANT
NEEDLE SUPERTRX PREMARK BIOPSY (NEEDLE) ×3 IMPLANT
NS IRRIG 1000ML POUR BTL (IV SOLUTION) ×3 IMPLANT
OIL SILICONE PENTAX (PARTS (SERVICE/REPAIRS)) ×3 IMPLANT
PAD ARMBOARD 7.5X6 YLW CONV (MISCELLANEOUS) ×6 IMPLANT
PATCHES PATIENT (LABEL) ×9 IMPLANT
SOL ANTI FOG 6CC (MISCELLANEOUS) ×2 IMPLANT
SOLUTION ANTI FOG 6CC (MISCELLANEOUS) ×1
SYR 20CC LL (SYRINGE) ×3 IMPLANT
SYR 20ML ECCENTRIC (SYRINGE) ×3 IMPLANT
SYR 50ML SLIP (SYRINGE) ×3 IMPLANT
SuperLock Fiducial Marker ×6 IMPLANT
TOWEL OR 17X24 6PK STRL BLUE (TOWEL DISPOSABLE) ×3 IMPLANT
TRAP SPECIMEN MUCOUS 40CC (MISCELLANEOUS) IMPLANT
TUBE CONNECTING 20X1/4 (TUBING) ×3 IMPLANT
UNDERPAD 30X30 (UNDERPADS AND DIAPERS) ×3 IMPLANT
VALVE BIOPSY  SINGLE USE (MISCELLANEOUS) ×1
VALVE BIOPSY SINGLE USE (MISCELLANEOUS) ×2 IMPLANT
VALVE SUCTION BRONCHIO DISP (MISCELLANEOUS) ×3 IMPLANT
WATER STERILE IRR 1000ML POUR (IV SOLUTION) ×3 IMPLANT

## 2020-08-05 NOTE — Op Note (Addendum)
Video Bronchoscopy with Electromagnetic Navigation with fiducial placement procedure Note  Date of Operation: 08/05/2020  Pre-op Diagnosis: Right upper lobe nodule  Post-op Diagnosis: Right upper lobe nodule  Surgeon: Garner Nash, DO  Assistants: None   Anesthesia: General endotracheal anesthesia  Operation: Flexible video fiberoptic bronchoscopy with electromagnetic navigation and biopsies.  Estimated Blood Loss: Minimal  Complications: None   Indications and History: Jordan Mahoney is a 70 y.o. female with .  The risks, benefits, complications, treatment options and expected outcomes were discussed with the patient.  The possibilities of pneumothorax, pneumonia, reaction to medication, pulmonary aspiration, perforation of a viscus, bleeding, failure to diagnose a condition and creating a complication requiring transfusion or operation were discussed with the patient who freely signed the consent.    Description of Procedure: The patient was seen in the Preoperative Area, was examined and was deemed appropriate to proceed.  The patient was taken to [facility] endoscopy room 2, identified as [name] and the procedure verified as Flexible Video Fiberoptic Bronchoscopy.  A Time Out was held and the above information confirmed.   Prior to the date of the procedure a high-resolution CT scan of the chest was performed. Utilizing Nathalie a virtual tracheobronchial tree was generated to allow the creation of distinct navigation pathways to the patient's parenchymal abnormalities. After being taken to the operating room general anesthesia was initiated and the patient  was orally intubated. The video fiberoptic bronchoscope was introduced via the endotracheal tube and a general inspection was performed which showed normal right and left lung anatomy no evidence of endobronchial lesion. The extendable working channel and locator guide were introduced into the bronchoscope. The  distinct navigation pathways prepared prior to this procedure were then utilized to navigate to within 0.4 cm of patient's lesion(s) identified on CT scan.  Full fluoroscopic sleep was obtained with an inspiratory breath-hold APL at 20 cm of water from RAO 25 degrees to LAO 25 degrees. the extendable working channel was secured into place and the locator guide was withdrawn. Under fluoroscopic guidance transbronchial needle brushings, transbronchial Wang needle biopsies, and transbronchial forceps biopsies were performed to be sent for cytology and pathology.  Following tissue sampling 2 fiducials were placed with approximate 2 cm of the lesional center identified by CT imaging under fluoroscopic guidance with fiducial wire catheter delivery kit.  A bronchioalveolar lavage was performed in the right upper lobe and sent for cytology.  Standard therapeutic bronchoscope was used for aspiration of the bilateral mainstem's and removal of any remaining blood clots and debris.  All distal subsegments were patent at the termination of the procedure.  At the end of the procedure a general airway inspection was performed and there was no evidence of active bleeding. The bronchoscope was removed.  The patient tolerated the procedure well. There was no significant blood loss and there were no obvious complications. A post-procedural chest x-ray is pending.  Samples: 1. Transbronchial needle brushings from right upper lobe 2. Transbronchial Wang needle biopsies from right upper lobe 3. Transbronchial forceps biopsies from right upper lobe 4. Bronchoalveolar lavage from right upper lobe  Plans:  The patient will be discharged from the PACU to home when recovered from anesthesia and after chest x-ray is reviewed. We will review the cytology, pathology and microbiology results with the patient when they become available. Outpatient followup will be with Garner Nash, DO.   Garner Nash, DO [facility] 08/05/2020 8:52 AM

## 2020-08-05 NOTE — Anesthesia Postprocedure Evaluation (Signed)
Anesthesia Post Note  Patient: Jordan Mahoney  Procedure(s) Performed: VIDEO BRONCHOSCOPY WITH ENDOBRONCHIAL NAVIGATION (Right) BRONCHIAL BIOPSIES BRONCHIAL BRUSHINGS BRONCHIAL NEEDLE ASPIRATION BIOPSIES BRONCHIAL WASHINGS FIDUCIAL MARKER PLACEMENT     Patient location during evaluation: PACU Anesthesia Type: General Level of consciousness: awake and alert Pain management: pain level controlled Vital Signs Assessment: post-procedure vital signs reviewed and stable Respiratory status: spontaneous breathing, nonlabored ventilation, respiratory function stable and patient connected to nasal cannula oxygen Cardiovascular status: blood pressure returned to baseline and stable Postop Assessment: no apparent nausea or vomiting Anesthetic complications: no   No notable events documented.  Last Vitals:  Vitals:   08/05/20 0912 08/05/20 0924  BP: 125/81 112/79  Pulse: 68 62  Resp: 16 12  Temp: 36.4 C   SpO2: 100% 100%    Last Pain:  Vitals:   08/05/20 0912  TempSrc:   PainSc: 0-No pain                 Renn Stille

## 2020-08-05 NOTE — Interval H&P Note (Signed)
History and Physical Interval Note:  08/05/2020 7:09 AM  Jordan Mahoney  has presented today for surgery, with the diagnosis of lung nodule.  The various methods of treatment have been discussed with the patient and family. After consideration of risks, benefits and other options for treatment, the patient has consented to  Procedure(s): [place] (Right) as a surgical intervention.  The patient's history has been reviewed, patient examined, no change in status, stable for surgery.  I have reviewed the patient's chart and labs.  Questions were answered to the patient's satisfaction.     [name]

## 2020-08-05 NOTE — H&P (Signed)
Synopsis: Referred in May 2022 for lung nodule by Ludwig Clarks, FNP   Subjective:    PATIENT ID: Jordan Mahoney GENDER: female DOB: Feb 07, 1951, MRN: [id number]       Chief Complaint  Patient presents with  . Consult      Lung nodule, referred by MW.       PMH of colon ca, stage IIa s/p resection at [facility], no chemo, ct scans at that time found the lung nodule. Has had subq follow up. Former smoker, quit in 1983. Also has thyroid disease. No family history of lung cancer.  Initial CT imaging revealed a small subcentimeter lesion in the right lung in 2020.  This has been followed.  On yearly CT imaging has showed growth.  Her most recent CT revealed an approximate 8 mm right upper lobe nodule that has lobulated margins.   Today in the office we reviewed her CT imaging from 2020 until now.  She has no significant respiratory complaints she quit smoking several years ago.  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 Allergic rhinoconjunctivitis    . Allergies    . Back pain    . Chronic uveitis    . Frequent UTI    . GERD (gastroesophageal reflux disease)    . HA (headache)    . Hearing loss      wearing bilateral aides  . Lactose intolerance    . Migraine    . Osteopenia    . Pre-diabetes    . 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 Sensorineural hearing loss    . Tingling of both feet    . Tinnitus             Family History  Problem Relation Age of Onset  . Diabetes Father    . Stroke Father    . Squamous cell carcinoma Mother    . Migraines Daughter    . Breast cancer Paternal Aunt    . Allergic  rhinitis Neg Hx    . Angioedema Neg Hx    . Asthma Neg Hx    . Eczema Neg Hx    . Immunodeficiency Neg Hx    . Urticaria Neg Hx             Past Surgical History:  Procedure Laterality Date  . ABDOMINAL HYSTERECTOMY      . belly button      . c sections      . CATARACT EXTRACTION Right    . CHOLECYSTECTOMY      . COLON RESECTION N/A 04/17/2018    Procedure: LAPAROSCOPIC COLON RESECTION POSSIBLE OSTOMY;  Surgeon: Benjamine Sprague, DO;  Location: [facility] ORS;  Service: General;  Laterality: N/A;  . ELBOW SURGERY Left    . GANGLION CYST EXCISION Left 01/22/2016    base of thumb  . GASTRIC BYPASS      . GLAUCOMA SURGERY      . GLAUCOMA SURGERY   12/13/2019    right eye  .  HERNIA REPAIR      . neck fusion   07/04/2017    Dr. Carloyn Manner in [place]  . SPINE SURGERY      . WRIST SURGERY Right        Social History         Socioeconomic History  . Marital status: Married      Spouse name: Not on file  . Number of children: 2  . Years of education: college  . Highest education level: Not on file  Occupational History      Comment: retired  Tobacco Use  . Smoking status: Former Smoker      Packs/day: 0.50      Years: 10.00      Pack years: 5.00      Types: Cigarettes      Quit date: 1982      Years since quitting: 40.3  . Smokeless tobacco: Never Used  Substance and Sexual Activity  . Alcohol use: Yes      Alcohol/week: 1.0 standard drink      Types: 1 Cans of beer per week      Comment: Rare  . Drug use: No  . Sexual activity: Not on file  Other Topics Concern  . Not on file  Social History Narrative    Patient lives at home with her husband [name]).    Education two years of college.    Caffeine - two glasses of tea.    Social Determinants of Health    Financial Resource Strain: Not on file  Food Insecurity: Not on file  Transportation Needs: Not on file  Physical Activity: Not on file  Stress: Not on file  Social Connections: Not on file  Intimate Partner Violence: Not  on file           Allergies  Allergen Reactions  . Latex        Rash/hives  . Sulfa Antibiotics Other (See Comments)      Causes skin reaction ( on her mid back)  Pigmentation.  . Tape    . Valium [Diazepam]        Violent dreams            Outpatient Medications Prior to Visit  Medication Sig Dispense Refill  . azelastine (ASTELIN) 0.1 % nasal spray Place 2 sprays into both nostrils daily. 90 mL 3  . brimonidine (ALPHAGAN) 0.2 % ophthalmic solution Place into the right eye 3 (three) times daily.      [name] CALCIUM CITRATE PO Take 500 mg by mouth 2 (two) times daily. Bariatric      . dorzolamide-timolol (COSOPT) 22.3-6.8 MG/ML ophthalmic solution Place 1 drop into the right eye 2 (two) times daily.      [name] eletriptan (RELPAX) 40 MG tablet Take 1 tablet at the onset of migraine. May repeat in 2 hours if headache persists or recurs. Not to exceed 2 tabs/24hours. 9 tablet 12  . Ergocalciferol (VITAMIN D2) 2000 UNITS TABS Take 2,000 Units by mouth daily.      . folic acid (FOLVITE) 1 MG tablet Take 1 mg by mouth daily.      . Fremanezumab-vfrm (AJOVY) 225 MG/1.5ML SOAJ Inject 225 mg into the skin every 30 (thirty) days. 1.5 mL 11  . gabapentin (NEURONTIN) 400 MG capsule Take 1 capsule (400 mg total) by mouth 2 (two) times daily. 180 capsule 3  . levothyroxine (SYNTHROID) 75 MCG tablet Take 1 tablet (75 mcg total) by mouth daily before breakfast. 90 tablet  1  . Magnesium Oxide 400 MG CAPS Take 400 mg by mouth 2 (two) times daily.      . methotrexate (RHEUMATREX) 2.5 MG tablet As directed by doctor.      . Multiple Vitamin (MULTIVITAMIN) tablet Take 1 tablet by mouth 2 (two) times daily. Bariatric      . Omega-3 Krill Oil 500 MG CAPS Take 1 capsule by mouth daily.      [name] omeprazole (PRILOSEC) 20 MG capsule Take 1 capsule (20 mg total) by mouth in the morning and at bedtime. 60 capsule 5  . solifenacin (VESICARE) 10 MG tablet Take 10 mg by mouth daily.      [name] tiZANidine (ZANAFLEX) 4 MG capsule  Take 4 mg by mouth 3 (three) times daily as needed for muscle spasms. Take 1 capsule (4 mg total) by mouth Three (3) times a day as needed      . vitamin B-12 (CYANOCOBALAMIN) 100 MCG tablet Take 100 mcg by mouth daily.      . vitamin C (ASCORBIC ACID) 500 MG tablet Take 500 mg by mouth 2 (two) times daily.      . cyproheptadine (PERIACTIN) 4 MG tablet Take 1 tablet (4 mg total) by mouth at bedtime. 30 tablet 3    No facility-administered medications prior to visit.      Review of Systems Review of Systems  Constitutional:  Negative for chills, fever, malaise/fatigue and weight loss.  HENT:  Negative for hearing loss, sore throat and tinnitus.   Eyes:  Negative for blurred vision and double vision.  Respiratory:  Negative for cough, hemoptysis, sputum production, shortness of breath, wheezing and stridor.   Cardiovascular:  Negative for chest pain, palpitations, orthopnea, leg swelling and PND.  Gastrointestinal:  Negative for abdominal pain, constipation, diarrhea, heartburn, nausea and vomiting.  Genitourinary:  Negative for dysuria, hematuria and urgency.  Musculoskeletal:  Negative for joint pain and myalgias.  Skin:  Negative for itching and rash.  Neurological:  Negative for dizziness, tingling, weakness and headaches.  Endo/Heme/Allergies:  Negative for environmental allergies. Does not bruise/bleed easily.  Psychiatric/Behavioral:  Negative for depression. The patient is not nervous/anxious and does not have insomnia.   All other systems reviewed and are negative.      Objective:   General appearance: 70 y.o., female, NAD, conversant  Eyes: anicteric sclerae, moist conjunctivae; no lid-lag; PERRLA, tracking appropriately HENT: NCAT; oropharynx, MMM, no mucosal ulcerations; normal hard and soft palate Neck: Trachea midline; FROM, supple, lymphadenopathy, no JVD Lungs: CTAB, no crackles, no wheeze, with normal respiratory effort and no intercostal retractions CV: RRR, S1, S2, no  MRGs  Abdomen: Soft, non-tender; non-distended, BS present  Extremities: No peripheral edema, radial and DP pulses present bilaterally  Skin: Normal temperature, turgor and texture; no rash Psych: Appropriate affect Neuro: Alert and oriented to person and place, no focal deficit       BP (!) 148/89   Pulse 69   Temp 98.4 F (36.9 C) (Oral)   Ht 5\' 6"  (1.676 m)   Wt 88 kg   SpO2 100%   BMI 31.31 kg/m    97% on RA    BMI Readings from Last 3 Encounters:  06/25/20 31.76 kg/m  06/23/20 31.31 kg/m  05/22/20 31.31 kg/m       Wt Readings from Last 3 Encounters:  06/25/20 196 lb 12.8 oz (89.3 kg)  06/23/20 194 lb (88 kg)  05/22/20 194 lb (88 kg)      Chest Imaging: 06/13/2020  CT chest: 8 x 6 x 9 mm right upper lobe pulmonary nodule that abuts the minor fissure.  Stable from March imaging however substantially increased in size from February 2020. The patient's images have been independently reviewed by me.     Pulmonary Functions Testing Results: No flowsheet data found.   FeNO:    Pathology:  Echocardiogram:    Heart Catheterization:     Assessment & Plan:        ICD-10-CM    1. Right upper lobe pulmonary nodule R91.1 Ambulatory referral to Cardiothoracic Surgery      NM PET Image Initial (PI) Skull Base To Thigh      Pulmonary Function Test  2. Former smoker Z87.891    3. History of colon cancer Z85.038        Discussion:   This is a 70 year old female, history of colon cancer, stage IIa in 2020.  She is a former smoker quit 1983 has a small right upper lobe pulmonary nodule that has shown growth from 2020 until now.  It is now approximately 8 mm in largest cross-section.   Plan:  Here today for planned outpatient bronchoscopy  She was seen by Dr. Kipp Brood Decisions were made to proceed with biopsy first. Therefore we discussed R/B/A of proceeding with ENB to include PTX and Bleeding  She is agreeable to proceed today.     Garner Nash,  DO [facility] 08/05/2020 7:05 AM

## 2020-08-05 NOTE — Transfer of Care (Signed)
Immediate Anesthesia Transfer of Care Note  Patient: Jordan Mahoney  Procedure(s) Performed: VIDEO BRONCHOSCOPY WITH ENDOBRONCHIAL NAVIGATION (Right)  Patient Location: PACU  Anesthesia Type:General  Level of Consciousness: awake, alert , patient cooperative and responds to stimulation  Airway & Oxygen Therapy: Patient Spontanous Breathing and Patient connected to face mask oxygen  Post-op Assessment: Report given to RN, Post -op Vital signs reviewed and stable and Patient moving all extremities X 4  Post vital signs: Reviewed and stable  Last Vitals:  Vitals Value Taken Time  BP 139/79 08/05/20 0842  Temp 36.4 C 08/05/20 0842  Pulse 77 08/05/20 0845  Resp 16 08/05/20 0845  SpO2 100 % 08/05/20 0845  Vitals shown include unvalidated device data.  Last Pain:  Vitals:   08/05/20 0842  TempSrc:   PainSc: 0-No pain      Patients Stated Pain Goal: 3 (25/63/89 3734)  Complications: No notable events documented.

## 2020-08-06 ENCOUNTER — Encounter (HOSPITAL_COMMUNITY): Payer: Self-pay | Admitting: Pulmonary Disease

## 2020-08-06 LAB — CYTOLOGY - NON PAP

## 2020-08-07 LAB — CYTOLOGY - NON PAP

## 2020-08-08 ENCOUNTER — Other Ambulatory Visit (HOSPITAL_COMMUNITY): Payer: Medicare Other

## 2020-08-08 NOTE — Progress Notes (Signed)
I called and spoke with the patient regarding pathology results. Please ensure some day next week that she has an appointment to follow up with Dr. Kipp Brood. I sent a telephone message regarding this as well.

## 2020-08-12 DIAGNOSIS — R911 Solitary pulmonary nodule: Secondary | ICD-10-CM | POA: Insufficient documentation

## 2020-08-14 ENCOUNTER — Other Ambulatory Visit: Payer: Self-pay | Admitting: *Deleted

## 2020-08-14 NOTE — Progress Notes (Signed)
The proposed treatment discussed in cancer conference today is for discussion purpose only and is not a binding recommendation.  The patient was not physically examined nor present for their treatment options.  Therefore, final treatment plans cannot be decided.

## 2020-08-20 ENCOUNTER — Ambulatory Visit (INDEPENDENT_AMBULATORY_CARE_PROVIDER_SITE_OTHER): Payer: Medicare Other

## 2020-08-20 DIAGNOSIS — J309 Allergic rhinitis, unspecified: Secondary | ICD-10-CM

## 2020-08-22 ENCOUNTER — Other Ambulatory Visit: Payer: Self-pay

## 2020-08-22 ENCOUNTER — Telehealth (INDEPENDENT_AMBULATORY_CARE_PROVIDER_SITE_OTHER): Payer: Medicare Other | Admitting: Thoracic Surgery (Cardiothoracic Vascular Surgery)

## 2020-08-22 ENCOUNTER — Encounter: Payer: Self-pay | Admitting: *Deleted

## 2020-08-22 ENCOUNTER — Other Ambulatory Visit: Payer: Self-pay | Admitting: *Deleted

## 2020-08-22 DIAGNOSIS — R911 Solitary pulmonary nodule: Secondary | ICD-10-CM

## 2020-08-22 DIAGNOSIS — Z01812 Encounter for preprocedural laboratory examination: Secondary | ICD-10-CM

## 2020-08-22 DIAGNOSIS — C3490 Malignant neoplasm of unspecified part of unspecified bronchus or lung: Secondary | ICD-10-CM | POA: Diagnosis not present

## 2020-08-22 DIAGNOSIS — C3411 Malignant neoplasm of upper lobe, right bronchus or lung: Secondary | ICD-10-CM

## 2020-08-22 NOTE — Progress Notes (Signed)
     [address]       [place] [address]             [phone]       Patient: Home Provider: Office Consent for Telemedicine visit obtained.  Today's visit was completed via a real-time telehealth (see specific modality noted below). The patient/authorized person provided oral consent at the time of the visit to engage in a telemedicine encounter with the present provider at [facility]. The patient/authorized person was informed of the potential benefits, limitations, and risks of telemedicine. The patient/authorized person expressed understanding that the laws that protect confidentiality also apply to telemedicine. The patient/authorized person acknowledged understanding that telemedicine does not provide emergency services and that he or she would need to call 911 or proceed to the nearest hospital for help if such a need arose.   Total time spent in the clinical discussion 10 minutes.  Telehealth Modality: Phone visit (audio only)  I had a telephone visit with Jordan Mahoney.  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  We discussed the risks and benefits of a robotic assisted right upper lobectomy and she is agreeable to proceed.  All of her questions were answered.  She is tentatively scheduled for July 18.  [name]

## 2020-08-25 ENCOUNTER — Ambulatory Visit (INDEPENDENT_AMBULATORY_CARE_PROVIDER_SITE_OTHER): Payer: Medicare Other | Admitting: Pulmonary Disease

## 2020-08-25 ENCOUNTER — Other Ambulatory Visit: Payer: Self-pay

## 2020-08-25 VITALS — BP 120/80 | HR 58 | Ht 66.0 in | Wt 197.0 lb

## 2020-08-25 DIAGNOSIS — C3491 Malignant neoplasm of unspecified part of right bronchus or lung: Secondary | ICD-10-CM | POA: Diagnosis not present

## 2020-08-25 DIAGNOSIS — R911 Solitary pulmonary nodule: Secondary | ICD-10-CM | POA: Diagnosis not present

## 2020-08-25 DIAGNOSIS — Z87891 Personal history of nicotine dependence: Secondary | ICD-10-CM | POA: Diagnosis not present

## 2020-08-25 DIAGNOSIS — Z85038 Personal history of other malignant neoplasm of large intestine: Secondary | ICD-10-CM | POA: Diagnosis not present

## 2020-08-25 NOTE — Patient Instructions (Addendum)
Thank you for visiting Dr. Valeta Harms at [facility]. Today we recommend the following:  Return in about 6 months (around 02/25/2021) for w/ Dr. Valeta Harms .    Please do your part to reduce the spread of COVID-19.

## 2020-08-25 NOTE — Progress Notes (Signed)
Synopsis: Referred in May 2022 for lung nodule by Ludwig Clarks, FNP  Subjective:   PATIENT ID: Jordan Mahoney GENDER: female DOB: Apr 29, 1950, MRN: [id number]  Chief Complaint  Patient presents with   Lung Mass    Surgery Monday 7/18    PMH of colon ca, stage IIa s/p resection at [facility], no chemo, ct scans at that time found the lung nodule. Has had subq follow up. Former smoker, quit in 1983. Also has thyroid disease. No family history of lung cancer.  Initial CT imaging revealed a small subcentimeter lesion in the right lung in 2020.  This has been followed.  On yearly CT imaging has showed growth.  Her most recent CT revealed an approximate 8 mm right upper lobe nodule that has lobulated margins.  Today in the office we reviewed her CT imaging from 2020 until now.  She has no significant respiratory complaints she quit smoking several years ago.  OV 08/25/2020: Patient was here today for follow-up after bronchoscopy.  Initially had consultation with Dr. Kipp Brood but due to her history of colon cancer would prefer to have tissue diagnosis prior to consider resection.  Patient was taken for video bronchoscopy with endobronchial navigation.  Nodule actually turned out to be a primary lung cancer.  Patient has had a return visit with Dr. Kipp Brood and plans for robotic resection on 09/01/2020.  Patient is anxious about this upcoming procedure but thankful that we have a diagnosis and are able to move forward.   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[place])    05/04/2018 Cancer Staging   Staging form: Colon and Rectum, AJCC 8th Edition - Pathologic stage from 05/04/2018: Stage IIA (pT3, pN0, cM0) - Signed by Earlie Server, MD on 05/04/2018       Past Medical History:  Diagnosis Date   Allergic rhinoconjunctivitis    Allergies    Arthritis    Back pain    Chronic uveitis    Depression    Frequent UTI     GERD (gastroesophageal reflux disease)    HA (headache)    Hearing loss    wearing bilateral aides   Lactose intolerance    Migraine    Osteopenia    Pre-diabetes    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[place])    Sensorineural hearing loss    Tingling of both feet    Tinnitus      Family History  Problem Relation Age of Onset   Diabetes Father    Stroke Father    Squamous cell carcinoma Mother    Migraines Daughter    Breast cancer Paternal Aunt    Allergic rhinitis Neg Hx    Angioedema Neg Hx    Asthma Neg Hx    Eczema Neg Hx    Immunodeficiency Neg Hx    Urticaria Neg Hx      Past Surgical History:  Procedure Laterality Date   ABDOMINAL HYSTERECTOMY     belly button     BRONCHIAL BIOPSY  08/05/2020   Procedure: BRONCHIAL BIOPSIES;  Surgeon: Garner Nash, DO;  Location: [facility];  Service: Pulmonary;;   BRONCHIAL BRUSHINGS  08/05/2020   Procedure: BRONCHIAL BRUSHINGS;  Surgeon: Garner Nash, DO;  Location: [facility];  Service: Pulmonary;;   BRONCHIAL NEEDLE ASPIRATION BIOPSY  08/05/2020   Procedure: BRONCHIAL NEEDLE ASPIRATION BIOPSIES;  Surgeon: Garner Nash, DO;  Location: [facility];  Service: Pulmonary;;   BRONCHIAL  WASHINGS  08/05/2020   Procedure: BRONCHIAL WASHINGS;  Surgeon: Garner Nash, DO;  Location: [facility] [facility];  Service: Pulmonary;;   c sections     CATARACT EXTRACTION Right    CHOLECYSTECTOMY     COLON RESECTION N/A 04/17/2018   Procedure: LAPAROSCOPIC COLON RESECTION POSSIBLE OSTOMY;  Surgeon: Benjamine Sprague, DO;  Location: [facility];  Service: General;  Laterality: N/A;   COLONOSCOPY WITH PROPOFOL N/A 07/23/2020   Procedure: COLONOSCOPY WITH PROPOFOL;  Surgeon: Toledo, Benay Pike, MD;  Location: [facility];  Service: Gastroenterology;  Laterality: N/A;   ELBOW SURGERY Left    FIDUCIAL MARKER PLACEMENT  08/05/2020   Procedure: FIDUCIAL MARKER PLACEMENT;  Surgeon: Garner Nash, DO;  Location: [facility];  Service: Pulmonary;;   GANGLION CYST  EXCISION Left 01/22/2016   base of thumb   GASTRIC BYPASS     GLAUCOMA SURGERY     GLAUCOMA SURGERY  12/13/2019   right eye   HERNIA REPAIR     neck fusion  07/04/2017   Dr. Carloyn Manner in [place] Right 08/05/2020   Procedure: [place];  Surgeon: Garner Nash, DO;  Location: [facility];  Service: Pulmonary;  Laterality: Right;   WRIST SURGERY Right     Social History   Socioeconomic History   Marital status: Married    Spouse name: Not on file   Number of children: 2   Years of education: college   Highest education level: Not on file  Occupational History    Comment: retired  Tobacco Use   Smoking status: Former    Packs/day: 0.50    Years: 10.00    Pack years: 5.00    Types: Cigarettes    Quit date: 1982    Years since quitting: 40.5   Smokeless tobacco: Never  Vaping Use   Vaping Use: Never used  Substance and Sexual Activity   Alcohol use: Not Currently    Alcohol/week: 1.0 standard drink    Types: 1 Cans of beer per week   Drug use: No   Sexual activity: Not on file  Other Topics Concern   Not on file  Social History Narrative   Patient lives at home with her husband [name]).    Education two years of college.   Caffeine - two glasses of tea.   Social Determinants of Health   Financial Resource Strain: Not on file  Food Insecurity: Not on file  Transportation Needs: Not on file  Physical Activity: Not on file  Stress: Not on file  Social Connections: Not on file  Intimate Partner Violence: Not on file     Allergies  Allergen Reactions   Latex     Rash/hives   Sulfa Antibiotics Other (See Comments)    Causes skin reaction ( on her mid back)  Pigmentation.  A fixed drug reaction   Tape Other (See Comments)    Blister when pulled of or stays on too long   Valium [Diazepam]     Violent dreams   Other Itching and Rash    Metal contact  allergy Blister     Outpatient Medications Prior to Visit  Medication Sig Dispense Refill   azelastine (ASTELIN) 0.1 % nasal spray Place 2 sprays into both nostrils daily. (Patient taking differently: Place 2 sprays into both nostrils 2 (two) times daily.) 90 mL 3   CALCIUM CITRATE PO Take 500 mg by mouth daily.  Bariatric Advantage     cyproheptadine (PERIACTIN) 4 MG tablet Take 1 tablet (4 mg total) by mouth daily. 90 tablet 1   dorzolamide-timolol (COSOPT) 22.3-6.8 MG/ML ophthalmic solution Place 1 drop into the right eye 2 (two) times daily.     eletriptan (RELPAX) 40 MG tablet Take 1 tablet at the onset of migraine. May repeat in 2 hours if headache persists or recurs. Not to exceed 2 tabs/24hours. (Patient taking differently: Take 40 mg by mouth every 2 (two) hours as needed for migraine. 40 mg on setMay repeat in 2 hours if headache persists or recurs. Not to exceed 2 tabs/24hours.) 9 tablet 12   Ergocalciferol (VITAMIN D2) 2000 UNITS TABS Take 2,000 Units by mouth daily.      folic acid (FOLVITE) 1 MG tablet Take 1 mg by mouth daily.      Fremanezumab-vfrm (AJOVY) 225 MG/1.5ML SOAJ Inject 225 mg into the skin every 30 (thirty) days. 1.5 mL 11   gabapentin (NEURONTIN) 400 MG capsule Take 1 capsule (400 mg total) by mouth 2 (two) times daily. 180 capsule 3   levothyroxine (SYNTHROID) 75 MCG tablet Take 1 tablet (75 mcg total) by mouth daily before breakfast. 90 tablet 1   Magnesium Oxide 400 MG CAPS Take 400 mg by mouth daily.     methotrexate (RHEUMATREX) 2.5 MG tablet Take 25 mg by mouth once a week. Monday  As directed by doctor.     Multiple Vitamin (MULTIVITAMIN) tablet Take 1 tablet by mouth 2 (two) times daily. Bariatric Advantage     Omega-3 Krill Oil 500 MG CAPS Take 500 mg by mouth daily.     omeprazole (PRILOSEC) 20 MG capsule Take 1 capsule (20 mg total) by mouth in the morning and at bedtime. 60 capsule 5   prednisoLONE acetate (PRED FORTE) 1 % ophthalmic suspension Place 1  drop into the right eye daily.     Riboflavin (VITAMIN B-2 PO) Take 1 tablet by mouth daily.     solifenacin (VESICARE) 10 MG tablet Take 10 mg by mouth daily.     tiZANidine (ZANAFLEX) 4 MG capsule Take 4 mg by mouth 3 (three) times daily as needed for muscle spasms. Take 1 capsule (4 mg total) by mouth Three (3) times a day as needed     vitamin B-12 (CYANOCOBALAMIN) 100 MCG tablet Take 100 mcg by mouth daily.     vitamin C (ASCORBIC ACID) 500 MG tablet Take 500 mg by mouth 2 (two) times daily.     No facility-administered medications prior to visit.    Review of Systems  Constitutional:  Negative for chills, fever, malaise/fatigue and weight loss.  HENT:  Negative for hearing loss, sore throat and tinnitus.   Eyes:  Negative for blurred vision and double vision.  Respiratory:  Negative for cough, hemoptysis, sputum production, shortness of breath, wheezing and stridor.   Cardiovascular:  Negative for chest pain, palpitations, orthopnea, leg swelling and PND.  Gastrointestinal:  Negative for abdominal pain, constipation, diarrhea, heartburn, nausea and vomiting.  Genitourinary:  Negative for dysuria, hematuria and urgency.  Musculoskeletal:  Negative for joint pain and myalgias.  Skin:  Negative for itching and rash.  Neurological:  Negative for dizziness, tingling, weakness and headaches.  Endo/Heme/Allergies:  Negative for environmental allergies. Does not bruise/bleed easily.  Psychiatric/Behavioral:  Negative for depression. The patient is nervous/anxious. The patient does not have insomnia.   All other systems reviewed and are negative.   Objective:  Physical Exam Vitals reviewed.  Constitutional:  General: She is not in acute distress.    Appearance: She is well-developed. She is obese.  HENT:     Head: Normocephalic and atraumatic.  Eyes:     General: No scleral icterus.    Conjunctiva/sclera: Conjunctivae normal.     Pupils: Pupils are equal, round, and reactive to  light.  Neck:     Vascular: No JVD.     Trachea: No tracheal deviation.  Cardiovascular:     Rate and Rhythm: Normal rate and regular rhythm.     Heart sounds: Normal heart sounds. No murmur heard. Pulmonary:     Effort: Pulmonary effort is normal. No tachypnea, accessory muscle usage or respiratory distress.     Breath sounds: No stridor. No wheezing, rhonchi or rales.  Abdominal:     General: Bowel sounds are normal. There is no distension.     Palpations: Abdomen is soft.     Tenderness: There is no abdominal tenderness.  Musculoskeletal:        General: No tenderness.     Cervical back: Neck supple.  Lymphadenopathy:     Cervical: No cervical adenopathy.  Skin:    General: Skin is warm and dry.     Capillary Refill: Capillary refill takes less than 2 seconds.     Findings: No rash.  Neurological:     Mental Status: She is alert and oriented to person, place, and time.  Psychiatric:        Behavior: Behavior normal.     Vitals:   08/25/20 1119  BP: 120/80  Pulse: (!) 58  SpO2: 96%  Weight: 197 lb (89.4 kg)  Height: 5\' 6"  (1.676 m)   96% on RA BMI Readings from Last 3 Encounters:  08/25/20 31.80 kg/m  08/05/20 31.31 kg/m  07/23/20 32.28 kg/m   Wt Readings from Last 3 Encounters:  08/25/20 197 lb (89.4 kg)  08/05/20 194 lb (88 kg)  07/23/20 200 lb (90.7 kg)     CBC    Component Value Date/Time   WBC 6.2 04/28/2020 0834   RBC 4.08 04/28/2020 0834   HGB 12.3 04/28/2020 0834   HCT 38.1 04/28/2020 0834   PLT 228 04/28/2020 0834   MCV 93.4 04/28/2020 0834   MCH 30.1 04/28/2020 0834   MCHC 32.3 04/28/2020 0834   RDW 13.6 04/28/2020 0834   LYMPHSABS 2.4 04/28/2020 0834   MONOABS 0.6 04/28/2020 0834   EOSABS 0.2 04/28/2020 0834   BASOSABS 0.1 04/28/2020 0834    Chest Imaging: 06/13/2020 CT chest: 8 x 6 x 9 mm right upper lobe pulmonary nodule that abuts the minor fissure.  Stable from March imaging however substantially increased in size from February  2020. The patient's images have been independently reviewed by me.    Pulmonary Functions Testing Results: PFT Results Latest Ref Rng & Units 07/11/2020  FVC-Pre L 2.89  FVC-Predicted Pre % 91  FVC-Post L 2.86  FVC-Predicted Post % 90  Pre FEV1/FVC % % 79  Post FEV1/FCV % % 84  FEV1-Pre L 2.28  FEV1-Predicted Pre % 93  FEV1-Post L 2.40  DLCO uncorrected ml/min/mmHg 20.18  DLCO UNC% % 74  DLCO corrected ml/min/mmHg 20.18  DLCO COR %Predicted % 74  DLVA Predicted % 79  TLC L 4.71  TLC % Predicted % 87  RV % Predicted % 95    FeNO:   Pathology:  Echocardiogram:   Heart Catheterization:     Assessment & Plan:     ICD-10-CM   1. Adenocarcinoma  of right lung, stage 1 (HCC)  C34.91     2. Lung nodule  R91.1     3. Right upper lobe pulmonary nodule  R91.1     4. Former smoker  Z87.891     5. History of colon cancer  Z85.038        Discussion:  This is a 70 year old female, history of colon cancer, stage IIa in 2020, former smoker quit 1983, small right upper lobe pulmonary nodule slow growth since 2020 now approximately 8 mm in size.  Patient taken for navigational bronchoscopy, tissue sampling positive for pulmonary adenocarcinoma.  Plan: Planned robotic lobectomy by Dr. Kipp Brood on 09/01/2020. Patient to follow-up with [place] after surgery. At some point she may need to consider bronchodilators for symptom management but at this time doing very well from a respiratory standpoint. Reviewed path today in the office and answered any questions that I could about her upcoming procedure    Current Outpatient Medications:    azelastine (ASTELIN) 0.1 % nasal spray, Place 2 sprays into both nostrils daily. (Patient taking differently: Place 2 sprays into both nostrils 2 (two) times daily.), Disp: 90 mL, Rfl: 3   CALCIUM CITRATE PO, Take 500 mg by mouth daily. Bariatric Advantage, Disp: , Rfl:    cyproheptadine (PERIACTIN) 4 MG tablet, Take 1 tablet (4 mg total) by mouth  daily., Disp: 90 tablet, Rfl: 1   dorzolamide-timolol (COSOPT) 22.3-6.8 MG/ML ophthalmic solution, Place 1 drop into the right eye 2 (two) times daily., Disp: , Rfl:    eletriptan (RELPAX) 40 MG tablet, Take 1 tablet at the onset of migraine. May repeat in 2 hours if headache persists or recurs. Not to exceed 2 tabs/24hours. (Patient taking differently: Take 40 mg by mouth every 2 (two) hours as needed for migraine. 40 mg on setMay repeat in 2 hours if headache persists or recurs. Not to exceed 2 tabs/24hours.), Disp: 9 tablet, Rfl: 12   Ergocalciferol (VITAMIN D2) 2000 UNITS TABS, Take 2,000 Units by mouth daily. , Disp: , Rfl:    folic acid (FOLVITE) 1 MG tablet, Take 1 mg by mouth daily. , Disp: , Rfl:    Fremanezumab-vfrm (AJOVY) 225 MG/1.5ML SOAJ, Inject 225 mg into the skin every 30 (thirty) days., Disp: 1.5 mL, Rfl: 11   gabapentin (NEURONTIN) 400 MG capsule, Take 1 capsule (400 mg total) by mouth 2 (two) times daily., Disp: 180 capsule, Rfl: 3   levothyroxine (SYNTHROID) 75 MCG tablet, Take 1 tablet (75 mcg total) by mouth daily before breakfast., Disp: 90 tablet, Rfl: 1   Magnesium Oxide 400 MG CAPS, Take 400 mg by mouth daily., Disp: , Rfl:    methotrexate (RHEUMATREX) 2.5 MG tablet, Take 25 mg by mouth once a week. Monday  As directed by doctor., Disp: , Rfl:    Multiple Vitamin (MULTIVITAMIN) tablet, Take 1 tablet by mouth 2 (two) times daily. Bariatric Advantage, Disp: , Rfl:    Omega-3 Krill Oil 500 MG CAPS, Take 500 mg by mouth daily., Disp: , Rfl:    omeprazole (PRILOSEC) 20 MG capsule, Take 1 capsule (20 mg total) by mouth in the morning and at bedtime., Disp: 60 capsule, Rfl: 5   prednisoLONE acetate (PRED FORTE) 1 % ophthalmic suspension, Place 1 drop into the right eye daily., Disp: , Rfl:    Riboflavin (VITAMIN B-2 PO), Take 1 tablet by mouth daily., Disp: , Rfl:    solifenacin (VESICARE) 10 MG tablet, Take 10 mg by mouth daily., Disp: , Rfl:  tiZANidine (ZANAFLEX) 4 MG  capsule, Take 4 mg by mouth 3 (three) times daily as needed for muscle spasms. Take 1 capsule (4 mg total) by mouth Three (3) times a day as needed, Disp: , Rfl:    vitamin B-12 (CYANOCOBALAMIN) 100 MCG tablet, Take 100 mcg by mouth daily., Disp: , Rfl:    vitamin C (ASCORBIC ACID) 500 MG tablet, Take 500 mg by mouth 2 (two) times daily., Disp: , Rfl:   I spent 20 minutes dedicated to the care of this patient on the date of this encounter to include pre-visit review of records, face-to-face time with the patient discussing conditions above, post visit ordering of testing, clinical documentation with the electronic health record, making appropriate referrals as documented, and communicating necessary findings to members of the patients care team.    Garner Nash, DO [facility] 08/25/2020 7:26 PM

## 2020-08-27 ENCOUNTER — Ambulatory Visit (INDEPENDENT_AMBULATORY_CARE_PROVIDER_SITE_OTHER): Payer: Medicare Other

## 2020-08-27 DIAGNOSIS — J309 Allergic rhinitis, unspecified: Secondary | ICD-10-CM | POA: Diagnosis not present

## 2020-08-28 NOTE — Progress Notes (Addendum)
Surgical Instructions    Your procedure is scheduled on 09/01/20.  Report to [facility] Main Entrance "A" at 05:30 A.M., then check in with the Admitting office.  Call this number if you have problems the morning of surgery:  [phone]   If you have any questions prior to your surgery date call [phone]: Open Monday-Friday 8am-4pm    Remember:  Do not eat or drink after midnight the night before your surgery      Take these medicines the morning of surgery with A SIP OF WATER  azelastine (ASTELIN) nasal spray dorzolamide-timolol (COSOPT) eye drops eletriptan (RELPAX)  gabapentin (NEURONTIN) levothyroxine (SYNTHROID) omeprazole (PRILOSEC)  prednisoLONE acetate (PRED FORTE) eye drops solifenacin (VESICARE) tiZANidine (ZANAFLEX) if needed for muscle spasms   As of today, STOP taking any Aspirin (unless otherwise instructed by your surgeon) Aleve, Naproxen, Ibuprofen, Motrin, Advil, Goody's, BC's, all herbal medications, fish oil, and all vitamins.  Stop taking methotrexate (RHEUMATREX) 3 days prior to surgery.   HOW TO MANAGE YOUR DIABETES BEFORE AND AFTER SURGERY  Why is it important to control my blood sugar before and after surgery? Improving blood sugar levels before and after surgery helps healing and can limit problems. A way of improving blood sugar control is eating a healthy diet by:  Eating less sugar and carbohydrates  Increasing activity/exercise  Talking with your doctor about reaching your blood sugar goals High blood sugars (greater than 180 mg/dL) can raise your risk of infections and slow your recovery, so you will need to focus on controlling your diabetes during the weeks before surgery. Make sure that the doctor who takes care of your diabetes knows about your planned surgery including the date and location.  How do I manage my blood sugar before surgery? Check your blood sugar at least 4 times a day, starting 2 days before surgery, to make sure  that the level is not too high or low.  Check your blood sugar the morning of your surgery when you wake up and every 2 hours until you get to the Short Stay unit.  If your blood sugar is less than 70 mg/dL, you will need to treat for low blood sugar: Do not take insulin. Treat a low blood sugar (less than 70 mg/dL) with  cup of clear juice (cranberry or apple), 4 glucose tablets, OR glucose gel. Recheck blood sugar in 15 minutes after treatment (to make sure it is greater than 70 mg/dL). If your blood sugar is not greater than 70 mg/dL on recheck, call [phone] for further instructions. Report your blood sugar to the short stay nurse when you get to Short Stay.  If you are admitted to the hospital after surgery: Your blood sugar will be checked by the staff and you will probably be given insulin after surgery (instead of oral diabetes medicines) to make sure you have good blood sugar levels. The goal for blood sugar control after surgery is 80-180 mg/dL.           Do not wear jewelry or makeup Do not wear lotions, powders, perfumes, or deodorant. Do not shave 48 hours prior to surgery.   Do not bring valuables to the hospital.  DO Not wear nail polish, gel polish, artificial nails, or any other type of covering on natural nails  including finger and toenails. If patients have artificial nails, gel coating, etc. that need to be removed by a nail salon please have this removed prior to surgery or surgery may need to  be canceled/delayed if the surgeon/ anesthesia feels like the patient is unable to be adequately monitored.             [facility] is not responsible for any belongings or valuables.  Do NOT Smoke (Tobacco/Vaping) or drink Alcohol 24 hours prior to your procedure If you use a CPAP at night, you may bring all equipment for your overnight stay.   Contacts, glasses, dentures or bridgework may not be worn into surgery, please bring cases for these belongings   For patients  admitted to the hospital, discharge time will be determined by your treatment team.   Patients discharged the day of surgery will not be allowed to drive home, and someone needs to stay with them for 24 hours.  ONLY 1 SUPPORT PERSON MAY BE PRESENT WHILE YOU ARE IN SURGERY. IF YOU ARE TO BE ADMITTED ONCE YOU ARE IN YOUR ROOM YOU WILL BE ALLOWED TWO (2) VISITORS.  Minor children may have two parents present. Special consideration for safety and communication needs will be reviewed on a case by case basis.  Special instructions:    Oral Hygiene is also important to reduce your risk of infection.  Remember - BRUSH YOUR TEETH THE MORNING OF SURGERY WITH YOUR REGULAR TOOTHPASTE   [facility]- Preparing For Surgery  Before surgery, you can play an important role. Because skin is not sterile, your skin needs to be as free of germs as possible. You can reduce the number of germs on your skin by washing with CHG (chlorahexidine gluconate) Soap before surgery.  CHG is an antiseptic cleaner which kills germs and bonds with the skin to continue killing germs even after washing.     Please do not use if you have an allergy to CHG or antibacterial soaps. If your skin becomes reddened/irritated stop using the CHG.  Do not shave (including legs and underarms) for at least 48 hours prior to first CHG shower. It is OK to shave your face.  Please follow these instructions carefully.     Shower the NIGHT BEFORE SURGERY and the MORNING OF SURGERY with CHG Soap.   If you chose to wash your hair, wash your hair first as usual with your normal shampoo. After you shampoo, rinse your hair and body thoroughly to remove the shampoo.  Then [employer] and genitals (private parts) with your normal soap and rinse thoroughly to remove soap.  After that Use CHG Soap as you would any other liquid soap. You can apply CHG directly to the skin and wash gently with a scrungie or a clean washcloth.   Apply the CHG Soap to your  body ONLY FROM THE NECK DOWN.  Do not use on open wounds or open sores. Avoid contact with your eyes, ears, mouth and genitals (private parts). Wash Face and genitals (private parts)  with your normal soap.   Wash thoroughly, paying special attention to the area where your surgery will be performed.  Thoroughly rinse your body with warm water from the neck down.  DO NOT shower/wash with your normal soap after using and rinsing off the CHG Soap.  Pat yourself dry with a CLEAN TOWEL.  Wear CLEAN PAJAMAS to bed the night before surgery  Place CLEAN SHEETS on your bed the night before your surgery  DO NOT SLEEP WITH PETS.   Day of Surgery: Take a shower with CHG soap. Wear Clean/Comfortable clothing the morning of surgery Do not apply any deodorants/lotions.   Remember to brush  your teeth WITH YOUR REGULAR TOOTHPASTE.   Please read over the following fact sheets that you were given.

## 2020-08-29 ENCOUNTER — Encounter (HOSPITAL_COMMUNITY): Payer: Self-pay

## 2020-08-29 ENCOUNTER — Other Ambulatory Visit: Payer: Self-pay

## 2020-08-29 ENCOUNTER — Encounter (HOSPITAL_COMMUNITY)
Admission: RE | Admit: 2020-08-29 | Discharge: 2020-08-29 | Disposition: A | Discharge status: Home / Self Care | Payer: Medicare Other | Source: Ambulatory Visit | Attending: Thoracic Surgery (Cardiothoracic Vascular Surgery) | Admitting: Thoracic Surgery (Cardiothoracic Vascular Surgery)

## 2020-08-29 ENCOUNTER — Encounter: Payer: Self-pay | Admitting: Oncology

## 2020-08-29 ENCOUNTER — Ambulatory Visit: Payer: Medicare Other | Admitting: Allergy & Immunology

## 2020-08-29 DIAGNOSIS — Z20822 Contact with and (suspected) exposure to covid-19: Secondary | ICD-10-CM | POA: Insufficient documentation

## 2020-08-29 DIAGNOSIS — Z01818 Encounter for other preprocedural examination: Secondary | ICD-10-CM

## 2020-08-29 DIAGNOSIS — C3411 Malignant neoplasm of upper lobe, right bronchus or lung: Secondary | ICD-10-CM | POA: Insufficient documentation

## 2020-08-29 DIAGNOSIS — Z01812 Encounter for preprocedural laboratory examination: Secondary | ICD-10-CM

## 2020-08-29 HISTORY — DX: Personal history of other diseases of the digestive system: Z87.19

## 2020-08-29 HISTORY — DX: Hypothyroidism, unspecified: E03.9

## 2020-08-29 LAB — PROTIME-INR
INR: 1 (ref 0.8–1.2)
Prothrombin Time: 13.1 seconds (ref 11.4–15.2)

## 2020-08-29 LAB — COMPREHENSIVE METABOLIC PANEL
ALT: 27 U/L (ref 0–44)
AST: 32 U/L (ref 15–41)
Albumin: 3.6 g/dL (ref 3.5–5.0)
Alkaline Phosphatase: 108 U/L (ref 38–126)
Anion gap: 11 (ref 5–15)
BUN: 14 mg/dL (ref 8–23)
CO2: 21 mmol/L — ABNORMAL LOW (ref 22–32)
Calcium: 9.2 mg/dL (ref 8.9–10.3)
Chloride: 104 mmol/L (ref 98–111)
Creatinine, Ser: 0.7 mg/dL (ref 0.44–1.00)
GFR, Estimated: >60 mL/min (ref >60)
Glucose, Bld: 125 mg/dL — ABNORMAL HIGH (ref 70–99)
Potassium: 3.8 mmol/L (ref 3.5–5.1)
Sodium: 136 mmol/L (ref 135–145)
Total Bilirubin: 1 mg/dL (ref 0.3–1.2)
Total Protein: 6.5 g/dL (ref 6.5–8.1)

## 2020-08-29 LAB — CBC
HCT: 38.5 % (ref 36.0–46.0)
Hemoglobin: 12.5 g/dL (ref 12.0–15.0)
MCH: 29.1 pg (ref 26.0–34.0)
MCHC: 32.5 g/dL (ref 30.0–36.0)
MCV: 89.7 fL (ref 80.0–100.0)
Platelets: 268 10*3/uL (ref 150–400)
RBC: 4.29 MIL/uL (ref 3.87–5.11)
RDW: 14.5 % (ref 11.5–15.5)
WBC: 7.7 10*3/uL (ref 4.0–10.5)
nRBC: 0 % (ref 0.0–0.2)

## 2020-08-29 LAB — BLOOD GAS, ARTERIAL
Acid-base deficit: 1.8 mmol/L (ref 0.0–2.0)
Bicarbonate: 22.2 mmol/L (ref 20.0–28.0)
Drawn by: 602861
FIO2: 21
O2 Saturation: 95 %
Patient temperature: 37
pCO2 arterial: 36.5 mmHg (ref 32.0–48.0)
pH, Arterial: 7.401 (ref 7.350–7.450)
pO2, Arterial: 90.1 mmHg (ref 83.0–108.0)

## 2020-08-29 LAB — URINALYSIS, ROUTINE W REFLEX MICROSCOPIC
Bacteria, UA: NONE SEEN
Bilirubin Urine: NEGATIVE
Glucose, UA: NEGATIVE mg/dL
Hgb urine dipstick: NEGATIVE
Ketones, ur: NEGATIVE mg/dL
Nitrite: NEGATIVE
Protein, ur: NEGATIVE mg/dL
Specific Gravity, Urine: 1.006 (ref 1.005–1.030)
pH: 6 (ref 5.0–8.0)

## 2020-08-29 LAB — TYPE AND SCREEN
ABO/RH(D): O POS
Antibody Screen: NEGATIVE

## 2020-08-29 LAB — APTT: aPTT: 26 seconds (ref 24–36)

## 2020-08-29 LAB — SURGICAL PCR SCREEN
MRSA, PCR: NEGATIVE
Staphylococcus aureus: NEGATIVE

## 2020-08-29 LAB — SARS CORONAVIRUS 2 (TAT 6-24 HRS): SARS Coronavirus 2: NEGATIVE

## 2020-08-29 NOTE — Progress Notes (Signed)
Notified ryan brooks of U/A result.

## 2020-08-29 NOTE — Progress Notes (Addendum)
PCP - Westley Chandler, FNP Cardiologist - denies Endocrinologist: Dr. Loni Beckwith  PPM/ICD - denies   Chest x-ray - 08/29/20 EKG - 08/29/20 Stress Test - denies ECHO - denies Cardiac Cath - denies  Sleep Study - denies   Pre diabetic- does not check blood sugars at home  As of today, STOP taking any Aspirin (unless otherwise instructed by your surgeon) Aleve, Naproxen, Ibuprofen, Motrin, Advil, Goody's, BC's, all herbal medications, fish oil, and all vitamins.   Stop taking methotrexate (RHEUMATREX) 3 days prior to surgery.   ERAS Protcol -no   COVID TEST- completed in PAT 08/29/20   Anesthesia review: no  Patient denies shortness of breath, fever, cough and chest pain at PAT appointment   All instructions explained to the patient, with a verbal understanding of the material. Patient agrees to go over the instructions while at home for a better understanding. Patient also instructed to self quarantine after being tested for COVID-19. The opportunity to ask questions was provided.

## 2020-09-01 ENCOUNTER — Inpatient Hospital Stay (HOSPITAL_COMMUNITY)
Admission: RE | Admit: 2020-09-01 | Discharge: 2020-09-03 | DRG: 164 | Disposition: A | Discharge status: Home / Self Care | Payer: Medicare Other | Attending: Thoracic Surgery (Cardiothoracic Vascular Surgery) | Admitting: Thoracic Surgery (Cardiothoracic Vascular Surgery)

## 2020-09-01 ENCOUNTER — Inpatient Hospital Stay (HOSPITAL_COMMUNITY): Payer: Medicare Other | Admitting: Certified Registered"

## 2020-09-01 ENCOUNTER — Encounter (HOSPITAL_COMMUNITY)
Admission: RE | Disposition: A | Discharge status: Home / Self Care | Payer: Self-pay | Source: Home / Self Care | Attending: Thoracic Surgery (Cardiothoracic Vascular Surgery)

## 2020-09-01 ENCOUNTER — Inpatient Hospital Stay (HOSPITAL_COMMUNITY): Payer: Medicare Other

## 2020-09-01 ENCOUNTER — Other Ambulatory Visit: Payer: Self-pay

## 2020-09-01 ENCOUNTER — Encounter (HOSPITAL_COMMUNITY): Payer: Self-pay | Admitting: Thoracic Surgery (Cardiothoracic Vascular Surgery)

## 2020-09-01 DIAGNOSIS — Z91040 Latex allergy status: Secondary | ICD-10-CM | POA: Diagnosis not present

## 2020-09-01 DIAGNOSIS — Z87891 Personal history of nicotine dependence: Secondary | ICD-10-CM

## 2020-09-01 DIAGNOSIS — J9382 Other air leak: Secondary | ICD-10-CM | POA: Diagnosis not present

## 2020-09-01 DIAGNOSIS — Z79890 Hormone replacement therapy (postmenopausal): Secondary | ICD-10-CM | POA: Diagnosis not present

## 2020-09-01 DIAGNOSIS — Z885 Allergy status to narcotic agent status: Secondary | ICD-10-CM

## 2020-09-01 DIAGNOSIS — M8580 Other specified disorders of bone density and structure, unspecified site: Secondary | ICD-10-CM | POA: Diagnosis present

## 2020-09-01 DIAGNOSIS — C3411 Malignant neoplasm of upper lobe, right bronchus or lung: Secondary | ICD-10-CM

## 2020-09-01 DIAGNOSIS — Z01812 Encounter for preprocedural laboratory examination: Secondary | ICD-10-CM

## 2020-09-01 DIAGNOSIS — Z9884 Bariatric surgery status: Secondary | ICD-10-CM

## 2020-09-01 DIAGNOSIS — Z888 Allergy status to other drugs, medicaments and biological substances status: Secondary | ICD-10-CM

## 2020-09-01 DIAGNOSIS — Z902 Acquired absence of lung [part of]: Secondary | ICD-10-CM

## 2020-09-01 DIAGNOSIS — D62 Acute posthemorrhagic anemia: Secondary | ICD-10-CM | POA: Diagnosis not present

## 2020-09-01 DIAGNOSIS — Z79899 Other long term (current) drug therapy: Secondary | ICD-10-CM

## 2020-09-01 DIAGNOSIS — Z823 Family history of stroke: Secondary | ICD-10-CM

## 2020-09-01 DIAGNOSIS — E039 Hypothyroidism, unspecified: Secondary | ICD-10-CM | POA: Diagnosis present

## 2020-09-01 DIAGNOSIS — Z85038 Personal history of other malignant neoplasm of large intestine: Secondary | ICD-10-CM

## 2020-09-01 DIAGNOSIS — Z882 Allergy status to sulfonamides status: Secondary | ICD-10-CM | POA: Diagnosis not present

## 2020-09-01 DIAGNOSIS — J939 Pneumothorax, unspecified: Secondary | ICD-10-CM

## 2020-09-01 DIAGNOSIS — Z803 Family history of malignant neoplasm of breast: Secondary | ICD-10-CM

## 2020-09-01 DIAGNOSIS — Z833 Family history of diabetes mellitus: Secondary | ICD-10-CM | POA: Diagnosis not present

## 2020-09-01 DIAGNOSIS — K219 Gastro-esophageal reflux disease without esophagitis: Secondary | ICD-10-CM | POA: Diagnosis present

## 2020-09-01 DIAGNOSIS — Z09 Encounter for follow-up examination after completed treatment for conditions other than malignant neoplasm: Secondary | ICD-10-CM

## 2020-09-01 DIAGNOSIS — Z20822 Contact with and (suspected) exposure to covid-19: Secondary | ICD-10-CM | POA: Diagnosis present

## 2020-09-01 DIAGNOSIS — G43909 Migraine, unspecified, not intractable, without status migrainosus: Secondary | ICD-10-CM | POA: Diagnosis present

## 2020-09-01 HISTORY — PX: INTERCOSTAL NERVE BLOCK: SHX5021

## 2020-09-01 HISTORY — PX: NODE DISSECTION: SHX5269

## 2020-09-01 LAB — GLUCOSE, CAPILLARY: Glucose-Capillary: 128 mg/dL — ABNORMAL HIGH (ref 70–99)

## 2020-09-01 SURGERY — LOBECTOMY, LUNG, ROBOT-ASSISTED, USING VATS
Anesthesia: General | Site: Chest | Laterality: Right

## 2020-09-01 MED ORDER — FOLIC ACID 1 MG PO TABS
1.0000 mg | ORAL_TABLET | Freq: Every day | ORAL | Status: DC
  Administered 2020-09-02 – 2020-09-03 (×2): 1 mg via ORAL
  Filled 2020-09-01 (×2): qty 1

## 2020-09-01 MED ORDER — SUGAMMADEX SODIUM 200 MG/2ML IV SOLN
INTRAVENOUS | Status: DC | PRN
  Administered 2020-09-01: 200 mg via INTRAVENOUS

## 2020-09-01 MED ORDER — ONDANSETRON HCL 4 MG/2ML IJ SOLN
4.0000 mg | Freq: Four times a day (QID) | INTRAMUSCULAR | Status: DC | PRN
Start: 2020-09-01 — End: 2020-09-01

## 2020-09-01 MED ORDER — EPHEDRINE 5 MG/ML INJ
INTRAVENOUS | Status: AC
  Filled 2020-09-01: qty 5

## 2020-09-01 MED ORDER — DEXAMETHASONE SODIUM PHOSPHATE 10 MG/ML IJ SOLN
INTRAMUSCULAR | Status: AC
  Filled 2020-09-01: qty 1

## 2020-09-01 MED ORDER — ONDANSETRON HCL 4 MG/2ML IJ SOLN: 4.0000 mg | Freq: Four times a day (QID) | INTRAMUSCULAR | Status: DC | PRN

## 2020-09-01 MED ORDER — LIDOCAINE 2% (20 MG/ML) 5 ML SYRINGE
INTRAMUSCULAR | Status: AC
  Filled 2020-09-01: qty 5

## 2020-09-01 MED ORDER — MIDAZOLAM HCL 5 MG/5ML IJ SOLN
INTRAMUSCULAR | Status: DC | PRN
  Administered 2020-09-01: 2 mg via INTRAVENOUS

## 2020-09-01 MED ORDER — DEXMEDETOMIDINE (PRECEDEX) IN NS 20 MCG/5ML (4 MCG/ML) IV SYRINGE
PREFILLED_SYRINGE | INTRAVENOUS | Status: DC | PRN
  Administered 2020-09-01: 12 ug via INTRAVENOUS

## 2020-09-01 MED ORDER — DORZOLAMIDE HCL-TIMOLOL MAL 2-0.5 % OP SOLN
1.0000 [drp] | Freq: Two times a day (BID) | OPHTHALMIC | Status: DC
  Administered 2020-09-01 – 2020-09-03 (×4): 1 [drp] via OPHTHALMIC
  Filled 2020-09-01: qty 10

## 2020-09-01 MED ORDER — ONDANSETRON HCL 4 MG/2ML IJ SOLN
INTRAMUSCULAR | Status: AC
  Filled 2020-09-01: qty 2

## 2020-09-01 MED ORDER — HYDROMORPHONE HCL 1 MG/ML IJ SOLN
INTRAMUSCULAR | Status: AC
  Administered 2020-09-01: 0.5 mg via INTRAVENOUS
  Filled 2020-09-01: qty 1

## 2020-09-01 MED ORDER — LACTATED RINGERS IV SOLN: INTRAVENOUS | Status: DC

## 2020-09-01 MED ORDER — OXYCODONE HCL 5 MG PO TABS: 5.0000 mg | ORAL_TABLET | Freq: Once | ORAL | Status: DC | PRN

## 2020-09-01 MED ORDER — KETOROLAC TROMETHAMINE 30 MG/ML IJ SOLN
INTRAMUSCULAR | Status: DC | PRN
  Administered 2020-09-01: 30 mg via INTRAVENOUS

## 2020-09-01 MED ORDER — ROCURONIUM BROMIDE 10 MG/ML (PF) SYRINGE
PREFILLED_SYRINGE | INTRAVENOUS | Status: AC
  Filled 2020-09-01: qty 10

## 2020-09-01 MED ORDER — PROPOFOL 10 MG/ML IV BOLUS
INTRAVENOUS | Status: AC
  Filled 2020-09-01: qty 20

## 2020-09-01 MED ORDER — ONDANSETRON HCL 4 MG/2ML IJ SOLN
INTRAMUSCULAR | Status: DC | PRN
  Administered 2020-09-01: 4 mg via INTRAVENOUS

## 2020-09-01 MED ORDER — TRAMADOL HCL 50 MG PO TABS
50.0000 mg | ORAL_TABLET | Freq: Four times a day (QID) | ORAL | Status: DC | PRN
  Administered 2020-09-02: 100 mg via ORAL
  Administered 2020-09-03: 50 mg via ORAL
  Filled 2020-09-01: qty 2
  Filled 2020-09-01: qty 1

## 2020-09-01 MED ORDER — CYPROHEPTADINE HCL 4 MG PO TABS
4.0000 mg | ORAL_TABLET | Freq: Every day | ORAL | Status: DC
  Administered 2020-09-01 – 2020-09-02 (×2): 4 mg via ORAL
  Filled 2020-09-01 (×4): qty 1

## 2020-09-01 MED ORDER — SENNOSIDES-DOCUSATE SODIUM 8.6-50 MG PO TABS
1.0000 | ORAL_TABLET | Freq: Every day | ORAL | Status: DC
  Filled 2020-09-01 (×2): qty 1

## 2020-09-01 MED ORDER — ORAL CARE MOUTH RINSE: 15.0000 mL | Freq: Once | OROMUCOSAL | Status: AC

## 2020-09-01 MED ORDER — ROCURONIUM BROMIDE 10 MG/ML (PF) SYRINGE
PREFILLED_SYRINGE | INTRAVENOUS | Status: DC | PRN
  Administered 2020-09-01 (×4): 20 mg via INTRAVENOUS
  Administered 2020-09-01: 60 mg via INTRAVENOUS

## 2020-09-01 MED ORDER — 0.9 % SODIUM CHLORIDE (POUR BTL) OPTIME
TOPICAL | Status: DC | PRN
  Administered 2020-09-01: 2000 mL

## 2020-09-01 MED ORDER — AZELASTINE HCL 0.1 % NA SOLN
2.0000 | Freq: Two times a day (BID) | NASAL | Status: DC
  Filled 2020-09-01: qty 30

## 2020-09-01 MED ORDER — FENTANYL CITRATE (PF) 250 MCG/5ML IJ SOLN
INTRAMUSCULAR | Status: AC
  Filled 2020-09-01: qty 5

## 2020-09-01 MED ORDER — KETOROLAC TROMETHAMINE 30 MG/ML IJ SOLN
15.0000 mg | Freq: Four times a day (QID) | INTRAMUSCULAR | Status: AC
  Administered 2020-09-02 – 2020-09-03 (×7): 15 mg via INTRAVENOUS
  Filled 2020-09-01 (×7): qty 1

## 2020-09-01 MED ORDER — BISACODYL 5 MG PO TBEC
10.0000 mg | DELAYED_RELEASE_TABLET | Freq: Every day | ORAL | Status: DC
  Filled 2020-09-01 (×2): qty 2

## 2020-09-01 MED ORDER — FENTANYL CITRATE (PF) 100 MCG/2ML IJ SOLN
INTRAMUSCULAR | Status: DC | PRN
  Administered 2020-09-01 (×5): 50 ug via INTRAVENOUS

## 2020-09-01 MED ORDER — ELETRIPTAN HYDROBROMIDE 20 MG PO TABS
40.0000 mg | ORAL_TABLET | ORAL | Status: DC | PRN
  Filled 2020-09-01: qty 2

## 2020-09-01 MED ORDER — MIDAZOLAM HCL 2 MG/2ML IJ SOLN
INTRAMUSCULAR | Status: AC
  Filled 2020-09-01: qty 2

## 2020-09-01 MED ORDER — SODIUM CHLORIDE 0.9 % IV SOLN: INTRAVENOUS | Status: DC | PRN

## 2020-09-01 MED ORDER — EPHEDRINE SULFATE 50 MG/ML IJ SOLN
INTRAMUSCULAR | Status: DC | PRN
  Administered 2020-09-01 (×2): 10 mg via INTRAVENOUS

## 2020-09-01 MED ORDER — OXYCODONE HCL 5 MG/5ML PO SOLN
5.0000 mg | Freq: Once | ORAL | Status: DC | PRN
Start: 2020-09-01 — End: 2020-09-01

## 2020-09-01 MED ORDER — SODIUM CHLORIDE FLUSH 0.9 % IV SOLN: INTRAVENOUS | Status: DC | PRN

## 2020-09-01 MED ORDER — CHLORHEXIDINE GLUCONATE 0.12 % MT SOLN
15.0000 mL | Freq: Once | OROMUCOSAL | Status: AC
  Administered 2020-09-01: 15 mL via OROMUCOSAL
  Filled 2020-09-01: qty 15

## 2020-09-01 MED ORDER — PROPOFOL 1000 MG/100ML IV EMUL
INTRAVENOUS | Status: AC
  Filled 2020-09-01: qty 100

## 2020-09-01 MED ORDER — DARIFENACIN HYDROBROMIDE ER 7.5 MG PO TB24
7.5000 mg | ORAL_TABLET | Freq: Every day | ORAL | Status: DC
  Administered 2020-09-01 – 2020-09-03 (×3): 7.5 mg via ORAL
  Filled 2020-09-01 (×3): qty 1

## 2020-09-01 MED ORDER — HYDROMORPHONE HCL 1 MG/ML IJ SOLN: 0.2500 mg | INTRAMUSCULAR | Status: AC

## 2020-09-01 MED ORDER — BUPIVACAINE HCL (PF) 0.5 % IJ SOLN
INTRAMUSCULAR | Status: AC
  Filled 2020-09-01: qty 30

## 2020-09-01 MED ORDER — PREDNISOLONE ACETATE 1 % OP SUSP
1.0000 [drp] | Freq: Every day | OPHTHALMIC | Status: DC
  Administered 2020-09-02 – 2020-09-03 (×2): 1 [drp] via OPHTHALMIC
  Filled 2020-09-01: qty 5

## 2020-09-01 MED ORDER — PANTOPRAZOLE SODIUM 40 MG PO TBEC
40.0000 mg | DELAYED_RELEASE_TABLET | Freq: Every day | ORAL | Status: DC
  Administered 2020-09-02 – 2020-09-03 (×2): 40 mg via ORAL
  Filled 2020-09-01 (×2): qty 1

## 2020-09-01 MED ORDER — DEXAMETHASONE SODIUM PHOSPHATE 10 MG/ML IJ SOLN
INTRAMUSCULAR | Status: DC | PRN
  Administered 2020-09-01: 10 mg via INTRAVENOUS

## 2020-09-01 MED ORDER — PROPOFOL 10 MG/ML IV BOLUS
INTRAVENOUS | Status: DC | PRN
  Administered 2020-09-01 (×2): 100 mg via INTRAVENOUS

## 2020-09-01 MED ORDER — KETOROLAC TROMETHAMINE 30 MG/ML IJ SOLN
30.0000 mg | Freq: Four times a day (QID) | INTRAMUSCULAR | Status: DC
  Administered 2020-09-01: 30 mg via INTRAVENOUS
  Filled 2020-09-01: qty 1

## 2020-09-01 MED ORDER — SODIUM CHLORIDE 0.9 % IV SOLN
INTRAVENOUS | Status: DC | PRN
  Administered 2020-09-01: 30 ug/min via INTRAVENOUS

## 2020-09-01 MED ORDER — BUPIVACAINE LIPOSOME 1.3 % IJ SUSP
INTRAMUSCULAR | Status: AC
  Filled 2020-09-01: qty 20

## 2020-09-01 MED ORDER — KETOROLAC TROMETHAMINE 30 MG/ML IJ SOLN
INTRAMUSCULAR | Status: AC
  Filled 2020-09-01: qty 1

## 2020-09-01 MED ORDER — CEFAZOLIN SODIUM-DEXTROSE 2-4 GM/100ML-% IV SOLN
2.0000 g | Freq: Three times a day (TID) | INTRAVENOUS | Status: AC
  Administered 2020-09-01 – 2020-09-02 (×2): 2 g via INTRAVENOUS
  Filled 2020-09-01 (×2): qty 100

## 2020-09-01 MED ORDER — CEFAZOLIN SODIUM-DEXTROSE 2-4 GM/100ML-% IV SOLN
2.0000 g | INTRAVENOUS | Status: AC
  Administered 2020-09-01: 2 g via INTRAVENOUS
  Filled 2020-09-01: qty 100

## 2020-09-01 MED ORDER — ACETAMINOPHEN 160 MG/5ML PO SOLN
1000.0000 mg | Freq: Four times a day (QID) | ORAL | Status: DC
  Filled 2020-09-01: qty 40.6

## 2020-09-01 MED ORDER — GABAPENTIN 400 MG PO CAPS
400.0000 mg | ORAL_CAPSULE | Freq: Two times a day (BID) | ORAL | Status: DC
  Administered 2020-09-01 – 2020-09-03 (×4): 400 mg via ORAL
  Filled 2020-09-01 (×4): qty 1

## 2020-09-01 MED ORDER — PHENYLEPHRINE 40 MCG/ML (10ML) SYRINGE FOR IV PUSH (FOR BLOOD PRESSURE SUPPORT)
PREFILLED_SYRINGE | INTRAVENOUS | Status: AC
  Filled 2020-09-01: qty 10

## 2020-09-01 MED ORDER — TIZANIDINE HCL 4 MG PO TABS: 4.0000 mg | ORAL_TABLET | Freq: Three times a day (TID) | ORAL | Status: DC | PRN

## 2020-09-01 MED ORDER — LEVOTHYROXINE SODIUM 75 MCG PO TABS
75.0000 ug | ORAL_TABLET | Freq: Every day | ORAL | Status: DC
  Administered 2020-09-02 – 2020-09-03 (×2): 75 ug via ORAL
  Filled 2020-09-01 (×2): qty 1

## 2020-09-01 MED ORDER — ADULT MULTIVITAMIN W/MINERALS CH
1.0000 | ORAL_TABLET | Freq: Two times a day (BID) | ORAL | Status: DC
  Administered 2020-09-01 – 2020-09-03 (×4): 1 via ORAL
  Filled 2020-09-01 (×4): qty 1

## 2020-09-01 MED ORDER — FENTANYL CITRATE (PF) 100 MCG/2ML IJ SOLN
25.0000 ug | INTRAMUSCULAR | Status: DC | PRN
  Administered 2020-09-01 (×2): 25 ug via INTRAVENOUS

## 2020-09-01 MED ORDER — ACETAMINOPHEN 500 MG PO TABS
1000.0000 mg | ORAL_TABLET | Freq: Four times a day (QID) | ORAL | Status: DC
Start: 2020-09-01 — End: 2020-09-03
  Administered 2020-09-01 – 2020-09-03 (×7): 1000 mg via ORAL
  Filled 2020-09-01 (×7): qty 2

## 2020-09-01 MED ORDER — FENTANYL CITRATE (PF) 100 MCG/2ML IJ SOLN
INTRAMUSCULAR | Status: AC
  Administered 2020-09-01: 50 ug via INTRAVENOUS
  Filled 2020-09-01: qty 2

## 2020-09-01 SURGICAL SUPPLY — 120 items
BLADE CLIPPER SURG (BLADE) IMPLANT
BLADE SURG 11 STRL SS (BLADE) ×2 IMPLANT
BNDG COHESIVE 6X5 TAN STRL LF (GAUZE/BANDAGES/DRESSINGS) IMPLANT
CANISTER SUCT 3000ML PPV (MISCELLANEOUS) ×4 IMPLANT
CANNULA REDUC XI 12-8 STAPL (CANNULA) ×2
CANNULA REDUCER 12-8 DVNC XI (CANNULA) ×2 IMPLANT
CATH THORACIC 28FR (CATHETERS) ×2 IMPLANT
CATH THORACIC 28FR RT ANG (CATHETERS) IMPLANT
CATH THORACIC 36FR (CATHETERS) IMPLANT
CATH THORACIC 36FR RT ANG (CATHETERS) IMPLANT
CATH TROCAR 20FR (CATHETERS) IMPLANT
CHLORAPREP W/TINT 26 (MISCELLANEOUS) ×2 IMPLANT
CLIP VESOCCLUDE MED 6/CT (CLIP) IMPLANT
CNTNR URN SCR LID CUP LEK RST (MISCELLANEOUS) ×14 IMPLANT
CONN ST 1/4X3/8  BEN (MISCELLANEOUS)
CONN ST 1/4X3/8 BEN (MISCELLANEOUS) IMPLANT
CONN Y 3/8X3/8X3/8  BEN (MISCELLANEOUS)
CONN Y 3/8X3/8X3/8 BEN (MISCELLANEOUS) IMPLANT
CONT SPEC 4OZ STRL OR WHT (MISCELLANEOUS) ×14
COVER SURGICAL LIGHT HANDLE (MISCELLANEOUS) IMPLANT
DEFOGGER SCOPE WARMER CLEARIFY (MISCELLANEOUS) ×2 IMPLANT
DERMABOND ADVANCED (GAUZE/BANDAGES/DRESSINGS) ×1
DERMABOND ADVANCED .7 DNX12 (GAUZE/BANDAGES/DRESSINGS) ×1 IMPLANT
DISSECTOR BLUNT TIP ENDO 5MM (MISCELLANEOUS) IMPLANT
DRAIN CHANNEL 28F RND 3/8 FF (WOUND CARE) IMPLANT
DRAIN CHANNEL 32F RND 10.7 FF (WOUND CARE) IMPLANT
DRAPE ARM DVNC X/XI (DISPOSABLE) ×4 IMPLANT
DRAPE COLUMN DVNC XI (DISPOSABLE) ×1 IMPLANT
DRAPE CV SPLIT W-CLR ANES SCRN (DRAPES) ×2 IMPLANT
DRAPE DA VINCI XI ARM (DISPOSABLE) ×4
DRAPE DA VINCI XI COLUMN (DISPOSABLE) ×1
DRAPE HALF SHEET 40X57 (DRAPES) ×2 IMPLANT
DRAPE ORTHO SPLIT 77X108 STRL (DRAPES) ×1
DRAPE SURG ORHT 6 SPLT 77X108 (DRAPES) ×1 IMPLANT
ELECT BLADE 6.5 EXT (BLADE) IMPLANT
ELECT REM PT RETURN 9FT ADLT (ELECTROSURGICAL) ×2
ELECTRODE REM PT RTRN 9FT ADLT (ELECTROSURGICAL) ×1 IMPLANT
GAUZE KITTNER 4X10 (MISCELLANEOUS) ×2 IMPLANT
GAUZE KITTNER 4X5 RF (MISCELLANEOUS) ×2 IMPLANT
GAUZE KITTNER 4X8 (MISCELLANEOUS) ×2 IMPLANT
GAUZE SPONGE 4X4 12PLY STRL (GAUZE/BANDAGES/DRESSINGS) ×2 IMPLANT
GLOVE SURG ENC MOIS LTX SZ7.5 (GLOVE) ×4 IMPLANT
GOWN STRL REUS W/ TWL LRG LVL3 (GOWN DISPOSABLE) ×2 IMPLANT
GOWN STRL REUS W/ TWL XL LVL3 (GOWN DISPOSABLE) ×3 IMPLANT
GOWN STRL REUS W/TWL 2XL LVL3 (GOWN DISPOSABLE) ×2 IMPLANT
GOWN STRL REUS W/TWL LRG LVL3 (GOWN DISPOSABLE) ×2
GOWN STRL REUS W/TWL XL LVL3 (GOWN DISPOSABLE) ×3
HEMOSTAT SURGICEL 2X14 (HEMOSTASIS) ×2 IMPLANT
IRRIGATION STRYKERFLOW (MISCELLANEOUS) IMPLANT
IRRIGATOR STRYKERFLOW (MISCELLANEOUS)
KIT BASIN OR (CUSTOM PROCEDURE TRAY) ×2 IMPLANT
KIT SUCTION CATH 14FR (SUCTIONS) IMPLANT
KIT TURNOVER KIT B (KITS) ×2 IMPLANT
LOOP VESSEL SUPERMAXI WHITE (MISCELLANEOUS) IMPLANT
NEEDLE 22X1 1/2 (OR ONLY) (NEEDLE) ×2 IMPLANT
NS IRRIG 1000ML POUR BTL (IV SOLUTION) ×6 IMPLANT
PACK CHEST (CUSTOM PROCEDURE TRAY) ×2 IMPLANT
PAD ARMBOARD 7.5X6 YLW CONV (MISCELLANEOUS) ×10 IMPLANT
PORT ACCESS TROCAR AIRSEAL 12 (TROCAR) ×1 IMPLANT
PORT ACCESS TROCAR AIRSEAL 5M (TROCAR) ×1
POUCH ENDO CATCH II 15MM (MISCELLANEOUS) IMPLANT
POUCH SPECIMEN RETRIEVAL 10MM (ENDOMECHANICALS) IMPLANT
RELOAD STAPLE TA45 3.5 REG BLU (ENDOMECHANICALS) ×2 IMPLANT
RELOAD STAPLER 2.5X45 WHT DVNC (STAPLE) ×4 IMPLANT
RELOAD STAPLER 3.5X45 BLU DVNC (STAPLE) ×7 IMPLANT
RELOAD STAPLER 4.3X45 GRN DVNC (STAPLE) ×2 IMPLANT
RETRACTOR WOUND ALXS 19CM XSML (INSTRUMENTS) IMPLANT
RTRCTR WOUND ALEXIS 19CM XSML (INSTRUMENTS)
SCISSORS LAP 5X35 DISP (ENDOMECHANICALS) IMPLANT
SEAL CANN UNIV 5-8 DVNC XI (MISCELLANEOUS) ×2 IMPLANT
SEAL XI 5MM-8MM UNIVERSAL (MISCELLANEOUS) ×2
SEALANT PROGEL (MISCELLANEOUS) IMPLANT
SEALANT SURG COSEAL 4ML (VASCULAR PRODUCTS) IMPLANT
SEALANT SURG COSEAL 8ML (VASCULAR PRODUCTS) IMPLANT
SEALER LIGASURE MARYLAND 30 (ELECTROSURGICAL) IMPLANT
SET TRI-LUMEN FLTR TB AIRSEAL (TUBING) ×2 IMPLANT
SOLUTION ELECTROLUBE (MISCELLANEOUS) ×2 IMPLANT
SPONGE INTESTINAL PEANUT (DISPOSABLE) IMPLANT
SPONGE TONSIL TAPE 1 RFD (DISPOSABLE) IMPLANT
STAPLER 45 SUREFORM CVD (STAPLE) ×1
STAPLER 45 SUREFORM CVD DVNC (STAPLE) ×1 IMPLANT
STAPLER CANNULA SEAL DVNC XI (STAPLE) ×2 IMPLANT
STAPLER CANNULA SEAL XI (STAPLE) ×2
STAPLER ENDO NO KNIFE (STAPLE) ×2 IMPLANT
STAPLER RELOAD 2.5X45 WHITE (STAPLE) ×4
STAPLER RELOAD 2.5X45 WHT DVNC (STAPLE) ×4
STAPLER RELOAD 3.5X45 BLU DVNC (STAPLE) ×7
STAPLER RELOAD 3.5X45 BLUE (STAPLE) ×7
STAPLER RELOAD 4.3X45 GREEN (STAPLE) ×2
STAPLER RELOAD 4.3X45 GRN DVNC (STAPLE) ×2
STOPCOCK 4 WAY LG BORE MALE ST (IV SETS) ×2 IMPLANT
SUT MNCRL AB 3-0 PS2 18 (SUTURE) IMPLANT
SUT MON AB 2-0 CT1 36 (SUTURE) IMPLANT
SUT PDS AB 1 CTX 36 (SUTURE) IMPLANT
SUT PROLENE 4 0 RB 1 (SUTURE)
SUT PROLENE 4-0 RB1 .5 CRCL 36 (SUTURE) IMPLANT
SUT SILK  1 MH (SUTURE) ×1
SUT SILK 1 MH (SUTURE) ×1 IMPLANT
SUT SILK 1 TIES 10X30 (SUTURE) IMPLANT
SUT SILK 2 0 SH (SUTURE) IMPLANT
SUT SILK 2 0SH CR/8 30 (SUTURE) IMPLANT
SUT VIC AB 1 CTX 36 (SUTURE)
SUT VIC AB 1 CTX36XBRD ANBCTR (SUTURE) IMPLANT
SUT VIC AB 2-0 CT1 27 (SUTURE) ×1
SUT VIC AB 2-0 CT1 TAPERPNT 27 (SUTURE) ×1 IMPLANT
SUT VIC AB 3-0 SH 27 (SUTURE) ×2
SUT VIC AB 3-0 SH 27X BRD (SUTURE) ×2 IMPLANT
SUT VICRYL 0 TIES 12 18 (SUTURE) ×2 IMPLANT
SUT VICRYL 0 UR6 27IN ABS (SUTURE) ×4 IMPLANT
SUT VICRYL 2 TP 1 (SUTURE) IMPLANT
SYR 10ML LL (SYRINGE) ×2 IMPLANT
SYR 20ML LL LF (SYRINGE) ×2 IMPLANT
SYR 50ML LL SCALE MARK (SYRINGE) ×2 IMPLANT
SYSTEM RETRIEVAL ANCHOR 15 (MISCELLANEOUS) ×2 IMPLANT
SYSTEM SAHARA CHEST DRAIN ATS (WOUND CARE) ×2 IMPLANT
TAPE CLOTH 4X10 WHT NS (GAUZE/BANDAGES/DRESSINGS) ×2 IMPLANT
TIP APPLICATOR SPRAY EXTEND 16 (VASCULAR PRODUCTS) IMPLANT
TOWEL GREEN STERILE (TOWEL DISPOSABLE) ×2 IMPLANT
TRAY FOLEY MTR SLVR 16FR STAT (SET/KITS/TRAYS/PACK) ×2 IMPLANT
TUBING EXTENTION W/L.L. (IV SETS) ×2 IMPLANT

## 2020-09-01 NOTE — Anesthesia Procedure Notes (Signed)
Central Venous Catheter Insertion Performed by: Albertha Ghee, MD, anesthesiologist Start/End7/18/2022 7:03 AM, 09/01/2020 7:14 AM Patient location: Pre-op. Preanesthetic checklist: patient identified, IV checked, site marked, risks and benefits discussed, surgical consent, monitors and equipment checked, pre-op evaluation, timeout performed and anesthesia consent Position: Trendelenburg Lidocaine 1% used for infiltration and patient sedated Hand hygiene performed , maximum sterile barriers used  and Seldinger technique used Catheter size: 7 Fr Central line was placed.Double lumen Procedure performed using ultrasound guided technique. Ultrasound Notes:anatomy identified, needle tip was noted to be adjacent to the nerve/plexus identified, no ultrasound evidence of intravascular and/or intraneural injection and image(s) printed for medical record Attempts: 1 Following insertion, line sutured, dressing applied and Biopatch. Post procedure assessment: blood return through all ports, free fluid flow and no air  Patient tolerated the procedure well with no immediate complications.

## 2020-09-01 NOTE — OR Nursing (Signed)
Patient had metal trocars in her ribcage on her right side with no signs at the end of the procedure of having any skin defects such as a rash. A new gown with snaps will be placed on the patient when she leaves the OR. PACU RN will be notified to assess skin. Will continue to monitor.

## 2020-09-01 NOTE — Brief Op Note (Signed)
09/01/2020  11:13 AM  PATIENT:  Jordan Mahoney  70 y.o. female  PRE-OPERATIVE DIAGNOSIS:  Lung Cancer  POST-OPERATIVE DIAGNOSIS:  Lung Cancer  PROCEDURE:  Procedure(s): XI ROBOTIC ASSISTED THORASCOPY- RIGHT UPPER LOBECTOMY (Right) INTERCOSTAL NERVE BLOCK (Right) NODE DISSECTION (Right)  SURGEON:  Surgeon(s) and Role:    * Lightfoot, Lucile Crater, MD - Primary  PHYSICIAN ASSISTANT: Zale Marcotte PA-C  ASSISTANTS: none   ANESTHESIA:   general  EBL:  50 CC   BLOOD ADMINISTERED:NONE  DRAINS: (1 ) Blake drain(s) in the RIGHT HEMITHORAX    LOCAL MEDICATIONS USED:  OTHER EXPAREL  SPECIMEN:  Source of Specimen:  RIGHT UPPER LOBE AND MULTIPLE LN'S  DISPOSITION OF SPECIMEN:  PATHOLOGY  COUNTS:  YES  TOURNIQUET:  * No tourniquets in log *  DICTATION: .Dragon Dictation  PLAN OF CARE: Admit to inpatient   PATIENT DISPOSITION:  PACU - hemodynamically stable.   Delay start of Pharmacological VTE agent (>24hrs) due to surgical blood loss or risk of bleeding: NO  COMPLICATIONS: NO KNOWN

## 2020-09-01 NOTE — Anesthesia Procedure Notes (Signed)
Procedure Name: Intubation Date/Time: 09/01/2020 8:14 AM Performed by: Gaylene Brooks, CRNA Pre-anesthesia Checklist: Patient identified, Emergency Drugs available, Suction available and Patient being monitored Patient Re-evaluated:Patient Re-evaluated prior to induction Oxygen Delivery Method: Circle System Utilized Preoxygenation: Pre-oxygenation with 100% oxygen Induction Type: IV induction Ventilation: Mask ventilation without difficulty Laryngoscope Size: Mac and 3 Grade View: Grade I Tube type: Oral Endobronchial tube: Double lumen EBT and EBT position confirmed by fiberoptic bronchoscope and 37 Fr Number of attempts: 1 Airway Equipment and Method: Stylet Placement Confirmation: ETT inserted through vocal cords under direct vision, positive ETCO2 and breath sounds checked- equal and bilateral Secured at: 29 cm Tube secured with: Tape Dental Injury: Teeth and Oropharynx as per pre-operative assessment

## 2020-09-01 NOTE — Anesthesia Postprocedure Evaluation (Signed)
Anesthesia Post Note  Patient: Lanell Carpenter Breeden  Procedure(s) Performed: XI ROBOTIC ASSISTED THORASCOPY- RIGHT UPPER LOBECTOMY (Right: Chest) INTERCOSTAL NERVE BLOCK (Right: Chest) NODE DISSECTION (Right: Chest)     Patient location during evaluation: PACU Anesthesia Type: General Level of consciousness: awake and alert Pain management: pain level controlled Vital Signs Assessment: post-procedure vital signs reviewed and stable Respiratory status: spontaneous breathing, nonlabored ventilation, respiratory function stable and patient connected to nasal cannula oxygen Cardiovascular status: blood pressure returned to baseline and stable Postop Assessment: no apparent nausea or vomiting Anesthetic complications: no   No notable events documented.  Last Vitals:  Vitals:   09/01/20 1456 09/01/20 1511  BP: (!) 123/59 118/63  Pulse: 61   Resp: (!) 9   Temp:    SpO2: 100%     Last Pain:  Vitals:   09/01/20 1511  TempSrc:   PainSc: Poth

## 2020-09-01 NOTE — Op Note (Signed)
[address]       [place] [address]             [phone]        09/01/2020  Patient:  Jordan Mahoney Pre-Op Dx: Right upper lobe NSCLC   Post-op Dx:  same Procedure: - Robotic assisted right video thoracoscopy - Right upper lobectomy - Mediastinal lymph node sampling - Intercostal nerve block  Surgeon and Role:      * Edra Riccardi, Lucile Crater, MD - Primary    Evonnie Pat, PA-C - assisting  Anesthesia  general EBL:  111ml Blood Administration: none Specimen:  right upper lobe.  Hilar nodes, level 4, and 9 lymph nodes  Drains: 65 F argyle chest tube in right chest Counts: correct   Indications: This is a 70 year old female who presents with an 8 mm right upper lobe pulmonary nodule.  She does have a history of colon cancer which was only treated surgically.  She did not receive any systemic chemotherapy or radiation given the lack of lymph node involvement.  On review of her cross-sectional imaging the nodule is central but it does appear right above the fissure.  It is unclear as to whether or not this nodule is potentially a met from her colon cancer or a primary lung cancer.  She underwent a navigation bronchoscopy, and the results were consistent with NSCLC.  She was agreeable to proceed with a robotic lobectomy.    Findings: Normal anatomy.  Operative Technique: After the risks, benefits and alternatives were thoroughly discussed, the patient was brought to the operative theatre.  Anesthesia was induced, and the patient was then placed in a left lateral lateral decubitus position and was prepped and draped in normal sterile fashion.  An appropriate surgical pause was performed, and pre-operative antibiotics were dosed accordingly.  We began by placing our 4 robotic ports in the the 7th intercostal space targeting the hilum of the lung.  A 32mm assistant port was placed in the 9th intercostal space in the anterior axillary line.  The robot was then  docked and all instruments were passed under direct visualization.    The lung was then retracted superiorly, and the inferior pulmonary ligament was divided.  The hilum was mobilized anteriorly and posteriorly.  We identified the upper lobe vein, and after careful isolation, it was divided with a vascular stapler.  We next moved to the  pulmonary arterial branches.  The artery was then divided with a vascular load stapler.  The bronchus to the upper lobe was then isolated.  After a test clamp, with good ventilation of the middle and lower lobes, the bronchus was then divided.  The fissure was completed, and the specimen was passed into an endocatch bag.  It was removed from the anterior access site.    Lymph nodes were then sampled at levels 4, 9, and the hilum.  The chest was irrigated, and an air leak test was performed.  An intercostal nerve block was performed under direct visualization.  A 57F chest with then placed, and we watch the remaining lobes re-expand.  The skin and soft tissue were closed with absorbable suture    The patient tolerated the procedure without any immediate complications, and was transferred to the PACU in stable condition.  Jordan Mahoney

## 2020-09-01 NOTE — Anesthesia Procedure Notes (Signed)
Arterial Line Insertion Start/End7/18/2022 7:05 AM, 09/01/2020 7:15 AM Performed by: CRNA  Patient location: Pre-op. Preanesthetic checklist: patient identified, risks and benefits discussed, monitors and equipment checked, pre-op evaluation and timeout performed Lidocaine 1% used for infiltration and patient sedated Right, radial was placed Catheter size: 22 G Hand hygiene performed  and maximum sterile barriers used   Attempts: 3 Procedure performed without using ultrasound guided technique. Ultrasound Notes:anatomy identified, needle tip was noted to be adjacent to the nerve/plexus identified and no ultrasound evidence of intravascular and/or intraneural injection Following insertion, dressing applied and Biopatch. Patient tolerated the procedure well with no immediate complications.

## 2020-09-01 NOTE — Progress Notes (Signed)
Patient had a chest x ray on 08/29/20 that has not been read as of today. [facility] Radiology reading room to notify and representative stated they will have a radiologist look at the scan ASAP.

## 2020-09-01 NOTE — Anesthesia Preprocedure Evaluation (Signed)
Anesthesia Evaluation  Patient identified by MRN, date of birth, ID band Patient awake    Reviewed: Allergy & Precautions, H&P , NPO status , Patient's Chart, lab work & pertinent test results  Airway Mallampati: II   Neck ROM: full    Dental   Pulmonary former smoker,    breath sounds clear to auscultation       Cardiovascular negative cardio ROS   Rhythm:regular Rate:Normal     Neuro/Psych  Headaches, PSYCHIATRIC DISORDERS Depression    GI/Hepatic GERD  ,  Endo/Other  Hypothyroidism   Renal/GU      Musculoskeletal  (+) Arthritis ,   Abdominal   Peds  Hematology   Anesthesia Other Findings   Reproductive/Obstetrics                             Anesthesia Physical Anesthesia Plan  ASA: 3  Anesthesia Plan: General   Post-op Pain Management:    Induction: Intravenous  PONV Risk Score and Plan: 3 and Ondansetron, Dexamethasone, Midazolam and Treatment may vary due to age or medical condition  Airway Management Planned: Double Lumen EBT  Additional Equipment: Arterial line  Intra-op Plan:   Post-operative Plan: Extubation in OR  Informed Consent: I have reviewed the patients History and Physical, chart, labs and discussed the procedure including the risks, benefits and alternatives for the proposed anesthesia with the patient or authorized representative who has indicated his/her understanding and acceptance.     Dental advisory given  Plan Discussed with: CRNA, Anesthesiologist and Surgeon  Anesthesia Plan Comments:         Anesthesia Quick Evaluation

## 2020-09-01 NOTE — Plan of Care (Signed)

## 2020-09-01 NOTE — H&P (Signed)
[address]       [place] [address]             [phone]                                                   Aubrea Bowker [facility] Medical Record #[id number] Date of Birth: 1950/11/09   Referring: Garner Nash, DO Primary Care: Ludwig Clarks, FNP Primary Cardiologist: None   Chief Complaint:        Chief Complaint  Patient presents with   Lung Lesion      Surgical consult, Chest CT 06/13/20, PET Scan 06/27/20, PFT's 07/11/20    70 yo female with biopsy proven right upper lobe NSCLC.  No changes since her previous clinic appointment  Vitals:   09/01/20 0557  BP: 140/68  Pulse: (!) 57  Resp: 18  Temp: 98 F (36.7 C)  SpO2: 100%   Alert NAD Sinus brady EWOB   OR today for right robotic assisted right upper lobectomy.  Risks and benefits have been discusseed.    Lajuana Matte    History of Present Illness:    Shasta Chinn 70 y.o. female referred by Dr. Valeta Harms for surgical evaluation of a right upper lobe pulmonary nodule.  She does have a history of stage II colon cancer that was treated surgically and on her surveillance imaging she was noted to have an increase in size and this right upper lobe nodule.  She denies any shortness of breath.  She does have chronic migraines but this has been longstanding.  She has gained some weight which she attributes to her Hashimoto's disease.     Smoking Hx: Quit smoking in the early 1980s Has not had much secondhand smoke exposure.     Zubrod Score: At the time of surgery this patient's most appropriate activity status/level should be described as: _0     0    Normal activity, no symptoms _1     1    Restricted in physical strenuous activity but ambulatory, able to do out light work _2     2    Ambulatory and capable of self care, unable to do work activities, up and about               >50 % of waking hours                              _3     3    Only limited self care, in bed greater than  50% of waking hours _4     4    Completely disabled, no self care, confined to bed or chair _5     5    Moribund         Past Medical History:  Diagnosis Date   Allergic rhinoconjunctivitis     Allergies     Back pain     Chronic uveitis     Frequent UTI     GERD (gastroesophageal reflux disease)     HA (headache)     Hearing loss      wearing bilateral aides   Lactose intolerance     Migraine     Osteopenia     Pre-diabetes     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[place])     Sensorineural hearing loss     Tingling of both feet     Tinnitus             Past Surgical History:  Procedure Laterality Date   ABDOMINAL HYSTERECTOMY       belly button       c sections       CATARACT EXTRACTION Right     CHOLECYSTECTOMY       COLON RESECTION N/A 04/17/2018    Procedure: LAPAROSCOPIC COLON RESECTION POSSIBLE OSTOMY;  Surgeon: Benjamine Sprague, DO;  Location: [facility] ORS;  Service: General;  Laterality: N/A;   ELBOW SURGERY Left     GANGLION CYST EXCISION Left 01/22/2016    base of thumb   GASTRIC BYPASS       GLAUCOMA SURGERY       GLAUCOMA SURGERY   12/13/2019    right eye   HERNIA REPAIR       neck fusion   07/04/2017    Dr. Carloyn Manner in [place] Right             Family History  Problem Relation Age of Onset   Diabetes Father     Stroke Father     Squamous cell carcinoma Mother     Migraines Daughter     Breast cancer Paternal Aunt     Allergic rhinitis Neg Hx     Angioedema Neg Hx     Asthma Neg Hx     Eczema Neg Hx     Immunodeficiency Neg Hx     Urticaria Neg Hx          Social History        Tobacco Use  Smoking Status Former Smoker   Packs/day: 0.50   Years: 10.00   Pack years: 5.00   Types: Cigarettes   Quit date: 1982   Years since quitting: 40.4  Smokeless Tobacco Never Used    Social History        Substance and Sexual Activity  Alcohol Use Yes   Alcohol/week: 1.0 standard drink   Types: 1 Cans of beer per week    Comment: Rare               Allergies  Allergen Reactions   Latex        Rash/hives   Sulfa Antibiotics Other (See Comments)      Causes skin reaction ( on her mid back)  Pigmentation.   Tape     Valium [Diazepam]        Violent dreams            Current Outpatient Medications  Medication Sig Dispense Refill   azelastine (ASTELIN) 0.1 % nasal spray Place 2 sprays into both nostrils daily. 90 mL 3   brimonidine (ALPHAGAN) 0.2 % ophthalmic solution Place into the right eye 3 (three) times daily.       CALCIUM CITRATE PO Take 500 mg by mouth 2 (two) times daily. Bariatric       dorzolamide-timolol (COSOPT) 22.3-6.8 MG/ML ophthalmic solution Place 1 drop into the right eye 2 (two) times daily.       eletriptan (RELPAX) 40 MG tablet Take 1 tablet at the onset of migraine. May repeat in 2 hours if headache persists or recurs. Not to exceed 2 tabs/24hours. 9 tablet 12   Ergocalciferol (VITAMIN D2) 2000 UNITS TABS Take 2,000 Units by mouth daily.  folic acid (FOLVITE) 1 MG tablet Take 1 mg by mouth daily.       Fremanezumab-vfrm (AJOVY) 225 MG/1.5ML SOAJ Inject 225 mg into the skin every 30 (thirty) days. 1.5 mL 11   gabapentin (NEURONTIN) 400 MG capsule Take 1 capsule (400 mg total) by mouth 2 (two) times daily. 180 capsule 3   levothyroxine (SYNTHROID) 75 MCG tablet Take 1 tablet (75 mcg total) by mouth daily before breakfast. 90 tablet 1   Magnesium Oxide 400 MG CAPS Take 400 mg by mouth 2 (two) times daily.       methotrexate (RHEUMATREX) 2.5 MG tablet As directed by doctor.       Multiple Vitamin (MULTIVITAMIN) tablet Take 1 tablet by mouth 2 (two) times daily. Bariatric       Omega-3 Krill Oil 500 MG CAPS Take 1 capsule by mouth daily.       omeprazole (PRILOSEC) 20 MG capsule Take 1 capsule (20 mg total) by mouth in the morning and at bedtime. 60 capsule 5   solifenacin (VESICARE) 10 MG tablet Take 10 mg by mouth daily.       tiZANidine (ZANAFLEX) 4 MG capsule Take 4 mg by mouth 3 (three)  times daily as needed for muscle spasms. Take 1 capsule (4 mg total) by mouth Three (3) times a day as needed       vitamin B-12 (CYANOCOBALAMIN) 100 MCG tablet Take 100 mcg by mouth daily.       vitamin C (ASCORBIC ACID) 500 MG tablet Take 500 mg by mouth 2 (two) times daily.        No current facility-administered medications for this visit.      Review of Systems Constitutional: Negative for malaise/fatigue and weight loss. Respiratory: Negative.   Cardiovascular: Negative.   Musculoskeletal: Positive for joint pain and myalgias. Neurological: Positive for headaches.        PHYSICAL EXAMINATION: BP 113/80   Pulse 72   Resp 20   Ht _0  (1.676 m)   Wt 195 lb (88.5 kg)   SpO2 98% Comment: RA  BMI 31.47 kg/m  Physical Exam   Diagnostic Studies & Laboratory data:     Recent Radiology Findings:   Imaging Results  NM PET Image Initial (PI) Skull Base To Thigh   Result Date: 06/30/2020 CLINICAL DATA:  Initial treatment strategy for right upper lobe pulmonary nodule. History of colon cancer with colon resection in 2020. EXAM: NUCLEAR MEDICINE PET SKULL BASE TO THIGH TECHNIQUE: 9.8 mCi F-18 FDG was injected intravenously. Full-ring PET imaging was performed from the skull base to thigh after the radiotracer. CT data was obtained and used for attenuation correction and anatomic localization. Fasting blood glucose: 107 mg/dl COMPARISON:  Multiple exams, including chest CT from 06/13/2020 FINDINGS: Mediastinal blood pool activity: SUV max 2.8 Liver activity: SUV max NA NECK: Asymmetric palatine tonsillar activity with maximum SUV 7.3 on the left and 5.4 on the right, without obvious CT correlate, probably physiologic. Incidental CT findings: none CHEST: The right upper lobe nodule measures 0.8 by 0.6 cm on image 71 series 4, right at the borderline for sensitive PET-CT assessment. Maximum SUV 1.3. Left subpectoral lymph node 0.7 cm in short axis, previously 0.4 cm on 10/24/2018 and  previously 0.4 cm on 04/30/2019, maximum SUV 2.4. Incidental CT findings: Small hiatal hernia with postoperative findings and prior gastric bypass. Gas in the esophagus, possibly from dysmotility. Atherosclerotic calcification of the aortic arch and branch vessels. ABDOMEN/PELVIS: Scattered activity in bowel is  likely physiologic. Incidental CT findings: Hepatic steatosis. Cholecystectomy. Postoperative findings from gastric bypass. Postoperative findings from sigmoid colon reanastomosis. Presumed scarring along the left kidney upper pole. Peripelvic renal cysts. Aortoiliac atherosclerotic vascular disease. SKELETON: No significant abnormal hypermetabolic activity in this region. Incidental CT findings: T12 hemangioma. Lower cervical plate and screw fixator. Grade 1 degenerative anterolisthesis at L4-5. IMPRESSION: 1. The right upper lobe nodule of concern has a maximum SUV of 1.3. This currently measures 0.8 by 0.6 cm (at the lower size limits for sensitive PET-CT assessment), and does appear to have increased in size in density since 04/03/2018. As such, despite the low activity, the lesion is moderately suspicious for low-grade adenocarcinoma. 2. Asymmetric palatine tonsillar activity greater on the left than the right, probably physiologic/incidental given the lack of obvious CT correlate. 3. Other imaging findings of potential clinical significance: Small hiatal hernia. Prior gastric bypass. Prior sigmoid anastomosis. Gas in the esophagus, possibly from dysmotility. Aortic Atherosclerosis (ICD10-I70.0). Hepatic steatosis. Presumed scarring in the left kidney upper pole. Electronically Signed   By: Van Clines M.D.   On: 06/30/2020 09:29          I have independently reviewed the above radiology studies  and reviewed the findings with the patient.   Recent Lab Findings: Recent Labs       Lab Results  Component Value Date    WBC 6.2 04/28/2020    HGB 12.3 04/28/2020    HCT 38.1 04/28/2020     PLT 228 04/28/2020    GLUCOSE 119 (H) 04/28/2020    ALT 35 04/28/2020    AST 42 (H) 04/28/2020    NA 136 04/28/2020    K 3.6 04/28/2020    CL 104 04/28/2020    CREATININE 0.74 04/28/2020    BUN 8 04/28/2020    CO2 25 04/28/2020    TSH 0.356 (L) 05/13/2020    HGBA1C 6.1 05/20/2020          PFTs: - FVC: 91% - FEV1: 93% -DLCO: 74%   Problem List: 8 mm right upper lobe pulmonary nodule with SUV uptake of 1.3. History of colon cancer stage IIa treated in March 2020   Assessment / Plan:   This is a 70 year old female who presents with an 8 mm right upper lobe pulmonary nodule.  She does have a history of colon cancer which was only treated surgically.  She did not receive any systemic chemotherapy or radiation given the lack of lymph node involvement.  On review of her cross-sectional imaging the nodule is central but it does appear right above the fissure.  It is unclear as to whether or not this nodule is potentially a met from her colon cancer or a primary lung cancer.  I think in this setting it is important in to distinguish between the 2, given that metastasis would only require a wedge resection.  We have discussed several options, and I recommended that she undergo a navigational bronchoscopy with Dr. Valeta Harms as a separate procedure.  We will await the pathology results prior to proceeding with surgery.  If this proves to be a primary lung cancer then we will go straight to a lobectomy, and if this is metastasis then we will just perform a wedge resection.      I  spent 40 minutes with  the patient face to face in counseling and coordination of care.

## 2020-09-01 NOTE — Transfer of Care (Signed)
Immediate Anesthesia Transfer of Care Note  Patient: Jordan Mahoney  Procedure(s) Performed: XI ROBOTIC ASSISTED THORASCOPY- RIGHT UPPER LOBECTOMY (Right: Chest) INTERCOSTAL NERVE BLOCK (Right: Chest) NODE DISSECTION (Right: Chest)  Patient Location: PACU  Anesthesia Type:General  Level of Consciousness: awake, alert  and oriented  Airway & Oxygen Therapy: Patient Spontanous Breathing and Patient connected to face mask oxygen  Post-op Assessment: Report given to RN, Post -op Vital signs reviewed and stable and Patient moving all extremities X 4  Post vital signs: Reviewed and stable  Last Vitals:  Vitals Value Taken Time  BP 117/78 09/01/20 1141  Temp    Pulse 74 09/01/20 1141  Resp 10 09/01/20 1141  SpO2 96 % 09/01/20 1141  Vitals shown include unvalidated device data.  Last Pain:  Vitals:   09/01/20 0609  TempSrc:   PainSc: 0-No pain      Patients Stated Pain Goal: 2 (15/17/61 6073)  Complications: No notable events documented.

## 2020-09-02 ENCOUNTER — Encounter (HOSPITAL_COMMUNITY): Payer: Self-pay | Admitting: Thoracic Surgery (Cardiothoracic Vascular Surgery)

## 2020-09-02 ENCOUNTER — Inpatient Hospital Stay (HOSPITAL_COMMUNITY): Payer: Medicare Other

## 2020-09-02 LAB — CBC
HCT: 32.6 % — ABNORMAL LOW (ref 36.0–46.0)
Hemoglobin: 10.4 g/dL — ABNORMAL LOW (ref 12.0–15.0)
MCH: 29.2 pg (ref 26.0–34.0)
MCHC: 31.9 g/dL (ref 30.0–36.0)
MCV: 91.6 fL (ref 80.0–100.0)
Platelets: 238 10*3/uL (ref 150–400)
RBC: 3.56 MIL/uL — ABNORMAL LOW (ref 3.87–5.11)
RDW: 15 % (ref 11.5–15.5)
WBC: 12.7 10*3/uL — ABNORMAL HIGH (ref 4.0–10.5)
nRBC: 0 % (ref 0.0–0.2)

## 2020-09-02 LAB — BASIC METABOLIC PANEL
Anion gap: 5 (ref 5–15)
BUN: 12 mg/dL (ref 8–23)
CO2: 22 mmol/L (ref 22–32)
Calcium: 8.2 mg/dL — ABNORMAL LOW (ref 8.9–10.3)
Chloride: 112 mmol/L — ABNORMAL HIGH (ref 98–111)
Creatinine, Ser: 0.64 mg/dL (ref 0.44–1.00)
GFR, Estimated: >60 mL/min (ref >60)
Glucose, Bld: 161 mg/dL — ABNORMAL HIGH (ref 70–99)
Potassium: 3.9 mmol/L (ref 3.5–5.1)
Sodium: 139 mmol/L (ref 135–145)

## 2020-09-02 MED ORDER — POTASSIUM CHLORIDE CRYS ER 20 MEQ PO TBCR
20.0000 meq | EXTENDED_RELEASE_TABLET | Freq: Once | ORAL | Status: AC
  Administered 2020-09-02: 20 meq via ORAL
  Filled 2020-09-02: qty 1

## 2020-09-02 MED ORDER — CHLORHEXIDINE GLUCONATE CLOTH 2 % EX PADS
6.0000 | MEDICATED_PAD | Freq: Every day | CUTANEOUS | Status: DC
  Administered 2020-09-02 – 2020-09-03 (×2): 6 via TOPICAL

## 2020-09-02 NOTE — Discharge Summary (Signed)
Physician Discharge Summary  Patient ID: Jordan Mahoney MRN: [id number] DOB/AGE: 1950-04-17 70 y.o.  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 Valeta Harms for evaluation of a right upper lobe pulmonary nodule.  The patient has a previous history of stage II colon carcinoma that was treated surgically and on her surveillance imaging she was noted to have an increase in size of a right upper lobe nodule.  The patient was asymptomatic in this regard.  She does have a previous tobacco use history however, this is remote.  She quit smoking in the early 1980s.  The patient was seen in cardiothoracic surgical consultation by Dr. Kipp Brood who evaluated the patient and all relevant studies and recommended proceeding with resection.  She was admitted this hospitalization for robotic assisted thoracoscopic surgery for right upper lobectomy.   Hospital Course: Patient was transferred from the OR to PACU in stable condition. Aline and foley was removed in PACU. Chest tube was placed to water  seal. There was a small air leak with cough. Daily chest x rays were obtained and showed a stable, moderate right pneumothorax. Air leak did resolve and chest tube was clamped on 07/20. Follow up chest x ray showed right pneumothorax slightly less than previously. As discussed with Dr. Kipp Brood, will have nurse remove chest tube. Patient is ambulating on room air. All wounds are clean, dry, and healing without signs of infection. Per Dr. Kipp Brood, patient does not require another CXR before discharge.. Patient is felt surgically stable for discharge today.   Consults: None  Significant Diagnostic Studies: DG Chest 2 View  Result Date: 09/01/2020 CLINICAL DATA:  Preoperative respiratory evaluation. EXAM: CHEST - 2 VIEW COMPARISON:  08/05/2020.  CT scan 07/29/2020 FINDINGS: Right lung nodule characterized on previous CT is not well seen on x-ray. Fiducial markers noted right mid lung. No focal airspace consolidation or pulmonary edema. No pneumothorax or pleural effusion. The cardiopericardial silhouette is within normal limits for size. IMPRESSION: No acute cardiopulmonary findings. Electronically Signed   By: Misty Stanley M.D.   On: 09/01/2020 05:58   DG CHEST PORT 1 VIEW  Result Date: 09/03/2020 CLINICAL DATA:  Chest tube. EXAM: PORTABLE CHEST 1 VIEW COMPARISON:  09/02/2020. FINDINGS: Right IJ line and right chest tube in stable position. Right pneumothorax again noted with slight improvement from prior exam. Postsurgical changes right lung with persistent right perihilar atelectasis/infiltrate. Mild left base subsegmental atelectasis. No pleural effusion. Stable cardiomegaly. Prior cervical spine fusion. IMPRESSION: 1. Right IJ line and right chest tube in stable position. Right pneumothorax again noted with slight improvement from prior exam.  2. Postsurgical changes right lung with persistent right perihilar atelectasis/infiltrate. Mild left base subsegmental atelectasis. 3.  Stable cardiomegaly.  Electronically Signed   By: Marcello Moores  Register   On: 09/03/2020 07:17   DG CHEST PORT 1 VIEW  Result Date: 09/02/2020 CLINICAL DATA:  Prior lobectomy.  Chest tube. EXAM: PORTABLE CHEST 1 VIEW COMPARISON:  09/01/2020. FINDINGS: Right IJ line and right chest tube in stable position. Stable moderate right-sided pneumothorax. Postsurgical changes right lung. Progressive right perihilar atelectasis/infiltrate noted on today's exam. Mild left base subsegmental atelectasis. Cardiomegaly. No pulmonary venous congestion. Prior cervical spine fusion. IMPRESSION: 1. Right IJ line right chest tube in stable position. Stable moderate right-sided pneumothorax. 2. Postsurgical changes right lung. Progressive right perihilar atelectasis/infiltrate noted on today's exam. Mild left base subsegmental atelectasis. 3.  Cardiomegaly.  No pulmonary venous congestion. Electronically Signed   By: Marcello Moores  Register   On: 09/02/2020 06:57   DG Chest Port 1 View  Result Date: 09/01/2020 CLINICAL DATA:  Right chest tube in place after right upper lobectomy today. EXAM: PORTABLE CHEST 1 VIEW COMPARISON:  PA and lateral chest 07/30/2020. FINDINGS: Right chest tube is in place with its tip in the lung apex. There is a right pneumothorax estimated at 25%. Right IJ catheter tip projects in the lower superior vena cava. Suture material is seen in the right hilum. Fiducial markers on the prior study have been removed. The left lung is expanded and clear. Heart size is normal. No pleural fluid. No acute bony abnormality. IMPRESSION: Status post right upper lobectomy today. The patient has a right pneumothorax estimated at 25% with a right chest tube in place. Right IJ catheter tip projects in the lower superior vena cava. Electronically Signed   By: Inge Rise M.D.   On: 09/01/2020 14:04   DG Chest Port 1V same Day  Result Date: 09/03/2020 CLINICAL DATA:  Clamped right-sided chest tube, history of pneumothorax EXAM: PORTABLE CHEST 1 VIEW  COMPARISON:  09/03/2020 at 5:44 a.m. FINDINGS: Single frontal view of the chest demonstrates stable position of the right chest tube. The small right apical pneumothorax seen on the previous study is unchanged in size. No airspace disease or effusion. The cardiac silhouette is unremarkable. Stable postsurgical changes at the right hilum. IMPRESSION: 1. Stable small right apical pneumothorax. 2. Stable postsurgical changes of the right ilium Electronically Signed   By: Randa Ngo M.D.   On: 09/03/2020 15:13    Treatments: surgery:  09/01/2020   11:13 AM   PATIENT:  Jordan Mahoney  70 y.o. female   PRE-OPERATIVE DIAGNOSIS:  Lung Cancer   POST-OPERATIVE DIAGNOSIS:  Lung Cancer   PROCEDURE:   - Robotic assisted right video thoracoscopy - Right upper lobectomy - Mediastinal lymph node sampling - Intercostal nerve block SURGEON:  Surgeon(s) and Role:    * Lightfoot, Lucile Crater, MD - Primary   PHYSICIAN ASSISTANT: Brayln Duque PA-C   ASSISTANTS: none    ANESTHESIA:   general   EBL:  50 CC   Pathology:  FINAL MICROSCOPIC DIAGNOSIS:   A. LYMPH NODE, 9, EXCISION:  -  No carcinoma identified in one lymph node (0/1)   B. LYMPH NODE, 9 #2, EXCISION:  -  No carcinoma identified in one lymph node (0/1)   C. LYMPH NODE, HILAR , EXCISION:  -  No carcinoma identified in one lymph node (0/1)   D. LYMPH NODE, HILAR #2, EXCISION:  -  No carcinoma identified in one lymph node (0/1)   E.  LYMPH NODE, HILAR #3, EXCISION:  -  No carcinoma identified in one lymph node (0/1)   F. LYMPH NODE, HILAR #4, EXCISION:  -  No carcinoma identified in one lymph node (0/1)   G. LYMPH NODE, HILAR #5, EXCISION:  -  No carcinoma identified in one lymph node (0/1)   H. LYMPH NODE, HILAR #6, EXCISION:  -  No carcinoma identified in one lymph node (0/1)   I. LYMPH NODE, 4, EXCISION:  -  No carcinoma identified in one lymph node (0/1)   J. LYMPH NODE, 4 #2, EXCISION:  -  No carcinoma identified in  one lymph node (0/1)   K. LUNG, RIGHT UPPER LOBE, LOBECTOMY:  -  Invasive adenocarcinoma, well-differentiated, 1.1 cm  -  Margins uninvolved by adenocarcinoma  -  No carcinoma identified in one lymph node (0/1)  -  See oncology table and comment below   TNM Code:pT1b, pN0 Dr.Lightfoot to discuss pathology with patient  Discharge Exam: Blood pressure 121/72, pulse 60, temperature 97.7 F (36.5 C), temperature source Oral, resp. rate 17, height 5\' 6"  (1.676 m), weight 90.7 kg, SpO2 95 %.  Cardiovascular: RRR, Pulmonary: Mostly clear Abdomen: Soft, non tender, bowel sounds present. Extremities: No LE edema Wounds: Clean and dry.  No erythema or signs of infection. Chest Tube: to water seal,  tidling but no true air leak with cough    Disposition: Discharge disposition: 01-Home or Self Care       Allergies as of 09/03/2020       Reactions   Sulfa Antibiotics Other (See Comments)   Causes skin reaction ( on her mid back)  Pigmentation.  A fixed drug reaction   Tape Other (See Comments)   Blister when pulled of or stays on too long   Valium [diazepam]    Violent dreams   Latex Hives, Rash   Other Itching, Rash   Metal contact allergy Blister        Medication List     TAKE these medications    Ajovy 225 MG/1.5ML Soaj Generic drug: Fremanezumab-vfrm Inject 225 mg into the skin every 30 (thirty) days.   azelastine 0.1 % nasal spray Commonly known as: ASTELIN Place 2 sprays into both nostrils daily. What changed: when to take this   CALCIUM CITRATE PO Take 1,000 mg by mouth at bedtime. Bariatric Advantage   cyproheptadine 4 MG tablet Commonly known as: PERIACTIN Take 1 tablet (4 mg total) by mouth at bedtime.   dorzolamide-timolol 22.3-6.8 MG/ML ophthalmic solution Commonly known as: COSOPT Place 1 drop into the right eye 2 (two) times daily.   eletriptan 40 MG tablet Commonly known as: Relpax Take 1 tablet (40 mg total) by mouth every 2 (two) hours as  needed for migraine. 40 mg on set May repeat in 2 hours if headache persists or recurs. Not to exceed 2 tabs/24hours.   folic acid 1 MG tablet Commonly known as: FOLVITE Take 1 mg by mouth in the morning.   gabapentin 400 MG capsule Commonly known as: Neurontin Take 1 capsule (400 mg total) by mouth 2 (two) times daily.   Krill Oil 350 MG Caps Take 350 mg by mouth daily.   levothyroxine 75 MCG tablet Commonly known as: SYNTHROID Take 1 tablet (75 mcg total) by mouth daily before breakfast.   Magnesium Oxide 400 MG Caps Take 400 mg by mouth in the morning and at bedtime.   methotrexate 2.5 MG tablet Commonly known as: RHEUMATREX Take 15 mg by mouth  every Monday. As directed by doctor.   multivitamin tablet Take 1 tablet by mouth 2 (two) times daily. Bariatric Advantage   omeprazole 20 MG capsule Commonly known as: PriLOSEC Take 1 capsule (20 mg total) by mouth in the morning and at bedtime.   prednisoLONE acetate 1 % ophthalmic suspension Commonly known as: PRED FORTE Place 1 drop into the right eye daily.   riboflavin 100 MG Tabs tablet Commonly known as: VITAMIN B-2 Take 100 mg by mouth in the morning and at bedtime.   solifenacin 10 MG tablet Commonly known as: VESICARE Take 10 mg by mouth in the morning.   tiZANidine 4 MG capsule Commonly known as: ZANAFLEX Take 4 mg by mouth 3 (three) times daily as needed for muscle spasms.   traMADol 50 MG tablet Commonly known as: ULTRAM Take 1 tablet (50 mg total) by mouth every 6 (six) hours as needed (mild pain).   Vitamin B12 1000 MCG Tbcr Take 1,000 mcg by mouth in the morning.   VITAMIN C PO Take 1 mg by mouth 2 (two) times daily.   Vitamin D3 125 MCG (5000 UT) Caps Take 5,000 Units by mouth daily.        Follow-up Information     Lightfoot, Lucile Crater, MD Follow up.   Specialty: Cardiothoracic Surgery Why: Please see your discharge paperwork for follow-up appointment with surgeon.  Also make a MYCHART  ACCOUNT WHICH WILL ASSIST WITH MONITORING YOUR APPOINTMENTS. Contact information: [place] [address] [phone]                 Signed: Arnoldo Lenis 09/03/2020, 2:03 PM

## 2020-09-02 NOTE — Progress Notes (Addendum)
      [address]       [place] [address]             [phone]       1 Day Post-Op Procedure(s) (LRB): XI ROBOTIC ASSISTED THORASCOPY- RIGHT UPPER LOBECTOMY (Right) INTERCOSTAL NERVE BLOCK (Right) NODE DISSECTION (Right)  Subjective: Patient concerned that she has an allergy to metal and there are metal snaps on her gown.   Objective: Vital signs in last 24 hours: Temp:  [97.3 F (36.3 C)-98.3 F (36.8 C)] 98.2 F (36.8 C) (07/18 1635) Pulse Rate:  [57-76] 69 (07/18 1635) Cardiac Rhythm: Normal sinus rhythm (07/19 0521) Resp:  [8-23] 13 (07/18 1635) BP: (103-156)/(59-100) 156/81 (07/18 1635) SpO2:  [96 %-100 %] 98 % (07/18 1635) Arterial Line BP: (117-126)/(66-73) 126/73 (07/18 1156) FiO2 (%):  [21 %] 21 % (07/18 1139)     Intake/Output from previous day: 07/18 0701 - 07/19 0700 In: 1300 [I.V.:1000; IV Piggyback:300] Out: 588 [Urine:350; Blood:100; Chest Tube:138]   Physical Exam:  Cardiovascular: RRR, Pulmonary: Clear to auscultation on left and coarse on the right Abdomen: Soft, non tender, bowel sounds present. Extremities: SCDs in place Wounds: Clean and dry.  No erythema or signs of infection. Chest Tube: to water seal,  air leak with cough   Lab Results: CBC: Recent Labs    09/02/20 0125  WBC 12.7*  HGB 10.4*  HCT 32.6*  PLT 238   BMET:  Recent Labs    09/02/20 0208  NA 139  K 3.9  CL 112*  CO2 22  GLUCOSE 161*  BUN 12  CREATININE 0.64  CALCIUM 8.2*    PT/INR: No results for input(s): LABPROT, INR in the last 72 hours. ABG:  INR: Will add last result for INR, ABG once components are confirmed Will add last 4 CBG results once components are confirmed  Assessment/Plan:  1. CV - SR with HR in the 60's. 2.  Pulmonary - Chest tube with 138 cc of output since surgery. Chest tube is to water seal. There is an air leak with cough. CXR this am shows stable, moderate right pneumothorax. Will discuss with Dr. Kipp Brood,  but likely chest tube to remain to water seal as right pneumothorax is stable and patient is not hypoxic or tachypneic. Encourage incentive spirometer. 3. Supplement potassium 4. Expected post op blood loss anemia-H and H this am 10.4 and 32.6 5. History of hypothyroidism-continue Levothyroxine 75 mcg daily  Donielle M ZimmermanPA-C 09/02/2020,7:05 AM [phone]   Agree with above. Small leak. Continue pulmonary hygiene.  Manasvi Dickard Bary Leriche

## 2020-09-02 NOTE — Hospital Course (Addendum)
History of the present illness:  Patient is a 70 year old female referred by Dr. Valeta Harms for evaluation of a right upper lobe pulmonary nodule.  The patient has a previous history of stage II colon carcinoma that was treated surgically and on her surveillance imaging she was noted to have an increase in size of a right upper lobe nodule.  The patient was asymptomatic in this regard.  She does have a previous tobacco use history however, this is remote.  She quit smoking in the early 1980s.  The patient was seen in cardiothoracic surgical consultation by Dr. Kipp Brood who evaluated the patient and all relevant studies and recommended proceeding with resection.  She was admitted this hospitalization for robotic assisted thoracoscopic surgery for right upper lobectomy.    Hospital Course: Patient was transferred from the OR to PACU in stable condition. Aline and foley was removed in PACU. Chest tube was placed to water seal. There was a small air leak with cough. Daily chest x rays were obtained and showed a stable, moderate right pneumothorax. Air leak did resolve and chest tube was clamped on 07/20. Follow up chest x ray showed right pneumothorax slightly less than previously. As discussed with Dr. Kipp Brood, will have nurse remove chest tube. Patient is ambulating on room air. All wounds are clean, dry, and healing without signs of infection. Per Dr. Kipp Brood, patient does not require another CXR before discharge.. Patient is felt surgically stable for discharge today.

## 2020-09-03 ENCOUNTER — Inpatient Hospital Stay (HOSPITAL_COMMUNITY): Payer: Medicare Other

## 2020-09-03 LAB — COMPREHENSIVE METABOLIC PANEL
ALT: 23 U/L (ref 0–44)
AST: 32 U/L (ref 15–41)
Albumin: 2.8 g/dL — ABNORMAL LOW (ref 3.5–5.0)
Alkaline Phosphatase: 78 U/L (ref 38–126)
Anion gap: 5 (ref 5–15)
BUN: 9 mg/dL (ref 8–23)
CO2: 23 mmol/L (ref 22–32)
Calcium: 8.2 mg/dL — ABNORMAL LOW (ref 8.9–10.3)
Chloride: 113 mmol/L — ABNORMAL HIGH (ref 98–111)
Creatinine, Ser: 0.66 mg/dL (ref 0.44–1.00)
GFR, Estimated: >60 mL/min (ref >60)
Glucose, Bld: 94 mg/dL (ref 70–99)
Potassium: 3.9 mmol/L (ref 3.5–5.1)
Sodium: 141 mmol/L (ref 135–145)
Total Bilirubin: 0.9 mg/dL (ref 0.3–1.2)
Total Protein: 5.1 g/dL — ABNORMAL LOW (ref 6.5–8.1)

## 2020-09-03 LAB — CBC
HCT: 31.6 % — ABNORMAL LOW (ref 36.0–46.0)
Hemoglobin: 9.8 g/dL — ABNORMAL LOW (ref 12.0–15.0)
MCH: 28.9 pg (ref 26.0–34.0)
MCHC: 31 g/dL (ref 30.0–36.0)
MCV: 93.2 fL (ref 80.0–100.0)
Platelets: 202 10*3/uL (ref 150–400)
RBC: 3.39 MIL/uL — ABNORMAL LOW (ref 3.87–5.11)
RDW: 15.5 % (ref 11.5–15.5)
WBC: 7.9 10*3/uL (ref 4.0–10.5)
nRBC: 0 % (ref 0.0–0.2)

## 2020-09-03 LAB — SURGICAL PATHOLOGY

## 2020-09-03 MED ORDER — TRAMADOL HCL 50 MG PO TABS: 50.0000 mg | ORAL_TABLET | Freq: Four times a day (QID) | ORAL | 0 refills | Status: DC | PRN

## 2020-09-03 MED ORDER — ELETRIPTAN HYDROBROMIDE 40 MG PO TABS: 40.0000 mg | ORAL_TABLET | ORAL | Status: DC | PRN

## 2020-09-03 MED ORDER — CYPROHEPTADINE HCL 4 MG PO TABS: 4.0000 mg | ORAL_TABLET | Freq: Every day | ORAL | Status: DC

## 2020-09-03 MED ORDER — POTASSIUM CHLORIDE CRYS ER 10 MEQ PO TBCR
30.0000 meq | EXTENDED_RELEASE_TABLET | Freq: Once | ORAL | Status: AC
  Administered 2020-09-03: 30 meq via ORAL
  Filled 2020-09-03: qty 3

## 2020-09-03 NOTE — Progress Notes (Signed)
Patient IJ, chest tube, and IV removal well tolerated, removed by order of MD. Discharge reviewed with patient and her husband at bedside, questions asked and answered, all belongings with patient. Patient getting dressed to be wheeled out to be transported home by family car.

## 2020-09-03 NOTE — Plan of Care (Signed)
Problem: Education: Goal: Knowledge of General Education information will improve Description: Including pain rating scale, medication(s)/side effects and non-pharmacologic comfort measures 09/03/2020 1414 by Shelton Silvas, RN Outcome: Adequate for Discharge 09/03/2020 1414 by Shelton Silvas, RN Outcome: Adequate for Discharge   Problem: Health Behavior/Discharge Planning: Goal: Ability to manage health-related needs will improve 09/03/2020 1414 by Shelton Silvas, RN Outcome: Adequate for Discharge 09/03/2020 1414 by Shelton Silvas, RN Outcome: Adequate for Discharge   Problem: Clinical Measurements: Goal: Ability to maintain clinical measurements within normal limits will improve 09/03/2020 1414 by Shelton Silvas, RN Outcome: Adequate for Discharge 09/03/2020 1414 by Shelton Silvas, RN Outcome: Adequate for Discharge Goal: Will remain free from infection 09/03/2020 1414 by Shelton Silvas, RN Outcome: Adequate for Discharge 09/03/2020 1414 by Shelton Silvas, RN Outcome: Adequate for Discharge Goal: Diagnostic test results will improve 09/03/2020 1414 by Shelton Silvas, RN Outcome: Adequate for Discharge 09/03/2020 1414 by Shelton Silvas, RN Outcome: Adequate for Discharge Goal: Respiratory complications will improve 09/03/2020 1414 by Shelton Silvas, RN Outcome: Adequate for Discharge 09/03/2020 1414 by Shelton Silvas, RN Outcome: Adequate for Discharge Goal: Cardiovascular complication will be avoided 09/03/2020 1414 by Shelton Silvas, RN Outcome: Adequate for Discharge 09/03/2020 1414 by Shelton Silvas, RN Outcome: Adequate for Discharge   Problem: Activity: Goal: Risk for activity intolerance will decrease 09/03/2020 1414 by Shelton Silvas, RN Outcome: Adequate for Discharge 09/03/2020 1414 by Shelton Silvas, RN Outcome: Adequate for Discharge   Problem: Nutrition: Goal: Adequate nutrition will be maintained 09/03/2020 1414 by Shelton Silvas, RN Outcome:  Adequate for Discharge 09/03/2020 1414 by Shelton Silvas, RN Outcome: Adequate for Discharge   Problem: Coping: Goal: Level of anxiety will decrease 09/03/2020 1414 by Shelton Silvas, RN Outcome: Adequate for Discharge 09/03/2020 1414 by Shelton Silvas, RN Outcome: Adequate for Discharge   Problem: Elimination: Goal: Will not experience complications related to bowel motility 09/03/2020 1414 by Shelton Silvas, RN Outcome: Adequate for Discharge 09/03/2020 1414 by Shelton Silvas, RN Outcome: Adequate for Discharge Goal: Will not experience complications related to urinary retention 09/03/2020 1414 by Shelton Silvas, RN Outcome: Adequate for Discharge 09/03/2020 1414 by Shelton Silvas, RN Outcome: Adequate for Discharge   Problem: Pain Managment: Goal: General experience of comfort will improve 09/03/2020 1414 by Shelton Silvas, RN Outcome: Adequate for Discharge 09/03/2020 1414 by Shelton Silvas, RN Outcome: Adequate for Discharge   Problem: Safety: Goal: Ability to remain free from injury will improve 09/03/2020 1414 by Shelton Silvas, RN Outcome: Adequate for Discharge 09/03/2020 1414 by Shelton Silvas, RN Outcome: Adequate for Discharge   Problem: Skin Integrity: Goal: Risk for impaired skin integrity will decrease 09/03/2020 1414 by Shelton Silvas, RN Outcome: Adequate for Discharge 09/03/2020 1414 by Shelton Silvas, RN Outcome: Adequate for Discharge   Problem: Education: Goal: Knowledge of disease or condition will improve 09/03/2020 1414 by Shelton Silvas, RN Outcome: Adequate for Discharge 09/03/2020 1414 by Shelton Silvas, RN Outcome: Adequate for Discharge Goal: Knowledge of the prescribed therapeutic regimen will improve 09/03/2020 1414 by Shelton Silvas, RN Outcome: Adequate for Discharge 09/03/2020 1414 by Shelton Silvas, RN Outcome: Adequate for Discharge   Problem: Activity: Goal: Risk for activity intolerance will decrease 09/03/2020 1414 by  Shelton Silvas, RN Outcome: Adequate for Discharge 09/03/2020 1414 by Shelton Silvas, RN Outcome: Adequate for Discharge   Problem: Cardiac: Goal:  Will achieve and/or maintain hemodynamic stability 09/03/2020 1414 by Shelton Silvas, RN Outcome: Adequate for Discharge 09/03/2020 1414 by Shelton Silvas, RN Outcome: Adequate for Discharge   Problem: Clinical Measurements: Goal: Postoperative complications will be avoided or minimized 09/03/2020 1414 by Shelton Silvas, RN Outcome: Adequate for Discharge 09/03/2020 1414 by Shelton Silvas, RN Outcome: Adequate for Discharge   Problem: Respiratory: Goal: Respiratory status will improve 09/03/2020 1414 by Shelton Silvas, RN Outcome: Adequate for Discharge 09/03/2020 1414 by Shelton Silvas, RN Outcome: Adequate for Discharge   Problem: Pain Management: Goal: Pain level will decrease 09/03/2020 1414 by Shelton Silvas, RN Outcome: Adequate for Discharge 09/03/2020 1414 by Shelton Silvas, RN Outcome: Adequate for Discharge   Problem: Skin Integrity: Goal: Wound healing without signs and symptoms infection will improve 09/03/2020 1414 by Shelton Silvas, RN Outcome: Adequate for Discharge 09/03/2020 1414 by Shelton Silvas, RN Outcome: Adequate for Discharge

## 2020-09-03 NOTE — Progress Notes (Addendum)
I reviewed CXR and right pneumothorax appears slightly less so than previously. As discussed with Dr. Kipp Brood, will have nurse remove chest tube. Patient prefers to go home today. Per Dr. Kipp Brood, no other chest x ray needed. Follow up instructions reviewed with patient. Her husband is at bedside. Will discharge later today

## 2020-09-03 NOTE — Discharge Instructions (Signed)
[facility] office [phone]   Robot-Assisted Thoracic Surgery, Care After The following information offers guidance on how to care for yourself after your procedure. Your health care provider may also give you more specific instructions. If you have problems or questions, contact your health careprovider. What can I expect after the procedure? After the procedure, it is common to have: Some pain and aches in the area of your surgical incisions. Pain when breathing in (inhaling) and coughing. Tiredness (fatigue). Trouble sleeping. Constipation. Follow these instructions at home: Medicines Take over-the-counter and prescription medicines only as told by your health care provider. If you were prescribed an antibiotic medicine, take it as told by your health care provider. Do not stop taking the antibiotic even if you start to feel better. Talk with your health care provider about safe and effective ways to manage pain after your procedure. Pain management should fit your specific health needs. Take pain medicine before pain becomes severe. Relieving and controlling your pain will make breathing easier for you. Ask your health care provider if the medicine prescribed to you requires you to avoid driving or using machinery. Eating and drinking Follow instructions from your health care provider about eating or drinking restrictions. These will vary depending on what procedure you had. Your health care provider may recommend: A liquid diet or soft diet for the first few days. Meals that are smaller and more frequent. A diet of fruits, vegetables, whole grains, and low-fat proteins. Limiting foods that are high in fat and processed sugar, including fried or sweet foods. Incision care Follow instructions from your health care provider about how to take care of your incisions. Make sure you: Wash your hands with soap and water for at least 20 seconds before and after you change your bandage (dressing).  If soap and water are not available, use hand sanitizer. Change your dressing as told by your health care provider. Leave stitches (sutures), skin glue, or adhesive strips in place. These skin closures may need to stay in place for 2 weeks or longer. If adhesive strip edges start to loosen and curl up, you may trim the loose edges. Do not remove adhesive strips completely unless your health care provider tells you to do that. Check your incision area every day for signs of infection. Check for: Redness, swelling, or more pain. Fluid or blood. Warmth. Pus or a bad smell. Activity Return to your normal activities as told by your health care provider. Ask your health care provider what activities are safe for you. Ask your health care provider when it is safe for you to drive. Do not lift anything that is heavier than 10 lb (4.5 kg), or the limit that you are told, until your health care provider says that it is safe. Rest as told by your health care provider. Avoid sitting for a long time without moving. Get up to take short walks every 1-2 hours. This is important to improve blood flow and breathing. Ask for help if you feel weak or unsteady. Do exercises as told by your health care provider. Pneumonia prevention  Do deep breathing exercises and cough regularly as directed. This helps clear mucus and opens your lungs. Doing this helps prevent lung infection (pneumonia). If you were given an incentive spirometer, use it as told. An incentive spirometer is a tool that measures how well you are filling your lungs with each breath. Coughing may hurt less if you try to support your chest. This is called splinting. Try one  of these when you cough: Hold a pillow against your chest. Place the palms of both hands on top of your incision area. Do not use any products that contain nicotine or tobacco. These products include cigarettes, chewing tobacco, and vaping devices, such as e-cigarettes. If you need  help quitting, ask your health care provider. Avoid secondhand smoke.   General instructions If you have a drainage tube: Follow instructions from your health care provider about how to take care of it. Do not travel by airplane after your tube is removed until your health care provider tells you it is safe. You may need to take these actions to prevent or treat constipation: Drink enough fluid to keep your urine pale yellow. Take over-the-counter or prescription medicines. Eat foods that are high in fiber, such as beans, whole grains, and fresh fruits and vegetables. Limit foods that are high in fat and processed sugars, such as fried or sweet foods. Keep all follow-up visits. This is important. Contact a health care provider if: You have redness, swelling, or more pain around an incision. You have fluid or blood coming from an incision. An incision feels warm to the touch. You have pus or a bad smell coming from an incision. You have a fever. You cannot eat or drink without vomiting. Your pain medicine is not controlling your pain. Get help right away if: You have chest pain. Your heart is beating quickly. You have trouble breathing. You have trouble speaking. You are confused. You feel weak or dizzy, or you faint. These symptoms may represent a serious problem that is an emergency. Do not wait to see if the symptoms will go away. Get medical help right away. Call your local emergency services (911 in the [place] Do not drive yourself to the hospital. Summary Talk with your health care provider about safe and effective ways to manage pain after your procedure. Pain management should fit your specific health needs. Return to your normal activities as told by your health care provider. Ask your health care provider what activities are safe for you. Do deep breathing exercises and cough regularly as directed. This helps to clear mucus and prevent pneumonia. If it hurts to cough, ease  pain by holding a pillow against your chest or by placing the palms of both hands over your incisions. This information is not intended to replace advice given to you by your health care provider. Make sure you discuss any questions you have with your healthcare provider. Document Revised: 10/26/2019 Document Reviewed: 10/26/2019 Elsevier Patient Education  2022 [employer].

## 2020-09-03 NOTE — Progress Notes (Addendum)
      [address]       [place] [address]             [phone]       2 Days Post-Op Procedure(s) (LRB): XI ROBOTIC ASSISTED THORASCOPY- RIGHT UPPER LOBECTOMY (Right) INTERCOSTAL NERVE BLOCK (Right) NODE DISSECTION (Right)  Subjective: Patient has incisional, chest tube pain this am.  Objective: Vital signs in last 24 hours: Temp:  [97.7 F (36.5 C)-98.7 F (37.1 C)] 97.7 F (36.5 C) (07/20 0414) Pulse Rate:  [60-62] 60 (07/20 0414) Cardiac Rhythm: Sinus bradycardia (07/19 1904) Resp:  [14-17] 17 (07/20 0414) BP: (117-134)/(60-74) 121/72 (07/20 0414) SpO2:  [94 %-95 %] 95 % (07/20 0414)     Intake/Output from previous day: 07/19 0701 - 07/20 0700 In: 360 [P.O.:360] Out: 60 [Chest Tube:60]   Physical Exam:  Cardiovascular: RRR, Pulmonary: Mostly clear Abdomen: Soft, non tender, bowel sounds present. Extremities: No LE edema Wounds: Clean and dry.  No erythema or signs of infection. Chest Tube: to water seal,  tidling but no true air leak with cough   Lab Results: CBC: Recent Labs    09/02/20 0125 09/03/20 0500  WBC 12.7* 7.9  HGB 10.4* 9.8*  HCT 32.6* 31.6*  PLT 238 202    BMET:  Recent Labs    09/02/20 0208 09/03/20 0500  NA 139 141  K 3.9 3.9  CL 112* 113*  CO2 22 23  GLUCOSE 161* 94  BUN 12 9  CREATININE 0.64 0.66  CALCIUM 8.2* 8.2*     PT/INR: No results for input(s): LABPROT, INR in the last 72 hours. ABG:  INR: Will add last result for INR, ABG once components are confirmed Will add last 4 CBG results once components are confirmed  Assessment/Plan:  1. CV - SR/SB at times. 2.  Pulmonary - Chest tube recorded with 60 cc last 12 hours. Chest tube is to water seal. There is tidling but NO air leak with cough. CXR this am shows stable, moderate right pneumothorax.  As discussed with Dr. Kipp Brood, likely clamp chest tube and take another CXR later today. If CXR stable, likely remove cheset tube.  Await final  pathology. Encourage incentive spirometer. 3. Supplement potassium 4. Expected post op blood loss anemia-H and H this am slightly decreased to 9.8 and 31.6 5. History of hypothyroidism-continue Levothyroxine 75 mcg daily 6. Ambulate tid 7. Remove central line  Cierra Rothgeb M ZimmermanPA-C 09/03/2020,7:03 AM [phone]

## 2020-09-05 ENCOUNTER — Ambulatory Visit (INDEPENDENT_AMBULATORY_CARE_PROVIDER_SITE_OTHER): Payer: Medicare Other

## 2020-09-05 DIAGNOSIS — J309 Allergic rhinitis, unspecified: Secondary | ICD-10-CM | POA: Diagnosis not present

## 2020-09-11 ENCOUNTER — Other Ambulatory Visit: Payer: Self-pay | Admitting: *Deleted

## 2020-09-11 NOTE — Progress Notes (Signed)
The proposed treatment discussed in cancer conference is for discussion purpose only and is not a binding recommendation. The patient was not physically examined nor present for their treatment options. Therefore, final treatment plans cannot be decided.  ?

## 2020-09-12 ENCOUNTER — Telehealth: Payer: Medicare Other | Admitting: Thoracic Surgery (Cardiothoracic Vascular Surgery)

## 2020-09-15 ENCOUNTER — Telehealth (INDEPENDENT_AMBULATORY_CARE_PROVIDER_SITE_OTHER): Payer: Self-pay | Admitting: Thoracic Surgery (Cardiothoracic Vascular Surgery)

## 2020-09-15 ENCOUNTER — Other Ambulatory Visit: Payer: Self-pay

## 2020-09-15 ENCOUNTER — Encounter: Payer: Self-pay | Admitting: Thoracic Surgery (Cardiothoracic Vascular Surgery)

## 2020-09-15 DIAGNOSIS — Z902 Acquired absence of lung [part of]: Secondary | ICD-10-CM

## 2020-09-15 NOTE — Progress Notes (Signed)
[address]       [place] [address]             [phone]       Patient: Home Provider: Office Consent for Telemedicine visit obtained.  Today's visit was completed via a real-time telehealth (see specific modality noted below). The patient/authorized person provided oral consent at the time of the visit to engage in a telemedicine encounter with the present provider at [facility]. The patient/authorized person was informed of the potential benefits, limitations, and risks of telemedicine. The patient/authorized person expressed understanding that the laws that protect confidentiality also apply to telemedicine. The patient/authorized person acknowledged understanding that telemedicine does not provide emergency services and that he or she would need to call 911 or proceed to the nearest hospital for help if such a need arose.   Total time spent in the clinical discussion 10 minutes.  Telehealth Modality: Phone visit (audio only)  I had a telephone visit with Mrs. Smithey.  Overall she is doing well.  She only has pain with coughs.  She is having some productive sputum but she denies any fevers or chills.  She occasionally uses her incentive spirometer.  I reviewed the pathology with her, and all of her lymph nodes are negative.  The tumor was only 1.1 cm and the margins were negative as well.  This puts her at a stage Ia non-small cell lung cancer.  I will schedule an appointment for her to be with the nurses for stitch removal.  She is also scheduled to follow-up with me on the 18th of this month with a chest x-ray.  FINAL MICROSCOPIC DIAGNOSIS:   A. LYMPH NODE, 9, EXCISION:  -  No carcinoma identified in one lymph node (0/1)   B. LYMPH NODE, 9 #2, EXCISION:  -  No carcinoma identified in one lymph node (0/1)   C. LYMPH NODE, HILAR , EXCISION:  -  No carcinoma identified in one lymph node (0/1)   D. LYMPH NODE, HILAR #2, EXCISION:  -  No carcinoma  identified in one lymph node (0/1)   E. LYMPH NODE, HILAR #3, EXCISION:  -  No carcinoma identified in one lymph node (0/1)   F. LYMPH NODE, HILAR #4, EXCISION:  -  No carcinoma identified in one lymph node (0/1)   G. LYMPH NODE, HILAR #5, EXCISION:  -  No carcinoma identified in one lymph node (0/1)   H. LYMPH NODE, HILAR #6, EXCISION:  -  No carcinoma identified in one lymph node (0/1)   I. LYMPH NODE, 4, EXCISION:  -  No carcinoma identified in one lymph node (0/1)   J. LYMPH NODE, 4 #2, EXCISION:  -  No carcinoma identified in one lymph node (0/1)   K. LUNG, RIGHT UPPER LOBE, LOBECTOMY:  -  Invasive adenocarcinoma, well-differentiated, 1.1 cm  -  Margins uninvolved by adenocarcinoma  -  No carcinoma identified in one lymph node (0/1)  -  See oncology table and comment below   ONCOLOGY TABLE:   LUNG: Resection   Synchronous Tumors: Not applicable  Total Number of Primary Tumors: 1  Procedure: Lobectomy  Specimen Laterality: Right  Tumor Focality: Unifocal  Tumor Site: Upper lobe of lung  Tumor Size: 1.1 cm  Histologic Type: Invasive lepidic adenocarcinoma       Acinar: 5%       Lepidic: 90%       Papillary: 5%  Visceral Pleura Invasion: Not identified  Direct Invasion of Adjacent Structures: No adjacent structures present  Lymphovascular Invasion: Not identified  Margins: All margins negative for invasive carcinoma       Closest Margin(s) to Invasive Carcinoma: Bronchial margin, 2.5 cm       Margin(s) Involved by Invasive Carcinoma: Not applicable        Margin Status for Non-Invasive Tumor: Not applicable  Treatment Effect: No known presurgical therapy  Regional Lymph Nodes:       Number of Lymph Nodes Involved: 0                            Nodal Sites with Tumor: 0       Number of Lymph Nodes Examined: 11                       Nodal Sites Examined: Hilar, 4R, 9R  Distant Metastasis:       Distant Site(s) Involved: Not applicable  Pathologic Stage  Classification (pTNM, AJCC 8th Edition): pT1b, pN0

## 2020-09-19 ENCOUNTER — Other Ambulatory Visit: Payer: Self-pay

## 2020-09-19 ENCOUNTER — Ambulatory Visit (INDEPENDENT_AMBULATORY_CARE_PROVIDER_SITE_OTHER): Payer: Self-pay | Admitting: *Deleted

## 2020-09-19 DIAGNOSIS — Z4802 Encounter for removal of sutures: Secondary | ICD-10-CM

## 2020-09-19 NOTE — Progress Notes (Signed)
Patient arrived for nurse visit to remove sutures post- RATS Lobectomy 09/01/20 by Dr. Kipp Brood.  One suture removed with no signs or symptoms of infection noted.  Incision well approximated.  Patient tolerated suture removal well.  Patient and family instructed to keep the incision site clean and dry. Patient and family acknowledged instructions given.  All questions answered.

## 2020-09-24 ENCOUNTER — Ambulatory Visit: Payer: Medicare Other | Admitting: Family

## 2020-10-01 ENCOUNTER — Ambulatory Visit (INDEPENDENT_AMBULATORY_CARE_PROVIDER_SITE_OTHER): Payer: Medicare Other

## 2020-10-01 ENCOUNTER — Other Ambulatory Visit: Payer: Self-pay | Admitting: Thoracic Surgery (Cardiothoracic Vascular Surgery)

## 2020-10-01 DIAGNOSIS — R911 Solitary pulmonary nodule: Secondary | ICD-10-CM

## 2020-10-01 DIAGNOSIS — J309 Allergic rhinitis, unspecified: Secondary | ICD-10-CM

## 2020-10-03 ENCOUNTER — Ambulatory Visit
Admission: RE | Admit: 2020-10-03 | Discharge: 2020-10-03 | Disposition: A | Discharge status: Home / Self Care | Payer: Medicare Other | Source: Ambulatory Visit | Attending: Thoracic Surgery (Cardiothoracic Vascular Surgery) | Admitting: Thoracic Surgery (Cardiothoracic Vascular Surgery)

## 2020-10-03 ENCOUNTER — Encounter: Payer: Self-pay | Admitting: Oncology

## 2020-10-03 ENCOUNTER — Encounter: Payer: Self-pay | Admitting: Thoracic Surgery (Cardiothoracic Vascular Surgery)

## 2020-10-03 ENCOUNTER — Ambulatory Visit (INDEPENDENT_AMBULATORY_CARE_PROVIDER_SITE_OTHER): Payer: Self-pay | Admitting: Thoracic Surgery (Cardiothoracic Vascular Surgery)

## 2020-10-03 ENCOUNTER — Other Ambulatory Visit: Payer: Self-pay

## 2020-10-03 VITALS — BP 115/80 | HR 90 | Resp 20 | Ht 66.0 in | Wt 191.0 lb

## 2020-10-03 DIAGNOSIS — C3411 Malignant neoplasm of upper lobe, right bronchus or lung: Secondary | ICD-10-CM

## 2020-10-03 DIAGNOSIS — R911 Solitary pulmonary nodule: Secondary | ICD-10-CM

## 2020-10-03 DIAGNOSIS — Z09 Encounter for follow-up examination after completed treatment for conditions other than malignant neoplasm: Secondary | ICD-10-CM

## 2020-10-03 NOTE — Progress Notes (Signed)
      [address]       [place] [address]             [phone]        Leonard J Vilar [place] Medical Record #[id number] Date of Birth: 1950-07-11  Referring: Garner Nash, DO Primary Care: Ludwig Clarks, FNP Primary Cardiologist:None  Reason for visit:   follow-up  History of Present Illness:     70 year old female status post lobectomy for non-small cell lung cancer.  Overall she is doing well.  She continues to have some paresthesias along her anterior chest wall and abdomen.  But this is improving.  She does have a lack of energy.  She denies much shortness of breath.  Physical Exam: BP 115/80   Pulse 90   Resp 20   Ht 5\' 6"  (1.676 m)   Wt 191 lb (86.6 kg)   SpO2 97%   BMI 30.83 kg/m   Alert NAD Incision clean.  Abdomen soft, ND No peripheral edema   Diagnostic Studies & Laboratory data: CXR: Clear     Assessment / Plan:   70 year old female status post right upper lobectomy for a T1b N0 M0 adenocarcinoma the lung.  She also has a history of colon cancer and is currently being seen by her oncologist.  I will reach out to her oncologist to coordinate surveillance for both the lung and the colon.   Lajuana Matte 10/03/2020 5:01 PM

## 2020-10-06 NOTE — Patient Instructions (Addendum)
1. Perennial and seasonal allergic rhinitis (molds, cat, dog, dust mite) - Continue with the allergy shots and have access to epinephrine auto injector device. We will send in a prescription for EpiPen 0.3 mg - Continue Periactin 4mg  at night (this can cause sleepiness and increased eating... watch for it).  - Continue azelastine 2 sprays each nostril once a day as needed for drainage down throat - She declines an antibiotic at this time. Instructed her to call us if she feels like she is getting a sinus infection  2. GERD - Continue with Prilosec.  Continue to follow up with you pulmonologist  Please let us know if this treatment plan is not working well for you Schedule a follow up appointment in 6 months

## 2020-10-08 ENCOUNTER — Encounter: Payer: Self-pay | Admitting: Oncology

## 2020-10-08 ENCOUNTER — Other Ambulatory Visit: Payer: Self-pay

## 2020-10-08 ENCOUNTER — Encounter: Payer: Self-pay | Admitting: Family

## 2020-10-08 ENCOUNTER — Ambulatory Visit (INDEPENDENT_AMBULATORY_CARE_PROVIDER_SITE_OTHER): Payer: Medicare Other | Admitting: Family

## 2020-10-08 VITALS — BP 118/76 | HR 66 | Temp 97.9°F | Resp 16 | Ht 66.0 in | Wt 194.2 lb

## 2020-10-08 DIAGNOSIS — J302 Other seasonal allergic rhinitis: Secondary | ICD-10-CM

## 2020-10-08 DIAGNOSIS — K219 Gastro-esophageal reflux disease without esophagitis: Secondary | ICD-10-CM

## 2020-10-08 DIAGNOSIS — J3089 Other allergic rhinitis: Secondary | ICD-10-CM

## 2020-10-08 MED ORDER — EPINEPHRINE 0.3 MG/0.3ML IJ SOAJ: 0.3000 mg | INTRAMUSCULAR | 1 refills | Status: DC | PRN

## 2020-10-08 MED ORDER — AZELASTINE HCL 0.1 % NA SOLN: 2.0000 | Freq: Every day | NASAL | 1 refills | Status: DC

## 2020-10-08 MED ORDER — EPINEPHRINE 0.3 MG/0.3ML IJ SOAJ: 0.3000 mg | INTRAMUSCULAR | 0 refills | Status: DC | PRN

## 2020-10-08 NOTE — Progress Notes (Signed)
[place], [address] [place] [address] Dept: [phone]  FOLLOW UP NOTE  Patient ID: Jordan Mahoney, female    DOB: 23-Feb-1950  Age: 70 y.o. MRN: [id number] Date of Office Visit: 10/08/2020  Assessment  Chief Complaint: Allergic Rhinitis  and Other (18th of  July she had surgery for lung cancer - on the right they got it all )  HPI Jordan Mahoney is a 70 year old female who presents today for follow-up of perennial and seasonal allergic rhinitis and gastroesophageal reflux disease.  She was last seen on February 29, 2020 by Dr. Ernst Bowler.  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  Perennial and seasonal allergic rhinitis is reported as moderately controlled with allergy injections every 4 weeks, Periactin 4 mg at night, and azelastine 2 sprays each nostril once a day.  She does not have an EpiPen.  She feels like her allergy injections help and she denies any large local reactions.  She does not wish to stop allergy injections at this time.  She reports postnasal drip mainly at night and occasional clear rhinorrhea.  She denies any nasal congestion.  She only has sinus pressure when around perfumes or has a migraine.  She also reports that she has a productive cough with green sputum.  When asked if the green is coming from the drainage in her throat or her lungs, she is unable to differentiate.  She does not feel like she has a sinus infection at this time and does not wish to take an antibiotic.  She recently had a chest x-ray on August19th, 2022 showing, "CLINICAL DATA:  Status post right upper lobectomy.   EXAM: CHEST - 2 VIEW   COMPARISON:  Chest x-ray dated September 03, 2020.   FINDINGS: Interval right-sided chest tube removal. The heart size and mediastinal contours are within normal limits. Normal pulmonary vascularity. Postsurgical changes in the central right lung  related to prior right upper lobectomy. Associated volume loss in the right hemithorax. Unchanged small right apical pneumothorax. No consolidation or pleural effusion. No acute osseous abnormality.   IMPRESSION: 1. Unchanged small right apical pneumothorax.   She reports that her productive cough has always been green in color and even while she was on antibiotics in the hospital the color never changed.  Gastroesophageal reflux disease is reported as controlled as long as she takes her Prilosec.  She reports that when she forgets to take her medications she will wake up at night with symptoms.     Drug Allergies:  Allergies  Allergen Reactions   Sulfa Antibiotics Other (See Comments)    Causes skin reaction ( on her mid back)  Pigmentation.  A fixed drug reaction   Tape Other (See Comments)    Blister when pulled of or stays on too long   Valium [Diazepam]     Violent dreams   Latex Hives and Rash   Other Itching and Rash    Metal contact allergy Blister    Review of Systems: Review of Systems  Constitutional:  Negative for chills and fever.  HENT:         Reports postnasal drip and at times clear rhinorrhea.  Denies nasal congestion  Eyes:        Denies itchy watery eyes  Respiratory:  Positive for cough and shortness of breath. Negative for wheezing.   Cardiovascular:  Positive for  chest pain. Negative for palpitations.       Reports pain in her chest when she takes a deep breath.  Gastrointestinal:        Reports reflux only when she forgets to take her medication  Genitourinary:  Negative for dysuria.  Skin:  Negative for itching and rash.  Neurological:  Positive for headaches.       Reports history of migraines  Endo/Heme/Allergies:  Positive for environmental allergies.    Physical Exam: BP 118/76   Pulse 66   Temp 97.9 F (36.6 C)   Resp 16   Ht 5\' 6"  (1.676 m)   Wt 194 lb 3.2 oz (88.1 kg)   SpO2 99%   BMI 31.34 kg/m    Physical  Exam Constitutional:      Appearance: Normal appearance.  HENT:     Head: Normocephalic and atraumatic.     Comments: Pharynx normal, eyes normal, ears normal, nose normal    Right Ear: Tympanic membrane, ear canal and external ear normal.     Left Ear: Tympanic membrane, ear canal and external ear normal.     Nose: Nose normal.     Mouth/Throat:     Mouth: Mucous membranes are moist.     Pharynx: Oropharynx is clear.  Eyes:     Conjunctiva/sclera: Conjunctivae normal.  Cardiovascular:     Rate and Rhythm: Normal rate and regular rhythm.     Heart sounds: Normal heart sounds.  Pulmonary:     Effort: Pulmonary effort is normal.     Breath sounds: Normal breath sounds.     Comments: Lungs clear to auscultation.  No air movement heard in right upper lobe Musculoskeletal:     Cervical back: Neck supple.  Skin:    General: Skin is warm.  Neurological:     Mental Status: She is alert and oriented to person, place, and time.  Psychiatric:        Mood and Affect: Mood normal.        Behavior: Behavior normal.        Thought Content: Thought content normal.        Judgment: Judgment normal.    Diagnostics:  None  Assessment and Plan: 1. Gastroesophageal reflux disease, unspecified whether esophagitis present   2. Seasonal and perennial allergic rhinitis     No orders of the defined types were placed in this encounter.   Patient Instructions  1. Perennial and seasonal allergic rhinitis (molds, cat, dog, dust mite) - Continue with the allergy shots and have access to epinephrine auto injector device. We will send in a prescription for EpiPen 0.3 mg - Continue Periactin 4mg  at night (this can cause sleepiness and increased eating... watch for it).  - Continue azelastine 2 sprays each nostril once a day as needed for drainage down throat - She declines an antibiotic at this time. Instructed her to call us if she feels like she is getting a sinus infection  2. GERD - Continue  with Prilosec.  Continue to follow up with you pulmonologist  Please let us know if this treatment plan is not working well for you Schedule a follow up appointment in 6 months       Return in about 6 months (around 04/10/2021), or if symptoms worsen or fail to improve.    Thank you for the opportunity to care for this patient.  Please do not hesitate to contact me with questions.  Althea Charon, FNP [facility]

## 2020-10-29 DIAGNOSIS — J309 Allergic rhinitis, unspecified: Secondary | ICD-10-CM

## 2020-10-29 NOTE — Progress Notes (Signed)
Chart reviewed, agree above plan ?

## 2020-11-03 ENCOUNTER — Inpatient Hospital Stay: Payer: Medicare Other | Attending: Oncology

## 2020-11-03 DIAGNOSIS — Z902 Acquired absence of lung [part of]: Secondary | ICD-10-CM | POA: Diagnosis not present

## 2020-11-03 DIAGNOSIS — C3411 Malignant neoplasm of upper lobe, right bronchus or lung: Secondary | ICD-10-CM | POA: Insufficient documentation

## 2020-11-03 DIAGNOSIS — R918 Other nonspecific abnormal finding of lung field: Secondary | ICD-10-CM

## 2020-11-03 DIAGNOSIS — C19 Malignant neoplasm of rectosigmoid junction: Secondary | ICD-10-CM | POA: Insufficient documentation

## 2020-11-03 DIAGNOSIS — C187 Malignant neoplasm of sigmoid colon: Secondary | ICD-10-CM

## 2020-11-03 LAB — COMPREHENSIVE METABOLIC PANEL
ALT: 25 U/L (ref 0–44)
AST: 32 U/L (ref 15–41)
Albumin: 4 g/dL (ref 3.5–5.0)
Alkaline Phosphatase: 104 U/L (ref 38–126)
Anion gap: 10 (ref 5–15)
BUN: 10 mg/dL (ref 8–23)
CO2: 25 mmol/L (ref 22–32)
Calcium: 8.5 mg/dL — ABNORMAL LOW (ref 8.9–10.3)
Chloride: 105 mmol/L (ref 98–111)
Creatinine, Ser: 0.75 mg/dL (ref 0.44–1.00)
GFR, Estimated: >60 mL/min (ref >60)
Glucose, Bld: 119 mg/dL — ABNORMAL HIGH (ref 70–99)
Potassium: 3.7 mmol/L (ref 3.5–5.1)
Sodium: 140 mmol/L (ref 135–145)
Total Bilirubin: 0.8 mg/dL (ref 0.3–1.2)
Total Protein: 7.3 g/dL (ref 6.5–8.1)

## 2020-11-03 LAB — CBC WITH DIFFERENTIAL/PLATELET
Abs Immature Granulocytes: 0.01 10*3/uL (ref 0.00–0.07)
Basophils Absolute: 0.1 10*3/uL (ref 0.0–0.1)
Basophils Relative: 1 %
Eosinophils Absolute: 0.2 10*3/uL (ref 0.0–0.5)
Eosinophils Relative: 2 %
HCT: 38.5 % (ref 36.0–46.0)
Hemoglobin: 12.1 g/dL (ref 12.0–15.0)
Immature Granulocytes: 0 %
Lymphocytes Relative: 27 %
Lymphs Abs: 1.9 10*3/uL (ref 0.7–4.0)
MCH: 28.1 pg (ref 26.0–34.0)
MCHC: 31.4 g/dL (ref 30.0–36.0)
MCV: 89.3 fL (ref 80.0–100.0)
Monocytes Absolute: 0.6 10*3/uL (ref 0.1–1.0)
Monocytes Relative: 8 %
Neutro Abs: 4.6 10*3/uL (ref 1.7–7.7)
Neutrophils Relative %: 62 %
Platelets: 282 10*3/uL (ref 150–400)
RBC: 4.31 MIL/uL (ref 3.87–5.11)
RDW: 15.1 % (ref 11.5–15.5)
WBC: 7.3 10*3/uL (ref 4.0–10.5)
nRBC: 0 % (ref 0.0–0.2)

## 2020-11-04 LAB — CEA: CEA: 2.1 ng/mL (ref 0.0–4.7)

## 2020-11-05 ENCOUNTER — Ambulatory Visit (INDEPENDENT_AMBULATORY_CARE_PROVIDER_SITE_OTHER): Payer: Medicare Other | Admitting: *Deleted

## 2020-11-05 DIAGNOSIS — J309 Allergic rhinitis, unspecified: Secondary | ICD-10-CM

## 2020-11-06 ENCOUNTER — Inpatient Hospital Stay (HOSPITAL_BASED_OUTPATIENT_CLINIC_OR_DEPARTMENT_OTHER): Payer: Medicare Other | Admitting: Oncology

## 2020-11-06 ENCOUNTER — Inpatient Hospital Stay: Payer: Medicare Other | Admitting: Oncology

## 2020-11-06 VITALS — BP 118/85 | HR 68 | Temp 97.3°F | Resp 18 | Wt 192.7 lb

## 2020-11-06 DIAGNOSIS — C19 Malignant neoplasm of rectosigmoid junction: Secondary | ICD-10-CM | POA: Diagnosis not present

## 2020-11-06 DIAGNOSIS — C3491 Malignant neoplasm of unspecified part of right bronchus or lung: Secondary | ICD-10-CM | POA: Diagnosis not present

## 2020-11-06 DIAGNOSIS — C187 Malignant neoplasm of sigmoid colon: Secondary | ICD-10-CM

## 2020-11-06 NOTE — Progress Notes (Signed)
Hematology/Oncology follow up  [facility] [phone]) [phone] [phone]   Patient Care Team: Ludwig Clarks, FNP as PCP - General (Family Medicine) Dorthula Rue., MD as Referring Physician (Otolaryngology) Clent Jacks, RN as Registered Nurse  REFERRING PROVIDER: Dr.Toledo / Gerarda Gunther 'CHIEF COMPLAINTS/REASON FOR VISIT:  Follow up Stage IIA colorectal cancer  HISTORY OF PRESENTING ILLNESS:  Jordan Mahoney is a  70 y.o.  female with PMH listed below who was referred to me for evaluation of stage IIA colorectal cancer. Patient was initially seen care physician for evaluation of fatigue.  Lab work-up showed that patient's anemic.  Hemoccult was obtained and was positive.  Patient was referred to [facility] and was seen and evaluated by Dr. Alice Reichert.  Reports that she had 2-3 bowel movement daily sometimes mushy and sometimes formed.  Denies seeing any bright red blood in the stool.  Denies any associated abdominal pain, gastric discomfort.  Appetite seems to be stable.  History of gastric bypass in 2010.  Cholecystectomy in 2000.  ?Unintentional weight loss 5 to 6 pounds for the past 2 months.   prior records showed she weighs 149 pounds on 09/28/2017, weighed 160 pounds 05/03/2017 Today she weighs 153 pounds Colonoscopy was obtained on 03/27/2018.  6 mm polyp found in sigmoid colon.  Polyp is sessile. An ulcerated partially obstructing mass was found in the rectosigmoid colon.  Mass was circumferential.  Measured 5 cm in length.  No bleeding was present.  Biopsy was taken.  Nonbleeding internal hemorrhoids were found during retroflexion.  # underwent surgery.  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 No loss of nuclear expression of MMR proteins: Low probability of MSI-H.  Stage IIA, No adjuvant chemotherapy was not  Offered  INTERVAL  HISTORY Jordan Mahoney is a 69 y.o. female who has above history reviewed by me today presents for follow up visit for management of Stage IIA colorectal cancer and Stage ! Lung cancer.   Slow-growing lung nodule in the right upper lobe.  Case on 05/01/2020 tumor board.Consensus reached upon referring patient to pulmonology Dr. Patsey Berthold for biopsy via bronchoscopy.  06/27/2020, PET scan shows no distant metastasis.  Right upper lobe pulm nodule SUV 1.3.  Asymmetric palatine tonsilla activity probably physiologic.  Lack of CT correlate. 08/05/2020 right upper lobe fine-needle aspiration the bronchoscopy showed malignant cells consistent with non-small cell carcinoma.   09/01/2020, patient underwent right upper lobe lobectomy with lymph node dissection.  No LVI, pT1b pN0  Today patient reports doing very well.  Denies any shortness of breath, cough, fever, chills, abdominal pain, bowel habit changes.  No blood in the stool.  Appetite is fair.    Review of Systems  Constitutional:  Negative for appetite change, chills, fatigue and fever.  HENT:   Negative for hearing loss and voice change.   Eyes:  Negative for eye problems.  Respiratory:  Negative for chest tightness and cough.   Cardiovascular:  Negative for chest pain.  Gastrointestinal:  Negative for abdominal distention, abdominal pain and blood in stool.  Endocrine: Negative for hot flashes.  Genitourinary:  Negative for difficulty urinating and frequency.        Bladder spasm  Musculoskeletal:  Negative for arthralgias.  Skin:  Negative for itching and rash.  Neurological:  Negative for extremity weakness.  Hematological:  Negative for adenopathy.  Psychiatric/Behavioral:  Negative for confusion. The patient is not nervous/anxious.  MEDICAL HISTORY:  Past Medical History:  Diagnosis Date   Allergic rhinoconjunctivitis    Allergies    Arthritis    Back pain    Chronic uveitis    Depression    Frequent UTI    GERD  (gastroesophageal reflux disease)    HA (headache)    Hearing loss    wearing bilateral aides   History of hiatal hernia    Hypothyroidism    Lactose intolerance    Migraine    Osteopenia    Pre-diabetes    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HCC)    Sensorineural hearing loss    Tingling of both feet    Tinnitus     SURGICAL HISTORY: Past Surgical History:  Procedure Laterality Date   ABDOMINAL HYSTERECTOMY     belly button     belly button hernia   BRONCHIAL BIOPSY  08/05/2020   Procedure: BRONCHIAL BIOPSIES;  Surgeon: Garner Nash, DO;  Location: [facility];  Service: Pulmonary;;   BRONCHIAL BRUSHINGS  08/05/2020   Procedure: BRONCHIAL BRUSHINGS;  Surgeon: Garner Nash, DO;  Location: [place];  Service: Pulmonary;;   BRONCHIAL NEEDLE ASPIRATION BIOPSY  08/05/2020   Procedure: BRONCHIAL NEEDLE ASPIRATION BIOPSIES;  Surgeon: Garner Nash, DO;  Location: [place];  Service: Pulmonary;;   BRONCHIAL WASHINGS  08/05/2020   Procedure: BRONCHIAL WASHINGS;  Surgeon: Garner Nash, DO;  Location: [place];  Service: Pulmonary;;   c sections     CATARACT EXTRACTION Right    CHOLECYSTECTOMY     COLON RESECTION N/A 04/17/2018   Procedure: LAPAROSCOPIC COLON RESECTION POSSIBLE OSTOMY;  Surgeon: Benjamine Sprague, DO;  Location: [facility];  Service: General;  Laterality: N/A;   COLONOSCOPY WITH PROPOFOL N/A 07/23/2020   Procedure: COLONOSCOPY WITH PROPOFOL;  Surgeon: Toledo, Benay Pike, MD;  Location: [facility];  Service: Gastroenterology;  Laterality: N/A;   DIAGNOSTIC LAPAROSCOPY     ELBOW SURGERY Left    EYE SURGERY Right    glaucoma and stent surgery   FIDUCIAL MARKER PLACEMENT  08/05/2020   Procedure: FIDUCIAL MARKER PLACEMENT;  Surgeon: Garner Nash, DO;  Location: [facility];  Service: Pulmonary;;   GANGLION CYST EXCISION Left 01/22/2016   base of thumb   GASTRIC BYPASS     GLAUCOMA SURGERY     GLAUCOMA SURGERY  12/13/2019   right eye   HERNIA REPAIR      INTERCOSTAL NERVE BLOCK Right 09/01/2020   Procedure: INTERCOSTAL NERVE BLOCK;  Surgeon: Lajuana Matte, MD;  Location: [place];  Service: Thoracic;  Laterality: Right;   neck fusion  07/04/2017   Dr. Carloyn Manner in [place] Right 09/01/2020   Procedure: NODE DISSECTION;  Surgeon: Lajuana Matte, MD;  Location: [place];  Service: Thoracic;  Laterality: Right;   [place] Right 08/05/2020   Procedure: VIDEO BRONCHOSCOPY WITH ENDOBRONCHIAL NAVIGATION;  Surgeon: Garner Nash, DO;  Location: [place];  Service: Pulmonary;  Laterality: Right;   WRIST SURGERY Right     SOCIAL HISTORY: Social History   Socioeconomic History   Marital status: Married    Spouse name: Not on file   Number of children: 2   Years of education: college   Highest education level: Not on file  Occupational History    Comment: retired  Tobacco Use   Smoking status: Former    Packs/day: 0.50    Years: 10.00    Pack years: 5.00  Types: Cigarettes    Quit date: 68    Years since quitting: 40.7   Smokeless tobacco: Never  Vaping Use   Vaping Use: Never used  Substance and Sexual Activity   Alcohol use: Not Currently    Alcohol/week: 1.0 standard drink    Types: 1 Cans of beer per week   Drug use: No   Sexual activity: Not on file  Other Topics Concern   Not on file  Social History Narrative   Patient lives at home with her husband [name]).    Education two years of college.   Caffeine - two glasses of tea.   Social Determinants of Health   Financial Resource Strain: Not on file  Food Insecurity: Not on file  Transportation Needs: Not on file  Physical Activity: Not on file  Stress: Not on file  Social Connections: Not on file  Intimate Partner Violence: Not on file    FAMILY HISTORY: Family History  Problem Relation Age of Onset   Diabetes Father    Stroke Father    Squamous cell carcinoma Mother     Migraines Daughter    Breast cancer Paternal Aunt    Allergic rhinitis Neg Hx    Angioedema Neg Hx    Asthma Neg Hx    Eczema Neg Hx    Immunodeficiency Neg Hx    Urticaria Neg Hx     ALLERGIES:  is allergic to sulfa antibiotics, tape, valium [diazepam], latex, and other.  MEDICATIONS:  Current Outpatient Medications  Medication Sig Dispense Refill   Ascorbic Acid (VITAMIN C PO) Take 1 mg by mouth 2 (two) times daily.     azelastine (ASTELIN) 0.1 % nasal spray Place 2 sprays into both nostrils daily. 90 mL 1   CALCIUM CITRATE PO Take 1,000 mg by mouth at bedtime. Bariatric Advantage     Cholecalciferol (VITAMIN D3) 125 MCG (5000 UT) CAPS Take 5,000 Units by mouth daily.     Cyanocobalamin (VITAMIN B12) 1000 MCG TBCR Take 1,000 mcg by mouth in the morning.     cyproheptadine (PERIACTIN) 4 MG tablet Take 1 tablet (4 mg total) by mouth at bedtime.     dorzolamide-timolol (COSOPT) 22.3-6.8 MG/ML ophthalmic solution Place 1 drop into the right eye 2 (two) times daily.     eletriptan (RELPAX) 40 MG tablet Take 1 tablet (40 mg total) by mouth every 2 (two) hours as needed for migraine. 40 mg on set May repeat in 2 hours if headache persists or recurs. Not to exceed 2 tabs/24hours.     EPINEPHrine 0.3 mg/0.3 mL IJ SOAJ injection Inject 0.3 mg into the muscle as needed for anaphylaxis. 1 each 1   folic acid (FOLVITE) 1 MG tablet Take 1 mg by mouth in the morning.     Fremanezumab-vfrm (AJOVY) 225 MG/1.5ML SOAJ Inject 225 mg into the skin every 30 (thirty) days. 1.5 mL 11   gabapentin (NEURONTIN) 400 MG capsule Take 1 capsule (400 mg total) by mouth 2 (two) times daily. 180 capsule 3   Krill Oil 350 MG CAPS Take 350 mg by mouth daily.     levothyroxine (SYNTHROID) 75 MCG tablet Take 1 tablet (75 mcg total) by mouth daily before breakfast. 90 tablet 1   Magnesium Oxide 400 MG CAPS Take 400 mg by mouth in the morning and at bedtime.     meclizine (ANTIVERT) 25 MG tablet Take by mouth.      methotrexate (RHEUMATREX) 2.5 MG tablet Take 15 mg by mouth  every Monday. As directed by doctor.     Multiple Vitamin (MULTIVITAMIN) tablet Take 1 tablet by mouth 2 (two) times daily. Bariatric Advantage     omeprazole (PRILOSEC) 20 MG capsule Take 1 capsule (20 mg total) by mouth in the morning and at bedtime. 60 capsule 5   prednisoLONE acetate (PRED FORTE) 1 % ophthalmic suspension Place 1 drop into the right eye daily.     riboflavin (VITAMIN B-2) 100 MG TABS tablet Take 100 mg by mouth in the morning and at bedtime.     solifenacin (VESICARE) 10 MG tablet Take 10 mg by mouth in the morning.     tiZANidine (ZANAFLEX) 4 MG capsule Take 4 mg by mouth 3 (three) times daily as needed for muscle spasms.     traMADol (ULTRAM) 50 MG tablet Take 1 tablet (50 mg total) by mouth every 6 (six) hours as needed (mild pain). (Patient not taking: Reported on 11/06/2020) 30 tablet 0   No current facility-administered medications for this visit.     PHYSICAL EXAMINATION: ECOG PERFORMANCE STATUS: 1 - Symptomatic but completely ambulatory Vitals:   11/06/20 0941  BP: 118/85  Pulse: 68  Resp: 18  Temp: (!) 97.3 F (36.3 C)  SpO2: 98%   Filed Weights   11/06/20 0941  Weight: 192 lb 10.9 oz (87.4 kg)    Physical Exam Constitutional:      General: She is not in acute distress. HENT:     Head: Normocephalic and atraumatic.  Eyes:     General: No scleral icterus.    Pupils: Pupils are equal, round, and reactive to light.  Cardiovascular:     Rate and Rhythm: Normal rate and regular rhythm.     Heart sounds: Normal heart sounds.  Pulmonary:     Effort: Pulmonary effort is normal. No respiratory distress.     Breath sounds: No wheezing.  Abdominal:     General: Bowel sounds are normal. There is no distension.     Palpations: Abdomen is soft. There is no mass.     Tenderness: There is no abdominal tenderness.  Musculoskeletal:        General: No deformity. Normal range of motion.     Cervical  back: Normal range of motion and neck supple.  Skin:    General: Skin is warm and dry.     Findings: No erythema or rash.  Neurological:     Mental Status: She is alert and oriented to person, place, and time.     Cranial Nerves: No cranial nerve deficit.     Coordination: Coordination normal.  Psychiatric:        Behavior: Behavior normal.        Thought Content: Thought content normal.    RADIOGRAPHIC STUDIES: I have personally reviewed the radiological images as listed and agreed with the findings in the report. DG Chest 2 View  Result Date: 10/03/2020 CLINICAL DATA:  Status post right upper lobectomy. EXAM: CHEST - 2 VIEW COMPARISON:  Chest x-ray dated September 03, 2020. FINDINGS: Interval right-sided chest tube removal. The heart size and mediastinal contours are within normal limits. Normal pulmonary vascularity. Postsurgical changes in the central right lung related to prior right upper lobectomy. Associated volume loss in the right hemithorax. Unchanged small right apical pneumothorax. No consolidation or pleural effusion. No acute osseous abnormality. IMPRESSION: 1. Unchanged small right apical pneumothorax. Electronically Signed   By: Titus Dubin M.D.   On: 10/03/2020 09:16   DG Chest 2 View  Result Date: 09/01/2020 CLINICAL DATA:  Preoperative respiratory evaluation. EXAM: CHEST - 2 VIEW COMPARISON:  08/05/2020.  CT scan 07/29/2020 FINDINGS: Right lung nodule characterized on previous CT is not well seen on x-ray. Fiducial markers noted right mid lung. No focal airspace consolidation or pulmonary edema. No pneumothorax or pleural effusion. The cardiopericardial silhouette is within normal limits for size. IMPRESSION: No acute cardiopulmonary findings. Electronically Signed   By: Misty Stanley M.D.   On: 09/01/2020 05:58   DG CHEST PORT 1 VIEW  Result Date: 09/03/2020 CLINICAL DATA:  Chest tube. EXAM: PORTABLE CHEST 1 VIEW COMPARISON:  09/02/2020. FINDINGS: Right IJ line and right  chest tube in stable position. Right pneumothorax again noted with slight improvement from prior exam. Postsurgical changes right lung with persistent right perihilar atelectasis/infiltrate. Mild left base subsegmental atelectasis. No pleural effusion. Stable cardiomegaly. Prior cervical spine fusion. IMPRESSION: 1. Right IJ line and right chest tube in stable position. Right pneumothorax again noted with slight improvement from prior exam. 2. Postsurgical changes right lung with persistent right perihilar atelectasis/infiltrate. Mild left base subsegmental atelectasis. 3.  Stable cardiomegaly. Electronically Signed   By: Marcello Moores  Register   On: 09/03/2020 07:17   DG CHEST PORT 1 VIEW  Result Date: 09/02/2020 CLINICAL DATA:  Prior lobectomy.  Chest tube. EXAM: PORTABLE CHEST 1 VIEW COMPARISON:  09/01/2020. FINDINGS: Right IJ line and right chest tube in stable position. Stable moderate right-sided pneumothorax. Postsurgical changes right lung. Progressive right perihilar atelectasis/infiltrate noted on today's exam. Mild left base subsegmental atelectasis. Cardiomegaly. No pulmonary venous congestion. Prior cervical spine fusion. IMPRESSION: 1. Right IJ line right chest tube in stable position. Stable moderate right-sided pneumothorax. 2. Postsurgical changes right lung. Progressive right perihilar atelectasis/infiltrate noted on today's exam. Mild left base subsegmental atelectasis. 3.  Cardiomegaly.  No pulmonary venous congestion. Electronically Signed   By: Marcello Moores  Register   On: 09/02/2020 06:57   DG Chest Port 1 View  Result Date: 09/01/2020 CLINICAL DATA:  Right chest tube in place after right upper lobectomy today. EXAM: PORTABLE CHEST 1 VIEW COMPARISON:  PA and lateral chest 07/30/2020. FINDINGS: Right chest tube is in place with its tip in the lung apex. There is a right pneumothorax estimated at 25%. Right IJ catheter tip projects in the lower superior vena cava. Suture material is seen in the  right hilum. Fiducial markers on the prior study have been removed. The left lung is expanded and clear. Heart size is normal. No pleural fluid. No acute bony abnormality. IMPRESSION: Status post right upper lobectomy today. The patient has a right pneumothorax estimated at 25% with a right chest tube in place. Right IJ catheter tip projects in the lower superior vena cava. Electronically Signed   By: Inge Rise M.D.   On: 09/01/2020 14:04   DG Chest Port 1V same Day  Result Date: 09/03/2020 CLINICAL DATA:  Clamped right-sided chest tube, history of pneumothorax EXAM: PORTABLE CHEST 1 VIEW COMPARISON:  09/03/2020 at 5:44 a.m. FINDINGS: Single frontal view of the chest demonstrates stable position of the right chest tube. The small right apical pneumothorax seen on the previous study is unchanged in size. No airspace disease or effusion. The cardiac silhouette is unremarkable. Stable postsurgical changes at the right hilum. IMPRESSION: 1. Stable small right apical pneumothorax. 2. Stable postsurgical changes of the right ilium Electronically Signed   By: Randa Ngo M.D.   On: 09/03/2020 15:13       LABORATORY DATA:  I have reviewed the  data as listed Lab Results  Component Value Date   WBC 7.3 11/03/2020   HGB 12.1 11/03/2020   HCT 38.5 11/03/2020   MCV 89.3 11/03/2020   PLT 282 11/03/2020   Recent Labs    08/29/20 1130 09/02/20 0208 09/03/20 0500 11/03/20 1026  NA 136 139 141 140  K 3.8 3.9 3.9 3.7  CL 104 112* 113* 105  CO2 21* _0 GLUCOSE 125* 161* 94 119*  BUN _1 CREATININE 0.70 0.64 0.66 0.75  CALCIUM 9.2 8.2* 8.2* 8.5*  GFRNONAA >60 >60 >60 >60  PROT 6.5  --  5.1* 7.3  ALBUMIN 3.6  --  2.8* 4.0  AST 32  --  32 32  ALT 27  --  23 25  ALKPHOS 108  --  78 104  BILITOT 1.0  --  0.9 0.8    Iron/TIBC/Ferritin/ %Sat    Component Value Date/Time   IRON 83 07/31/2018 0947   TIBC 411 07/31/2018 0947   FERRITIN 16 07/31/2018 0947   IRONPCTSAT 20  07/31/2018 0947     Preop CEA was obtained on 04/03/2018, at 1.8  ASSESSMENT & PLAN:  1. Adenocarcinoma of sigmoid colon ([place])   2. Primary lung adenocarcinoma, right [facility])   Cancer Staging Adenocarcinoma of sigmoid colon ([place]) Staging form: Colon and Rectum, AJCC 8th Edition - Clinical: No stage assigned - Unsigned - Pathologic stage from 05/04/2018: Stage IIA (pT3, pN0, cM0) - Signed by Earlie Server, MD on 05/04/2018  # Stage IIA sigmoid colon  Clinically she is doing very well.  CEA stable. Continue annual CT surveillance.  #Stage I right lung adenocarcinoma, status post lobectomy. Recommend every 6 month CT surveillance. Obtain CT chest abdomen pelvis in January 2022.  Follow-up lab MD CBC CMP CEA after CT scan. Orders Placed This Encounter  Procedures   CT CHEST ABDOMEN PELVIS W CONTRAST    Standing Status:   Future    Standing Expiration Date:   11/06/2021    Order Specific Question:   Preferred imaging location?    Answer:   [facility]    Order Specific Question:   Is Oral Contrast requested for this exam?    Answer:   Yes, Per Radiology protocol    Order Specific Question:   Reason for Exam (SYMPTOM  OR DIAGNOSIS REQUIRED)    Answer:   cancer follow up   CBC with Differential/Platelet    Standing Status:   Future    Standing Expiration Date:   11/06/2021   Comprehensive metabolic panel    Standing Status:   Future    Standing Expiration Date:   11/06/2021   CEA    Standing Status:   Future    Standing Expiration Date:   11/06/2021    We spent sufficient time to discuss many aspect of care, questions were answered to patient's satisfaction.  Earlie Server, MD, PhD  11/06/2020

## 2020-11-10 ENCOUNTER — Other Ambulatory Visit: Payer: Self-pay | Admitting: "Endocrinology

## 2020-11-13 LAB — TSH: TSH: 1.63 u[IU]/mL (ref 0.450–4.500)

## 2020-11-13 LAB — T4, FREE: Free T4: 1.1 ng/dL (ref 0.82–1.77)

## 2020-11-20 ENCOUNTER — Other Ambulatory Visit: Payer: Self-pay

## 2020-11-20 ENCOUNTER — Encounter: Payer: Self-pay | Admitting: "Endocrinology

## 2020-11-20 ENCOUNTER — Ambulatory Visit (INDEPENDENT_AMBULATORY_CARE_PROVIDER_SITE_OTHER): Payer: Medicare Other | Admitting: "Endocrinology

## 2020-11-20 VITALS — BP 112/76 | HR 72 | Ht 66.0 in | Wt 197.4 lb

## 2020-11-20 DIAGNOSIS — E063 Autoimmune thyroiditis: Secondary | ICD-10-CM

## 2020-11-20 DIAGNOSIS — E038 Other specified hypothyroidism: Secondary | ICD-10-CM | POA: Diagnosis not present

## 2020-11-20 DIAGNOSIS — E119 Type 2 diabetes mellitus without complications: Secondary | ICD-10-CM

## 2020-11-20 LAB — POCT GLYCOSYLATED HEMOGLOBIN (HGB A1C): HbA1c, POC (controlled diabetic range): 6.6 % (ref 0.0–7.0)

## 2020-11-20 MED ORDER — LEVOTHYROXINE SODIUM 75 MCG PO TABS: 75.0000 ug | ORAL_TABLET | Freq: Every day | ORAL | 1 refills | Status: DC

## 2020-11-20 NOTE — Patient Instructions (Signed)
                                     Advice for Weight Management  -For most of us the best way to lose weight is by diet management. Generally speaking, diet management means consuming less calories intentionally which over time brings about progressive weight loss.  This can be achieved more effectively by restricting carbohydrate consumption to the minimum possible.  So, it is critically important to know your numbers: how much calorie you are consuming and how much calorie you need. More importantly, our carbohydrates sources should be unprocessed or minimally processed complex starch food items.   Sometimes, it is important to balance nutrition by increasing protein intake (animal or plant source), fruits, and vegetables.  - Whole Food, Plant Predominant Nutrition is highly recommended: Eat Plenty of vegetables, Mushrooms, fruits, Legumes, Whole Grains, Nuts, seeds in lieu of processed meats, processed snacks/pastries red meat, poultry, eggs.  -Sticking to a routine mealtime to eat 3 meals a day and avoiding unnecessary snacks is shown to have a big role in weight control. Under normal circumstances, the only time we lose real weight is when we are hungry, so allow hunger to take place- hunger means no food between meal times, only water.  It is not advisable to starve.   -It is better to avoid simple carbohydrates including: Cakes, Sweet Desserts, Ice Cream, Soda (diet and regular), Sweet Tea, Candies, Chips, Cookies, Store Bought Juices, Alcohol in Excess of  1-2 drinks a day, Lemonade,  Artificial Sweeteners, Doughnuts, Coffee Creamers, "Sugar-free" Products, etc, etc.  This is not a complete list.....    -Consulting with certified diabetes educators is proven to provide you with the most accurate and current information on diet.  Also, you may be  interested in discussing diet options/exchanges , we can schedule a visit with Jordan Mahoney, RDN, CDE for  individualized nutrition education.  -Exercise: If you are able: 30 -60 minutes a day ,4 days a week, or 150 minutes a week.  The longer the better.  Combine stretch, strength, and aerobic activities.  If you were told in the past that you have high risk for cardiovascular diseases, you may seek evaluation by your heart doctor prior to initiating moderate to intense exercise programs.                                  Additional Care Considerations for Diabetes   -Diabetes  is a chronic disease.  The most important care consideration is regular follow-up with your diabetes care provider with the goal being avoiding or delaying its complications and to take advantage of advances in medications and technology.    - Whole Food, Plant Predominant Nutrition is highly recommended: Eat Plenty of vegetables, Mushrooms, fruits, Legumes, Whole Grains, Nuts, seeds in lieu of processed meats, processed snacks/pastries red meat, poultry, eggs.  -Type 2 diabetes is known to coexist with other important comorbidities such as high blood pressure and high cholesterol.  It is critical to control not only the diabetes but also the high blood pressure and high cholesterol to minimize and delay the risk of complications including coronary artery disease, stroke, amputations, blindness, etc.    - Studies showed that people with diabetes will benefit from a class of medications known as ACE inhibitors and statins.  Unless   there are specific reasons not to be on these medications, the standard of care is to consider getting one from these groups of medications at an optimal doses.  These medications are generally considered safe and proven to help protect the heart and the kidneys.    - People with diabetes are encouraged to initiate and maintain regular follow-up with eye doctors, foot doctors, dentists , and if necessary heart and kidney doctors.     - It is highly recommended that people with diabetes quit smoking or  stay away from smoking, and get yearly  flu vaccine and pneumonia vaccine at least every 5 years.  One other important lifestyle recommendation is to ensure adequate sleep - at least 6-7 hours of uninterrupted sleep at night.  -Exercise: If you are able: 30 -60 minutes a day, 4 days a week, or 150 minutes a week.  The longer the better.  Combine stretch, strength, and aerobic activities.  If you were told in the past that you have high risk for cardiovascular diseases, you may seek evaluation by your heart doctor prior to initiating moderate to intense exercise programs.         

## 2020-11-20 NOTE — Progress Notes (Signed)
11/20/2020, 11:48 AM   Jordan Mahoney is a 70 y.o.-year-old female patient returning for follow-up after she was seen in consultation for hypothyroidism.    PCP:  Ludwig Clarks, FNP.   Past Medical History:  Diagnosis Date   Allergic rhinoconjunctivitis    Allergies    Arthritis    Back pain    Chronic uveitis    Depression    Frequent UTI    GERD (gastroesophageal reflux disease)    HA (headache)    Hearing loss    wearing bilateral aides   History of hiatal hernia    Hypothyroidism    Lactose intolerance    Migraine    Osteopenia    Pre-diabetes    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HCC)    Sensorineural hearing loss    Tingling of both feet    Tinnitus     Past Surgical History:  Procedure Laterality Date   ABDOMINAL HYSTERECTOMY     belly button     belly button hernia   BRONCHIAL BIOPSY  08/05/2020   Procedure: BRONCHIAL BIOPSIES;  Surgeon: Garner Nash, DO;  Location: [facility];  Service: Pulmonary;;   BRONCHIAL BRUSHINGS  08/05/2020   Procedure: BRONCHIAL BRUSHINGS;  Surgeon: Garner Nash, DO;  Location: [place];  Service: Pulmonary;;   BRONCHIAL NEEDLE ASPIRATION BIOPSY  08/05/2020   Procedure: BRONCHIAL NEEDLE ASPIRATION BIOPSIES;  Surgeon: Garner Nash, DO;  Location: [place];  Service: Pulmonary;;   BRONCHIAL WASHINGS  08/05/2020   Procedure: BRONCHIAL WASHINGS;  Surgeon: Garner Nash, DO;  Location: [place];  Service: Pulmonary;;   c sections     CATARACT EXTRACTION Right    CHOLECYSTECTOMY     COLON RESECTION N/A 04/17/2018   Procedure: LAPAROSCOPIC COLON RESECTION POSSIBLE OSTOMY;  Surgeon: Benjamine Sprague, DO;  Location: [facility];  Service: General;  Laterality: N/A;   COLONOSCOPY WITH PROPOFOL N/A 07/23/2020   Procedure: COLONOSCOPY WITH PROPOFOL;  Surgeon: Toledo, Benay Pike, MD;  Location: [facility];   Service: Gastroenterology;  Laterality: N/A;   DIAGNOSTIC LAPAROSCOPY     ELBOW SURGERY Left    EYE SURGERY Right    glaucoma and stent surgery   FIDUCIAL MARKER PLACEMENT  08/05/2020   Procedure: FIDUCIAL MARKER PLACEMENT;  Surgeon: Garner Nash, DO;  Location: [facility];  Service: Pulmonary;;   GANGLION CYST EXCISION Left 01/22/2016   base of thumb   GASTRIC BYPASS     GLAUCOMA SURGERY     GLAUCOMA SURGERY  12/13/2019   right eye   HERNIA REPAIR     INTERCOSTAL NERVE BLOCK Right 09/01/2020   Procedure: INTERCOSTAL NERVE BLOCK;  Surgeon: Lajuana Matte, MD;  Location: [place];  Service: Thoracic;  Laterality: Right;   LUNG REMOVAL, PARTIAL     R upper lobe  neck fusion  07/04/2017   Dr. Carloyn Manner in [place] Right 09/01/2020   Procedure: NODE DISSECTION;  Surgeon: Lajuana Matte, MD;  Location: [place];  Service: Thoracic;  Laterality: Right;   [place] Right 08/05/2020   Procedure: VIDEO BRONCHOSCOPY WITH ENDOBRONCHIAL NAVIGATION;  Surgeon: Garner Nash, DO;  Location: [place];  Service: Pulmonary;  Laterality: Right;   WRIST SURGERY Right     Social History   Socioeconomic History   Marital status: Married    Spouse name: Not on file   Number of children: 2   Years of education: college   Highest education level: Not on file  Occupational History    Comment: retired  Tobacco Use   Smoking status: Former    Packs/day: 0.50    Years: 10.00    Pack years: 5.00    Types: Cigarettes    Quit date: 1982    Years since quitting: 40.7   Smokeless tobacco: Never  Vaping Use   Vaping Use: Never used  Substance and Sexual Activity   Alcohol use: Not Currently    Alcohol/week: 1.0 standard drink    Types: 1 Cans of beer per week   Drug use: No   Sexual activity: Not on file  Other Topics Concern   Not on file  Social History Narrative   Patient lives at home with her husband  [name]).    Education two years of college.   Caffeine - two glasses of tea.   Social Determinants of Health   Financial Resource Strain: Not on file  Food Insecurity: Not on file  Transportation Needs: Not on file  Physical Activity: Not on file  Stress: Not on file  Social Connections: Not on file    Family History  Problem Relation Age of Onset   Diabetes Father    Stroke Father    Squamous cell carcinoma Mother    Migraines Daughter    Breast cancer Paternal Aunt    Allergic rhinitis Neg Hx    Angioedema Neg Hx    Asthma Neg Hx    Eczema Neg Hx    Immunodeficiency Neg Hx    Urticaria Neg Hx     Outpatient Encounter Medications as of 11/20/2020  Medication Sig   Ascorbic Acid (VITAMIN C PO) Take 1 mg by mouth 2 (two) times daily.   azelastine (ASTELIN) 0.1 % nasal spray Place 2 sprays into both nostrils daily.   CALCIUM CITRATE PO Take 1,000 mg by mouth at bedtime. Bariatric Advantage   Cholecalciferol (VITAMIN D3) 125 MCG (5000 UT) CAPS Take 5,000 Units by mouth daily.   Cyanocobalamin (VITAMIN B12) 1000 MCG TBCR Take 1,000 mcg by mouth in the morning.   cyproheptadine (PERIACTIN) 4 MG tablet Take 1 tablet (4 mg total) by mouth at bedtime.   dorzolamide-timolol (COSOPT) 22.3-6.8 MG/ML ophthalmic solution Place 1 drop into the right eye 2 (two) times daily.   eletriptan (RELPAX) 40 MG tablet Take 1 tablet (40 mg total) by mouth every 2 (two) hours as needed for migraine. 40 mg on set May repeat in 2 hours if headache persists or recurs. Not to exceed 2 tabs/24hours.   EPINEPHrine 0.3 mg/0.3 mL IJ SOAJ injection Inject 0.3 mg into the muscle as needed for anaphylaxis.   folic acid (FOLVITE) 1 MG tablet Take 1 mg by mouth in the morning.   Fremanezumab-vfrm (AJOVY) 225 MG/1.5ML SOAJ Inject 225 mg  into the skin every 30 (thirty) days.   gabapentin (NEURONTIN) 400 MG capsule Take 1 capsule (400 mg total) by mouth 2 (two) times daily.   Krill Oil 350 MG CAPS Take 350 mg by  mouth daily.   levothyroxine (SYNTHROID) 75 MCG tablet Take 1 tablet (75 mcg total) by mouth daily before breakfast.   Magnesium Oxide 400 MG CAPS Take 400 mg by mouth in the morning and at bedtime.   meclizine (ANTIVERT) 25 MG tablet Take by mouth.   methotrexate (RHEUMATREX) 2.5 MG tablet Take 15 mg by mouth every Monday. As directed by doctor.   Multiple Vitamin (MULTIVITAMIN) tablet Take 1 tablet by mouth 2 (two) times daily. Bariatric Advantage   omeprazole (PRILOSEC) 20 MG capsule Take 1 capsule (20 mg total) by mouth in the morning and at bedtime.   prednisoLONE acetate (PRED FORTE) 1 % ophthalmic suspension Place 1 drop into the right eye daily.   riboflavin (VITAMIN B-2) 100 MG TABS tablet Take 100 mg by mouth in the morning and at bedtime.   solifenacin (VESICARE) 10 MG tablet Take 10 mg by mouth in the morning.   tiZANidine (ZANAFLEX) 4 MG capsule Take 4 mg by mouth 3 (three) times daily as needed for muscle spasms.   [DISCONTINUED] levothyroxine (SYNTHROID) 75 MCG tablet TAKE 1 TABLET DAILY BEFORE BREAKFAST   [DISCONTINUED] traMADol (ULTRAM) 50 MG tablet Take 1 tablet (50 mg total) by mouth every 6 (six) hours as needed (mild pain). (Patient not taking: Reported on 11/06/2020)   No facility-administered encounter medications on file as of 11/20/2020.    ALLERGIES: Allergies  Allergen Reactions   Sulfa Antibiotics Other (See Comments)    Causes skin reaction ( on her mid back)  Pigmentation.  A fixed drug reaction   Tape Other (See Comments)    Blister when pulled of or stays on too long   Valium [Diazepam]     Violent dreams   Latex Hives and Rash   Other Itching and Rash    Metal contact allergy Blister   VACCINATION STATUS: Immunization History  Administered Date(s) Administered   Fluad Quad(high Dose 65+) 10/30/2018   Influenza Whole 12/09/2000, 12/11/2001, 03/20/2003   Influenza, High Dose Seasonal PF 12/17/2017   Influenza, Seasonal, Injecte, Preservative Fre  12/02/2009, 11/30/2010, 12/07/2011   Influenza,inj,Quad PF,6+ Mos 12/20/2006   Influenza,inj,quad, With Preservative 11/16/2015   Influenza-Unspecified 12/10/2005, 12/20/2006   Pneumococcal Conjugate-13 12/24/2017, 10/30/2018   Td 07/03/2003   Tdap 01/19/2011   Zoster Recombinat (Shingrix) 12/24/2017, 08/01/2018     HPI    Jordan Mahoney  is a patient with the above medical history.  During her last visit, she was diagnosed with hypothyroidism and was initiated on levothyroxine.  She is currently on levothyroxine 75 mcg p.o. daily before breakfast.  She reports compliance and consistency with her medication.  Her previsit thyroid function tests are consistent with appropriate replacement.      -She reports improvement in her previous symptoms including  fatigue, cold intolerance.  She has other medical problems including iritis, dry skin, dry brittle nails.  She has family history of thyroid dysfunction in her mother and in her sibling.  She has no family history of thyroid malignancy.   Patient denies dysphagia, shortness of breath, nor voice change.  No history of  radiation therapy to head or neck. She also is on follow-up for prediabetes, however, point-of-care A1c today 6.6% indicating neurocompressive type 2 diabetes.  She admits to have been not consistent  her diet and exercise routines.  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    ROS:  Limited as above.   Physical Exam: BP 112/76   Pulse 72   Ht 5\' 6"  (1.676 m)   Wt 197 lb 6.4 oz (89.5 kg)   BMI 31.86 kg/m  Wt Readings from Last 3 Encounters:  11/20/20 197 lb 6.4 oz (89.5 kg)  11/06/20 192 lb 10.9 oz (87.4 kg)  10/08/20 194 lb 3.2 oz (88.1 kg)    Constitutional:  Body mass index is 31.86 kg/m., not in acute distress, normal state of mind Eyes: PERRLA, EOMI, no exophthalmos ENT: moist mucous membranes, no  thyromegaly, no cervical lymphadenopathy   CMP ( most recent) CMP     Component Value Date/Time   NA 140 11/03/2020 1026   K 3.7 11/03/2020 1026   CL 105 11/03/2020 1026   CO2 25 11/03/2020 1026   GLUCOSE 119 (H) 11/03/2020 1026   BUN 10 11/03/2020 1026   CREATININE 0.75 11/03/2020 1026   CALCIUM 8.5 (L) 11/03/2020 1026   PROT 7.3 11/03/2020 1026   PROT 6.8 11/15/2019 1048   ALBUMIN 4.0 11/03/2020 1026   AST 32 11/03/2020 1026   ALT 25 11/03/2020 1026   ALKPHOS 104 11/03/2020 1026   BILITOT 0.8 11/03/2020 1026   GFRNONAA >60 11/03/2020 1026   GFRAA >60 10/29/2019 1238     Diabetic Labs (most recent): Lab Results  Component Value Date   HGBA1C 6.6 11/20/2020   HGBA1C 6.1 05/20/2020   HGBA1C 6.0 (H) 11/15/2019     Lipid Panel ( most recent) Lipid Panel  No results found for: CHOL, TRIG, HDL, CHOLHDL, VLDL, LDLCALC, LDLDIRECT, LABVLDL     Lab Results  Component Value Date   TSH 1.630 11/12/2020   TSH 0.356 (L) 05/13/2020   TSH 1.910 02/12/2020   TSH 5.940 (H) 11/15/2019   FREET4 1.10 11/12/2020   FREET4 1.30 05/13/2020   FREET4 1.09 02/12/2020       ASSESSMENT: 1. Hypothyroidism 2.  Type 2 diabetes-newly diagnosed  PLAN:  Her previsit thyroid function tests are still consistent with appropriate replacement.  She is advised to continue levothyroxine 75 mcg p.o. daily before breakfast.     - We discussed about the correct intake of her thyroid hormone, on empty stomach at fasting, with water, separated by at least 30 minutes from breakfast and other medications,  and separated by more than 4 hours from calcium, iron, multivitamins, acid reflux medications (PPIs). -Patient is made aware of the fact that thyroid hormone replacement is needed for life, dose to be adjusted by periodic monitoring of thyroid function tests.  Her labs also confirm Hashimoto's thyroiditis as a etiology for hypothyroidism.   -Due to absence of clinical goiter, no need for thyroid  ultrasound. -Her point-of-care A1c is now 6.6%, consistent with newly diagnosed type 2 diabetes.  She will not be initiated on metformin for now.    I had a long discussion about lifestyle change with her and she is willing to engage.   - she acknowledges that there is a room for improvement in her food and drink choices. - Suggestion is made for her to avoid simple carbohydrates  from her diet including Cakes, Sweet Desserts, Ice Cream, Soda (diet and regular), Sweet Tea, Candies, Chips, Cookies, Store Bought Juices, Alcohol in Excess of  1-2 drinks a day, Artificial Sweeteners,  Coffee Creamer, and "Sugar-free"  Products, Lemonade. This will help patient to have more stable blood glucose profile and potentially avoid unintended weight gain.   She is advised to maintain close follow-up with her PCP.    I spent 31 minutes in the care of the patient today including review of labs from Thyroid Function, CMP, and other relevant labs ; imaging/biopsy records (current and previous including abstractions from other facilities); face-to-face time discussing  her lab results and symptoms, medications doses, her options of short and long term treatment based on the latest standards of care / guidelines;   and documenting the encounter.  Andee Poles Tesler  participated in the discussions, expressed understanding, and voiced agreement with the above plans.  All questions were answered to her satisfaction. she is encouraged to contact clinic should she have any questions or concerns prior to her return visit.   Return in about 6 months (around 05/21/2021) for F/U with Pre-visit Labs, A1c -NV.  Glade Lloyd, MD [facility] [address] [place], [place] [address] Phone: [phone]  Fax: [phone]   11/20/2020, 11:48 AM  This note was partially dictated with voice recognition software. Similar sounding words can be transcribed inadequately or may not   be corrected upon review.

## 2020-11-26 ENCOUNTER — Ambulatory Visit (INDEPENDENT_AMBULATORY_CARE_PROVIDER_SITE_OTHER): Payer: Medicare Other

## 2020-11-26 DIAGNOSIS — J309 Allergic rhinitis, unspecified: Secondary | ICD-10-CM

## 2020-12-24 ENCOUNTER — Ambulatory Visit (INDEPENDENT_AMBULATORY_CARE_PROVIDER_SITE_OTHER): Payer: Medicare Other

## 2020-12-24 DIAGNOSIS — J309 Allergic rhinitis, unspecified: Secondary | ICD-10-CM

## 2021-01-05 ENCOUNTER — Other Ambulatory Visit: Payer: Self-pay | Admitting: Allergy & Immunology

## 2021-01-21 ENCOUNTER — Ambulatory Visit (INDEPENDENT_AMBULATORY_CARE_PROVIDER_SITE_OTHER): Payer: Medicare Other

## 2021-01-21 DIAGNOSIS — J309 Allergic rhinitis, unspecified: Secondary | ICD-10-CM

## 2021-01-29 ENCOUNTER — Telehealth: Payer: Self-pay | Admitting: Neurology

## 2021-01-29 DIAGNOSIS — G43019 Migraine without aura, intractable, without status migrainosus: Secondary | ICD-10-CM

## 2021-01-29 MED ORDER — AJOVY 225 MG/1.5ML ~~LOC~~ SOAJ: 225.0000 mg | SUBCUTANEOUS | 0 refills | Status: DC

## 2021-01-29 MED ORDER — AJOVY 225 MG/1.5ML ~~LOC~~ SOAJ: 225.0000 mg | SUBCUTANEOUS | 1 refills | Status: DC

## 2021-01-29 NOTE — Telephone Encounter (Signed)
Pt does not have enough refills on her Fremanezumab-vfrm (AJOVY) 225 MG/1.5ML SOAJ to [facility]  to last her until her next appointment with Judson Roch, NP

## 2021-01-29 NOTE — Addendum Note (Signed)
Addended by: Noberto Retort C on: 01/29/2021 10:08 AM   Modules accepted: Orders

## 2021-01-29 NOTE — Telephone Encounter (Signed)
The patient is aware the refills have been sent.

## 2021-01-29 NOTE — Addendum Note (Signed)
Addended by: Noberto Retort C on: 01/29/2021 10:23 AM   Modules accepted: Orders

## 2021-01-29 NOTE — Telephone Encounter (Signed)
Pending appt 06/23/21. Refills sent to pharmacy.

## 2021-02-20 ENCOUNTER — Ambulatory Visit (INDEPENDENT_AMBULATORY_CARE_PROVIDER_SITE_OTHER): Payer: Medicare Other

## 2021-02-20 DIAGNOSIS — J309 Allergic rhinitis, unspecified: Secondary | ICD-10-CM | POA: Diagnosis not present

## 2021-02-25 DIAGNOSIS — J3081 Allergic rhinitis due to animal (cat) (dog) hair and dander: Secondary | ICD-10-CM | POA: Diagnosis not present

## 2021-02-25 NOTE — Progress Notes (Signed)
VIALS MADE. EXP 02-25-22 °

## 2021-02-26 ENCOUNTER — Other Ambulatory Visit: Payer: Self-pay

## 2021-02-26 ENCOUNTER — Ambulatory Visit (INDEPENDENT_AMBULATORY_CARE_PROVIDER_SITE_OTHER): Payer: Medicare Other | Admitting: Pulmonary Disease

## 2021-02-26 ENCOUNTER — Encounter: Payer: Self-pay | Admitting: Pulmonary Disease

## 2021-02-26 VITALS — BP 116/78 | HR 85 | Temp 97.9°F | Ht 66.0 in | Wt 201.4 lb

## 2021-02-26 DIAGNOSIS — Z85038 Personal history of other malignant neoplasm of large intestine: Secondary | ICD-10-CM | POA: Diagnosis not present

## 2021-02-26 DIAGNOSIS — C3491 Malignant neoplasm of unspecified part of right bronchus or lung: Secondary | ICD-10-CM

## 2021-02-26 DIAGNOSIS — Z902 Acquired absence of lung [part of]: Secondary | ICD-10-CM | POA: Diagnosis not present

## 2021-02-26 DIAGNOSIS — R911 Solitary pulmonary nodule: Secondary | ICD-10-CM

## 2021-02-26 DIAGNOSIS — Z87891 Personal history of nicotine dependence: Secondary | ICD-10-CM

## 2021-02-26 DIAGNOSIS — J3089 Other allergic rhinitis: Secondary | ICD-10-CM | POA: Diagnosis not present

## 2021-02-26 NOTE — Patient Instructions (Signed)
Thank you for visiting Dr. Valeta Harms at [facility]. Today we recommend the following:  See [place] in one year or as needed Continued follow up with oncology   Return in about 1 year (around 02/26/2022), or if symptoms worsen or fail to improve.    Please do your part to reduce the spread of COVID-19.

## 2021-02-26 NOTE — Progress Notes (Signed)
Synopsis: Referred in May 2022 for lung nodule by Ludwig Clarks, FNP  Subjective:   PATIENT ID: Jordan Mahoney GENDER: female DOB: 1950/03/28, MRN: [id number]  Chief Complaint  Patient presents with   Follow-up    Patient is here for follow up on lung cancer surgery. Patient says she is still dealing with vertigo.    PMH of colon ca, stage IIa s/p resection at [facility], no chemo, ct scans at that time found the lung nodule. Has had subq follow up. Former smoker, quit in 1983. Also has thyroid disease. No family history of lung cancer.  Initial CT imaging revealed a small subcentimeter lesion in the right lung in 2020.  This has been followed.  On yearly CT imaging has showed growth.  Her most recent CT revealed an approximate 8 mm right upper lobe nodule that has lobulated margins.  Today in the office we reviewed her CT imaging from 2020 until now.  She has no significant respiratory complaints she quit smoking several years ago.  OV 08/25/2020: Patient was here today for follow-up after bronchoscopy.  Initially had consultation with Dr. Kipp Brood but due to her history of colon cancer would prefer to have tissue diagnosis prior to consider resection.  Patient was taken for video bronchoscopy with endobronchial navigation.  Nodule actually turned out to be a primary lung cancer.  Patient has had a return visit with Dr. Kipp Brood and plans for robotic resection on 09/01/2020.  Patient is anxious about this upcoming procedure but thankful that we have a diagnosis and are able to move forward.  OV 02/26/2021: Here today for follow-up after bronchoscopy and resection.  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  Followed by medical oncology now has surveillance imaging already set up.  From respiratory standpoint doing well.  She has gained some weight but she said it is being related to more sedentary after the surgery and dealing with vertigo.   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[place])   05/04/2018 Cancer Staging   Staging form: Colon and Rectum, AJCC 8th Edition - Pathologic stage from 05/04/2018: Stage IIA (pT3, pN0, cM0) - Signed by Earlie Server, MD on 05/04/2018      Past Medical History:  Diagnosis Date   Allergic rhinoconjunctivitis    Allergies    Arthritis    Back pain    Chronic uveitis    Depression    Frequent UTI    GERD (gastroesophageal reflux disease)    HA (headache)    Hearing loss    wearing bilateral aides   History of hiatal hernia    Hypothyroidism    Lactose intolerance    Migraine    Osteopenia    Pre-diabetes    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[place])    Sensorineural hearing loss    Tingling of both feet    Tinnitus      Family History  Problem Relation Age of Onset   Diabetes Father    Stroke Father    Squamous cell carcinoma Mother    Migraines Daughter    Breast cancer Paternal Aunt    Allergic rhinitis Neg Hx    Angioedema Neg Hx    Asthma Neg Hx    Eczema Neg Hx    Immunodeficiency Neg Hx    Urticaria Neg Hx      Past Surgical History:  Procedure Laterality Date  ABDOMINAL HYSTERECTOMY     belly button     belly button hernia   BRONCHIAL BIOPSY  08/05/2020   Procedure: BRONCHIAL BIOPSIES;  Surgeon: Garner Nash, DO;  Location: [place];  Service: Pulmonary;;   BRONCHIAL BRUSHINGS  08/05/2020   Procedure: BRONCHIAL BRUSHINGS;  Surgeon: Garner Nash, DO;  Location: [place];  Service: Pulmonary;;   BRONCHIAL NEEDLE ASPIRATION BIOPSY  08/05/2020   Procedure: BRONCHIAL NEEDLE ASPIRATION BIOPSIES;  Surgeon: Garner Nash, DO;  Location: [place];  Service: Pulmonary;;   BRONCHIAL WASHINGS  08/05/2020   Procedure: BRONCHIAL WASHINGS;  Surgeon: Garner Nash, DO;  Location: [place];  Service: Pulmonary;;   c sections     CATARACT EXTRACTION Right    CHOLECYSTECTOMY     COLON RESECTION N/A  04/17/2018   Procedure: LAPAROSCOPIC COLON RESECTION POSSIBLE OSTOMY;  Surgeon: Benjamine Sprague, DO;  Location: [facility];  Service: General;  Laterality: N/A;   COLONOSCOPY WITH PROPOFOL N/A 07/23/2020   Procedure: COLONOSCOPY WITH PROPOFOL;  Surgeon: Toledo, Benay Pike, MD;  Location: [facility];  Service: Gastroenterology;  Laterality: N/A;   DIAGNOSTIC LAPAROSCOPY     ELBOW SURGERY Left    EYE SURGERY Right    glaucoma and stent surgery   FIDUCIAL MARKER PLACEMENT  08/05/2020   Procedure: FIDUCIAL MARKER PLACEMENT;  Surgeon: Garner Nash, DO;  Location: [place] [facility];  Service: Pulmonary;;   GANGLION CYST EXCISION Left 01/22/2016   base of thumb   GASTRIC BYPASS     GLAUCOMA SURGERY     GLAUCOMA SURGERY  12/13/2019   right eye   HERNIA REPAIR     INTERCOSTAL NERVE BLOCK Right 09/01/2020   Procedure: INTERCOSTAL NERVE BLOCK;  Surgeon: Lajuana Matte, MD;  Location: [place];  Service: Thoracic;  Laterality: Right;   LUNG REMOVAL, PARTIAL     R upper lobe   neck fusion  07/04/2017   Dr. Carloyn Manner in [place] Right 09/01/2020   Procedure: NODE DISSECTION;  Surgeon: Lajuana Matte, MD;  Location: [place];  Service: Thoracic;  Laterality: Right;   Derma Right 08/05/2020   Procedure: VIDEO BRONCHOSCOPY WITH ENDOBRONCHIAL NAVIGATION;  Surgeon: Garner Nash, DO;  Location: [place];  Service: Pulmonary;  Laterality: Right;   WRIST SURGERY Right     Social History   Socioeconomic History   Marital status: Married    Spouse name: Not on file   Number of children: 2   Years of education: college   Highest education level: Not on file  Occupational History    Comment: retired  Tobacco Use   Smoking status: Former    Packs/day: 0.50    Years: 10.00    Pack years: 5.00    Types: Cigarettes    Quit date: 1982    Years since quitting: 41.0   Smokeless tobacco: Never  Vaping Use   Vaping Use:  Never used  Substance and Sexual Activity   Alcohol use: Not Currently    Alcohol/week: 1.0 standard drink    Types: 1 Cans of beer per week   Drug use: No   Sexual activity: Not on file  Other Topics Concern   Not on file  Social History Narrative   Patient lives at home with her husband [name]).    Education two years of college.   Caffeine - two glasses of tea.   Social Determinants of Health  Financial Resource Strain: Not on file  Food Insecurity: Not on file  Transportation Needs: Not on file  Physical Activity: Not on file  Stress: Not on file  Social Connections: Not on file  Intimate Partner Violence: Not on file     Allergies  Allergen Reactions   Sulfa Antibiotics Other (See Comments)    Causes skin reaction ( on her mid back)  Pigmentation.  A fixed drug reaction   Tape Other (See Comments)    Blister when pulled of or stays on too long   Valium [Diazepam]     Violent dreams   Latex Hives and Rash   Other Itching and Rash    Metal contact allergy Blister     Outpatient Medications Prior to Visit  Medication Sig Dispense Refill   Ascorbic Acid (VITAMIN C PO) Take 1 mg by mouth 2 (two) times daily.     azelastine (ASTELIN) 0.1 % nasal spray Place 2 sprays into both nostrils daily. 90 mL 1   CALCIUM CITRATE PO Take 1,000 mg by mouth at bedtime. Bariatric Advantage     Cholecalciferol (VITAMIN D3) 125 MCG (5000 UT) CAPS Take 5,000 Units by mouth daily.     Cyanocobalamin (VITAMIN B12) 1000 MCG TBCR Take 1,000 mcg by mouth in the morning.     cyproheptadine (PERIACTIN) 4 MG tablet TAKE 1 TABLET DAILY 90 tablet 1   dorzolamide-timolol (COSOPT) 22.3-6.8 MG/ML ophthalmic solution Place 1 drop into the right eye 2 (two) times daily.     eletriptan (RELPAX) 40 MG tablet Take 1 tablet (40 mg total) by mouth every 2 (two) hours as needed for migraine. 40 mg on set May repeat in 2 hours if headache persists or recurs. Not to exceed 2 tabs/24hours.     EPINEPHrine 0.3  mg/0.3 mL IJ SOAJ injection Inject 0.3 mg into the muscle as needed for anaphylaxis. 1 each 1   folic acid (FOLVITE) 1 MG tablet Take 1 mg by mouth in the morning.     Fremanezumab-vfrm (AJOVY) 225 MG/1.5ML SOAJ Inject 225 mg into the skin every 30 (thirty) days. 4.5 mL 1   gabapentin (NEURONTIN) 400 MG capsule Take 1 capsule (400 mg total) by mouth 2 (two) times daily. 180 capsule 3   Krill Oil 350 MG CAPS Take 350 mg by mouth daily.     levothyroxine (SYNTHROID) 75 MCG tablet Take 1 tablet (75 mcg total) by mouth daily before breakfast. 90 tablet 1   Magnesium Oxide 400 MG CAPS Take 400 mg by mouth in the morning and at bedtime.     meclizine (ANTIVERT) 25 MG tablet Take by mouth.     Multiple Vitamin (MULTIVITAMIN) tablet Take 1 tablet by mouth 2 (two) times daily. Bariatric Advantage     omeprazole (PRILOSEC) 20 MG capsule Take 1 capsule (20 mg total) by mouth in the morning and at bedtime. 60 capsule 5   prednisoLONE acetate (PRED FORTE) 1 % ophthalmic suspension Place 1 drop into the right eye daily.     riboflavin (VITAMIN B-2) 100 MG TABS tablet Take 100 mg by mouth in the morning and at bedtime.     solifenacin (VESICARE) 10 MG tablet Take 10 mg by mouth in the morning.     tiZANidine (ZANAFLEX) 4 MG capsule Take 4 mg by mouth 3 (three) times daily as needed for muscle spasms.     methotrexate (RHEUMATREX) 2.5 MG tablet Take 15 mg by mouth every Monday. As directed by doctor.  No facility-administered medications prior to visit.    Review of Systems  Constitutional:  Negative for chills, fever, malaise/fatigue and weight loss.  HENT:  Negative for hearing loss, sore throat and tinnitus.   Eyes:  Negative for blurred vision and double vision.  Respiratory:  Negative for cough, hemoptysis, sputum production, shortness of breath, wheezing and stridor.   Cardiovascular:  Negative for chest pain, palpitations, orthopnea, leg swelling and PND.  Gastrointestinal:  Negative for abdominal  pain, constipation, diarrhea, heartburn, nausea and vomiting.  Genitourinary:  Negative for dysuria, hematuria and urgency.  Musculoskeletal:  Negative for joint pain and myalgias.  Skin:  Negative for itching and rash.  Neurological:  Positive for dizziness. Negative for tingling, weakness and headaches.  Endo/Heme/Allergies:  Negative for environmental allergies. Does not bruise/bleed easily.  Psychiatric/Behavioral:  Negative for depression. The patient is not nervous/anxious and does not have insomnia.   All other systems reviewed and are negative.   Objective:  Physical Exam Vitals reviewed.  Constitutional:      General: She is not in acute distress.    Appearance: She is well-developed. She is obese.  HENT:     Head: Normocephalic and atraumatic.  Eyes:     General: No scleral icterus.    Conjunctiva/sclera: Conjunctivae normal.     Pupils: Pupils are equal, round, and reactive to light.  Neck:     Vascular: No JVD.     Trachea: No tracheal deviation.  Cardiovascular:     Rate and Rhythm: Normal rate and regular rhythm.     Heart sounds: Normal heart sounds. No murmur heard. Pulmonary:     Effort: Pulmonary effort is normal. No tachypnea, accessory muscle usage or respiratory distress.     Breath sounds: No stridor. No wheezing, rhonchi or rales.  Abdominal:     General: There is no distension.     Palpations: Abdomen is soft.     Tenderness: There is no abdominal tenderness.  Musculoskeletal:        General: No tenderness.     Cervical back: Neck supple.     Right lower leg: No edema.     Left lower leg: No edema.  Lymphadenopathy:     Cervical: No cervical adenopathy.  Skin:    General: Skin is warm and dry.     Capillary Refill: Capillary refill takes less than 2 seconds.     Findings: No rash.  Neurological:     Mental Status: She is alert and oriented to person, place, and time.  Psychiatric:        Behavior: Behavior normal.     Vitals:   02/26/21  1002  BP: 116/78  Pulse: 85  Temp: 97.9 F (36.6 C)  TempSrc: Oral  SpO2: 98%  Weight: 201 lb 6.4 oz (91.4 kg)  Height: 5\' 6"  (1.676 m)   98% on RA BMI Readings from Last 3 Encounters:  02/26/21 32.51 kg/m  11/20/20 31.86 kg/m  11/06/20 31.10 kg/m   Wt Readings from Last 3 Encounters:  02/26/21 201 lb 6.4 oz (91.4 kg)  11/20/20 197 lb 6.4 oz (89.5 kg)  11/06/20 192 lb 10.9 oz (87.4 kg)     CBC    Component Value Date/Time   WBC 7.3 11/03/2020 1026   RBC 4.31 11/03/2020 1026   HGB 12.1 11/03/2020 1026   HCT 38.5 11/03/2020 1026   PLT 282 11/03/2020 1026   MCV 89.3 11/03/2020 1026   MCH 28.1 11/03/2020 1026   MCHC 31.4 11/03/2020  1026   RDW 15.1 11/03/2020 1026   LYMPHSABS 1.9 11/03/2020 1026   MONOABS 0.6 11/03/2020 1026   EOSABS 0.2 11/03/2020 1026   BASOSABS 0.1 11/03/2020 1026    Chest Imaging: 06/13/2020 CT chest: 8 x 6 x 9 mm right upper lobe pulmonary nodule that abuts the minor fissure.  Stable from March imaging however substantially increased in size from February 2020. The patient's images have been independently reviewed by me.    Pulmonary Functions Testing Results: PFT Results Latest Ref Rng & Units 07/11/2020  FVC-Pre L 2.89  FVC-Predicted Pre % 91  FVC-Post L 2.86  FVC-Predicted Post % 90  Pre FEV1/FVC % % 79  Post FEV1/FCV % % 84  FEV1-Pre L 2.28  FEV1-Predicted Pre % 93  FEV1-Post L 2.40  DLCO uncorrected ml/min/mmHg 20.18  DLCO UNC% % 74  DLCO corrected ml/min/mmHg 20.18  DLCO COR %Predicted % 74  DLVA Predicted % 79  TLC L 4.71  TLC % Predicted % 87  RV % Predicted % 95    FeNO:   Pathology:  Echocardiogram:   Heart Catheterization:     Assessment & Plan:     ICD-10-CM   1. Solitary pulmonary nodule on lung CT  R91.1     2. S/P lobectomy of lung  Z90.2     3. Adenocarcinoma of right lung, stage 1 (HCC)  C34.91     4. History of colon cancer  Z85.038     5. Former smoker  Z87.891        Discussion:  This is  a 71 year old female, history of colon cancer, stage IIa in 2020, former smoker quit 1983, small right upper lobe pulmonary nodule that had shown growth, positive for adenocarcinoma of the lung taken for robotic lobectomy by Dr. Kipp Brood in July 2022.  Plan: Continue image surveillance with oncology CT imaging ordered for surveillance imaging.  Since oncology is following her colon cancer looks like her clinical head and get a chest image at the same time.  This will suffice for surveillance imaging of her lung malignancy. Doing well from a respiratory standpoint. We can see her back in 1 year or as needed.    Current Outpatient Medications:    Ascorbic Acid (VITAMIN C PO), Take 1 mg by mouth 2 (two) times daily., Disp: , Rfl:    azelastine (ASTELIN) 0.1 % nasal spray, Place 2 sprays into both nostrils daily., Disp: 90 mL, Rfl: 1   CALCIUM CITRATE PO, Take 1,000 mg by mouth at bedtime. Bariatric Advantage, Disp: , Rfl:    Cholecalciferol (VITAMIN D3) 125 MCG (5000 UT) CAPS, Take 5,000 Units by mouth daily., Disp: , Rfl:    Cyanocobalamin (VITAMIN B12) 1000 MCG TBCR, Take 1,000 mcg by mouth in the morning., Disp: , Rfl:    cyproheptadine (PERIACTIN) 4 MG tablet, TAKE 1 TABLET DAILY, Disp: 90 tablet, Rfl: 1   dorzolamide-timolol (COSOPT) 22.3-6.8 MG/ML ophthalmic solution, Place 1 drop into the right eye 2 (two) times daily., Disp: , Rfl:    eletriptan (RELPAX) 40 MG tablet, Take 1 tablet (40 mg total) by mouth every 2 (two) hours as needed for migraine. 40 mg on set May repeat in 2 hours if headache persists or recurs. Not to exceed 2 tabs/24hours., Disp: , Rfl:    EPINEPHrine 0.3 mg/0.3 mL IJ SOAJ injection, Inject 0.3 mg into the muscle as needed for anaphylaxis., Disp: 1 each, Rfl: 1   folic acid (FOLVITE) 1 MG tablet, Take 1 mg by  mouth in the morning., Disp: , Rfl:    Fremanezumab-vfrm (AJOVY) 225 MG/1.5ML SOAJ, Inject 225 mg into the skin every 30 (thirty) days., Disp: 4.5 mL, Rfl: 1    gabapentin (NEURONTIN) 400 MG capsule, Take 1 capsule (400 mg total) by mouth 2 (two) times daily., Disp: 180 capsule, Rfl: 3   Krill Oil 350 MG CAPS, Take 350 mg by mouth daily., Disp: , Rfl:    levothyroxine (SYNTHROID) 75 MCG tablet, Take 1 tablet (75 mcg total) by mouth daily before breakfast., Disp: 90 tablet, Rfl: 1   Magnesium Oxide 400 MG CAPS, Take 400 mg by mouth in the morning and at bedtime., Disp: , Rfl:    meclizine (ANTIVERT) 25 MG tablet, Take by mouth., Disp: , Rfl:    Multiple Vitamin (MULTIVITAMIN) tablet, Take 1 tablet by mouth 2 (two) times daily. Bariatric Advantage, Disp: , Rfl:    omeprazole (PRILOSEC) 20 MG capsule, Take 1 capsule (20 mg total) by mouth in the morning and at bedtime., Disp: 60 capsule, Rfl: 5   prednisoLONE acetate (PRED FORTE) 1 % ophthalmic suspension, Place 1 drop into the right eye daily., Disp: , Rfl:    riboflavin (VITAMIN B-2) 100 MG TABS tablet, Take 100 mg by mouth in the morning and at bedtime., Disp: , Rfl:    solifenacin (VESICARE) 10 MG tablet, Take 10 mg by mouth in the morning., Disp: , Rfl:    tiZANidine (ZANAFLEX) 4 MG capsule, Take 4 mg by mouth 3 (three) times daily as needed for muscle spasms., Disp: , Rfl:   I spent 20 minutes dedicated to the care of this patient on the date of this encounter to include pre-visit review of records, face-to-face time with the patient discussing conditions above, post visit ordering of testing, clinical documentation with the electronic health record, making appropriate referrals as documented, and communicating necessary findings to members of the patients care team.    Garner Nash, DO [facility] 02/26/2021 10:21 AM

## 2021-03-04 ENCOUNTER — Inpatient Hospital Stay: Admission: RE | Admit: 2021-03-04 | Payer: Medicare Other | Source: Ambulatory Visit

## 2021-03-18 ENCOUNTER — Ambulatory Visit (INDEPENDENT_AMBULATORY_CARE_PROVIDER_SITE_OTHER): Payer: Medicare Other

## 2021-03-18 DIAGNOSIS — J309 Allergic rhinitis, unspecified: Secondary | ICD-10-CM

## 2021-04-07 ENCOUNTER — Other Ambulatory Visit: Payer: Self-pay

## 2021-04-07 ENCOUNTER — Inpatient Hospital Stay: Payer: Medicare Other | Attending: Oncology

## 2021-04-07 DIAGNOSIS — C187 Malignant neoplasm of sigmoid colon: Secondary | ICD-10-CM

## 2021-04-07 DIAGNOSIS — Z85038 Personal history of other malignant neoplasm of large intestine: Secondary | ICD-10-CM | POA: Insufficient documentation

## 2021-04-07 DIAGNOSIS — Z902 Acquired absence of lung [part of]: Secondary | ICD-10-CM | POA: Insufficient documentation

## 2021-04-07 DIAGNOSIS — R059 Cough, unspecified: Secondary | ICD-10-CM | POA: Insufficient documentation

## 2021-04-07 DIAGNOSIS — Z85118 Personal history of other malignant neoplasm of bronchus and lung: Secondary | ICD-10-CM | POA: Diagnosis present

## 2021-04-07 DIAGNOSIS — Z79899 Other long term (current) drug therapy: Secondary | ICD-10-CM | POA: Insufficient documentation

## 2021-04-07 DIAGNOSIS — Z87891 Personal history of nicotine dependence: Secondary | ICD-10-CM | POA: Insufficient documentation

## 2021-04-07 LAB — CBC WITH DIFFERENTIAL/PLATELET
Abs Immature Granulocytes: 0.02 10*3/uL (ref 0.00–0.07)
Basophils Absolute: 0.1 10*3/uL (ref 0.0–0.1)
Basophils Relative: 1 %
Eosinophils Absolute: 0.2 10*3/uL (ref 0.0–0.5)
Eosinophils Relative: 3 %
HCT: 39.4 % (ref 36.0–46.0)
Hemoglobin: 12.1 g/dL (ref 12.0–15.0)
Immature Granulocytes: 0 %
Lymphocytes Relative: 39 %
Lymphs Abs: 3 10*3/uL (ref 0.7–4.0)
MCH: 27.4 pg (ref 26.0–34.0)
MCHC: 30.7 g/dL (ref 30.0–36.0)
MCV: 89.3 fL (ref 80.0–100.0)
Monocytes Absolute: 0.7 10*3/uL (ref 0.1–1.0)
Monocytes Relative: 9 %
Neutro Abs: 3.8 10*3/uL (ref 1.7–7.7)
Neutrophils Relative %: 48 %
Platelets: 302 10*3/uL (ref 150–400)
RBC: 4.41 MIL/uL (ref 3.87–5.11)
RDW: 15.9 % — ABNORMAL HIGH (ref 11.5–15.5)
WBC: 7.7 10*3/uL (ref 4.0–10.5)
nRBC: 0 % (ref 0.0–0.2)

## 2021-04-07 LAB — COMPREHENSIVE METABOLIC PANEL
ALT: 27 U/L (ref 0–44)
AST: 36 U/L (ref 15–41)
Albumin: 3.8 g/dL (ref 3.5–5.0)
Alkaline Phosphatase: 102 U/L (ref 38–126)
Anion gap: 8 (ref 5–15)
BUN: 11 mg/dL (ref 8–23)
CO2: 24 mmol/L (ref 22–32)
Calcium: 8.5 mg/dL — ABNORMAL LOW (ref 8.9–10.3)
Chloride: 105 mmol/L (ref 98–111)
Creatinine, Ser: 0.71 mg/dL (ref 0.44–1.00)
GFR, Estimated: >60 mL/min (ref >60)
Glucose, Bld: 106 mg/dL — ABNORMAL HIGH (ref 70–99)
Potassium: 3.9 mmol/L (ref 3.5–5.1)
Sodium: 137 mmol/L (ref 135–145)
Total Bilirubin: 0.8 mg/dL (ref 0.3–1.2)
Total Protein: 7.1 g/dL (ref 6.5–8.1)

## 2021-04-08 ENCOUNTER — Ambulatory Visit (INDEPENDENT_AMBULATORY_CARE_PROVIDER_SITE_OTHER): Payer: Medicare Other | Admitting: Allergy & Immunology

## 2021-04-08 ENCOUNTER — Encounter: Payer: Self-pay | Admitting: Allergy & Immunology

## 2021-04-08 VITALS — BP 120/76 | HR 77 | Resp 17

## 2021-04-08 DIAGNOSIS — K219 Gastro-esophageal reflux disease without esophagitis: Secondary | ICD-10-CM

## 2021-04-08 DIAGNOSIS — J3089 Other allergic rhinitis: Secondary | ICD-10-CM | POA: Diagnosis not present

## 2021-04-08 DIAGNOSIS — J302 Other seasonal allergic rhinitis: Secondary | ICD-10-CM

## 2021-04-08 LAB — CEA: CEA: 2.3 ng/mL (ref 0.0–4.7)

## 2021-04-08 MED ORDER — CYPROHEPTADINE HCL 4 MG PO TABS: 4.0000 mg | ORAL_TABLET | Freq: Two times a day (BID) | ORAL | 3 refills | Status: DC

## 2021-04-08 MED ORDER — AZELASTINE HCL 0.1 % NA SOLN: 2.0000 | Freq: Every day | NASAL | 3 refills | Status: DC

## 2021-04-08 NOTE — Progress Notes (Signed)
FOLLOW UP  Date of Service/Encounter:  04/08/21   Assessment:   Seasonal and perennial allergic rhinitis (molds, dust mites, cat and dog) - on allergen immunotherapy with recently re-mixed vials    Gastroesophageal reflux disease   Recent diagnosis of colon cancer - s/p partial bowel resection  Recent diagnosis of lung cancer - s/p resection   Recent diagnosis of Hashimoto's thyroiditis - sees Dr. Dorris Fetch  New onset vertigo   Plan/Recommendations:   1. Perennial and seasonal allergic rhinitis (molds, cat, dog, dust mite) - Continue with the allergy shots and have access to epinephrine auto injector device.  - We will send in a prescription for EpiPen 0.3 mg - Continue Periactin 4mg  at night (I wrote enough for twice daily if you need to take extra doses for the best symptom control).  - Continue azelastine 2 sprays each nostril once a day as needed for drainage down throat - We are changing you to a faster build up for new vials so you can save some visits.   2. GERD - Continue with Prilosec twice daily as you are doing.   3. Return in about 1 year (around 04/08/2022).   Subjective:   Jordan Mahoney is a 71 y.o. 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 Valeta Harms Oncologist: Dr. Talbert Cage Endocrinologist: Dr. Dorris Fetch ENT: Dr. Redmond Baseman  History obtained from: chart review and patient.  Jordan Mahoney is a 71 y.o. female presenting for a follow up visit.  She was last seen in August 2022.  At that time, we continue with allergy shots as well as Periactin at night.  We also continue with Astelin 2 sprays per nostril daily as needed.  We continue with Prilosec for her GERD.  Since last visit, she has mostly done well. She certainly seems more chipper than she did the last time we saw her.  She remains on Periactin once daily.  She takes this at night and does not feel any can a hangover from the medication.  She also is on the Astelin which seems to be working fairly well.  The Nasonex worked the best, but the [place] stopped covering it.  Kynzlie is on allergen immunotherapy. She receives one injection. Immunotherapy script #1 contains molds, dust mites, cat and dog. She currently receives 0.53mL of the RED vial (1/100). She started shots in early 2016 and reached maintenance in September of 2016. She is doing well on her shots without any adverse events.   She has vertigo and started physical therapy a couple of months ago. This has worked. She does still have episodes. She gave her driving after her neck fusion  surgery. She cannot turn her head. She would not end up on the ground with vomiting, but she did have some nausea. But overall the treatments are working. She cannot whip around and do things quickly like she used to. She was trialed on medications but this did not do anything at all.  She is stable from an oncology perspective.  She is now following an oncologist who is managing both her colon cancer and her lung cancer.  Both are in stage I and caught very early.  Her prognosis is very good.  They do not think that the cancer in the lung is  metastasis from the one in the colon.  The pathologies were different. She sees her oncologist tomorrow.  She remains on levothyroxine for her hypothyroidism.  She is on 75 mcg daily. She last saw Dr. Dorris Fetch in October 2022. She sees him every 6 months.   She had a UTI and finished the medication last week.  Otherwise she has not needed antibiotics at all for any infections at all.   Otherwise, there have been no changes to her past medical history, surgical history, family history, or social history.    Review of Systems  Constitutional: Negative.  Negative for fever, malaise/fatigue and weight loss.  HENT: Negative.  Negative for congestion, ear discharge and ear pain.        Positive for vertigo.  Eyes:  Negative for pain, discharge and redness.  Respiratory:  Negative for cough, sputum production, shortness of breath and wheezing.   Cardiovascular: Negative.  Negative for chest pain and palpitations.  Gastrointestinal:  Negative for abdominal pain, constipation, diarrhea, heartburn, nausea and vomiting.  Skin: Negative.  Negative for itching and rash.  Neurological:  Negative for dizziness and headaches.  Endo/Heme/Allergies:  Negative for environmental allergies. Does not bruise/bleed easily.      Objective:   Blood pressure 120/76, pulse 77, resp. rate 17, SpO2 98 %. There is no height or weight on file to calculate BMI.    Physical Exam Vitals reviewed.  Constitutional:      Appearance: She is well-developed.  HENT:     Head: Normocephalic and atraumatic.     Right Ear: Tympanic membrane, ear canal and external ear normal.     Left Ear: Tympanic membrane, ear canal and external ear normal.     Ears:     Comments: Hearing aids in place.    Nose: No nasal deformity, septal deviation, mucosal edema or rhinorrhea.     Right Turbinates: Enlarged, swollen and pale.     Left Turbinates: Enlarged, swollen and pale.     Right Sinus: No maxillary sinus tenderness or frontal  sinus tenderness.     Left Sinus: No maxillary sinus tenderness or frontal sinus tenderness.     Comments: No polyps present.     Mouth/Throat:     Lips: Pink.     Mouth: Mucous membranes are moist. Mucous membranes are not pale and not dry.     Pharynx: Uvula midline.     Tonsils: No tonsillar exudate or tonsillar abscesses. 2+ on the right. 2+ on the left.  Eyes:     General: Lids are normal. Allergic shiner present.        Right eye: No discharge.        Left eye: No discharge.     Conjunctiva/sclera: Conjunctivae normal.     Right eye: Right conjunctiva is not injected. No chemosis.    Left eye: Left conjunctiva is  not injected. No chemosis.    Pupils: Pupils are equal, round, and reactive to light.  Cardiovascular:     Rate and Rhythm: Normal rate and regular rhythm.     Heart sounds: Normal heart sounds.  Pulmonary:     Effort: Pulmonary effort is normal. No tachypnea, accessory muscle usage or respiratory distress.     Breath sounds: Normal breath sounds. No wheezing, rhonchi or rales.     Comments: Moving air well in all lung fields.  No increased work of breathing. Chest:     Chest wall: No tenderness.  Lymphadenopathy:     Cervical: No cervical adenopathy.  Skin:    General: Skin is warm.     Capillary Refill: Capillary refill takes less than 2 seconds.     Coloration: Skin is not pale.     Findings: No abrasion, erythema, petechiae or rash. Rash is not papular, urticarial or vesicular.  Neurological:     Mental Status: She is alert.  Psychiatric:        Behavior: Behavior is cooperative.     Diagnostic studies: none     Salvatore Marvel, MD  [facility] [employer]

## 2021-04-08 NOTE — Patient Instructions (Addendum)
1. Perennial and seasonal allergic rhinitis (molds, cat, dog, dust mite) - Continue with the allergy shots and have access to epinephrine auto injector device.  - We will send in a prescription for EpiPen 0.3 mg - Continue Periactin 4mg  at night (I wrote enough for twice daily if you need to take extra doses for the best symptom control).  - Continue azelastine 2 sprays each nostril once a day as needed for drainage down throat - We are changing you to a faster build up for new vials so you can save some visits.   2. GERD - Continue with Prilosec twice daily as you are doing.   3. Return in about 1 year (around 04/08/2022).    Please inform us of any Emergency Department visits, hospitalizations, or changes in symptoms. Call us before going to the ED for breathing or allergy symptoms since we might be able to fit you in for a sick visit. Feel free to contact us anytime with any questions, problems, or concerns.  It was a pleasure to see you again today!  Websites that have reliable patient information: 1. American Academy of Asthma, Allergy, and Immunology: www.aaaai.org 2. Food Allergy Research and Education (FARE): foodallergy.org 3. Mothers of Asthmatics: http://www.asthmacommunitynetwork.org 4. American College of Allergy, Asthma, and Immunology: www.acaai.org   COVID-19 Vaccine Information can be found at: ShippingScam.co.uk For questions related to vaccine distribution or appointments, please email [email or web address] or call [phone].   We realize that you might be concerned about having an allergic reaction to the COVID19 vaccines. To help with that concern, WE ARE OFFERING THE COVID19 VACCINES IN OUR OFFICE! Ask the front desk for dates!     Like [place] on [place] and Instagram for our latest updates!      A healthy democracy works best when [employer] participate! Make sure you are registered to vote! If you  have moved or changed any of your contact information, you will need to get this updated before voting!  In some cases, you MAY be able to register to vote online: [email or web address]

## 2021-04-09 ENCOUNTER — Inpatient Hospital Stay (HOSPITAL_BASED_OUTPATIENT_CLINIC_OR_DEPARTMENT_OTHER): Payer: Medicare Other | Admitting: Oncology

## 2021-04-09 ENCOUNTER — Encounter: Payer: Self-pay | Admitting: Oncology

## 2021-04-09 ENCOUNTER — Other Ambulatory Visit: Payer: Self-pay

## 2021-04-09 VITALS — BP 128/90 | HR 72 | Temp 97.9°F | Wt 204.0 lb

## 2021-04-09 DIAGNOSIS — C3491 Malignant neoplasm of unspecified part of right bronchus or lung: Secondary | ICD-10-CM

## 2021-04-09 DIAGNOSIS — C187 Malignant neoplasm of sigmoid colon: Secondary | ICD-10-CM | POA: Diagnosis not present

## 2021-04-09 DIAGNOSIS — Z85038 Personal history of other malignant neoplasm of large intestine: Secondary | ICD-10-CM | POA: Diagnosis not present

## 2021-04-09 NOTE — Progress Notes (Signed)
Hematology/Oncology Progress note [phone] [phone]      Patient Care Team: Ludwig Clarks, FNP as PCP - General (Family Medicine) Dorthula Rue., MD as Referring Physician (Otolaryngology) Clent Jacks, RN as Registered Nurse  REFERRING PROVIDER: Dr.Toledo / Gerarda Gunther 'CHIEF COMPLAINTS/REASON FOR VISIT:  Follow up Stage IIA colorectal cancer, stage I right upper lung cancer  HISTORY OF PRESENTING ILLNESS:  Jordan Mahoney is a  71 y.o.  female with PMH listed below who was referred to me for evaluation of stage IIA colorectal cancer. Patient was initially seen care physician for evaluation of fatigue.  Lab work-up showed that patient's anemic.  Hemoccult was obtained and was positive.  Patient was referred to [facility] and was seen and evaluated by Dr. Alice Reichert.  Reports that she had 2-3 bowel movement daily sometimes mushy and sometimes formed.  Denies seeing any bright red blood in the stool.  Denies any associated abdominal pain, gastric discomfort.  Appetite seems to be stable.  History of gastric bypass in 2010.  Cholecystectomy in 2000.  ?Unintentional weight loss 5 to 6 pounds for the past 2 months.   prior records showed she weighs 149 pounds on 09/28/2017, weighed 160 pounds 05/03/2017 Colonoscopy was obtained on 03/27/2018.  6 mm polyp found in sigmoid colon.  Polyp is sessile.An ulcerated partially obstructing mass was found in the rectosigmoid colon.  Mass was circumferential.  Measured 5 cm in length.  No bleeding was present.  Biopsy was taken.  Nonbleeding internal hemorrhoids were found during retroflexion.  #04/17/2018 underwent surgery.  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 No loss of nuclear expression of MMR proteins: Low probability of MSI-H.  Stage IIA, No adjuvant chemotherapy was not  Offered  # Stage ! Lung cancer.   Slow-growing lung nodule in the right upper lobe.  Case on 05/01/2020 tumor board.Consensus reached upon referring patient to pulmonology Dr. Patsey Berthold for biopsy via bronchoscopy. 06/27/2020, PET scan shows no distant metastasis.  Right upper lobe pulm nodule SUV 1.3.  Asymmetric palatine tonsilla activity probably physiologic.  Lack of CT correlate. 08/05/2020 right upper lobe fine-needle aspiration the bronchoscopy showed malignant cells consistent with non-small cell carcinoma.  09/01/2020, patient underwent right upper lobe lobectomy with lymph node dissection.  No LVI, pT1b pN0   INTERVAL HISTORY DOROTHEY Mahoney is a 71 y.o. female who has above history reviewed by me today presents for follow up visit for management of Stage IIA colorectal cancer and Stage ! Lung cancer.   Patient reports feeling well.  No unintentional weight loss, hemoptysis, shortness of breath, blood in the stool, abdominal pain.  Appetite is fair. Occasionally she has cough spells.  CT chest abdomen pelvis was ordered and was not done due to scheduling error.  Review of Systems  Constitutional:  Negative for appetite change, chills, fatigue and fever.  HENT:   Negative for hearing loss and voice change.   Eyes:  Negative for eye problems.  Respiratory:  Negative for chest tightness and cough.   Cardiovascular:  Negative for chest pain.  Gastrointestinal:  Negative for abdominal distention, abdominal pain and blood in stool.  Endocrine: Negative for hot flashes.  Genitourinary:  Negative for difficulty urinating and frequency.   Musculoskeletal:  Negative for arthralgias.  Skin:  Negative for itching and rash.  Neurological:  Negative for extremity weakness.  Hematological:  Negative for adenopathy.  Psychiatric/Behavioral:  Negative for  confusion.       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[facility])    Sensorineural hearing loss    Tingling of both feet    Tinnitus     SURGICAL HISTORY: Past Surgical History:  Procedure Laterality Date   ABDOMINAL HYSTERECTOMY     belly button     belly button hernia   BRONCHIAL BIOPSY  08/05/2020   Procedure: BRONCHIAL BIOPSIES;  Surgeon: Garner Nash, DO;  Location: [facility];  Service: Pulmonary;;   BRONCHIAL BRUSHINGS  08/05/2020   Procedure: BRONCHIAL BRUSHINGS;  Surgeon: Garner Nash, DO;  Location: [facility];  Service: Pulmonary;;   BRONCHIAL NEEDLE ASPIRATION BIOPSY  08/05/2020   Procedure: BRONCHIAL NEEDLE ASPIRATION BIOPSIES;  Surgeon: Garner Nash, DO;  Location: [facility];  Service: Pulmonary;;   BRONCHIAL WASHINGS  08/05/2020   Procedure: BRONCHIAL WASHINGS;  Surgeon: Garner Nash, DO;  Location: [facility];  Service: Pulmonary;;   c sections     CATARACT EXTRACTION Right    CHOLECYSTECTOMY     COLON RESECTION N/A 04/17/2018   Procedure: LAPAROSCOPIC COLON RESECTION POSSIBLE OSTOMY;  Surgeon: Benjamine Sprague, DO;  Location: [facility];  Service: General;  Laterality: N/A;   COLONOSCOPY WITH PROPOFOL N/A 07/23/2020   Procedure: COLONOSCOPY WITH PROPOFOL;  Surgeon: Toledo, Benay Pike, MD;  Location: [facility];  Service: Gastroenterology;  Laterality: N/A;   DIAGNOSTIC LAPAROSCOPY     ELBOW SURGERY Left    EYE SURGERY Right    glaucoma and stent surgery   FIDUCIAL MARKER PLACEMENT  08/05/2020   Procedure: FIDUCIAL MARKER PLACEMENT;  Surgeon: Garner Nash, DO;  Location: [facility];  Service: Pulmonary;;   GANGLION CYST EXCISION Left 01/22/2016   base of thumb   GASTRIC BYPASS     GLAUCOMA SURGERY     GLAUCOMA SURGERY   12/13/2019   right eye   HERNIA REPAIR     INTERCOSTAL NERVE BLOCK Right 09/01/2020   Procedure: INTERCOSTAL NERVE BLOCK;  Surgeon: Lajuana Matte, MD;  Location: [facility];  Service: Thoracic;  Laterality: Right;   LUNG REMOVAL, PARTIAL     R upper lobe   neck fusion  07/04/2017   Dr. Carloyn Manner in [place] Right 09/01/2020   Procedure: NODE DISSECTION;  Surgeon: Lajuana Matte, MD;  Location: [facility];  Service: Thoracic;  Laterality: Right;   [place] Right 08/05/2020   Procedure: VIDEO BRONCHOSCOPY WITH ENDOBRONCHIAL NAVIGATION;  Surgeon: Garner Nash, DO;  Location: [facility];  Service: Pulmonary;  Laterality: Right;   WRIST SURGERY Right     SOCIAL HISTORY: Social History   Socioeconomic History   Marital status: Married    Spouse name: Not on file   Number of children: 2   Years of education: college   Highest education level: Not on file  Occupational History    Comment: retired  Tobacco Use   Smoking  status: Former    Packs/day: 0.50    Years: 10.00    Pack years: 5.00    Types: Cigarettes    Quit date: 1982    Years since quitting: 41.1   Smokeless tobacco: Never  Vaping Use   Vaping Use: Never used  Substance and Sexual Activity   Alcohol use: Not Currently    Alcohol/week: 1.0 standard drink    Types: 1 Cans of beer per week   Drug use: No   Sexual activity: Not on file  Other Topics Concern   Not on file  Social History Narrative   Patient lives at home with her husband [name]).    Education two years of college.   Caffeine - two glasses of tea.   Social Determinants of Health   Financial Resource Strain: Not on file  Food Insecurity: Not on file  Transportation Needs: Not on file  Physical Activity: Not on file  Stress: Not on file  Social Connections: Not on file  Intimate Partner Violence: Not on file    FAMILY HISTORY: Family History  Problem Relation Age of  Onset   Diabetes Father    Stroke Father    Squamous cell carcinoma Mother    Migraines Daughter    Breast cancer Paternal Aunt    Allergic rhinitis Neg Hx    Angioedema Neg Hx    Asthma Neg Hx    Eczema Neg Hx    Immunodeficiency Neg Hx    Urticaria Neg Hx     ALLERGIES:  is allergic to diazepam, sulfa antibiotics, tape, latex, and other.  MEDICATIONS:  Current Outpatient Medications  Medication Sig Dispense Refill   Ascorbic Acid (VITAMIN C PO) Take 1 mg by mouth 2 (two) times daily.     azelastine (ASTELIN) 0.1 % nasal spray Place 2 sprays into both nostrils daily. 90 mL 3   CALCIUM CITRATE PO Take 1,000 mg by mouth at bedtime. Bariatric Advantage     Cholecalciferol (VITAMIN D3) 125 MCG (5000 UT) CAPS Take 5,000 Units by mouth daily.     Cyanocobalamin (VITAMIN B12) 1000 MCG TBCR Take 1,000 mcg by mouth in the morning.     cyproheptadine (PERIACTIN) 4 MG tablet Take 1 tablet (4 mg total) by mouth 2 (two) times daily. 180 tablet 3   dorzolamide-timolol (COSOPT) 22.3-6.8 MG/ML ophthalmic solution Place 1 drop into the right eye 2 (two) times daily.     eletriptan (RELPAX) 40 MG tablet Take 1 tablet (40 mg total) by mouth every 2 (two) hours as needed for migraine. 40 mg on set May repeat in 2 hours if headache persists or recurs. Not to exceed 2 tabs/24hours.     EPINEPHrine 0.3 mg/0.3 mL IJ SOAJ injection Inject 0.3 mg into the muscle as needed for anaphylaxis. 1 each 1   folic acid (FOLVITE) 1 MG tablet Take 1 mg by mouth in the morning.     Fremanezumab-vfrm (AJOVY) 225 MG/1.5ML SOAJ Inject 225 mg into the skin every 30 (thirty) days. 4.5 mL 1   gabapentin (NEURONTIN) 400 MG capsule Take 1 capsule (400 mg total) by mouth 2 (two) times daily. 180 capsule 3   Krill Oil 350 MG CAPS Take 350 mg by mouth daily.     levothyroxine (SYNTHROID) 75 MCG tablet Take 1 tablet (75 mcg total) by mouth daily before breakfast. 90 tablet 1   Magnesium Oxide 400 MG CAPS Take 400 mg by mouth in the  morning and at bedtime.  meclizine (ANTIVERT) 25 MG tablet Take by mouth.     Multiple Vitamin (MULTIVITAMIN) tablet Take 1 tablet by mouth 2 (two) times daily. Bariatric Advantage     nitrofurantoin, macrocrystal-monohydrate, (MACROBID) 100 MG capsule Take 100 mg by mouth 2 (two) times daily.     omeprazole (PRILOSEC) 20 MG capsule Take 1 capsule (20 mg total) by mouth in the morning and at bedtime. 60 capsule 5   prednisoLONE acetate (PRED FORTE) 1 % ophthalmic suspension Place 1 drop into the right eye daily.     riboflavin (VITAMIN B-2) 100 MG TABS tablet Take 100 mg by mouth in the morning and at bedtime.     solifenacin (VESICARE) 10 MG tablet Take 10 mg by mouth in the morning.     tiZANidine (ZANAFLEX) 4 MG capsule Take 4 mg by mouth 3 (three) times daily as needed for muscle spasms.     No current facility-administered medications for this visit.     PHYSICAL EXAMINATION: ECOG PERFORMANCE STATUS: 1 - Symptomatic but completely ambulatory Vitals:   04/09/21 0944  BP: 128/90  Pulse: 72  Temp: 97.9 F (36.6 C)   Filed Weights   04/09/21 0944  Weight: 204 lb (92.5 kg)    Physical Exam Constitutional:      General: She is not in acute distress.    Appearance: She is not diaphoretic.  HENT:     Head: Normocephalic and atraumatic.     Nose: Nose normal.     Mouth/Throat:     Pharynx: No oropharyngeal exudate.  Eyes:     General: No scleral icterus.    Pupils: Pupils are equal, round, and reactive to light.  Cardiovascular:     Rate and Rhythm: Normal rate and regular rhythm.     Heart sounds: No murmur heard. Pulmonary:     Effort: Pulmonary effort is normal. No respiratory distress.     Breath sounds: No rales.  Chest:     Chest wall: No tenderness.  Abdominal:     General: There is no distension.     Palpations: Abdomen is soft.     Tenderness: There is no abdominal tenderness.  Musculoskeletal:        General: Normal range of motion.     Cervical back:  Normal range of motion and neck supple.  Skin:    General: Skin is warm and dry.     Findings: No erythema.  Neurological:     Mental Status: She is alert and oriented to person, place, and time.     Cranial Nerves: No cranial nerve deficit.     Motor: No abnormal muscle tone.     Coordination: Coordination normal.  Psychiatric:        Mood and Affect: Affect normal.     RADIOGRAPHIC STUDIES: I have personally reviewed the radiological images as listed and agreed with the findings in the report. No results found.     LABORATORY DATA:  I have reviewed the data as listed Lab Results  Component Value Date   WBC 7.7 04/07/2021   HGB 12.1 04/07/2021   HCT 39.4 04/07/2021   MCV 89.3 04/07/2021   PLT 302 04/07/2021   Recent Labs    09/03/20 0500 11/03/20 1026 04/07/21 1053  NA 141 140 137  K 3.9 3.7 3.9  CL 113* 105 105  CO2 _0 GLUCOSE 94 119* 106*  BUN _1 CREATININE 0.66 0.75 0.71  CALCIUM 8.2* 8.5* 8.5*  GFRNONAA >  60 >60 >60  PROT 5.1* 7.3 7.1  ALBUMIN 2.8* 4.0 3.8  AST 32 32 36  ALT _0 ALKPHOS 78 104 102  BILITOT 0.9 0.8 0.8    Iron/TIBC/Ferritin/ %Sat    Component Value Date/Time   IRON 83 07/31/2018 0947   TIBC 411 07/31/2018 0947   FERRITIN 16 07/31/2018 0947   IRONPCTSAT 20 07/31/2018 0947     Preop CEA was obtained on 04/03/2018, at 1.8  ASSESSMENT & PLAN:  1. 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  CEA stable. Check CT chest abdomen pelvis now  #Stage I right lung adenocarcinoma, status post lobectomy. Recommend every 6 month CT surveillance. CT surveillance. Labs  reviewed and discussed with patient.  Follow-up 6 months with lab MD CBC CMP CEA after surveillance CT scan. Orders Placed This Encounter  Procedures   CBC with Differential/Platelet    Standing Status:   Future    Standing Expiration Date:   04/09/2022   Comprehensive metabolic panel    Standing Status:   Future    Standing Expiration Date:   04/09/2022   CEA    Standing Status:   Future    Standing Expiration Date:   04/09/2022    We spent sufficient time to discuss many aspect of care, questions were answered to patient's satisfaction.  Earlie Server, MD, PhD  04/09/2021

## 2021-04-13 ENCOUNTER — Ambulatory Visit
Admission: RE | Admit: 2021-04-13 | Discharge: 2021-04-13 | Disposition: A | Discharge status: Home / Self Care | Payer: Medicare Other | Source: Ambulatory Visit | Attending: Oncology | Admitting: Oncology

## 2021-04-13 ENCOUNTER — Other Ambulatory Visit: Payer: Self-pay

## 2021-04-13 DIAGNOSIS — C187 Malignant neoplasm of sigmoid colon: Secondary | ICD-10-CM | POA: Insufficient documentation

## 2021-04-13 MED ORDER — IOHEXOL 300 MG/ML  SOLN
100.0000 mL | Freq: Once | INTRAMUSCULAR | Status: AC | PRN
  Administered 2021-04-13: 100 mL via INTRAVENOUS

## 2021-04-15 ENCOUNTER — Ambulatory Visit (INDEPENDENT_AMBULATORY_CARE_PROVIDER_SITE_OTHER): Payer: Medicare Other

## 2021-04-15 DIAGNOSIS — J309 Allergic rhinitis, unspecified: Secondary | ICD-10-CM

## 2021-05-13 ENCOUNTER — Ambulatory Visit (INDEPENDENT_AMBULATORY_CARE_PROVIDER_SITE_OTHER): Payer: Medicare Other

## 2021-05-13 DIAGNOSIS — J309 Allergic rhinitis, unspecified: Secondary | ICD-10-CM

## 2021-05-14 LAB — T4, FREE: Free T4: 1.18 ng/dL (ref 0.82–1.77)

## 2021-05-14 LAB — TSH: TSH: 1.79 u[IU]/mL (ref 0.450–4.500)

## 2021-05-20 ENCOUNTER — Ambulatory Visit (INDEPENDENT_AMBULATORY_CARE_PROVIDER_SITE_OTHER): Payer: Medicare Other

## 2021-05-20 DIAGNOSIS — J309 Allergic rhinitis, unspecified: Secondary | ICD-10-CM | POA: Diagnosis not present

## 2021-05-21 ENCOUNTER — Ambulatory Visit (INDEPENDENT_AMBULATORY_CARE_PROVIDER_SITE_OTHER): Payer: Medicare Other | Admitting: "Endocrinology

## 2021-05-21 ENCOUNTER — Encounter: Payer: Self-pay | Admitting: "Endocrinology

## 2021-05-21 VITALS — BP 108/76 | HR 72 | Ht 66.0 in | Wt 207.4 lb

## 2021-05-21 DIAGNOSIS — E063 Autoimmune thyroiditis: Secondary | ICD-10-CM

## 2021-05-21 DIAGNOSIS — E038 Other specified hypothyroidism: Secondary | ICD-10-CM

## 2021-05-21 DIAGNOSIS — E119 Type 2 diabetes mellitus without complications: Secondary | ICD-10-CM

## 2021-05-21 LAB — POCT GLYCOSYLATED HEMOGLOBIN (HGB A1C): HbA1c, POC (controlled diabetic range): 6.1 % (ref 0.0–7.0)

## 2021-05-21 NOTE — Progress Notes (Signed)
?                                   ?                                           ?                                       05/21/2021, 4:42 PM ? ? ?Jordan Mahoney is a 71 y.o.-year-old female patient returning for follow-up after she was seen in consultation for hypothyroidism.  She is also being followed up for type 2 DM.  ? ?PCP:  Ludwig Clarks, FNP. ? ? ?Past Medical History:  ?Diagnosis Date  ? Allergic rhinoconjunctivitis   ? Allergies   ? Arthritis   ? Back pain   ? Chronic uveitis   ? Depression   ? Frequent UTI   ? GERD (gastroesophageal reflux disease)   ? HA (headache)   ? Hearing loss   ? wearing bilateral aides  ? History of hiatal hernia   ? Hypothyroidism   ? Lactose intolerance   ? Migraine   ? Osteopenia   ? Pre-diabetes   ? 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 Sensorineural hearing loss   ? Tingling of both feet   ? Tinnitus   ? ? ?Past Surgical History:  ?Procedure Laterality Date  ? ABDOMINAL HYSTERECTOMY    ? belly button    ? belly button hernia  ? BRONCHIAL BIOPSY  08/05/2020  ? Procedure: BRONCHIAL BIOPSIES;  Surgeon: Garner Nash, DO;  Location: [place];  Service: Pulmonary;;  ? BRONCHIAL BRUSHINGS  08/05/2020  ? Procedure: BRONCHIAL BRUSHINGS;  Surgeon: Garner Nash, DO;  Location: [place];  Service: Pulmonary;;  ? BRONCHIAL NEEDLE ASPIRATION BIOPSY  08/05/2020  ? Procedure: BRONCHIAL NEEDLE ASPIRATION BIOPSIES;  Surgeon: Garner Nash, DO;  Location: [place];  Service: Pulmonary;;  ? BRONCHIAL WASHINGS  08/05/2020  ? Procedure: BRONCHIAL WASHINGS;  Surgeon: Garner Nash, DO;  Location: [facility];  Service: Pulmonary;;  ? c sections    ? CATARACT EXTRACTION Right   ? CHOLECYSTECTOMY    ? COLON RESECTION N/A 04/17/2018  ? Procedure: LAPAROSCOPIC COLON RESECTION POSSIBLE OSTOMY;  Surgeon: Benjamine Sprague, DO;  Location: [facility];  Service: General;  Laterality: N/A;  ? COLONOSCOPY WITH PROPOFOL N/A 07/23/2020  ? Procedure: COLONOSCOPY WITH PROPOFOL;  Surgeon: Tol[place],  Benay Pike, MD;  Location: [facility];  Service: Gastroenterology;  Laterality: N/A;  ? DIAGNOSTIC LAPAROSCOPY    ? ELBOW SURGERY Left   ? EYE SURGERY Right   ? glaucoma and stent surgery  ? FIDUCIAL MARKER PLACEMENT  08/05/2020  ? Procedure: FIDUCIAL MARKER PLACEMENT;  Surgeon: Garner Nash, DO;  Location: [place] [facility];  Service: Pulmonary;;  ? GANGLION CYST EXCISION Left 01/22/2016  ? base of thumb  ? GASTRIC BYPASS    ? GLAUCOMA SURGERY    ? GLAUCOMA SURGERY  12/13/2019  ? right eye  ? HERNIA REPAIR    ? INTERCOSTAL NERVE BLOCK Right 09/01/2020  ? Procedure: INTERCOSTAL NERVE BLOCK;  Surgeon: Lajuana Matte, MD;  Location: [place];  Service: Thoracic;  Laterality: Right;  ? LUNG REMOVAL,  PARTIAL    ? R upper lobe  ? neck fusion  07/04/2017  ? Dr. Carloyn Manner in [place]  ? NODE DISSECTION Right 09/01/2020  ? Procedure: NODE DISSECTION;  Surgeon: Lajuana Matte, MD;  Location: [employer] [facility];  Service: Thoracic;  Laterality: Right;  ? SPINE SURGERY    ? VIDEO BRONCHOSCOPY WITH ENDOBRONCHIAL NAVIGATION Right 08/05/2020  ? Procedure: VIDEO BRONCHOSCOPY WITH ENDOBRONCHIAL NAVIGATION;  Surgeon: Garner Nash, DO;  Location: [place];  Service: Pulmonary;  Laterality: Right;  ? WRIST SURGERY Right   ? ? ?Social History  ? ?Socioeconomic History  ? Marital status: Married  ?  Spouse name: Not on file  ? Number of children: 2  ? Years of education: college  ? Highest education level: Not on file  ?Occupational History  ?  Comment: retired  ?Tobacco Use  ? Smoking status: Former  ?  Packs/day: 0.50  ?  Years: 10.00  ?  Pack years: 5.00  ?  Types: Cigarettes  ?  Quit date: 93  ?  Years since quitting: 41.2  ? Smokeless tobacco: Never  ?Vaping Use  ? Vaping Use: Never used  ?Substance and Sexual Activity  ? Alcohol use: Not Currently  ?  Alcohol/week: 1.0 standard drink  ?  Types: 1 Cans of beer per week  ? Drug use: No  ? Sexual activity: Not on file  ?Other Topics Concern  ? Not on file  ?Social History Narrative  ?  Patient lives at home with her husband [name]).   ? Education two years of college.  ? Caffeine - two glasses of tea.  ? ?Social Determinants of Health  ? ?Financial Resource Strain: Not on file  ?Food Insecurity: Not on file  ?Transportation Needs: Not on file  ?Physical Activity: Not on file  ?Stress: Not on file  ?Social Connections: Not on file  ? ? ?Family History  ?Problem Relation Age of Onset  ? Diabetes Father   ? Stroke Father   ? Squamous cell carcinoma Mother   ? Migraines Daughter   ? Breast cancer Paternal Aunt   ? Allergic rhinitis Neg Hx   ? Angioedema Neg Hx   ? Asthma Neg Hx   ? Eczema Neg Hx   ? Immunodeficiency Neg Hx   ? Urticaria Neg Hx   ? ? ?Outpatient Encounter Medications as of 05/21/2021  ?Medication Sig  ? Ascorbic Acid (VITAMIN C PO) Take 1 mg by mouth 2 (two) times daily.  ? azelastine (ASTELIN) 0.1 % nasal spray Place 2 sprays into both nostrils daily.  ? CALCIUM CITRATE PO Take 1,000 mg by mouth at bedtime. Bariatric Advantage  ? Cholecalciferol (VITAMIN D3) 125 MCG (5000 UT) CAPS Take 5,000 Units by mouth daily.  ? Cyanocobalamin (VITAMIN B12) 1000 MCG TBCR Take 1,000 mcg by mouth in the morning.  ? cyproheptadine (PERIACTIN) 4 MG tablet Take 1 tablet (4 mg total) by mouth 2 (two) times daily.  ? dorzolamide-timolol (COSOPT) 22.3-6.8 MG/ML ophthalmic solution Place 1 drop into the right eye 2 (two) times daily.  ? eletriptan (RELPAX) 40 MG tablet Take 1 tablet (40 mg total) by mouth every 2 (two) hours as needed for migraine. 40 mg on set May repeat in 2 hours if headache persists or recurs. Not to exceed 2 tabs/24hours.  ? EPINEPHrine 0.3 mg/0.3 mL IJ SOAJ injection Inject 0.3 mg into the muscle as needed for anaphylaxis.  ? folic acid (FOLVITE) 1 MG tablet Take 1 mg by mouth in  the morning.  ? Fremanezumab-vfrm (AJOVY) 225 MG/1.5ML SOAJ Inject 225 mg into the skin every 30 (thirty) days.  ? gabapentin (NEURONTIN) 400 MG capsule Take 1 capsule (400 mg total) by mouth 2 (two) times  daily.  ? Krill Oil 350 MG CAPS Take 350 mg by mouth daily.  ? levothyroxine (SYNTHROID) 75 MCG tablet Take 1 tablet (75 mcg total) by mouth daily before breakfast.  ? Magnesium Oxide 400 MG CAPS Take 400 mg by mouth in the morning and at bedtime.  ? meclizine (ANTIVERT) 25 MG tablet Take by mouth.  ? Multiple Vitamin (MULTIVITAMIN) tablet Take 1 tablet by mouth 2 (two) times daily. Bariatric Advantage  ? nitrofurantoin, macrocrystal-monohydrate, (MACROBID) 100 MG capsule Take 100 mg by mouth 2 (two) times daily.  ? omeprazole (PRILOSEC) 20 MG capsule Take 1 capsule (20 mg total) by mouth in the morning and at bedtime.  ? prednisoLONE acetate (PRED FORTE) 1 % ophthalmic suspension Place 1 drop into the right eye daily.  ? riboflavin (VITAMIN B-2) 100 MG TABS tablet Take 100 mg by mouth in the morning and at bedtime.  ? solifenacin (VESICARE) 10 MG tablet Take 10 mg by mouth in the morning.  ? tiZANidine (ZANAFLEX) 4 MG capsule Take 4 mg by mouth 3 (three) times daily as needed for muscle spasms.  ? ?No facility-administered encounter medications on file as of 05/21/2021.  ? ? ?ALLERGIES: ?Allergies  ?Allergen Reactions  ? Diazepam   ?  Violent dreams  ? Sulfa Antibiotics Other (See Comments)  ?  Causes skin reaction ( on her mid back)  Pigmentation.  ?A fixed drug reaction  ? Tape Other (See Comments)  ?  Blister when pulled of or stays on too long  ? Latex Hives and Rash  ? Other Itching and Rash  ?  Metal contact allergy ?Blister  ? ?VACCINATION STATUS: ?Immunization History  ?Administered Date(s) Administered  ? Fluad Quad(high Dose 65+) 10/30/2018  ? Influenza Whole 12/09/2000, 12/11/2001, 03/20/2003  ? Influenza, High Dose Seasonal PF 12/17/2017  ? Influenza, Seasonal, Injecte, Preservative Fre 12/02/2009, 11/30/2010, 12/07/2011  ? Influenza,inj,Quad PF,6+ Mos 12/20/2006  ? Influenza,inj,quad, With Preservative 11/16/2015  ? Influenza-Unspecified 12/10/2005, 12/20/2006  ? Pneumococcal Conjugate-13 12/24/2017,  10/30/2018  ? Td 07/03/2003  ? Tdap 01/19/2011  ? Zoster Recombinat (Shingrix) 12/24/2017, 08/01/2018  ? ? ? ?HPI  ? ? ?Andee Poles Perry  is a patient with the above medical history.  ?During her last visit, she

## 2021-05-21 NOTE — Patient Instructions (Signed)

## 2021-05-27 ENCOUNTER — Ambulatory Visit (INDEPENDENT_AMBULATORY_CARE_PROVIDER_SITE_OTHER): Payer: Medicare Other

## 2021-05-27 DIAGNOSIS — J309 Allergic rhinitis, unspecified: Secondary | ICD-10-CM

## 2021-06-23 ENCOUNTER — Encounter: Payer: Self-pay | Admitting: Neurology

## 2021-06-23 ENCOUNTER — Ambulatory Visit (INDEPENDENT_AMBULATORY_CARE_PROVIDER_SITE_OTHER): Payer: Medicare Other | Admitting: Neurology

## 2021-06-23 VITALS — BP 117/83 | HR 71 | Ht 66.0 in | Wt 208.0 lb

## 2021-06-23 DIAGNOSIS — R202 Paresthesia of skin: Secondary | ICD-10-CM | POA: Diagnosis not present

## 2021-06-23 DIAGNOSIS — G43019 Migraine without aura, intractable, without status migrainosus: Secondary | ICD-10-CM | POA: Diagnosis not present

## 2021-06-23 MED ORDER — ELETRIPTAN HYDROBROMIDE 40 MG PO TABS
40.0000 mg | ORAL_TABLET | ORAL | 3 refills | Status: DC | PRN
Start: 2021-06-23 — End: 2022-06-29

## 2021-06-23 MED ORDER — GABAPENTIN 400 MG PO CAPS: 400.0000 mg | ORAL_CAPSULE | Freq: Two times a day (BID) | ORAL | 3 refills | Status: DC

## 2021-06-23 MED ORDER — AJOVY 225 MG/1.5ML ~~LOC~~ SOAJ: 225.0000 mg | SUBCUTANEOUS | 3 refills | Status: DC

## 2021-06-23 NOTE — Patient Instructions (Signed)
Continue current medications. 
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
## 2021-06-24 ENCOUNTER — Ambulatory Visit (INDEPENDENT_AMBULATORY_CARE_PROVIDER_SITE_OTHER): Payer: Medicare Other

## 2021-06-24 DIAGNOSIS — J309 Allergic rhinitis, unspecified: Secondary | ICD-10-CM

## 2021-07-01 ENCOUNTER — Ambulatory Visit (INDEPENDENT_AMBULATORY_CARE_PROVIDER_SITE_OTHER): Payer: Medicare Other

## 2021-07-01 DIAGNOSIS — J309 Allergic rhinitis, unspecified: Secondary | ICD-10-CM | POA: Diagnosis not present

## 2021-07-31 ENCOUNTER — Ambulatory Visit (INDEPENDENT_AMBULATORY_CARE_PROVIDER_SITE_OTHER): Payer: Medicare Other

## 2021-07-31 DIAGNOSIS — J309 Allergic rhinitis, unspecified: Secondary | ICD-10-CM

## 2021-08-10 DIAGNOSIS — J3081 Allergic rhinitis due to animal (cat) (dog) hair and dander: Secondary | ICD-10-CM

## 2021-08-11 DIAGNOSIS — J3089 Other allergic rhinitis: Secondary | ICD-10-CM | POA: Diagnosis not present

## 2021-08-12 ENCOUNTER — Other Ambulatory Visit: Payer: Self-pay | Admitting: "Endocrinology

## 2021-08-26 ENCOUNTER — Ambulatory Visit (INDEPENDENT_AMBULATORY_CARE_PROVIDER_SITE_OTHER): Payer: Medicare Other

## 2021-08-26 DIAGNOSIS — J309 Allergic rhinitis, unspecified: Secondary | ICD-10-CM | POA: Diagnosis not present

## 2021-09-15 ENCOUNTER — Telehealth: Payer: Self-pay | Admitting: *Deleted

## 2021-09-15 NOTE — Telephone Encounter (Signed)
Patient needs to changer her 9/11 appointment.

## 2021-09-23 ENCOUNTER — Ambulatory Visit (INDEPENDENT_AMBULATORY_CARE_PROVIDER_SITE_OTHER): Payer: Medicare Other

## 2021-09-23 DIAGNOSIS — J309 Allergic rhinitis, unspecified: Secondary | ICD-10-CM | POA: Diagnosis not present

## 2021-10-13 HISTORY — PX: COLONOSCOPY: SHX174

## 2021-10-21 ENCOUNTER — Ambulatory Visit (INDEPENDENT_AMBULATORY_CARE_PROVIDER_SITE_OTHER): Payer: Medicare Other

## 2021-10-21 DIAGNOSIS — J309 Allergic rhinitis, unspecified: Secondary | ICD-10-CM

## 2021-10-24 IMAGING — CT CT CHEST SUPER D W/O CM
2 of 4 series · 15 of 36 positions shown, 18 images · non-contrast
Comparison: PET-CT 06/27/2020 and prior super D chest CT 06/13/2020

CLINICAL DATA: Preop for EMB and lung biopsy.

EXAM:
CT CHEST WITHOUT CONTRAST
TECHNIQUE: Multidetector CT imaging of the chest was performed using thin slice
collimation for electromagnetic bronchoscopy planning purposes,
without intravenous contrast.

[Series 2: chest 2.00 br40 s3 · axial · 0.73mm/px · z∈[+1565,+1809]mm · 12 of 146 slices shown, 15 images (1 of 2)]
[im 12/146  mediastinal]
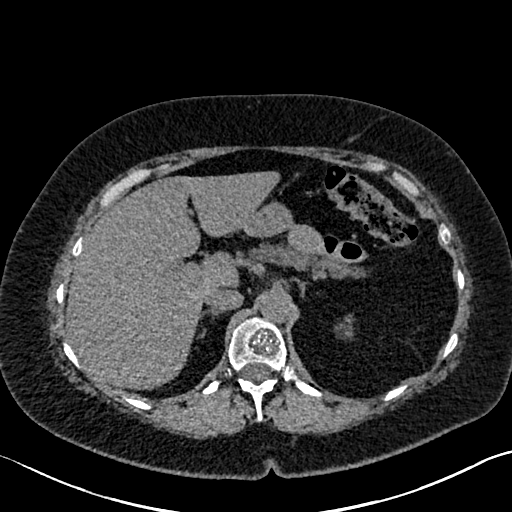
[im 12/146  lung]
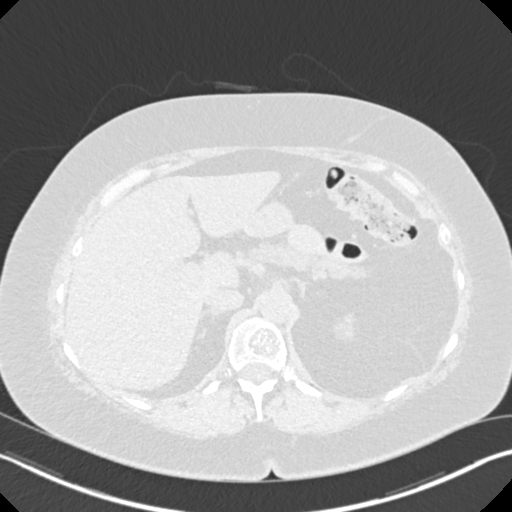
[im 23/146  lung]
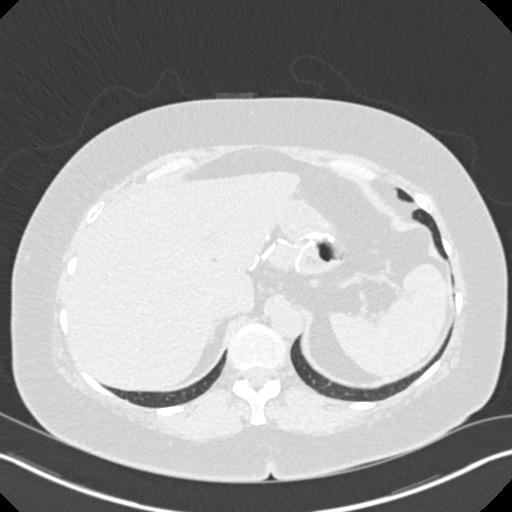
[im 34/146  lung]
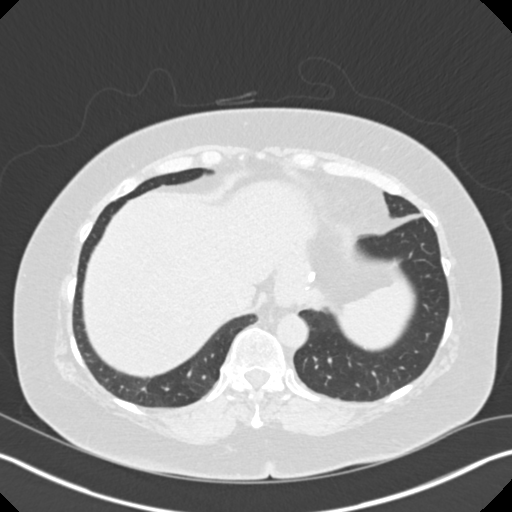
[im 45/146  lung]
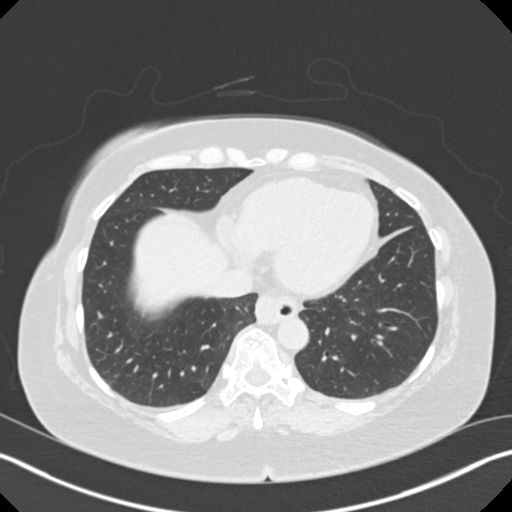
[im 56/146  mediastinal]
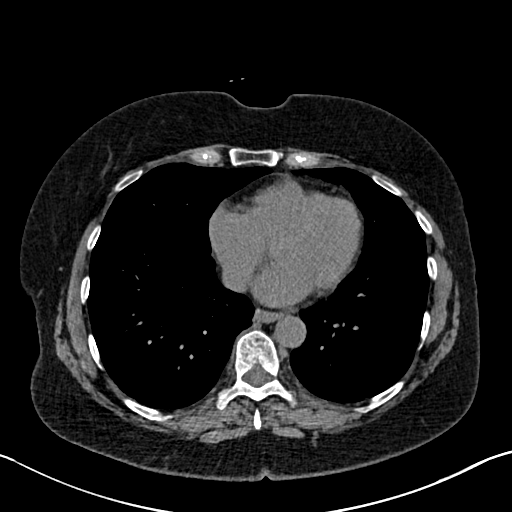
[im 56/146  lung]
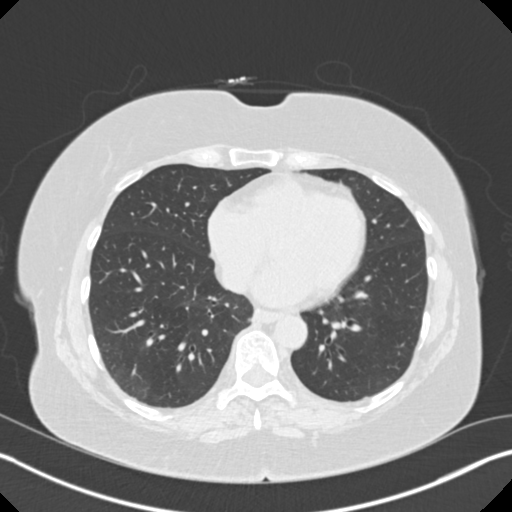
[im 67/146  lung]
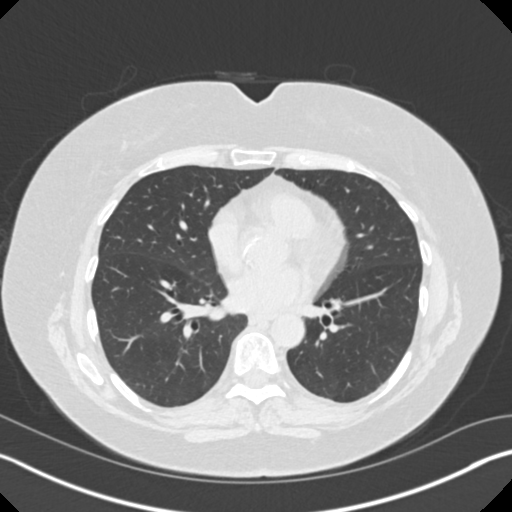
[im 79/146  lung]
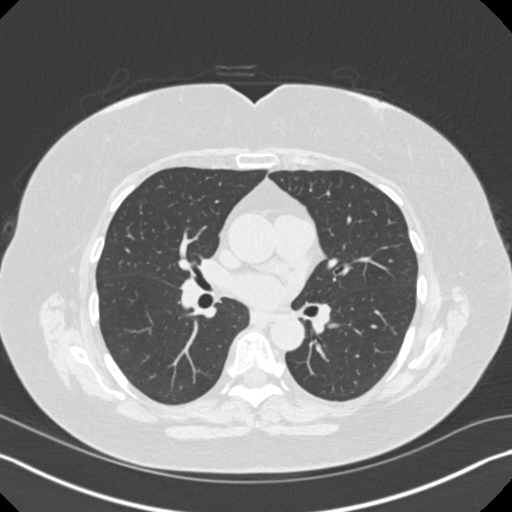
[im 90/146  lung]
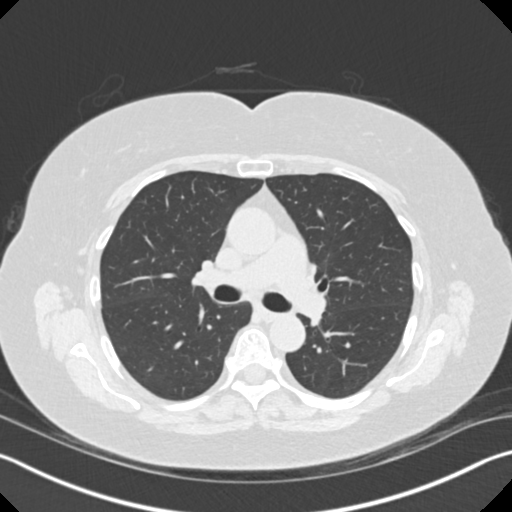
[im 101/146  mediastinal]
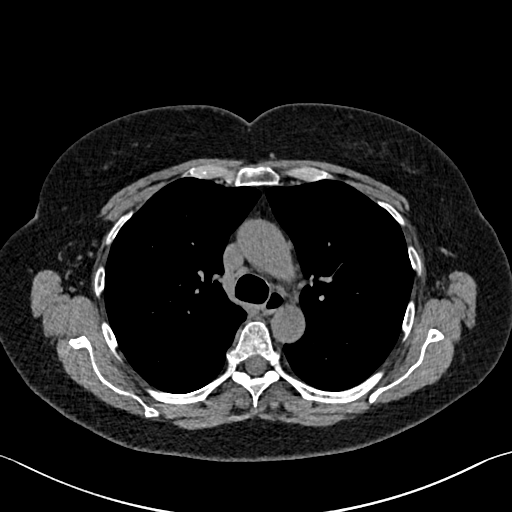
[im 101/146  lung]
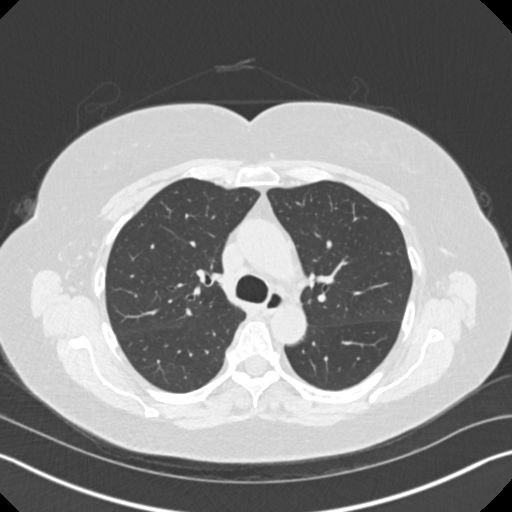
[im 112/146  lung]
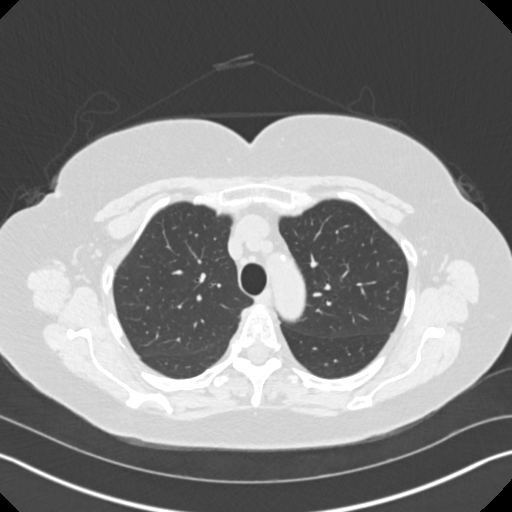
[im 123/146  lung]
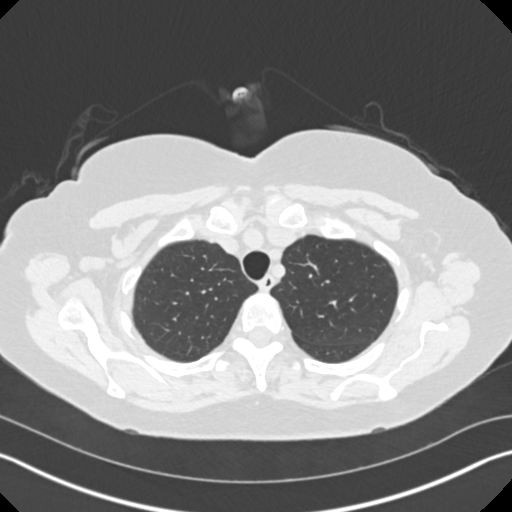
[im 134/146  lung]
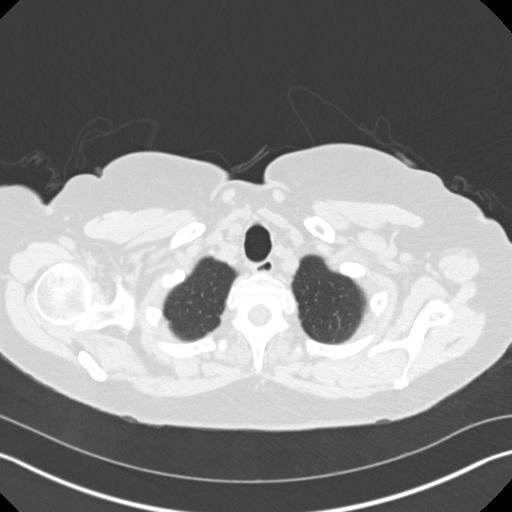

[Series 4: chest 2.00 br40 s3 · coronal · 0.57mm/px · 3 of 186 slices shown (2 of 2)]
[im 38/186  lung]
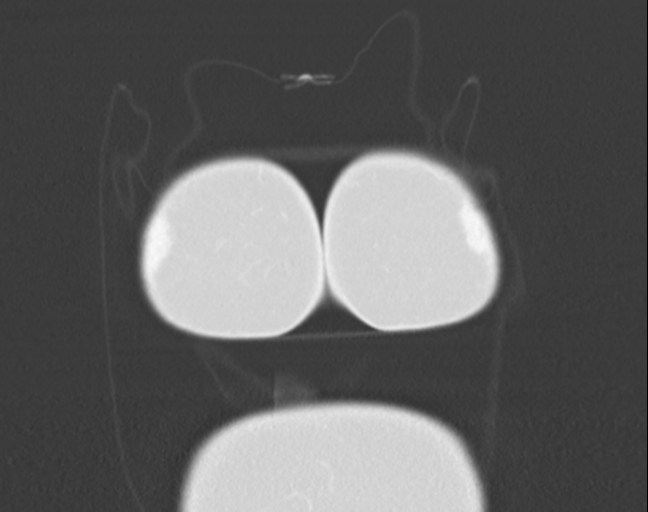
[im 75/186  lung]
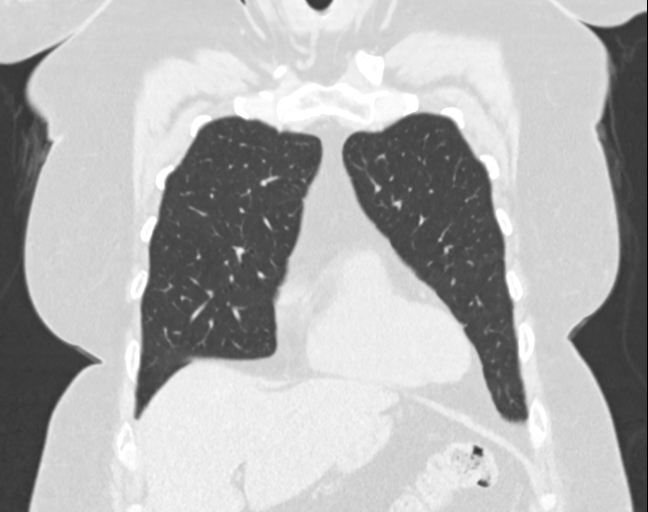
[im 112/186  lung]
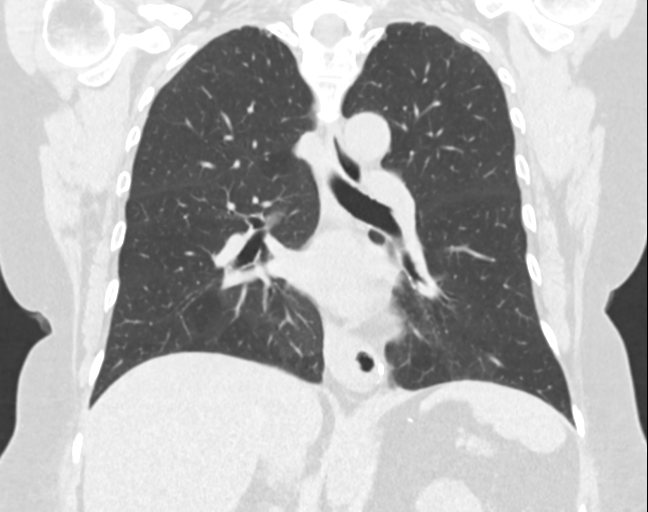

[15 of 36 positions shown; findings below may reference images not displayed]

FINDINGS: Cardiovascular: The heart is normal in size. No pericardial
effusion. Stable mild tortuosity and calcification of the thoracic
aorta. Stable calcifications near the aortic valve and stable
scattered coronary artery calcifications.

Mediastinum/Nodes: No mediastinal or hilar mass or adenopathy. Small
scattered lymph nodes are stable. The esophagus is grossly normal.
There is a small hiatal hernia. Stable 11 mm right thyroid nodule.
Not clinically significant; no follow-up imaging recommended (ref: [HOSPITAL]. [DATE]): 143-50).

Lungs/Pleura: The lobulated right upper lobe pulmonary nodule on
image 61/8 measures a maximum of 9 mm and has definitely enlarged
since the prior CT scan from 06/13/2020. This was weakly
hypermetabolic on the recent PET-CT and is suspicious for a small
primary lung neoplasm.

No other pulmonary lesions are identified. Stable mild underlying
emphysematous changes. No pleural effusions or pleural nodules.

Upper Abdomen: No significant upper abdominal findings. No mass or
adenopathy. Stable surgical changes from gastric bypass surgery and
stable surgical changes involving the left kidney. The gallbladder
is surgically absent.

Musculoskeletal: No breast masses, supraclavicular or axillary
adenopathy. The bony thorax is intact. Stable T12 hemangioma.
IMPRESSION: 1. 9 mm right upper lobe pulmonary nodule has definitely enlarged
since the prior CT scan from 06/13/2020. This was weakly
hypermetabolic on the recent PET-CT and is suspicious for a small
primary lung neoplasm.
2. No mediastinal or hilar mass or adenopathy.
3. Stable mild underlying emphysematous changes.
4. Stable surgical changes from gastric bypass surgery and surgical
changes involving the left kidney.
5. Emphysema and aortic atherosclerosis.

Aortic Atherosclerosis (428XG-6R8.8) and Emphysema (428XG-9SL.L).

## 2021-10-27 ENCOUNTER — Inpatient Hospital Stay: Payer: Medicare Other | Attending: Oncology

## 2021-10-27 DIAGNOSIS — C3411 Malignant neoplasm of upper lobe, right bronchus or lung: Secondary | ICD-10-CM | POA: Insufficient documentation

## 2021-10-27 DIAGNOSIS — C19 Malignant neoplasm of rectosigmoid junction: Secondary | ICD-10-CM | POA: Diagnosis not present

## 2021-10-27 DIAGNOSIS — Z79899 Other long term (current) drug therapy: Secondary | ICD-10-CM | POA: Insufficient documentation

## 2021-10-27 DIAGNOSIS — C187 Malignant neoplasm of sigmoid colon: Secondary | ICD-10-CM

## 2021-10-27 LAB — COMPREHENSIVE METABOLIC PANEL
ALT: 40 U/L (ref 0–44)
AST: 49 U/L — ABNORMAL HIGH (ref 15–41)
Albumin: 3.9 g/dL (ref 3.5–5.0)
Alkaline Phosphatase: 110 U/L (ref 38–126)
Anion gap: 6 (ref 5–15)
BUN: 14 mg/dL (ref 8–23)
CO2: 24 mmol/L (ref 22–32)
Calcium: 8.6 mg/dL — ABNORMAL LOW (ref 8.9–10.3)
Chloride: 104 mmol/L (ref 98–111)
Creatinine, Ser: 0.74 mg/dL (ref 0.44–1.00)
GFR, Estimated: >60 mL/min (ref >60)
Glucose, Bld: 114 mg/dL — ABNORMAL HIGH (ref 70–99)
Potassium: 4.2 mmol/L (ref 3.5–5.1)
Sodium: 134 mmol/L — ABNORMAL LOW (ref 135–145)
Total Bilirubin: 0.7 mg/dL (ref 0.3–1.2)
Total Protein: 7.1 g/dL (ref 6.5–8.1)

## 2021-10-27 LAB — CBC WITH DIFFERENTIAL/PLATELET
Abs Immature Granulocytes: 0.05 10*3/uL (ref 0.00–0.07)
Basophils Absolute: 0.1 10*3/uL (ref 0.0–0.1)
Basophils Relative: 1 %
Eosinophils Absolute: 0.2 10*3/uL (ref 0.0–0.5)
Eosinophils Relative: 2 %
HCT: 36 % (ref 36.0–46.0)
Hemoglobin: 11.3 g/dL — ABNORMAL LOW (ref 12.0–15.0)
Immature Granulocytes: 1 %
Lymphocytes Relative: 27 %
Lymphs Abs: 2.2 10*3/uL (ref 0.7–4.0)
MCH: 26.2 pg (ref 26.0–34.0)
MCHC: 31.4 g/dL (ref 30.0–36.0)
MCV: 83.3 fL (ref 80.0–100.0)
Monocytes Absolute: 0.9 10*3/uL (ref 0.1–1.0)
Monocytes Relative: 11 %
Neutro Abs: 4.9 10*3/uL (ref 1.7–7.7)
Neutrophils Relative %: 58 %
Platelets: 296 10*3/uL (ref 150–400)
RBC: 4.32 MIL/uL (ref 3.87–5.11)
RDW: 15.9 % — ABNORMAL HIGH (ref 11.5–15.5)
WBC: 8.3 10*3/uL (ref 4.0–10.5)
nRBC: 0 % (ref 0.0–0.2)

## 2021-10-28 LAB — CEA: CEA: 2.3 ng/mL (ref 0.0–4.7)

## 2021-10-29 ENCOUNTER — Ambulatory Visit: Payer: Medicare Other | Admitting: Oncology

## 2021-10-30 ENCOUNTER — Inpatient Hospital Stay (HOSPITAL_BASED_OUTPATIENT_CLINIC_OR_DEPARTMENT_OTHER): Payer: Medicare Other | Admitting: Oncology

## 2021-10-30 ENCOUNTER — Encounter: Payer: Self-pay | Admitting: Oncology

## 2021-10-30 VITALS — BP 112/86 | HR 88 | Temp 97.3°F | Resp 18 | Wt 216.8 lb

## 2021-10-30 DIAGNOSIS — C3491 Malignant neoplasm of unspecified part of right bronchus or lung: Secondary | ICD-10-CM

## 2021-10-30 DIAGNOSIS — D500 Iron deficiency anemia secondary to blood loss (chronic): Secondary | ICD-10-CM | POA: Diagnosis not present

## 2021-10-30 DIAGNOSIS — C19 Malignant neoplasm of rectosigmoid junction: Secondary | ICD-10-CM | POA: Diagnosis not present

## 2021-10-30 DIAGNOSIS — C187 Malignant neoplasm of sigmoid colon: Secondary | ICD-10-CM | POA: Diagnosis not present

## 2021-10-30 NOTE — Progress Notes (Signed)
Hematology/Oncology Progress note [phone] [phone]      Patient Care Team: Ludwig Clarks, FNP as PCP - General (Family Medicine) Dorthula Rue., MD as Referring Physician (Otolaryngology) Clent Jacks, RN as Registered Nurse  REFERRING PROVIDER: Dr.Toledo / Gerarda Gunther 'CHIEF COMPLAINTS/REASON FOR VISIT:  Follow up Stage IIA colorectal cancer, stage I right upper lung cancer  HISTORY OF PRESENTING ILLNESS:  Jordan Mahoney is a  71 y.o.  female with PMH listed below who was referred to me for evaluation of stage IIA colorectal cancer. Patient was initially seen care physician for evaluation of fatigue.  Lab work-up showed that patient's anemic.  Hemoccult was obtained and was positive.  Patient was referred to [facility] and was seen and evaluated by Dr. Alice Reichert.  Reports that she had 2-3 bowel movement daily sometimes mushy and sometimes formed.  Denies seeing any bright red blood in the stool.  Denies any associated abdominal pain, gastric discomfort.  Appetite seems to be stable.  History of gastric bypass in 2010.  Cholecystectomy in 2000.  ?Unintentional weight loss 5 to 6 pounds for the past 2 months.   prior records showed she weighs 149 pounds on 09/28/2017, weighed 160 pounds 05/03/2017 Colonoscopy was obtained on 03/27/2018.  6 mm polyp found in sigmoid colon.  Polyp is sessile.An ulcerated partially obstructing mass was found in the rectosigmoid colon.  Mass was circumferential.  Measured 5 cm in length.  No bleeding was present.  Biopsy was taken.  Nonbleeding internal hemorrhoids were found during retroflexion.  #04/17/2018 underwent surgery.  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 No loss of nuclear expression of MMR proteins: Low probability of MSI-H.  Stage IIA, No adjuvant chemotherapy was not  Offered  # Stage ! Lung cancer.   Slow-growing lung nodule in the right upper lobe.  Case on 05/01/2020 tumor board.Consensus reached upon referring patient to pulmonology Dr. Patsey Berthold for biopsy via bronchoscopy. 06/27/2020, PET scan shows no distant metastasis.  Right upper lobe pulm nodule SUV 1.3.  Asymmetric palatine tonsilla activity probably physiologic.  Lack of CT correlate. 08/05/2020 right upper lobe fine-needle aspiration the bronchoscopy showed malignant cells consistent with non-small cell carcinoma.  09/01/2020, patient underwent right upper lobe lobectomy with lymph node dissection.  No LVI, pT1b pN0   INTERVAL HISTORY Jordan Mahoney is a 71 y.o. female who has above history reviewed by me today presents for follow up visit for management of Stage IIA colorectal cancer and Stage ! Lung cancer.   Patient reports feeling well. She has good appetite and no unintentional weight loss, hemoptysis, shortness of breath, blood in the stool, abdominal pain.   No new compliants  Review of Systems  Constitutional:  Negative for appetite change, chills, fatigue and fever.  HENT:   Negative for hearing loss and voice change.   Eyes:  Negative for eye problems.  Respiratory:  Negative for chest tightness and cough.   Cardiovascular:  Negative for chest pain.  Gastrointestinal:  Negative for abdominal distention, abdominal pain and blood in stool.  Endocrine: Negative for hot flashes.  Genitourinary:  Negative for difficulty urinating and frequency.   Musculoskeletal:  Negative for arthralgias.  Skin:  Negative for itching and rash.  Neurological:  Negative for extremity weakness.  Hematological:  Negative for adenopathy.  Psychiatric/Behavioral:  Negative for confusion.        MEDICAL HISTORY:  Past Medical History:  Diagnosis Date   Allergic rhinoconjunctivitis    Allergies    Arthritis    Back pain    Chronic uveitis    Depression    Frequent UTI    GERD (gastroesophageal reflux disease)    HA (headache)     Hearing loss    wearing bilateral aides   History of hiatal hernia    Hypothyroidism    Lactose intolerance    Migraine    Osteopenia    Pre-diabetes    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HCC)    Sensorineural hearing loss    Tingling of both feet    Tinnitus     SURGICAL HISTORY: Past Surgical History:  Procedure Laterality Date   ABDOMINAL HYSTERECTOMY     belly button     belly button hernia   BRONCHIAL BIOPSY  08/05/2020   Procedure: BRONCHIAL BIOPSIES;  Surgeon: Garner Nash, DO;  Location: [facility];  Service: Pulmonary;;   BRONCHIAL BRUSHINGS  08/05/2020   Procedure: BRONCHIAL BRUSHINGS;  Surgeon: Garner Nash, DO;  Location: [facility];  Service: Pulmonary;;   BRONCHIAL NEEDLE ASPIRATION BIOPSY  08/05/2020   Procedure: BRONCHIAL NEEDLE ASPIRATION BIOPSIES;  Surgeon: Garner Nash, DO;  Location: [facility];  Service: Pulmonary;;   BRONCHIAL WASHINGS  08/05/2020   Procedure: BRONCHIAL WASHINGS;  Surgeon: Garner Nash, DO;  Location: [place];  Service: Pulmonary;;   c sections     CATARACT EXTRACTION Right    CHOLECYSTECTOMY     COLON RESECTION N/A 04/17/2018   Procedure: LAPAROSCOPIC COLON RESECTION POSSIBLE OSTOMY;  Surgeon: Benjamine Sprague, DO;  Location: [facility];  Service: General;  Laterality: N/A;   COLONOSCOPY WITH PROPOFOL N/A 07/23/2020   Procedure: COLONOSCOPY WITH PROPOFOL;  Surgeon: Toledo, Benay Pike, MD;  Location: [facility];  Service: Gastroenterology;  Laterality: N/A;   DIAGNOSTIC LAPAROSCOPY     ELBOW SURGERY Left    EYE SURGERY Right    glaucoma and stent surgery   FIDUCIAL MARKER PLACEMENT  08/05/2020   Procedure: FIDUCIAL MARKER PLACEMENT;  Surgeon: Garner Nash, DO;  Location: [facility];  Service: Pulmonary;;   GANGLION CYST EXCISION Left 01/22/2016   base of thumb   GASTRIC BYPASS     GLAUCOMA SURGERY     GLAUCOMA SURGERY  12/13/2019   right eye   HERNIA REPAIR     INTERCOSTAL NERVE BLOCK Right 09/01/2020    Procedure: INTERCOSTAL NERVE BLOCK;  Surgeon: Lajuana Matte, MD;  Location: [facility];  Service: Thoracic;  Laterality: Right;   LUNG REMOVAL, PARTIAL     R upper lobe   neck fusion  07/04/2017   Dr. Carloyn Manner in [place] Right 09/01/2020   Procedure: NODE DISSECTION;  Surgeon: Lajuana Matte, MD;  Location: [place] [facility];  Service: Thoracic;  Laterality: Right;   [place] Right 08/05/2020   Procedure: VIDEO BRONCHOSCOPY WITH ENDOBRONCHIAL NAVIGATION;  Surgeon: Garner Nash, DO;  Location: [facility];  Service: Pulmonary;  Laterality: Right;   WRIST SURGERY Right     SOCIAL HISTORY: Social History   Socioeconomic History   Marital status: Married    Spouse name: Not on file   Number of children: 2   Years of education: college   Highest education level: Not on file  Occupational History    Comment: retired  Tobacco Use   Smoking status: Former    Packs/day: 0.50    Years: 10.00  Total pack years: 5.00    Types: Cigarettes    Quit date: 42    Years since quitting: 41.7   Smokeless tobacco: Never  Vaping Use   Vaping Use: Never used  Substance and Sexual Activity   Alcohol use: Not Currently    Alcohol/week: 1.0 standard drink of alcohol    Types: 1 Cans of beer per week   Drug use: No   Sexual activity: Not on file  Other Topics Concern   Not on file  Social History Narrative   Patient lives at home with her husband [name]).    Education two years of college.   Caffeine - two glasses of tea.   Social Determinants of Health   Financial Resource Strain: Not on file  Food Insecurity: Not on file  Transportation Needs: Not on file  Physical Activity: Not on file  Stress: Not on file  Social Connections: Not on file  Intimate Partner Violence: Not on file    FAMILY HISTORY: Family History  Problem Relation Age of Onset   Diabetes Father    Stroke Father    Squamous cell carcinoma  Mother    Migraines Daughter    Breast cancer Paternal Aunt    Allergic rhinitis Neg Hx    Angioedema Neg Hx    Asthma Neg Hx    Eczema Neg Hx    Immunodeficiency Neg Hx    Urticaria Neg Hx     ALLERGIES:  is allergic to diazepam, sulfa antibiotics, tape, latex, and other.  MEDICATIONS:  Current Outpatient Medications  Medication Sig Dispense Refill   Ascorbic Acid (VITAMIN C PO) Take 1 mg by mouth 2 (two) times daily.     azelastine (ASTELIN) 0.1 % nasal spray Place 2 sprays into both nostrils daily. 90 mL 3   CALCIUM CITRATE PO Take 1,000 mg by mouth at bedtime. Bariatric Advantage     Cholecalciferol (VITAMIN D3) 125 MCG (5000 UT) CAPS Take 5,000 Units by mouth daily.     Cyanocobalamin (VITAMIN B12) 1000 MCG TBCR Take 1,000 mcg by mouth in the morning.     cyproheptadine (PERIACTIN) 4 MG tablet Take 1 tablet (4 mg total) by mouth 2 (two) times daily. 180 tablet 3   dorzolamide-timolol (COSOPT) 22.3-6.8 MG/ML ophthalmic solution Place 1 drop into the right eye 2 (two) times daily.     eletriptan (RELPAX) 40 MG tablet Take 1 tablet (40 mg total) by mouth every 2 (two) hours as needed for migraine. 40 mg on set May repeat in 2 hours if headache persists or recurs. Not to exceed 2 tabs/24hours. 30 tablet 3   folic acid (FOLVITE) 1 MG tablet Take 1 mg by mouth in the morning.     Fremanezumab-vfrm (AJOVY) 225 MG/1.5ML SOAJ Inject 225 mg into the skin every 30 (thirty) days. 4.5 mL 3   gabapentin (NEURONTIN) 400 MG capsule Take 1 capsule (400 mg total) by mouth 2 (two) times daily. 180 capsule 3   Krill Oil 350 MG CAPS Take 350 mg by mouth daily.     levothyroxine (SYNTHROID) 75 MCG tablet TAKE 1 TABLET DAILY BEFORE BREAKFAST 90 tablet 0   Magnesium Oxide 400 MG CAPS Take 400 mg by mouth in the morning and at bedtime.     meclizine (ANTIVERT) 25 MG tablet Take by mouth.     Multiple Vitamin (MULTIVITAMIN) tablet Take 1 tablet by mouth 2 (two) times daily. Bariatric Advantage      omeprazole (PRILOSEC) 20 MG capsule Take  1 capsule (20 mg total) by mouth in the morning and at bedtime. 60 capsule 5   prednisoLONE acetate (PRED FORTE) 1 % ophthalmic suspension Place 1 drop into the right eye daily.     Probiotic Product (PROBIOTIC DAILY PO) Take by mouth.     riboflavin (VITAMIN B-2) 100 MG TABS tablet Take 100 mg by mouth in the morning and at bedtime.     solifenacin (VESICARE) 10 MG tablet Take 10 mg by mouth in the morning.     tiZANidine (ZANAFLEX) 4 MG capsule Take 4 mg by mouth 3 (three) times daily as needed for muscle spasms.     EPINEPHrine 0.3 mg/0.3 mL IJ SOAJ injection Inject 0.3 mg into the muscle as needed for anaphylaxis. (Patient not taking: Reported on 10/30/2021) 1 each 1   No current facility-administered medications for this visit.     PHYSICAL EXAMINATION: ECOG PERFORMANCE STATUS: 1 - Symptomatic but completely ambulatory Vitals:   10/30/21 1142  BP: 112/86  Pulse: 88  Resp: 18  Temp: (!) 97.3 F (36.3 C)   Filed Weights   10/30/21 1142  Weight: 216 lb 12.8 oz (98.3 kg)    Physical Exam Constitutional:      General: She is not in acute distress.    Appearance: She is not diaphoretic.  HENT:     Head: Normocephalic.     Nose: Nose normal.     Mouth/Throat:     Pharynx: No oropharyngeal exudate.  Eyes:     General: No scleral icterus.    Pupils: Pupils are equal, round, and reactive to light.  Cardiovascular:     Rate and Rhythm: Normal rate.     Heart sounds: No murmur heard. Pulmonary:     Effort: Pulmonary effort is normal. No respiratory distress.     Breath sounds: No wheezing.  Abdominal:     General: There is no distension.  Musculoskeletal:        General: Normal range of motion.     Cervical back: Normal range of motion.  Skin:    General: Skin is warm and dry.  Neurological:     Mental Status: She is alert and oriented to person, place, and time.     Cranial Nerves: No cranial nerve deficit.     Motor: No  abnormal muscle tone.     Coordination: Coordination normal.  Psychiatric:        Mood and Affect: Mood and affect normal.      RADIOGRAPHIC STUDIES: I have personally reviewed the radiological images as listed and agreed with the findings in the report. No results found.     LABORATORY DATA:  I have reviewed the data as listed    Latest Ref Rng & Units 10/27/2021   10:47 AM 04/07/2021   10:53 AM 11/03/2020   10:26 AM  CBC  WBC 4.0 - 10.5 K/uL 8.3  7.7  7.3   Hemoglobin 12.0 - 15.0 g/dL 11.3  12.1  12.1   Hematocrit 36.0 - 46.0 % 36.0  39.4  38.5   Platelets 150 - 400 K/uL 296  302  282       Latest Ref Rng & Units 10/27/2021   10:47 AM 04/07/2021   10:53 AM 11/03/2020   10:26 AM  CMP  Glucose 70 - 99 mg/dL 114  106  119   BUN 8 - 23 mg/dL _0 Creatinine 0.44 - 1.00 mg/dL 0.74  0.71  0.75  Sodium 135 - 145 mmol/L 134  137  140   Potassium 3.5 - 5.1 mmol/L 4.2  3.9  3.7   Chloride 98 - 111 mmol/L 104  105  105   CO2 22 - 32 mmol/L _0 Calcium 8.9 - 10.3 mg/dL 8.6  8.5  8.5   Total Protein 6.5 - 8.1 g/dL 7.1  7.1  7.3   Total Bilirubin 0.3 - 1.2 mg/dL 0.7  0.8  0.8   Alkaline Phos 38 - 126 U/L 110  102  104   AST 15 - 41 U/L 49  36  32   ALT 0 - 44 U/L 40  27  25    Preop CEA was obtained on 04/03/2018, at 1.8  ASSESSMENT & PLAN:  1. Adenocarcinoma of sigmoid colon ([place])   2. Primary lung adenocarcinoma, right ([place])   3. Iron deficiency anemia due to chronic blood loss    Cancer Staging  Adenocarcinoma of sigmoid colon (HCC) Staging form: Colon and Rectum, AJCC 8th Edition - Clinical: No stage assigned - Unsigned - Pathologic stage from 05/04/2018: Stage IIA (pT3, pN0, cM0) - Signed by Earlie Server, MD on 05/04/2018  # Stage IIA sigmoid colon -2020 Clinically she is doing very well. CEA stable. Continue annual CT abdomen pelvis surveillance.   #Stage I right lung adenocarcinoma, status post lobectomy. -09/01/20 Recommend every 6 month CT  surveillance. Obtain CT chest contrast Labs reviewed and discussed with patient.  Follow-up 6 months with lab MD CBC CMP CEA after surveillance CT scan. Orders Placed This Encounter  Procedures   CT Chest W Contrast    Standing Status:   Future    Standing Expiration Date:   10/30/2022    Order Specific Question:   If indicated for the ordered procedure, I authorize the administration of contrast media per Radiology protocol    Answer:   Yes    Order Specific Question:   Preferred imaging location?    Answer:   [facility]   CT CHEST ABDOMEN PELVIS W CONTRAST    Standing Status:   Future    Standing Expiration Date:   10/31/2022    Order Specific Question:   Preferred imaging location?    Answer:   [facility]    Order Specific Question:   Is Oral Contrast requested for this exam?    Answer:   Yes, Per Radiology protocol   CBC with Differential/Platelet    Standing Status:   Future    Standing Expiration Date:   10/31/2022   Comprehensive metabolic panel    Standing Status:   Future    Standing Expiration Date:   10/30/2022   Iron and TIBC    Standing Status:   Future    Standing Expiration Date:   10/31/2022   Ferritin    Standing Status:   Future    Standing Expiration Date:   10/31/2022   CEA    Standing Status:   Future    Standing Expiration Date:   10/30/2022    We spent sufficient time to discuss many aspect of care, questions were answered to patient's satisfaction.  Earlie Server, MD, PhD  10/30/2021

## 2021-11-09 ENCOUNTER — Other Ambulatory Visit: Payer: Self-pay | Admitting: "Endocrinology

## 2021-11-11 ENCOUNTER — Ambulatory Visit
Admission: RE | Admit: 2021-11-11 | Discharge: 2021-11-11 | Disposition: A | Discharge status: Home / Self Care | Payer: Medicare Other | Source: Ambulatory Visit | Attending: Oncology | Admitting: Oncology

## 2021-11-11 DIAGNOSIS — C3491 Malignant neoplasm of unspecified part of right bronchus or lung: Secondary | ICD-10-CM | POA: Diagnosis present

## 2021-11-11 MED ORDER — IOHEXOL 300 MG/ML  SOLN
75.0000 mL | Freq: Once | INTRAMUSCULAR | Status: AC | PRN
  Administered 2021-11-11: 75 mL via INTRAVENOUS

## 2021-11-17 LAB — LIPID PANEL
Chol/HDL Ratio: 3.9 ratio (ref 0.0–4.4)
Cholesterol, Total: 197 mg/dL (ref 100–199)
HDL: 51 mg/dL (ref >39)
LDL Chol Calc (NIH): 115 mg/dL — ABNORMAL HIGH (ref 0–99)
Triglycerides: 178 mg/dL — ABNORMAL HIGH (ref 0–149)
VLDL Cholesterol Cal: 31 mg/dL (ref 5–40)

## 2021-11-17 LAB — T4, FREE: Free T4: 1.07 ng/dL (ref 0.82–1.77)

## 2021-11-17 LAB — TSH: TSH: 1.61 u[IU]/mL (ref 0.450–4.500)

## 2021-11-18 ENCOUNTER — Ambulatory Visit (INDEPENDENT_AMBULATORY_CARE_PROVIDER_SITE_OTHER): Payer: Medicare Other

## 2021-11-18 DIAGNOSIS — J309 Allergic rhinitis, unspecified: Secondary | ICD-10-CM | POA: Diagnosis not present

## 2021-11-20 ENCOUNTER — Encounter: Payer: Self-pay | Admitting: "Endocrinology

## 2021-11-20 ENCOUNTER — Ambulatory Visit (INDEPENDENT_AMBULATORY_CARE_PROVIDER_SITE_OTHER): Payer: Medicare Other | Admitting: "Endocrinology

## 2021-11-20 VITALS — BP 112/78 | HR 64 | Ht 66.0 in | Wt 219.8 lb

## 2021-11-20 DIAGNOSIS — E038 Other specified hypothyroidism: Secondary | ICD-10-CM

## 2021-11-20 DIAGNOSIS — E782 Mixed hyperlipidemia: Secondary | ICD-10-CM | POA: Diagnosis not present

## 2021-11-20 DIAGNOSIS — E063 Autoimmune thyroiditis: Secondary | ICD-10-CM

## 2021-11-20 DIAGNOSIS — E119 Type 2 diabetes mellitus without complications: Secondary | ICD-10-CM

## 2021-11-20 LAB — POCT GLYCOSYLATED HEMOGLOBIN (HGB A1C): HbA1c, POC (controlled diabetic range): 7 % (ref 0.0–7.0)

## 2021-11-20 MED ORDER — LEVOTHYROXINE SODIUM 75 MCG PO TABS: 75.0000 ug | ORAL_TABLET | Freq: Every day | ORAL | 1 refills | Status: AC

## 2021-11-20 NOTE — Patient Instructions (Signed)
                                     Advice for Weight Management  -For most of us the best way to lose weight is by diet management. Generally speaking, diet management means consuming less calories intentionally which over time brings about progressive weight loss.  This can be achieved more effectively by avoiding ultra processed carbohydrates, processed meats, unhealthy fats.    It is critically important to know your numbers: how much calorie you are consuming and how much calorie you need. More importantly, our carbohydrates sources should be unprocessed naturally occurring  complex starch food items.  It is always important to balance nutrition also by  appropriate intake of proteins (mainly plant-based), healthy fats/oils, plenty of fruits and vegetables.   -The American College of Lifestyle Medicine (ACL M) recommends nutrition derived mostly from Whole Food, Plant Predominant Sources example an apple instead of applesauce or apple pie. Eat Plenty of vegetables, Mushrooms, fruits, Legumes, Whole Grains, Nuts, seeds in lieu of processed meats, processed snacks/pastries red meat, poultry, eggs.  Use only water or unsweetened tea for hydration.  The College also recommends the need to stay away from risky substances including alcohol, smoking; obtaining 7-9 hours of restorative sleep, at least 150 minutes of moderate intensity exercise weekly, importance of healthy social connections, and being mindful of stress and seek help when it is overwhelming.    -Sticking to a routine mealtime to eat 3 meals a day and avoiding unnecessary snacks is shown to have a big role in weight control. Under normal circumstances, the only time we burn stored energy is when we are hungry, so allow  some hunger to take place- hunger means no food between appropriate meal times, only water.  It is not advisable to starve.   -It is better to avoid simple carbohydrates including:  Cakes, Sweet Desserts, Ice Cream, Soda (diet and regular), Sweet Tea, Candies, Chips, Cookies, Store Bought Juices, Alcohol in Excess of  1-2 drinks a day, Lemonade,  Artificial Sweeteners, Doughnuts, Coffee Creamers, "Sugar-free" Products, etc, etc.  This is not a complete list.....    -Consulting with certified diabetes educators is proven to provide you with the most accurate and current information on diet.  Also, you may be  interested in discussing diet options/exchanges , we can schedule a visit with Jordan Mahoney, RDN, CDE for individualized nutrition education.  -Exercise: If you are able: 30 -60 minutes a day ,4 days a week, or 150 minutes of moderate intensity exercise weekly.    The longer the better if tolerated.  Combine stretch, strength, and aerobic activities.  If you were told in the past that you have high risk for cardiovascular diseases, or if you are currently symptomatic, you may seek evaluation by your heart doctor prior to initiating moderate to intense exercise programs.                                  Additional Care Considerations for Diabetes/Prediabetes   -Diabetes  is a chronic disease.  The most important care consideration is regular follow-up with your diabetes care provider with the goal being avoiding or delaying its complications and to take advantage of advances in medications and technology.  If appropriate actions are taken early enough, type 2 diabetes can even be   reversed.  Seek information from the right source.  - Whole Food, Plant Predominant Nutrition is highly recommended: Eat Plenty of vegetables, Mushrooms, fruits, Legumes, Whole Grains, Nuts, seeds in lieu of processed meats, processed snacks/pastries red meat, poultry, eggs as recommended by American College of  Lifestyle Medicine (ACLM).  -Type 2 diabetes is known to coexist with other important comorbidities such as high blood pressure and high cholesterol.  It is critical to control not only the  diabetes but also the high blood pressure and high cholesterol to minimize and delay the risk of complications including coronary artery disease, stroke, amputations, blindness, etc.  The good news is that this diet recommendation for type 2 diabetes is also very helpful for managing high cholesterol and high blood blood pressure.  - Studies showed that people with diabetes will benefit from a class of medications known as ACE inhibitors and statins.  Unless there are specific reasons not to be on these medications, the standard of care is to consider getting one from these groups of medications at an optimal doses.  These medications are generally considered safe and proven to help protect the heart and the kidneys.    - People with diabetes are encouraged to initiate and maintain regular follow-up with eye doctors, foot doctors, dentists , and if necessary heart and kidney doctors.     - It is highly recommended that people with diabetes quit smoking or stay away from smoking, and get yearly  flu vaccine and pneumonia vaccine at least every 5 years.  See above for additional recommendations on exercise, sleep, stress management , and healthy social connections.      

## 2021-11-20 NOTE — Progress Notes (Signed)
11/20/2021, 9:43 AM   Jordan Mahoney is a 71 y.o.-year-old female patient returning for follow-up after she was seen in consultation for hypothyroidism.  She was also recently diagnosed with type 2 diabetes.  Her previsit labs show hyperlipidemia.    PCP:  Ludwig Clarks, FNP.   Past Medical History:  Diagnosis Date   Allergic rhinoconjunctivitis    Allergies    Arthritis    Back pain    Chronic uveitis    Depression    Frequent UTI    GERD (gastroesophageal reflux disease)    HA (headache)    Hearing loss    wearing bilateral aides   History of hiatal hernia    Hypothyroidism    Lactose intolerance    Migraine    Osteopenia    Pre-diabetes    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HCC)    Sensorineural hearing loss    Tingling of both feet    Tinnitus     Past Surgical History:  Procedure Laterality Date   ABDOMINAL HYSTERECTOMY     belly button     belly button hernia   BRONCHIAL BIOPSY  08/05/2020   Procedure: BRONCHIAL BIOPSIES;  Surgeon: Garner Nash, DO;  Location: [facility];  Service: Pulmonary;;   BRONCHIAL BRUSHINGS  08/05/2020   Procedure: BRONCHIAL BRUSHINGS;  Surgeon: Garner Nash, DO;  Location: [place];  Service: Pulmonary;;   BRONCHIAL NEEDLE ASPIRATION BIOPSY  08/05/2020   Procedure: BRONCHIAL NEEDLE ASPIRATION BIOPSIES;  Surgeon: Garner Nash, DO;  Location: [place];  Service: Pulmonary;;   BRONCHIAL WASHINGS  08/05/2020   Procedure: BRONCHIAL WASHINGS;  Surgeon: Garner Nash, DO;  Location: [facility];  Service: Pulmonary;;   c sections     CATARACT EXTRACTION Right    CHOLECYSTECTOMY     COLON RESECTION N/A 04/17/2018   Procedure: LAPAROSCOPIC COLON RESECTION POSSIBLE OSTOMY;  Surgeon: Benjamine Sprague, DO;  Location: [facility];  Service: General;  Laterality: N/A;   COLONOSCOPY  10/13/2021   COLONOSCOPY WITH PROPOFOL  N/A 07/23/2020   Procedure: COLONOSCOPY WITH PROPOFOL;  Surgeon: Toledo, Benay Pike, MD;  Location: [facility];  Service: Gastroenterology;  Laterality: N/A;   DIAGNOSTIC LAPAROSCOPY     ELBOW SURGERY Left    EYE SURGERY Right    glaucoma and stent surgery   FIDUCIAL MARKER PLACEMENT  08/05/2020   Procedure: FIDUCIAL MARKER PLACEMENT;  Surgeon: Garner Nash, DO;  Location: [facility];  Service: Pulmonary;;   GANGLION CYST EXCISION Left 01/22/2016   base of thumb   GASTRIC BYPASS     GLAUCOMA SURGERY     GLAUCOMA SURGERY  12/13/2019   right eye   HERNIA REPAIR     INTERCOSTAL NERVE BLOCK Right 09/01/2020   Procedure: INTERCOSTAL NERVE BLOCK;  Surgeon: Lajuana Matte, MD;  Location: [facility]  OR;  Service: Thoracic;  Laterality: Right;   LUNG REMOVAL, PARTIAL     R upper lobe   neck fusion  07/04/2017   Dr. Carloyn Manner in [place] Right 09/01/2020   Procedure: NODE DISSECTION;  Surgeon: Lajuana Matte, MD;  Location: [place];  Service: Thoracic;  Laterality: Right;   [place] Right 08/05/2020   Procedure: VIDEO BRONCHOSCOPY WITH ENDOBRONCHIAL NAVIGATION;  Surgeon: Garner Nash, DO;  Location: [place];  Service: Pulmonary;  Laterality: Right;   WRIST SURGERY Right     Social History   Socioeconomic History   Marital status: Married    Spouse name: Not on file   Number of children: 2   Years of education: college   Highest education level: Not on file  Occupational History    Comment: retired  Tobacco Use   Smoking status: Former    Packs/day: 0.50    Years: 10.00    Total pack years: 5.00    Types: Cigarettes    Quit date: 1982    Years since quitting: 41.7   Smokeless tobacco: Never  Vaping Use   Vaping Use: Never used  Substance and Sexual Activity   Alcohol use: Not Currently    Alcohol/week: 1.0 standard drink of alcohol    Types: 1 Cans of beer per week   Drug use: No   Sexual  activity: Not on file  Other Topics Concern   Not on file  Social History Narrative   Patient lives at home with her husband Jordan Mahoney).    Education two years of college.   Caffeine - two glasses of tea.   Social Determinants of Health   Financial Resource Strain: Not on file  Food Insecurity: Not on file  Transportation Needs: Not on file  Physical Activity: Not on file  Stress: Not on file  Social Connections: Not on file    Family History  Problem Relation Age of Onset   Diabetes Father    Stroke Father    Squamous cell carcinoma Mother    Migraines Daughter    Breast cancer Paternal Aunt    Allergic rhinitis Neg Hx    Angioedema Neg Hx    Asthma Neg Hx    Eczema Neg Hx    Immunodeficiency Neg Hx    Urticaria Neg Hx     Outpatient Encounter Medications as of 11/20/2021  Medication Sig   Ascorbic Acid (VITAMIN C PO) Take 1 mg by mouth 2 (two) times daily.   azelastine (ASTELIN) 0.1 % nasal spray Place 2 sprays into both nostrils daily.   CALCIUM CITRATE PO Take 1,000 mg by mouth at bedtime. Bariatric Advantage   Cholecalciferol (VITAMIN D3) 125 MCG (5000 UT) CAPS Take 5,000 Units by mouth daily.   Cyanocobalamin (VITAMIN B12) 1000 MCG TBCR Take 1,000 mcg by mouth in the morning.   cyproheptadine (PERIACTIN) 4 MG tablet Take 1 tablet (4 mg total) by mouth 2 (two) times daily.   dorzolamide-timolol (COSOPT) 22.3-6.8 MG/ML ophthalmic solution Place 1 drop into the right eye 2 (two) times daily.   eletriptan (RELPAX) 40 MG tablet Take 1 tablet (40 mg total) by mouth every 2 (two) hours as needed for migraine. 40 mg on set May repeat in 2 hours if headache persists or recurs. Not to exceed 2 tabs/24hours.   EPINEPHrine 0.3 mg/0.3 mL IJ SOAJ injection Inject 0.3 mg into the muscle as needed for anaphylaxis. (  Patient not taking: Reported on 4/40/3474)   folic acid (FOLVITE) 1 MG tablet Take 1 mg by mouth in the morning.   Fremanezumab-vfrm (AJOVY) 225 MG/1.5ML SOAJ Inject 225 mg  into the skin every 30 (thirty) days.   gabapentin (NEURONTIN) 400 MG capsule Take 1 capsule (400 mg total) by mouth 2 (two) times daily.   Krill Oil 350 MG CAPS Take 350 mg by mouth daily.   levothyroxine (SYNTHROID) 75 MCG tablet Take 1 tablet (75 mcg total) by mouth daily before breakfast.   Magnesium Oxide 400 MG CAPS Take 400 mg by mouth in the morning and at bedtime.   meclizine (ANTIVERT) 25 MG tablet Take by mouth.   Multiple Vitamin (MULTIVITAMIN) tablet Take 1 tablet by mouth 2 (two) times daily. Bariatric Advantage   omeprazole (PRILOSEC) 20 MG capsule Take 1 capsule (20 mg total) by mouth in the morning and at bedtime.   prednisoLONE acetate (PRED FORTE) 1 % ophthalmic suspension Place 1 drop into the right eye daily.   Probiotic Product (PROBIOTIC DAILY PO) Take by mouth.   riboflavin (VITAMIN B-2) 100 MG TABS tablet Take 100 mg by mouth in the morning and at bedtime.   solifenacin (VESICARE) 10 MG tablet Take 10 mg by mouth in the morning.   tiZANidine (ZANAFLEX) 4 MG capsule Take 4 mg by mouth 3 (three) times daily as needed for muscle spasms.   [DISCONTINUED] levothyroxine (SYNTHROID) 75 MCG tablet TAKE 1 TABLET DAILY BEFORE BREAKFAST   No facility-administered encounter medications on file as of 11/20/2021.    ALLERGIES: Allergies  Allergen Reactions   Diazepam     Violent dreams   Sulfa Antibiotics Other (See Comments)    Causes skin reaction ( on her mid back)  Pigmentation.  A fixed drug reaction   Tape Other (See Comments)    Blister when pulled of or stays on too long   Latex Hives and Rash   Other Itching and Rash    Metal contact allergy Blister   VACCINATION STATUS: Immunization History  Administered Date(s) Administered   Fluad Quad(high Dose 65+) 10/30/2018   Influenza Whole 12/09/2000, 12/11/2001, 03/20/2003   Influenza, High Dose Seasonal PF 12/17/2017   Influenza, Seasonal, Injecte, Preservative Fre 12/02/2009, 11/30/2010, 12/07/2011    Influenza,inj,Quad PF,6+ Mos 12/20/2006   Influenza,inj,quad, With Preservative 11/16/2015   Influenza-Unspecified 12/10/2005, 12/20/2006   Pneumococcal Conjugate-13 12/24/2017, 10/30/2018   Td 07/03/2003   Tdap 01/19/2011   Zoster Recombinat (Shingrix) 12/24/2017, 08/01/2018     HPI    Jordan Mahoney  is a patient with the above medical history.  During her last visit, she was diagnosed with hypothyroidism and was initiated on levothyroxine.  She is currently on levothyroxine 75 mcg p.o. daily before breakfast.    She reports compliance and consistency with her medication.  Her previsit thyroid function tests are consistent with appropriate replacement.      -She reports improvement in her previous symptoms including  fatigue, cold intolerance.  She has other medical problems including iritis, dry skin, dry brittle nails.  She has family history of thyroid dysfunction in her mother and in her sibling.  She has no family history of thyroid malignancy.   Patient denies dysphagia, shortness of breath, nor voice change.  No history of  radiation therapy to head or neck. She also is on follow-up for diabetes, POC a1c is increasing to 7% from 6.1% during last visit.  She presented with weight gain.   Admittedly, she has not  engaged to lifestyle recommendations given to her last visit. 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    ROS:  Limited as above.   Physical Exam: BP 112/78   Pulse 64   Ht 5\' 6"  (1.676 m)   Wt 219 lb 12.8 oz (99.7 kg)   BMI 35.48 kg/m  Wt Readings from Last 3 Encounters:  11/20/21 219 lb 12.8 oz (99.7 kg)  10/30/21 216 lb 12.8 oz (98.3 kg)  06/23/21 208 lb (94.3 kg)    Constitutional:  Body mass index is 35.48 kg/m., not in acute distress, normal state of mind Eyes: PERRLA, EOMI, no exophthalmos ENT: moist mucous membranes, no thyromegaly, no cervical  lymphadenopathy   CMP ( most recent) CMP     Component Value Date/Time   NA 134 (L) 10/27/2021 1047   K 4.2 10/27/2021 1047   CL 104 10/27/2021 1047   CO2 24 10/27/2021 1047   GLUCOSE 114 (H) 10/27/2021 1047   BUN 14 10/27/2021 1047   CREATININE 0.74 10/27/2021 1047   CALCIUM 8.6 (L) 10/27/2021 1047   PROT 7.1 10/27/2021 1047   PROT 6.8 11/15/2019 1048   ALBUMIN 3.9 10/27/2021 1047   AST 49 (H) 10/27/2021 1047   ALT 40 10/27/2021 1047   ALKPHOS 110 10/27/2021 1047   BILITOT 0.7 10/27/2021 1047   GFRNONAA >60 10/27/2021 1047   GFRAA >60 10/29/2019 1238     Diabetic Labs (most recent): Lab Results  Component Value Date   HGBA1C 7.0 11/20/2021   HGBA1C 6.1 05/21/2021   HGBA1C 6.6 11/20/2020     Lipid Panel ( most recent) Lipid Panel     Component Value Date/Time   CHOL 197 11/16/2021 0818   TRIG 178 (H) 11/16/2021 0818   HDL 51 11/16/2021 0818   CHOLHDL 3.9 11/16/2021 0818   LDLCALC 115 (H) 11/16/2021 0818   LABVLDL 31 11/16/2021 0818       Lab Results  Component Value Date   TSH 1.610 11/16/2021   TSH 1.790 05/13/2021   TSH 1.630 11/12/2020   TSH 0.356 (L) 05/13/2020   TSH 1.910 02/12/2020   TSH 5.940 (H) 11/15/2019   FREET4 1.07 11/16/2021   FREET4 1.18 05/13/2021   FREET4 1.10 11/12/2020   FREET4 1.30 05/13/2020   FREET4 1.09 02/12/2020       ASSESSMENT: 1. Hypothyroidism 2.  Type 2 diabetes- 3.  Hyperlipidemia  PLAN:  Her previsit thyroid function tests are still consistent with appropriate replacement.  She is advised to continue levothyroxine 75 mcg p.o. daily before breakfast.     - We discussed about the correct intake of her thyroid hormone, on empty stomach at fasting, with water, separated by at least 30 minutes from breakfast and other medications,  and separated by more than 4 hours from calcium, iron, multivitamins, acid reflux medications (PPIs). -Patient is made aware of the fact that thyroid hormone replacement is needed for  life, dose to be adjusted by periodic monitoring of thyroid function tests.    Her labs also confirm Hashimoto's thyroiditis as a etiology for hypothyroidism.   -Due to absence of clinical goiter, no need for thyroid ultrasound. -Her point-of-care A1c is now 7%, increasing from 6.1% coupled with her weight gain, hyperlipidemia, and the fact that she wishes to avoid medications, makes her a good candidate for lifestyle medicine.  She promises to engage in better at this time.  She has the support of  her husband.  - she acknowledges that there is a room for improvement in her food and drink choices. - Suggestion is made for her to avoid simple carbohydrates  from her diet including Cakes, Sweet Desserts, Ice Cream, Soda (diet and regular), Sweet Tea, Candies, Chips, Cookies, Store Bought Juices, Alcohol , Artificial Sweeteners,  Coffee Creamer, and "Sugar-free" Products, Lemonade. This will help patient to have more stable blood glucose profile and potentially avoid unintended weight gain.  The following Lifestyle Medicine recommendations according to [place]  [facility]) were discussed and and offered to patient and she  agrees to start the journey:  A. Whole Foods, Plant-Based Nutrition comprising of fruits and vegetables, plant-based proteins, whole-grain carbohydrates was discussed in detail with the patient.   A list for source of those nutrients were also provided to the patient.  Patient will use only water or unsweetened tea for hydration. B.  The need to stay away from risky substances including alcohol, smoking; obtaining 7 to 9 hours of restorative sleep, at least 150 minutes of moderate intensity exercise weekly, the importance of healthy social connections,  and stress management techniques were discussed. C.  A full color page of  Calorie density of various food groups per pound showing examples of each food groups was provided to the patient. She is advised to  maintain close follow-up with her PCP.  She will return in 3 months for reevaluation with fasting lipid panel, and repeat A1c.  She is advised to maintain close follow-up with her PMD and other providers .  Return in about 3 months (around 02/20/2022) for Fasting Labs  in AM B4 8, A1c -NV.   I spent 31 minutes in the care of the patient today including review of labs from Thyroid Function, CMP, and other relevant labs ; imaging/biopsy records (current and previous including abstractions from other facilities); face-to-face time discussing  her lab results and symptoms, medications doses, her options of short and long term treatment based on the latest standards of care / guidelines;   and documenting the encounter.  Andee Poles Mantel  participated in the discussions, expressed understanding, and voiced agreement with the above plans.  All questions were answered to her satisfaction. she is encouraged to contact clinic should she have any questions or concerns prior to her return visit.   Glade Lloyd, MD [facility] [address] [place], [place] [address] Phone: [phone]  Fax: [phone]   11/20/2021, 9:43 AM  This note was partially dictated with voice recognition software. Similar sounding words can be transcribed inadequately or may not  be corrected upon review.

## 2021-12-16 ENCOUNTER — Ambulatory Visit (INDEPENDENT_AMBULATORY_CARE_PROVIDER_SITE_OTHER): Payer: Medicare Other | Admitting: *Deleted

## 2021-12-16 DIAGNOSIS — J309 Allergic rhinitis, unspecified: Secondary | ICD-10-CM

## 2021-12-29 IMAGING — DX DG CHEST 2V
2 series · 2 of 2 positions shown · non-contrast
Comparison: Chest x-ray dated September 03, 2020.

CLINICAL DATA: Status post right upper lobectomy.

EXAM:
CHEST - 2 VIEW

[dg chest 2 view (1 of 2)]
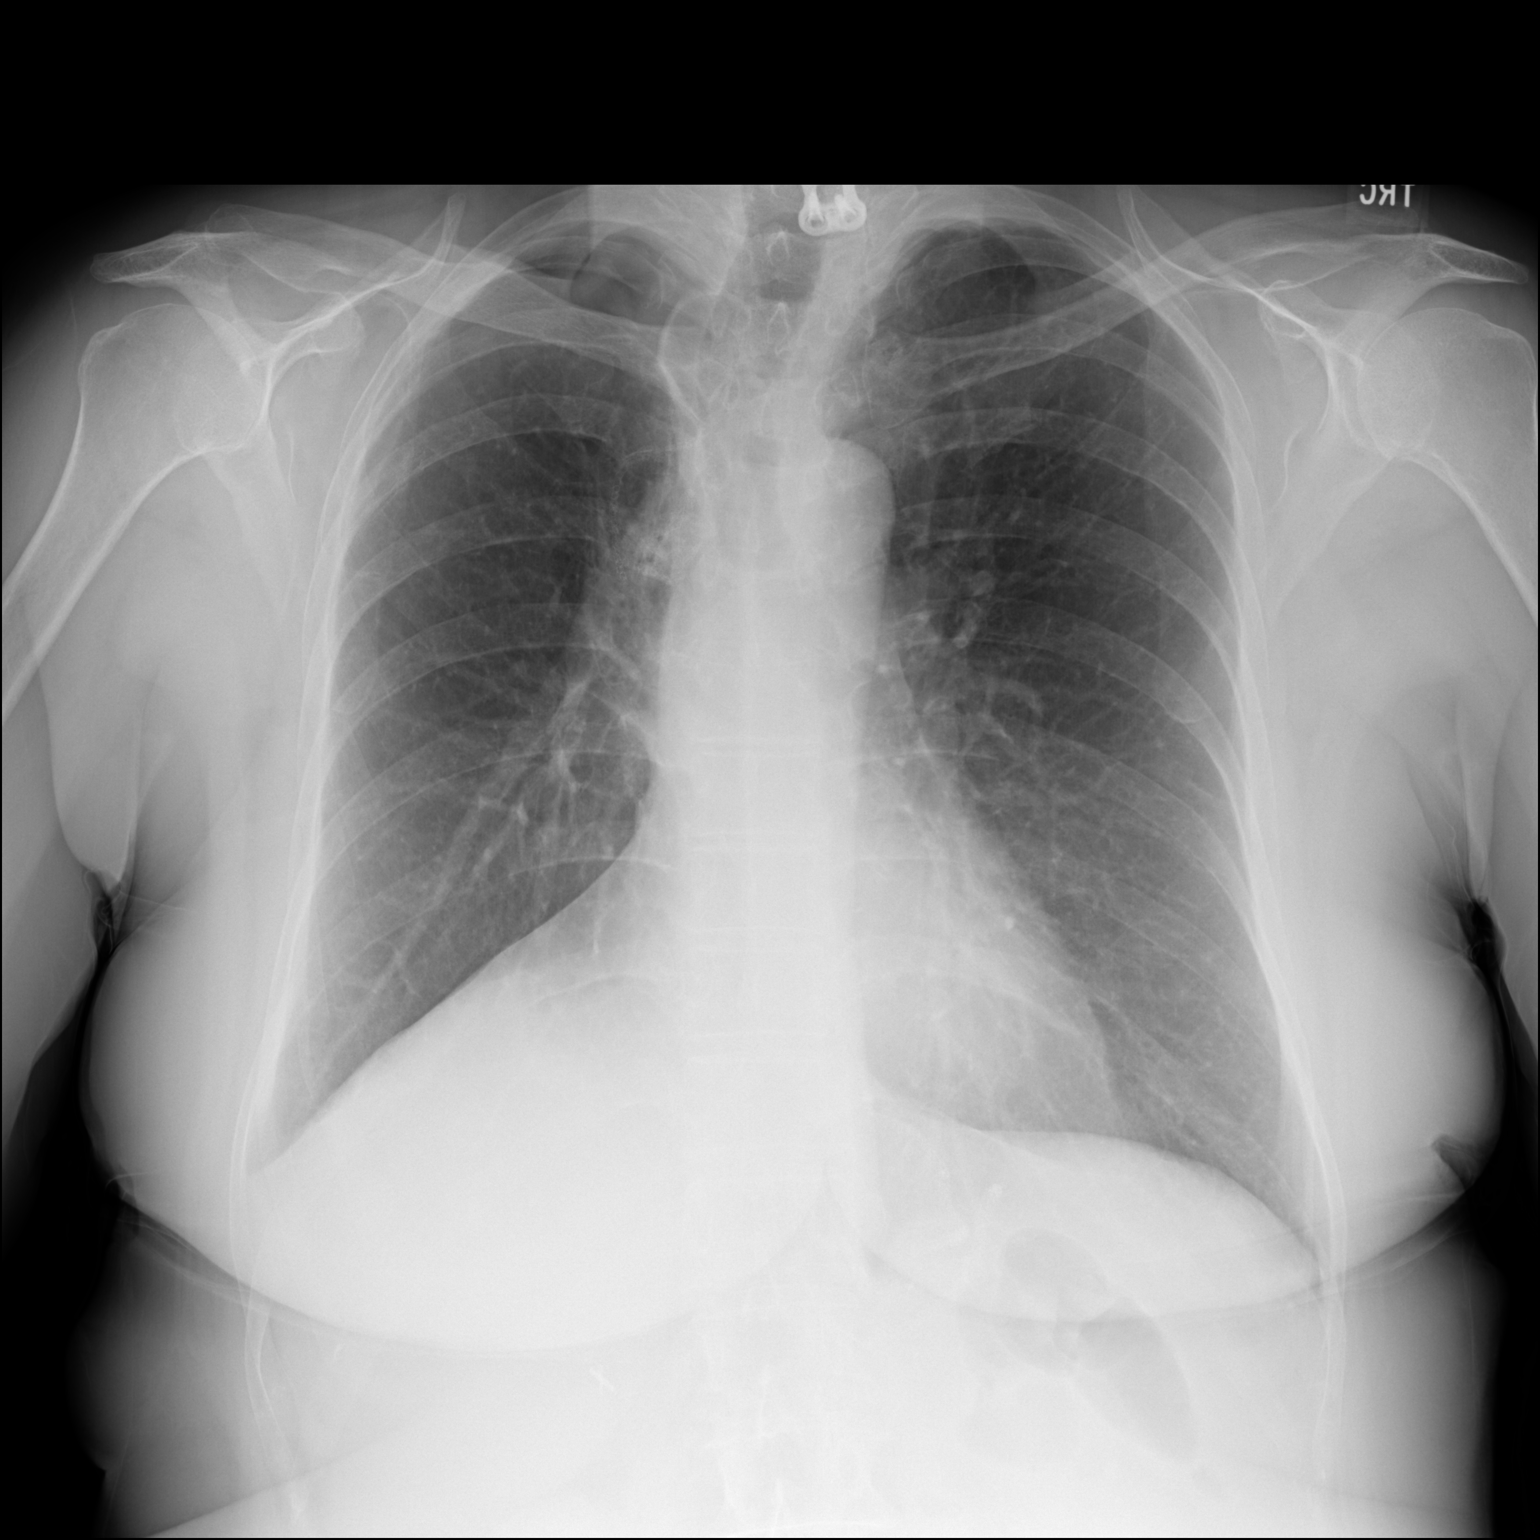

[dg chest 2 view (2 of 2)]
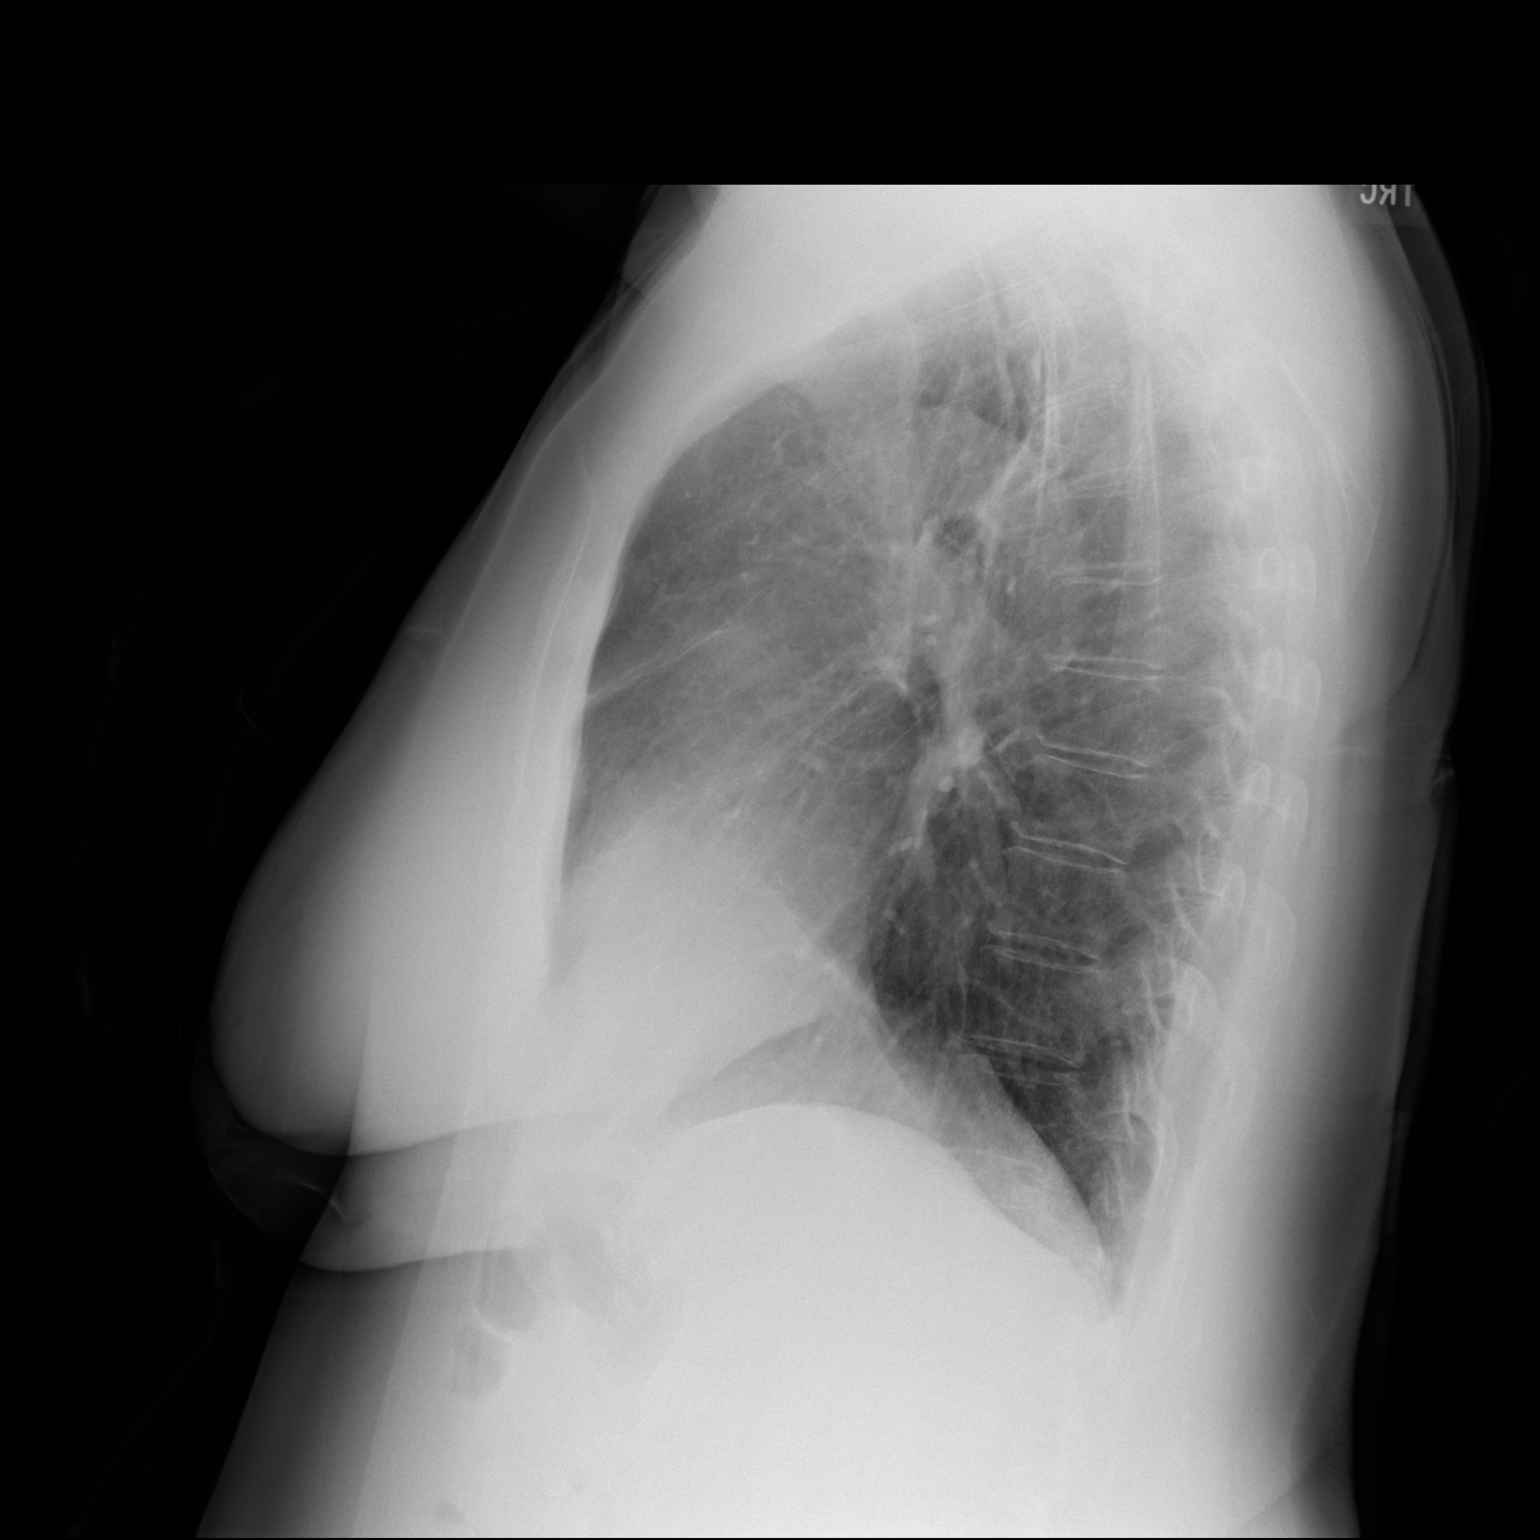

[2 of 2 positions shown; findings below may reference images not displayed]

FINDINGS: Interval right-sided chest tube removal. The heart size and
mediastinal contours are within normal limits. Normal pulmonary
vascularity. Postsurgical changes in the central right lung related
to prior right upper lobectomy. Associated volume loss in the right
hemithorax. Unchanged small right apical pneumothorax. No
consolidation or pleural effusion. No acute osseous abnormality.
IMPRESSION: 1. Unchanged small right apical pneumothorax.

## 2022-01-13 ENCOUNTER — Ambulatory Visit (INDEPENDENT_AMBULATORY_CARE_PROVIDER_SITE_OTHER): Payer: Medicare Other

## 2022-01-13 DIAGNOSIS — J309 Allergic rhinitis, unspecified: Secondary | ICD-10-CM | POA: Diagnosis not present

## 2022-01-20 ENCOUNTER — Ambulatory Visit (INDEPENDENT_AMBULATORY_CARE_PROVIDER_SITE_OTHER): Payer: Medicare Other

## 2022-01-20 DIAGNOSIS — J309 Allergic rhinitis, unspecified: Secondary | ICD-10-CM

## 2022-01-27 ENCOUNTER — Ambulatory Visit (INDEPENDENT_AMBULATORY_CARE_PROVIDER_SITE_OTHER): Payer: Medicare Other

## 2022-01-27 ENCOUNTER — Ambulatory Visit (INDEPENDENT_AMBULATORY_CARE_PROVIDER_SITE_OTHER): Payer: Medicare Other | Admitting: Dermatology

## 2022-01-27 VITALS — BP 126/81 | HR 92

## 2022-01-27 DIAGNOSIS — L820 Inflamed seborrheic keratosis: Secondary | ICD-10-CM | POA: Diagnosis not present

## 2022-01-27 DIAGNOSIS — J309 Allergic rhinitis, unspecified: Secondary | ICD-10-CM | POA: Diagnosis not present

## 2022-01-27 DIAGNOSIS — L719 Rosacea, unspecified: Secondary | ICD-10-CM

## 2022-01-27 MED ORDER — PIMECROLIMUS 1 % EX CREA: TOPICAL_CREAM | CUTANEOUS | 1 refills | Status: AC

## 2022-01-27 MED ORDER — IVERMECTIN 1 % EX CREA: 1.0000 | TOPICAL_CREAM | Freq: Every day | CUTANEOUS | 4 refills | Status: AC

## 2022-01-27 NOTE — Patient Instructions (Signed)
Due to recent changes in healthcare laws, you may see results of your pathology and/or laboratory studies on MyChart before the doctors have had a chance to review them. We understand that in some cases there may be results that are confusing or concerning to you. Please understand that not all results are received at the same time and often the doctors may need to interpret multiple results in order to provide you with the best plan of care or course of treatment. Therefore, we ask that you please give us 2 business days to thoroughly review all your results before contacting the office for clarification. Should we see a critical lab result, you will be contacted sooner.   If You Need Anything After Your Visit  If you have any questions or concerns for your doctor, please call our main line at [phone] and press option 4 to reach your doctor's medical assistant. If no one answers, please leave a voicemail as directed and we will return your call as soon as possible. Messages left after 4 pm will be answered the following business day.   You may also send us a message via MyChart. We typically respond to MyChart messages within 1-2 business days.  For prescription refills, please ask your pharmacy to contact our office. Our fax number is [phone].  If you have an urgent issue when the clinic is closed that cannot wait until the next business day, you can page your doctor at the number below.    Please note that while we do our best to be available for urgent issues outside of office hours, we are not available 24/7.   If you have an urgent issue and are unable to reach us, you may choose to seek medical care at your doctor's office, retail clinic, urgent care center, or emergency room.  If you have a medical emergency, please immediately call 911 or go to the emergency department.  Pager Numbers  - Dr. Kowalski: [phone]  - Dr. Moye: [phone]  - Dr. Stewart:  [phone]  In the event of inclement weather, please call our main line at [phone] for an update on the status of any delays or closures.  Dermatology Medication Tips: Please keep the boxes that topical medications come in in order to help keep track of the instructions about where and how to use these. Pharmacies typically print the medication instructions only on the boxes and not directly on the medication tubes.   If your medication is too expensive, please contact our office at [phone] option 4 or send us a message through MyChart.   We are unable to tell what your co-pay for medications will be in advance as this is different depending on your insurance coverage. However, we may be able to find a substitute medication at lower cost or fill out paperwork to get insurance to cover a needed medication.   If a prior authorization is required to get your medication covered by your insurance company, please allow us 1-2 business days to complete this process.  Drug prices often vary depending on where the prescription is filled and some pharmacies may offer cheaper prices.  The website www.goodrx.com contains coupons for medications through different pharmacies. The prices here do not account for what the cost may be with help from insurance (it may be cheaper with your insurance), but the website can give you the price if you did not use any insurance.  - You can print the associated coupon and take it with   your prescription to the pharmacy.  - You may also stop by our office during regular business hours and pick up a GoodRx coupon card.  - If you need your prescription sent electronically to a different pharmacy, notify our office through [facility] MyChart or by phone at [phone] option 4.     Si Usted Necesita Algo Despus de Su Visita  Tambin puede enviarnos un mensaje a travs de MyChart. Por lo general respondemos a los mensajes de MyChart en el transcurso de 1 a 2  das hbiles.  Para renovar recetas, por favor pida a su farmacia que se ponga en contacto con nuestra oficina. Nuestro nmero de fax es el [phone].  Si tiene un asunto urgente cuando la clnica est cerrada y que no puede esperar hasta el siguiente da hbil, puede llamar/localizar a su doctor(a) al nmero que aparece a continuacin.   Por favor, tenga en cuenta que aunque hacemos todo lo posible para estar disponibles para asuntos urgentes fuera del horario de oficina, no estamos disponibles las 24 horas del da, los 7 das de la semana.   Si tiene un problema urgente y no puede comunicarse con nosotros, puede optar por buscar atencin mdica  en el consultorio de su doctor(a), en una clnica privada, en un centro de atencin urgente o en una sala de emergencias.  Si tiene una emergencia mdica, por favor llame inmediatamente al 911 o vaya a la sala de emergencias.  Nmeros de bper  - Dr. Kowalski: [phone]  - Dra. Moye: [phone]  - Dra. Stewart: [phone]  En caso de inclemencias del tiempo, por favor llame a nuestra lnea principal al [phone] para una actualizacin sobre el estado de cualquier retraso o cierre.  Consejos para la medicacin en dermatologa: Por favor, guarde las cajas en las que vienen los medicamentos de uso tpico para ayudarle a seguir las instrucciones sobre dnde y cmo usarlos. Las farmacias generalmente imprimen las instrucciones del medicamento slo en las cajas y no directamente en los tubos del medicamento.   Si su medicamento es muy caro, por favor, pngase en contacto con nuestra oficina llamando al [phone] y presione la opcin 4 o envenos un mensaje a travs de MyChart.   No podemos decirle cul ser su copago por los medicamentos por adelantado ya que esto es diferente dependiendo de la cobertura de su seguro. Sin embargo, es posible que podamos encontrar un medicamento sustituto a menor costo o llenar un formulario para que el  seguro cubra el medicamento que se considera necesario.   Si se requiere una autorizacin previa para que su compaa de seguros cubra su medicamento, por favor permtanos de 1 a 2 das hbiles para completar este proceso.  Los precios de los medicamentos varan con frecuencia dependiendo del lugar de dnde se surte la receta y alguna farmacias pueden ofrecer precios ms baratos.  El sitio web www.goodrx.com tiene cupones para medicamentos de diferentes farmacias. Los precios aqu no tienen en cuenta lo que podra costar con la ayuda del seguro (puede ser ms barato con su seguro), pero el sitio web puede darle el precio si no utiliz ningn seguro.  - Puede imprimir el cupn correspondiente y llevarlo con su receta a la farmacia.  - Tambin puede pasar por nuestra oficina durante el horario de atencin regular y recoger una tarjeta de cupones de GoodRx.  - Si necesita que su receta se enve electrnicamente a una farmacia diferente, informe a nuestra oficina a travs de MyChart de [place]   o por telfono llamando al [phone] y presione la opcin 4.  

## 2022-01-27 NOTE — Progress Notes (Signed)
   Follow-Up Visit   Subjective  Jordan Mahoney is a 71 y.o. female who presents for the following: Rosacea (Face, >2 yrs, needs Soolantra refill, pt also used Elidel cream for the pustuls) and check spots (R shoulder, pt had txted with LN2 2 wks ago by PCP).   The following portions of the chart were reviewed this encounter and updated as appropriate:       Review of Systems:  No other skin or systemic complaints except as noted in HPI or Assessment and Plan.  Objective  Well appearing patient in no apparent distress; mood and affect are within normal limits.  A focused examination was performed including face, shoulder. Relevant physical exam findings are noted in the Assessment and Plan.  face Mild erythema with telangiectasias nose, malar cheeks  Right Shoulder - Anterior Healing keratotic paps    Assessment & Plan  Rosacea face  Chronic and persistent condition with duration or expected duration over one year. Condition is symptomatic/ bothersome to patient. Not currently at goal.   Rosacea is a chronic progressive skin condition usually affecting the face of adults, causing redness and/or acne bumps. It is treatable but not curable. It sometimes affects the eyes (ocular rosacea) as well. It may respond to topical and/or systemic medication and can flare with stress, sun exposure, alcohol, exercise, topical steroids (including hydrocortisone/cortisone 10) and some foods.  Daily application of broad spectrum spf 30+ sunscreen to face is recommended to reduce flares.  Restart Soolantra cream qhs to face  Continue Elidel cream qd/bid face prn itch  Ivermectin (SOOLANTRA) 1 % CREA - face Apply 1 Application topically at bedtime. Qhs to face for rosacea  Inflamed seborrheic keratosis Right Shoulder - Anterior  With pruritus. Resolving from previous LN2 treatment 2 wks ago  Start Elidel cream qd/bid prn itch   pimecrolimus (ELIDEL) 1 % cream - Right Shoulder -  Anterior Apply topically as directed. Qd to bid aa right shoulder until resolved   Return in about 6 months (around 07/29/2022), or if symptoms worsen or fail to improve, for Rosacea f/u.  I, Othelia Pulling, RMA, am acting as scribe for Brendolyn Patty, MD .  Documentation: I have reviewed the above documentation for accuracy and completeness, and I agree with the above.  Brendolyn Patty MD

## 2022-02-22 ENCOUNTER — Ambulatory Visit: Payer: Medicare Other | Admitting: "Endocrinology

## 2022-02-25 DIAGNOSIS — J3081 Allergic rhinitis due to animal (cat) (dog) hair and dander: Secondary | ICD-10-CM

## 2022-02-25 NOTE — Progress Notes (Signed)
VIALS EXP 02-26-23

## 2022-02-26 DIAGNOSIS — J3089 Other allergic rhinitis: Secondary | ICD-10-CM | POA: Diagnosis not present

## 2022-03-01 ENCOUNTER — Encounter: Payer: Self-pay | Admitting: Pulmonary Disease

## 2022-03-01 ENCOUNTER — Ambulatory Visit (INDEPENDENT_AMBULATORY_CARE_PROVIDER_SITE_OTHER): Payer: Medicare Other | Admitting: Pulmonary Disease

## 2022-03-01 VITALS — BP 120/90 | HR 83 | Ht 66.0 in | Wt 213.8 lb

## 2022-03-01 DIAGNOSIS — Z85038 Personal history of other malignant neoplasm of large intestine: Secondary | ICD-10-CM | POA: Diagnosis not present

## 2022-03-01 DIAGNOSIS — Z87891 Personal history of nicotine dependence: Secondary | ICD-10-CM | POA: Diagnosis not present

## 2022-03-01 DIAGNOSIS — C3491 Malignant neoplasm of unspecified part of right bronchus or lung: Secondary | ICD-10-CM | POA: Diagnosis not present

## 2022-03-01 NOTE — Progress Notes (Signed)
Synopsis: Referred in May 2022 for lung nodule by [name], FNP  Subjective:   PATIENT ID: Jordan Mahoney GENDER: female DOB: July 30, 1950, MRN: [id number]  Chief Complaint  Patient presents with   Follow-up    PMH of colon ca, stage IIa s/p resection at [facility], no chemo, ct scans at that time found the lung nodule. Has had subq follow up. Former smoker, quit in 1983. Also has thyroid disease. No family history of lung cancer.  Initial CT imaging revealed a small subcentimeter lesion in the right lung in 2020.  This has been followed.  On yearly CT imaging has showed growth.  Her most recent CT revealed an approximate 8 mm right upper lobe nodule that has lobulated margins.  Today in the office we reviewed her CT imaging from 2020 until now.  She has no significant respiratory complaints she quit smoking several years ago.  OV 08/25/2020: Patient was here today for follow-up after bronchoscopy.  Initially had consultation with Dr. Cliffton Asters but due to her history of colon cancer would prefer to have tissue diagnosis prior to consider resection.  Patient was taken for video bronchoscopy with endobronchial navigation.  Nodule actually turned out to be a primary lung cancer.  Patient has had a return visit with Dr. Cliffton Asters and plans for robotic resection on 09/01/2020.  Patient is anxious about this upcoming procedure but thankful that we have a diagnosis and are able to move forward.  OV 02/26/2021: Here today for follow-up after bronchoscopy and resection.  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  Followed by medical oncology now has surveillance imaging already set up.  From respiratory standpoint doing well.  She has gained some weight but she said it is being related to more sedentary after the surgery and dealing with vertigo.  OV 03/01/2022: Here today for follow-up regarding stage I adenocarcinoma of the lung, T1b lesion status post resection by  thoracic surgery.  She follows closely with medical oncology and has her surveillance imaging completed there.  Last imaging was in September 2023.  From respiratory standpoint she is doing well has no complaints today.    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HCC)  04/17/2018 Initial Diagnosis   Adenocarcinoma of sigmoid colon (HCC)   05/04/2018 Cancer Staging   Staging form: Colon and Rectum, AJCC 8th Edition - Pathologic stage from 05/04/2018: Stage IIA (pT3, pN0, cM0) - Signed by Rickard Patience, MD on 05/04/2018      Past Medical History:  Diagnosis Date   Allergic rhinoconjunctivitis    Allergies    Arthritis    Back pain    Chronic uveitis    Depression    Frequent UTI    GERD (gastroesophageal reflux disease)    HA (headache)    Hearing loss    wearing bilateral aides   History of hiatal hernia    Hypothyroidism    Lactose intolerance    Migraine    Osteopenia    Pre-diabetes    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HCC)    Sensorineural hearing loss    Tingling of both feet    Tinnitus      Family History  Problem Relation Age of Onset   Diabetes Father    Stroke Father    Squamous cell carcinoma Mother    Migraines Daughter    Breast cancer Paternal Aunt    Allergic rhinitis Neg Hx    Angioedema Neg Hx  Asthma Neg Hx    Eczema Neg Hx    Immunodeficiency Neg Hx    Urticaria Neg Hx      Past Surgical History:  Procedure Laterality Date   ABDOMINAL HYSTERECTOMY     belly button     belly button hernia   BRONCHIAL BIOPSY  08/05/2020   Procedure: BRONCHIAL BIOPSIES;  Surgeon: Josephine Igo, DO;  Location: [facility];  Service: Pulmonary;;   BRONCHIAL BRUSHINGS  08/05/2020   Procedure: BRONCHIAL BRUSHINGS;  Surgeon: Josephine Igo, DO;  Location: [facility];  Service: Pulmonary;;   BRONCHIAL NEEDLE ASPIRATION BIOPSY  08/05/2020   Procedure: BRONCHIAL NEEDLE ASPIRATION BIOPSIES;  Surgeon: Josephine Igo, DO;  Location: [facility];  Service:  Pulmonary;;   BRONCHIAL WASHINGS  08/05/2020   Procedure: BRONCHIAL WASHINGS;  Surgeon: Josephine Igo, DO;  Location: [facility];  Service: Pulmonary;;   c sections     CATARACT EXTRACTION Right    CHOLECYSTECTOMY     COLON RESECTION N/A 04/17/2018   Procedure: LAPAROSCOPIC COLON RESECTION POSSIBLE OSTOMY;  Surgeon: Sung Amabile, DO;  Location: [facility];  Service: General;  Laterality: N/A;   COLONOSCOPY  10/13/2021   COLONOSCOPY WITH PROPOFOL N/A 07/23/2020   Procedure: COLONOSCOPY WITH PROPOFOL;  Surgeon: Toledo, Boykin Nearing, MD;  Location: [facility];  Service: Gastroenterology;  Laterality: N/A;   DIAGNOSTIC LAPAROSCOPY     ELBOW SURGERY Left    EYE SURGERY Right    glaucoma and stent surgery   FIDUCIAL MARKER PLACEMENT  08/05/2020   Procedure: FIDUCIAL MARKER PLACEMENT;  Surgeon: Josephine Igo, DO;  Location: [facility];  Service: Pulmonary;;   GANGLION CYST EXCISION Left 01/22/2016   base of thumb   GASTRIC BYPASS     GLAUCOMA SURGERY     GLAUCOMA SURGERY  12/13/2019   right eye   HERNIA REPAIR     INTERCOSTAL NERVE BLOCK Right 09/01/2020   Procedure: INTERCOSTAL NERVE BLOCK;  Surgeon: Corliss Skains, MD;  Location: [facility];  Service: Thoracic;  Laterality: Right;   LUNG REMOVAL, PARTIAL     R upper lobe   neck fusion  07/04/2017   Dr. Channing Mutters in [place]   NODE DISSECTION Right 09/01/2020   Procedure: NODE DISSECTION;  Surgeon: Corliss Skains, MD;  Location: [facility];  Service: Thoracic;  Laterality: Right;   SPINE SURGERY     VIDEO BRONCHOSCOPY WITH ENDOBRONCHIAL NAVIGATION Right 08/05/2020   Procedure: VIDEO BRONCHOSCOPY WITH ENDOBRONCHIAL NAVIGATION;  Surgeon: Josephine Igo, DO;  Location: [facility];  Service: Pulmonary;  Laterality: Right;   WRIST SURGERY Right     Social History   Socioeconomic History   Marital status: Married    Spouse name: Not on file   Number of children: 2   Years of education: college   Highest education level: Not on  file  Occupational History    Comment: retired  Tobacco Use   Smoking status: Former    Packs/day: 0.50    Years: 10.00    Total pack years: 5.00    Types: Cigarettes    Quit date: 1982    Years since quitting: 42.0   Smokeless tobacco: Never  Vaping Use   Vaping Use: Never used  Substance and Sexual Activity   Alcohol use: Not Currently    Alcohol/week: 1.0 standard drink of alcohol    Types: 1 Cans of beer per week   Drug use: No   Sexual activity: Not on file  Other Topics Concern  Not on file  Social History Narrative   Patient lives at home with her husband Leonard Schwartz).    Education two years of college.   Caffeine - two glasses of tea.   Social Determinants of Health   Financial Resource Strain: Not on file  Food Insecurity: Not on file  Transportation Needs: Not on file  Physical Activity: Not on file  Stress: Not on file  Social Connections: Not on file  Intimate Partner Violence: Not on file     Allergies  Allergen Reactions   Diazepam     Violent dreams   Sulfa Antibiotics Other (See Comments)    Causes skin reaction ( on her mid back)  Pigmentation.  A fixed drug reaction   Tape Other (See Comments)    Blister when pulled of or stays on too long   Latex Hives and Rash   Other Itching and Rash    Metal contact allergy Blister     Outpatient Medications Prior to Visit  Medication Sig Dispense Refill   azelastine (ASTELIN) 0.1 % nasal spray Place 2 sprays into both nostrils daily. 90 mL 3   Ascorbic Acid (VITAMIN C PO) Take 1 mg by mouth 2 (two) times daily.     CALCIUM CITRATE PO Take 1,000 mg by mouth at bedtime. Bariatric Advantage     Cholecalciferol (VITAMIN D3) 125 MCG (5000 UT) CAPS Take 5,000 Units by mouth daily.     Cyanocobalamin (VITAMIN B12) 1000 MCG TBCR Take 1,000 mcg by mouth in the morning.     cyproheptadine (PERIACTIN) 4 MG tablet Take 1 tablet (4 mg total) by mouth 2 (two) times daily. 180 tablet 3   dorzolamide-timolol (COSOPT)  22.3-6.8 MG/ML ophthalmic solution Place 1 drop into the right eye 2 (two) times daily.     eletriptan (RELPAX) 40 MG tablet Take 1 tablet (40 mg total) by mouth every 2 (two) hours as needed for migraine. 40 mg on set May repeat in 2 hours if headache persists or recurs. Not to exceed 2 tabs/24hours. 30 tablet 3   EPINEPHrine 0.3 mg/0.3 mL IJ SOAJ injection Inject 0.3 mg into the muscle as needed for anaphylaxis. (Patient not taking: Reported on 10/30/2021) 1 each 1   folic acid (FOLVITE) 1 MG tablet Take 1 mg by mouth in the morning.     Fremanezumab-vfrm (AJOVY) 225 MG/1.5ML SOAJ Inject 225 mg into the skin every 30 (thirty) days. 4.5 mL 3   gabapentin (NEURONTIN) 400 MG capsule Take 1 capsule (400 mg total) by mouth 2 (two) times daily. 180 capsule 3   Ivermectin (SOOLANTRA) 1 % CREA Apply 1 Application topically at bedtime. Qhs to face for rosacea 135 g 4   Krill Oil 350 MG CAPS Take 350 mg by mouth daily.     levothyroxine (SYNTHROID) 75 MCG tablet Take 1 tablet (75 mcg total) by mouth daily before breakfast. 90 tablet 1   Magnesium Oxide 400 MG CAPS Take 400 mg by mouth in the morning and at bedtime.     meclizine (ANTIVERT) 25 MG tablet Take by mouth.     Multiple Vitamin (MULTIVITAMIN) tablet Take 1 tablet by mouth 2 (two) times daily. Bariatric Advantage     omeprazole (PRILOSEC) 20 MG capsule Take 1 capsule (20 mg total) by mouth in the morning and at bedtime. 60 capsule 5   pimecrolimus (ELIDEL) 1 % cream Apply topically as directed. Qd to bid aa right shoulder until resolved 90 g 1   prednisoLONE acetate (PRED  FORTE) 1 % ophthalmic suspension Place 1 drop into the right eye daily.     Probiotic Product (PROBIOTIC DAILY PO) Take by mouth.     riboflavin (VITAMIN B-2) 100 MG TABS tablet Take 100 mg by mouth in the morning and at bedtime.     solifenacin (VESICARE) 10 MG tablet Take 10 mg by mouth in the morning.     tiZANidine (ZANAFLEX) 4 MG capsule Take 4 mg by mouth 3 (three) times  daily as needed for muscle spasms.     No facility-administered medications prior to visit.    Review of Systems  Constitutional:  Negative for chills, fever, malaise/fatigue and weight loss.  HENT:  Negative for hearing loss, sore throat and tinnitus.   Eyes:  Negative for blurred vision and double vision.  Respiratory:  Negative for cough, hemoptysis, sputum production, shortness of breath, wheezing and stridor.   Cardiovascular:  Negative for chest pain, palpitations, orthopnea, leg swelling and PND.  Gastrointestinal:  Negative for abdominal pain, constipation, diarrhea, heartburn, nausea and vomiting.  Genitourinary:  Negative for dysuria, hematuria and urgency.  Musculoskeletal:  Negative for joint pain and myalgias.  Skin:  Negative for itching and rash.  Neurological:  Negative for dizziness, tingling, weakness and headaches.  Endo/Heme/Allergies:  Negative for environmental allergies. Does not bruise/bleed easily.  Psychiatric/Behavioral:  Negative for depression. The patient is not nervous/anxious and does not have insomnia.   All other systems reviewed and are negative.    Objective:  Physical Exam Vitals reviewed.  Constitutional:      General: She is not in acute distress.    Appearance: She is well-developed.  HENT:     Head: Normocephalic and atraumatic.  Eyes:     General: No scleral icterus.    Conjunctiva/sclera: Conjunctivae normal.     Pupils: Pupils are equal, round, and reactive to light.  Neck:     Vascular: No JVD.     Trachea: No tracheal deviation.  Cardiovascular:     Rate and Rhythm: Normal rate and regular rhythm.     Heart sounds: Normal heart sounds. No murmur heard. Pulmonary:     Effort: Pulmonary effort is normal. No tachypnea, accessory muscle usage or respiratory distress.     Breath sounds: No stridor. No wheezing, rhonchi or rales.  Abdominal:     General: There is no distension.     Palpations: Abdomen is soft.     Tenderness: There  is no abdominal tenderness.  Musculoskeletal:        General: No tenderness.     Cervical back: Neck supple.  Lymphadenopathy:     Cervical: No cervical adenopathy.  Skin:    General: Skin is warm and dry.     Capillary Refill: Capillary refill takes less than 2 seconds.     Findings: No rash.  Neurological:     Mental Status: She is alert and oriented to person, place, and time.  Psychiatric:        Behavior: Behavior normal.      Vitals:   03/01/22 0843  BP: (!) 120/90  Pulse: 83  SpO2: 99%  Weight: 213 lb 12.8 oz (97 kg)  Height: 5\' 6"  (1.676 m)    99% on RA BMI Readings from Last 3 Encounters:  03/01/22 34.51 kg/m  11/20/21 35.48 kg/m  10/30/21 34.99 kg/m   Wt Readings from Last 3 Encounters:  03/01/22 213 lb 12.8 oz (97 kg)  11/20/21 219 lb 12.8 oz (99.7 kg)  10/30/21 216  lb 12.8 oz (98.3 kg)     CBC    Component Value Date/Time   WBC 8.3 10/27/2021 1047   RBC 4.32 10/27/2021 1047   HGB 11.3 (L) 10/27/2021 1047   HCT 36.0 10/27/2021 1047   PLT 296 10/27/2021 1047   MCV 83.3 10/27/2021 1047   MCH 26.2 10/27/2021 1047   MCHC 31.4 10/27/2021 1047   RDW 15.9 (H) 10/27/2021 1047   LYMPHSABS 2.2 10/27/2021 1047   MONOABS 0.9 10/27/2021 1047   EOSABS 0.2 10/27/2021 1047   BASOSABS 0.1 10/27/2021 1047    Chest Imaging: 06/13/2020 CT chest: 8 x 6 x 9 mm right upper lobe pulmonary nodule that abuts the minor fissure.  Stable from March imaging however substantially increased in size from February 2020. The patient's images have been independently reviewed by me.    September 2023 CT chest: Changes in the upper lobe consistent with lobectomy.  No evidence of recurrence of disease. The patient's images have been independently reviewed by me.    Pulmonary Functions Testing Results:    Latest Ref Rng & Units 07/11/2020   10:30 AM  PFT Results  FVC-Pre L 2.89   FVC-Predicted Pre % 91   FVC-Post L 2.86   FVC-Predicted Post % 90   Pre FEV1/FVC % % 79    Post FEV1/FCV % % 84   FEV1-Pre L 2.28   FEV1-Predicted Pre % 93   FEV1-Post L 2.40   DLCO uncorrected ml/min/mmHg 20.18   DLCO UNC% % 74   DLCO corrected ml/min/mmHg 20.18   DLCO COR %Predicted % 74   DLVA Predicted % 79   TLC L 4.71   TLC % Predicted % 87   RV % Predicted % 95     FeNO:   Pathology:  Echocardiogram:   Heart Catheterization:     Assessment & Plan:     ICD-10-CM   1. Adenocarcinoma of right lung, stage 1 (HCC)  C34.91     2. History of colon cancer  Z85.038     3. Former smoker  Z87.891       Discussion:  This is a 72 year old female history of colon cancer stage IIa in 2020, former smoker quit 1983 he had a small right upper lobe pulmonary nodule taken for lobectomy in July 2022.  This was consistent with a stage I, T2b adenocarcinoma of the lung.  Plan: Continue image surveillance with oncology.  We discussed the importance of this.  She is can let us know if anything changes in her respiratory status.  Currently not on any inhalers. Follow-up with [place] as needed. Overall she is doing well post resection.    Current Outpatient Medications:    azelastine (ASTELIN) 0.1 % nasal spray, Place 2 sprays into both nostrils daily., Disp: 90 mL, Rfl: 3   Ascorbic Acid (VITAMIN C PO), Take 1 mg by mouth 2 (two) times daily., Disp: , Rfl:    CALCIUM CITRATE PO, Take 1,000 mg by mouth at bedtime. Bariatric Advantage, Disp: , Rfl:    Cholecalciferol (VITAMIN D3) 125 MCG (5000 UT) CAPS, Take 5,000 Units by mouth daily., Disp: , Rfl:    Cyanocobalamin (VITAMIN B12) 1000 MCG TBCR, Take 1,000 mcg by mouth in the morning., Disp: , Rfl:    cyproheptadine (PERIACTIN) 4 MG tablet, Take 1 tablet (4 mg total) by mouth 2 (two) times daily., Disp: 180 tablet, Rfl: 3   dorzolamide-timolol (COSOPT) 22.3-6.8 MG/ML ophthalmic solution, Place 1 drop into the right eye 2 (  two) times daily., Disp: , Rfl:    eletriptan (RELPAX) 40 MG tablet, Take 1 tablet (40 mg total) by mouth  every 2 (two) hours as needed for migraine. 40 mg on set May repeat in 2 hours if headache persists or recurs. Not to exceed 2 tabs/24hours., Disp: 30 tablet, Rfl: 3   EPINEPHrine 0.3 mg/0.3 mL IJ SOAJ injection, Inject 0.3 mg into the muscle as needed for anaphylaxis. (Patient not taking: Reported on 10/30/2021), Disp: 1 each, Rfl: 1   folic acid (FOLVITE) 1 MG tablet, Take 1 mg by mouth in the morning., Disp: , Rfl:    Fremanezumab-vfrm (AJOVY) 225 MG/1.5ML SOAJ, Inject 225 mg into the skin every 30 (thirty) days., Disp: 4.5 mL, Rfl: 3   gabapentin (NEURONTIN) 400 MG capsule, Take 1 capsule (400 mg total) by mouth 2 (two) times daily., Disp: 180 capsule, Rfl: 3   Ivermectin (SOOLANTRA) 1 % CREA, Apply 1 Application topically at bedtime. Qhs to face for rosacea, Disp: 135 g, Rfl: 4   Krill Oil 350 MG CAPS, Take 350 mg by mouth daily., Disp: , Rfl:    levothyroxine (SYNTHROID) 75 MCG tablet, Take 1 tablet (75 mcg total) by mouth daily before breakfast., Disp: 90 tablet, Rfl: 1   Magnesium Oxide 400 MG CAPS, Take 400 mg by mouth in the morning and at bedtime., Disp: , Rfl:    meclizine (ANTIVERT) 25 MG tablet, Take by mouth., Disp: , Rfl:    Multiple Vitamin (MULTIVITAMIN) tablet, Take 1 tablet by mouth 2 (two) times daily. Bariatric Advantage, Disp: , Rfl:    omeprazole (PRILOSEC) 20 MG capsule, Take 1 capsule (20 mg total) by mouth in the morning and at bedtime., Disp: 60 capsule, Rfl: 5   pimecrolimus (ELIDEL) 1 % cream, Apply topically as directed. Qd to bid aa right shoulder until resolved, Disp: 90 g, Rfl: 1   prednisoLONE acetate (PRED FORTE) 1 % ophthalmic suspension, Place 1 drop into the right eye daily., Disp: , Rfl:    Probiotic Product (PROBIOTIC DAILY PO), Take by mouth., Disp: , Rfl:    riboflavin (VITAMIN B-2) 100 MG TABS tablet, Take 100 mg by mouth in the morning and at bedtime., Disp: , Rfl:    solifenacin (VESICARE) 10 MG tablet, Take 10 mg by mouth in the morning., Disp: , Rfl:     tiZANidine (ZANAFLEX) 4 MG capsule, Take 4 mg by mouth 3 (three) times daily as needed for muscle spasms., Disp: , Rfl:    Josephine Igo, DO [facility] 03/01/2022 9:05 AM

## 2022-03-01 NOTE — Patient Instructions (Signed)
Thank you for visiting Dr. Bejamin Hackbart at [facility]. Today we recommend the following:  Return if symptoms worsen or fail to improve.    Please do your part to reduce the spread of COVID-19.  

## 2022-03-03 ENCOUNTER — Ambulatory Visit (INDEPENDENT_AMBULATORY_CARE_PROVIDER_SITE_OTHER): Payer: Medicare Other

## 2022-03-03 DIAGNOSIS — J309 Allergic rhinitis, unspecified: Secondary | ICD-10-CM

## 2022-03-16 ENCOUNTER — Other Ambulatory Visit: Payer: Self-pay | Admitting: Allergy & Immunology

## 2022-03-31 ENCOUNTER — Ambulatory Visit (INDEPENDENT_AMBULATORY_CARE_PROVIDER_SITE_OTHER): Payer: Medicare Other

## 2022-03-31 DIAGNOSIS — J309 Allergic rhinitis, unspecified: Secondary | ICD-10-CM | POA: Diagnosis not present

## 2022-04-09 ENCOUNTER — Ambulatory Visit: Payer: Medicare Other | Admitting: Allergy & Immunology

## 2022-04-28 ENCOUNTER — Ambulatory Visit (INDEPENDENT_AMBULATORY_CARE_PROVIDER_SITE_OTHER): Payer: Medicare Other

## 2022-04-28 DIAGNOSIS — J309 Allergic rhinitis, unspecified: Secondary | ICD-10-CM

## 2022-04-30 ENCOUNTER — Inpatient Hospital Stay: Payer: Medicare Other | Attending: Obstetrics and Gynecology

## 2022-04-30 ENCOUNTER — Ambulatory Visit
Admission: RE | Admit: 2022-04-30 | Discharge: 2022-04-30 | Disposition: A | Discharge status: Home / Self Care | Payer: Medicare Other | Source: Ambulatory Visit | Attending: Oncology | Admitting: Oncology

## 2022-04-30 DIAGNOSIS — D649 Anemia, unspecified: Secondary | ICD-10-CM | POA: Diagnosis not present

## 2022-04-30 DIAGNOSIS — C187 Malignant neoplasm of sigmoid colon: Secondary | ICD-10-CM

## 2022-04-30 DIAGNOSIS — Z85118 Personal history of other malignant neoplasm of bronchus and lung: Secondary | ICD-10-CM | POA: Insufficient documentation

## 2022-04-30 DIAGNOSIS — C3491 Malignant neoplasm of unspecified part of right bronchus or lung: Secondary | ICD-10-CM | POA: Insufficient documentation

## 2022-04-30 DIAGNOSIS — Z08 Encounter for follow-up examination after completed treatment for malignant neoplasm: Secondary | ICD-10-CM | POA: Diagnosis not present

## 2022-04-30 DIAGNOSIS — Z85038 Personal history of other malignant neoplasm of large intestine: Secondary | ICD-10-CM | POA: Insufficient documentation

## 2022-04-30 DIAGNOSIS — D500 Iron deficiency anemia secondary to blood loss (chronic): Secondary | ICD-10-CM

## 2022-04-30 LAB — CBC WITH DIFFERENTIAL/PLATELET
Abs Immature Granulocytes: 0.03 10*3/uL (ref 0.00–0.07)
Basophils Absolute: 0.1 10*3/uL (ref 0.0–0.1)
Basophils Relative: 1 %
Eosinophils Absolute: 0.1 10*3/uL (ref 0.0–0.5)
Eosinophils Relative: 1 %
HCT: 42.5 % (ref 36.0–46.0)
Hemoglobin: 13.4 g/dL (ref 12.0–15.0)
Immature Granulocytes: 0 %
Lymphocytes Relative: 30 %
Lymphs Abs: 2.4 10*3/uL (ref 0.7–4.0)
MCH: 27.7 pg (ref 26.0–34.0)
MCHC: 31.5 g/dL (ref 30.0–36.0)
MCV: 88 fL (ref 80.0–100.0)
Monocytes Absolute: 0.8 10*3/uL (ref 0.1–1.0)
Monocytes Relative: 10 %
Neutro Abs: 4.5 10*3/uL (ref 1.7–7.7)
Neutrophils Relative %: 58 %
Platelets: 250 10*3/uL (ref 150–400)
RBC: 4.83 MIL/uL (ref 3.87–5.11)
RDW: 16.8 % — ABNORMAL HIGH (ref 11.5–15.5)
WBC: 7.8 10*3/uL (ref 4.0–10.5)
nRBC: 0 % (ref 0.0–0.2)

## 2022-04-30 LAB — FERRITIN: Ferritin: 36 ng/mL (ref 11–307)

## 2022-04-30 LAB — IRON AND TIBC
Iron: 93 ug/dL (ref 28–170)
Saturation Ratios: 22 % (ref 10.4–31.8)
TIBC: 428 ug/dL (ref 250–450)
UIBC: 335 ug/dL

## 2022-04-30 LAB — COMPREHENSIVE METABOLIC PANEL
ALT: 44 U/L (ref 0–44)
AST: 42 U/L — ABNORMAL HIGH (ref 15–41)
Albumin: 3.9 g/dL (ref 3.5–5.0)
Alkaline Phosphatase: 105 U/L (ref 38–126)
Anion gap: 8 (ref 5–15)
BUN: 15 mg/dL (ref 8–23)
CO2: 24 mmol/L (ref 22–32)
Calcium: 8.5 mg/dL — ABNORMAL LOW (ref 8.9–10.3)
Chloride: 105 mmol/L (ref 98–111)
Creatinine, Ser: 0.78 mg/dL (ref 0.44–1.00)
GFR, Estimated: >60 mL/min (ref >60)
Glucose, Bld: 112 mg/dL — ABNORMAL HIGH (ref 70–99)
Potassium: 3.8 mmol/L (ref 3.5–5.1)
Sodium: 137 mmol/L (ref 135–145)
Total Bilirubin: 1 mg/dL (ref 0.3–1.2)
Total Protein: 7.2 g/dL (ref 6.5–8.1)

## 2022-04-30 MED ORDER — IOHEXOL 300 MG/ML  SOLN
100.0000 mL | Freq: Once | INTRAMUSCULAR | Status: AC | PRN
  Administered 2022-04-30: 100 mL via INTRAVENOUS

## 2022-05-01 LAB — CEA: CEA: 2.6 ng/mL (ref 0.0–4.7)

## 2022-05-04 ENCOUNTER — Inpatient Hospital Stay (HOSPITAL_BASED_OUTPATIENT_CLINIC_OR_DEPARTMENT_OTHER): Payer: Medicare Other | Admitting: Oncology

## 2022-05-04 ENCOUNTER — Encounter: Payer: Self-pay | Admitting: Oncology

## 2022-05-04 VITALS — BP 127/91 | HR 65 | Temp 96.5°F | Resp 18 | Wt 215.5 lb

## 2022-05-04 DIAGNOSIS — C3491 Malignant neoplasm of unspecified part of right bronchus or lung: Secondary | ICD-10-CM | POA: Diagnosis not present

## 2022-05-04 DIAGNOSIS — C187 Malignant neoplasm of sigmoid colon: Secondary | ICD-10-CM

## 2022-05-04 DIAGNOSIS — Z08 Encounter for follow-up examination after completed treatment for malignant neoplasm: Secondary | ICD-10-CM | POA: Diagnosis not present

## 2022-05-04 NOTE — Assessment & Plan Note (Addendum)
Stage I right lung adenocarcinoma, status post lobectomy. -09/01/20 CT chest contrast showed no cancer recurrence. Labs reviewed and discussed with patient. Repeat CT scanning in a year.

## 2022-05-04 NOTE — Assessment & Plan Note (Addendum)
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 CEA stable. Continue annual CT abdomen pelvis surveillance. -Recent CT showed no evidence of cancer recurrence.  Follow-up annually.

## 2022-05-04 NOTE — Progress Notes (Signed)
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 CEA stable. Continue annual CT abdomen pelvis surveillance. -Recent CT showed no evidence of cancer recurrence.  Follow-up annually.  Primary lung adenocarcinoma, right ([place]) Stage I right lung adenocarcinoma, status post lobectomy. -09/01/20 CT chest contrast showed no cancer recurrence. Labs reviewed and discussed with patient. Repeat CT scanning in a year.  Orders Placed This Encounter  Procedures   CT CHEST ABDOMEN PELVIS W CONTRAST    Standing Status:   Future    Standing Expiration Date:   05/04/2023    Order Specific Question:   If indicated for the ordered procedure, I authorize the administration of contrast media per Radiology protocol    Answer:   Yes    Order Specific Question:   Does the patient have a contrast media/X-ray dye allergy?    Answer:   No    Order Specific Question:   Preferred imaging location?    Answer:   [facility]    Order Specific Question:   Is Oral Contrast requested for this exam?    Answer:   Yes, Per Radiology protocol   CMP ([place] only)    Standing Status:   Future    Standing Expiration Date:   05/04/2023   CBC with Differential ([place] Only)    Standing Status:   Future    Standing Expiration Date:   05/04/2023   Iron and TIBC    Standing Status:   Future    Standing Expiration Date:   05/04/2023   Ferritin    Standing Status:   Future    Standing Expiration Date:   05/04/2023   CEA    Standing Status:   Future    Standing Expiration Date:   05/04/2023   Follow up 1 year,  All questions were answered. The patient knows to call the clinic with any problems, questions or concerns.  Earlie Server, MD,  PhD [facility] Hematology Oncology 05/04/2022   HISTORY OF PRESENTING ILLNESS:  Jordan Mahoney is a  72 y.o.  female with PMH listed below who was referred to me for evaluation of stage IIA colorectal cancer. Patient was initially seen care physician for evaluation of fatigue.  Lab work-up showed that patient's anemic.  Hemoccult was obtained and was positive.  Patient was referred to [place] [facility] and was seen and evaluated by Dr. Alice Reichert.  Reports that she had 2-3 bowel movement daily sometimes mushy and sometimes formed.  Denies seeing any bright red blood in the stool.  Denies any associated abdominal pain, gastric discomfort.  Appetite seems to be stable.  History of gastric bypass in 2010.  Cholecystectomy in 2000.  ?Unintentional weight loss 5 to 6 pounds for the past 2 months.   prior records showed she weighs 149 pounds on 09/28/2017, weighed 160 pounds 05/03/2017 Colonoscopy was obtained on 03/27/2018.  6 mm polyp found in sigmoid colon.  Polyp is sessile.An ulcerated partially obstructing mass was found in the rectosigmoid colon.  Mass was circumferential.  Measured 5 cm in length.  No bleeding was present.  Biopsy was taken.  Nonbleeding internal hemorrhoids were found during retroflexion.  #04/17/2018 underwent surgery.  Pathology showed pT3 pN0 Sigmoid colon adenocarcinoma, G2, moderately differentiated, all  margin negative, no LVI or perineural invasion. No loss of nuclear expression of MMR proteins: Low probability of MSI-H.  Stage IIA, No adjuvant chemotherapy was not  Offered  # Stage ! Lung cancer.  Slow-growing lung nodule in the right upper lobe.  Case on 05/01/2020 tumor board.Consensus reached upon referring patient to pulmonology Dr. Patsey Berthold for biopsy via bronchoscopy. 06/27/2020, PET scan shows no distant metastasis.  Right upper lobe pulm nodule SUV 1.3.  Asymmetric palatine tonsilla activity probably physiologic.  Lack of CT correlate. 08/05/2020 right upper lobe  fine-needle aspiration the bronchoscopy showed malignant cells consistent with non-small cell carcinoma.  09/01/2020, patient underwent right upper lobe lobectomy with lymph node dissection.  No LVI, pT1b pN0   INTERVAL HISTORY Jordan Mahoney is a 72 y.o. female who has above history reviewed by me today presents for follow up visit for management of Stage IIA colorectal cancer and Stage ! Lung cancer.   Patient reports feeling well. She has good appetite. She denies unintentional weight loss, hemoptysis, shortness of breath, blood in the stool, abdominal pain.   No new compliants  Review of Systems  Constitutional:  Negative for appetite change, chills, fatigue and fever.  HENT:   Negative for hearing loss and voice change.   Eyes:  Negative for eye problems.  Respiratory:  Negative for chest tightness and cough.   Cardiovascular:  Negative for chest pain.  Gastrointestinal:  Negative for abdominal distention, abdominal pain and blood in stool.  Endocrine: Negative for hot flashes.  Genitourinary:  Negative for difficulty urinating and frequency.   Musculoskeletal:  Negative for arthralgias.  Skin:  Negative for itching and rash.  Neurological:  Negative for extremity weakness.  Hematological:  Negative for adenopathy.  Psychiatric/Behavioral:  Negative for confusion.        MEDICAL HISTORY:  Past Medical History:  Diagnosis Date   Allergic rhinoconjunctivitis    Allergies    Arthritis    Back pain    Chronic uveitis    Depression    Frequent UTI    GERD (gastroesophageal reflux disease)    HA (headache)    Hearing loss    wearing bilateral aides   History of hiatal hernia    Hypothyroidism    Lactose intolerance    Migraine    Osteopenia    Pre-diabetes    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HCC)    Sensorineural hearing loss    Tingling of both feet    Tinnitus     SURGICAL HISTORY: Past Surgical History:  Procedure Laterality Date   ABDOMINAL HYSTERECTOMY     belly  button     belly button hernia   BRONCHIAL BIOPSY  08/05/2020   Procedure: BRONCHIAL BIOPSIES;  Surgeon: Garner Nash, DO;  Location: [facility];  Service: Pulmonary;;   BRONCHIAL BRUSHINGS  08/05/2020   Procedure: BRONCHIAL BRUSHINGS;  Surgeon: Garner Nash, DO;  Location: [place];  Service: Pulmonary;;   BRONCHIAL NEEDLE ASPIRATION BIOPSY  08/05/2020   Procedure: BRONCHIAL NEEDLE ASPIRATION BIOPSIES;  Surgeon: Garner Nash, DO;  Location: [facility];  Service: Pulmonary;;   BRONCHIAL WASHINGS  08/05/2020   Procedure: BRONCHIAL WASHINGS;  Surgeon: Garner Nash, DO;  Location: [place];  Service: Pulmonary;;   c sections     CATARACT EXTRACTION Right    CHOLECYSTECTOMY     COLON RESECTION N/A 04/17/2018   Procedure: LAPAROSCOPIC COLON RESECTION POSSIBLE OSTOMY;  Surgeon: Benjamine Sprague, DO;  Location: [facility];  Service: General;  Laterality: N/A;  COLONOSCOPY  10/13/2021   COLONOSCOPY WITH PROPOFOL N/A 07/23/2020   Procedure: COLONOSCOPY WITH PROPOFOL;  Surgeon: Toledo, Benay Pike, MD;  Location: [facility];  Service: Gastroenterology;  Laterality: N/A;   DIAGNOSTIC LAPAROSCOPY     ELBOW SURGERY Left    EYE SURGERY Right    glaucoma and stent surgery   FIDUCIAL MARKER PLACEMENT  08/05/2020   Procedure: FIDUCIAL MARKER PLACEMENT;  Surgeon: Garner Nash, DO;  Location: [facility];  Service: Pulmonary;;   GANGLION CYST EXCISION Left 01/22/2016   base of thumb   GASTRIC BYPASS     GLAUCOMA SURGERY     GLAUCOMA SURGERY  12/13/2019   right eye   HERNIA REPAIR     INTERCOSTAL NERVE BLOCK Right 09/01/2020   Procedure: INTERCOSTAL NERVE BLOCK;  Surgeon: Lajuana Matte, MD;  Location: [place];  Service: Thoracic;  Laterality: Right;   LUNG REMOVAL, PARTIAL     R upper lobe   neck fusion  07/04/2017   Dr. Carloyn Manner in [place] Right 09/01/2020   Procedure: NODE DISSECTION;  Surgeon: Lajuana Matte, MD;  Location: [place];  Service:  Thoracic;  Laterality: Right;   [place] Right 08/05/2020   Procedure: VIDEO BRONCHOSCOPY WITH ENDOBRONCHIAL NAVIGATION;  Surgeon: Garner Nash, DO;  Location: [place];  Service: Pulmonary;  Laterality: Right;   WRIST SURGERY Right     SOCIAL HISTORY: Social History   Socioeconomic History   Marital status: Married    Spouse name: Not on file   Number of children: 2   Years of education: college   Highest education level: Not on file  Occupational History    Comment: retired  Tobacco Use   Smoking status: Former    Packs/day: 0.50    Years: 10.00    Additional pack years: 0.00    Total pack years: 5.00    Types: Cigarettes    Quit date: 1982    Years since quitting: 42.2   Smokeless tobacco: Never  Vaping Use   Vaping Use: Never used  Substance and Sexual Activity   Alcohol use: Not Currently    Alcohol/week: 1.0 standard drink of alcohol    Types: 1 Cans of beer per week   Drug use: No   Sexual activity: Not on file  Other Topics Concern   Not on file  Social History Narrative   Patient lives at home with her husband [name]).    Education two years of college.   Caffeine - two glasses of tea.   Social Determinants of Health   Financial Resource Strain: Not on file  Food Insecurity: Not on file  Transportation Needs: Not on file  Physical Activity: Not on file  Stress: Not on file  Social Connections: Not on file  Intimate Partner Violence: Not on file    FAMILY HISTORY: Family History  Problem Relation Age of Onset   Diabetes Father    Stroke Father    Squamous cell carcinoma Mother    Migraines Daughter    Breast cancer Paternal Aunt    Allergic rhinitis Neg Hx    Angioedema Neg Hx    Asthma Neg Hx    Eczema Neg Hx    Immunodeficiency Neg Hx    Urticaria Neg Hx     ALLERGIES:  is allergic to diazepam, sulfa antibiotics, tape, latex, and other.  MEDICATIONS:  Current Outpatient  Medications  Medication Sig Dispense  Refill   Ascorbic Acid (VITAMIN C PO) Take 1 mg by mouth 2 (two) times daily.     azelastine (ASTELIN) 0.1 % nasal spray Place 2 sprays into both nostrils daily. 90 mL 3   CALCIUM CITRATE PO Take 1,000 mg by mouth at bedtime. Bariatric Advantage     Cholecalciferol (VITAMIN D3) 125 MCG (5000 UT) CAPS Take 5,000 Units by mouth daily.     Cyanocobalamin (VITAMIN B12) 1000 MCG TBCR Take 1,000 mcg by mouth in the morning.     cyproheptadine (PERIACTIN) 4 MG tablet TAKE 1 TABLET TWICE A DAY 60 tablet 0   dorzolamide-timolol (COSOPT) 22.3-6.8 MG/ML ophthalmic solution Place 1 drop into the right eye 2 (two) times daily.     eletriptan (RELPAX) 40 MG tablet Take 1 tablet (40 mg total) by mouth every 2 (two) hours as needed for migraine. 40 mg on set May repeat in 2 hours if headache persists or recurs. Not to exceed 2 tabs/24hours. 30 tablet 3   ferrous sulfate 324 MG TBEC Take 324 mg by mouth.     folic acid (FOLVITE) 1 MG tablet Take 1 mg by mouth in the morning.     Fremanezumab-vfrm (AJOVY) 225 MG/1.5ML SOAJ Inject 225 mg into the skin every 30 (thirty) days. 4.5 mL 3   gabapentin (NEURONTIN) 400 MG capsule Take 1 capsule (400 mg total) by mouth 2 (two) times daily. 180 capsule 3   Iron Combinations (CHROMAGEN) capsule Take 1 capsule by mouth daily.     Ivermectin (SOOLANTRA) 1 % CREA Apply 1 Application topically at bedtime. Qhs to face for rosacea 135 g 4   Krill Oil 350 MG CAPS Take 350 mg by mouth daily.     levothyroxine (SYNTHROID) 75 MCG tablet Take 1 tablet (75 mcg total) by mouth daily before breakfast. 90 tablet 1   Magnesium Oxide 400 MG CAPS Take 400 mg by mouth in the morning and at bedtime.     meclizine (ANTIVERT) 25 MG tablet Take by mouth.     Multiple Vitamin (MULTIVITAMIN) tablet Take 1 tablet by mouth 2 (two) times daily. Bariatric Advantage     omeprazole (PRILOSEC) 20 MG capsule Take 1 capsule (20 mg total) by mouth in the morning and at  bedtime. 60 capsule 5   pimecrolimus (ELIDEL) 1 % cream Apply topically as directed. Qd to bid aa right shoulder until resolved 90 g 1   prednisoLONE acetate (PRED FORTE) 1 % ophthalmic suspension Place 1 drop into the right eye daily.     Probiotic Product (PROBIOTIC DAILY PO) Take by mouth.     riboflavin (VITAMIN B-2) 100 MG TABS tablet Take 100 mg by mouth in the morning and at bedtime.     solifenacin (VESICARE) 10 MG tablet Take 10 mg by mouth in the morning.     tiZANidine (ZANAFLEX) 4 MG capsule Take 4 mg by mouth 3 (three) times daily as needed for muscle spasms.     EPINEPHrine 0.3 mg/0.3 mL IJ SOAJ injection Inject 0.3 mg into the muscle as needed for anaphylaxis. (Patient not taking: Reported on 10/30/2021) 1 each 1   No current facility-administered medications for this visit.     PHYSICAL EXAMINATION: ECOG PERFORMANCE STATUS: 1 - Symptomatic but completely ambulatory Vitals:   05/04/22 1306  BP: (!) 127/91  Pulse: 65  Resp: 18  Temp: (!) 96.5 F (35.8 C)  SpO2: 96%   Filed Weights   05/04/22 1306  Weight: 215 lb 8 oz (97.8  kg)    Physical Exam Constitutional:      General: She is not in acute distress.    Appearance: She is not diaphoretic.  HENT:     Head: Normocephalic.     Nose: Nose normal.     Mouth/Throat:     Pharynx: No oropharyngeal exudate.  Eyes:     General: No scleral icterus.    Pupils: Pupils are equal, round, and reactive to light.  Cardiovascular:     Rate and Rhythm: Normal rate.     Heart sounds: No murmur heard. Pulmonary:     Effort: Pulmonary effort is normal. No respiratory distress.     Breath sounds: No wheezing.  Abdominal:     General: There is no distension.  Musculoskeletal:        General: Normal range of motion.     Cervical back: Normal range of motion.  Skin:    General: Skin is warm and dry.  Neurological:     Mental Status: She is alert and oriented to person, place, and time.     Cranial Nerves: No cranial nerve  deficit.     Motor: No abnormal muscle tone.     Coordination: Coordination normal.  Psychiatric:        Mood and Affect: Mood and affect normal.      RADIOGRAPHIC STUDIES: I have personally reviewed the radiological images as listed and agreed with the findings in the report. CT CHEST ABDOMEN PELVIS W CONTRAST  Result Date: 05/03/2022 CLINICAL DATA:  Adenocarcinoma of the sigmoid colon. Adenocarcinoma of the right lung. * Tracking Code: BO * EXAM: CT CHEST, ABDOMEN, AND PELVIS WITH CONTRAST TECHNIQUE: Multidetector CT imaging of the chest, abdomen and pelvis was performed following the standard protocol during bolus administration of intravenous contrast. RADIATION DOSE REDUCTION: This exam was performed according to the departmental dose-optimization program which includes automated exposure control, adjustment of the mA and/or kV according to patient size and/or use of iterative reconstruction technique. CONTRAST:  174mL OMNIPAQUE IOHEXOL 300 MG/ML  SOLN COMPARISON:  11/11/2021 chest CT. Most recent abdominopelvic CT 04/13/2021. FINDINGS: CT CHEST FINDINGS Cardiovascular: Aortic atherosclerosis. Normal heart size, without pericardial effusion. No central pulmonary embolism, on this non-dedicated study. Mediastinum/Nodes: No supraclavicular adenopathy. No mediastinal or hilar adenopathy. Small hiatal hernia. Contrast level in the esophagus. Lungs/Pleura: No pleural fluid.  Right upper lobectomy. No suspicious pulmonary nodule or mass. Musculoskeletal: Cervical spine fixation. Left-sided T12 vertebral hemangioma. CT ABDOMEN PELVIS FINDINGS Hepatobiliary: Mild hepatic steatosis. Nonspecific moderate caudate lobe enlargement. No focal liver lesion. Cholecystectomy, without biliary ductal dilatation. Pancreas: Stable appearance of the pancreas, with mild atrophy and fatty replacement throughout. No duct dilatation or acute inflammation. Spleen: Normal in size, without focal abnormality. Adrenals/Urinary  Tract: Normal adrenal glands. 4-5 mm inter/upper pole left renal collecting system calculus with overlying cortical scarring. Diffuse left-sided cortical thinning. Left greater than right renal sinus cysts, without hydronephrosis or hydroureter. Normal urinary bladder. 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 Large amount of stool within the rectum. Surgical sutures in the sigmoid without local recurrence. Normal terminal ileum. Diminutive appendix. Otherwise normal small bowel. Vascular/Lymphatic: Aortic atherosclerosis. Small jejunal mesenteric nodes are similar and not pathologic by size criteria. No pelvic sidewall adenopathy. No perirectal or sigmoid mesocolon adenopathy. Reproductive: Hysterectomy.  No adnexal mass. Other: No significant free fluid. No evidence of omental or peritoneal disease. No free intraperitoneal air. Musculoskeletal: Osteopenia. Lumbar spondylosis. Disc bulge at L3-4. IMPRESSION: 1. Status post right upper lobectomy and  partial sigmoidectomy. No evidence of recurrent or metastatic disease from either primary. 2. Distal colonic stool burden suggests constipation or even fecal impaction. 3. Small hiatal hernia. Esophageal air fluid level suggests dysmotility or gastroesophageal reflux. 4. Hepatic steatosis 5. Coronary artery atherosclerosis. Aortic Atherosclerosis (ICD10-I70.0). 6. Left nephrolithiasis with left-sided renal scarring. Electronically Signed   By: Abigail Miyamoto M.D.   On: 05/03/2022 16:57       LABORATORY DATA:  I have reviewed the data as listed    Latest Ref Rng & Units 04/30/2022   11:32 AM 10/27/2021   10:47 AM 04/07/2021   10:53 AM  CBC  WBC 4.0 - 10.5 K/uL 7.8  8.3  7.7   Hemoglobin 12.0 - 15.0 g/dL 13.4  11.3  12.1   Hematocrit 36.0 - 46.0 % 42.5  36.0  39.4   Platelets 150 - 400 K/uL 250  296  302       Latest Ref Rng & Units 04/30/2022   11:32 AM 10/27/2021   10:47 AM 04/07/2021   10:53 AM  CMP  Glucose 70 - 99 mg/dL 112   114  106   BUN 8 - 23 mg/dL 15  14  11    Creatinine 0.44 - 1.00 mg/dL 0.78  0.74  0.71   Sodium 135 - 145 mmol/L 137  134  137   Potassium 3.5 - 5.1 mmol/L 3.8  4.2  3.9   Chloride 98 - 111 mmol/L 105  104  105   CO2 22 - 32 mmol/L 24  24  24    Calcium 8.9 - 10.3 mg/dL 8.5  8.6  8.5   Total Protein 6.5 - 8.1 g/dL 7.2  7.1  7.1   Total Bilirubin 0.3 - 1.2 mg/dL 1.0  0.7  0.8   Alkaline Phos 38 - 126 U/L 105  110  102   AST 15 - 41 U/L 42  49  36   ALT 0 - 44 U/L 44  40  27    Preop CEA was obtained on 04/03/2018, at 1.8

## 2022-05-07 ENCOUNTER — Other Ambulatory Visit: Payer: Self-pay

## 2022-05-07 ENCOUNTER — Ambulatory Visit (INDEPENDENT_AMBULATORY_CARE_PROVIDER_SITE_OTHER): Payer: Medicare Other | Admitting: Allergy & Immunology

## 2022-05-07 ENCOUNTER — Encounter: Payer: Self-pay | Admitting: Allergy & Immunology

## 2022-05-07 VITALS — BP 116/74 | HR 96 | Temp 97.8°F | Resp 16 | Ht 66.0 in | Wt 215.4 lb

## 2022-05-07 DIAGNOSIS — J302 Other seasonal allergic rhinitis: Secondary | ICD-10-CM | POA: Diagnosis not present

## 2022-05-07 DIAGNOSIS — J3089 Other allergic rhinitis: Secondary | ICD-10-CM

## 2022-05-07 DIAGNOSIS — K219 Gastro-esophageal reflux disease without esophagitis: Secondary | ICD-10-CM

## 2022-05-07 MED ORDER — AZELASTINE HCL 0.1 % NA SOLN: 2.0000 | Freq: Every day | NASAL | 3 refills | Status: DC

## 2022-05-07 MED ORDER — OMEPRAZOLE 20 MG PO CPDR: 20.0000 mg | DELAYED_RELEASE_CAPSULE | Freq: Every day | ORAL | 3 refills | Status: AC

## 2022-05-07 MED ORDER — CYPROHEPTADINE HCL 4 MG PO TABS: 4.0000 mg | ORAL_TABLET | Freq: Every evening | ORAL | 3 refills | Status: DC | PRN

## 2022-05-07 NOTE — Progress Notes (Signed)
FOLLOW UP  Date of Service/Encounter:  05/07/22   Assessment:   Seasonal and perennial allergic rhinitis (molds, dust mites, cat and dog) - on allergen immunotherapy with recently re-mixed vials    Gastroesophageal reflux disease - doing well on daily PPI   Recent diagnosis of colon cancer - s/p partial bowel resection (recent clear screening)    Recent diagnosis of lung cancer - s/p resection (recent clear screening)    Hashimoto's thyroiditis (on levothyroxine) - previously seen by Dr. Dorris Fetch   New onset vertigo     Plan/Recommendations:   1. Perennial and seasonal allergic rhinitis (molds, cat, dog, dust mite) - We will hold off on the EpiPen per your request.  - Continue Periactin 4mg  at night. - Continue azelastine 2 sprays each nostril once a day as needed for drainage down throat - I sent in 90 day supplies of this.   2. GERD - Continue with Prilosec once daily. - 90 day supply with refills sent in.   3. Return in about 1 year (around 05/07/2023).    Subjective:   Jordan Mahoney is a 72 y.o. female presenting today for follow up of  Chief Complaint  Patient presents with   Follow-up    Does not want to order an epipen     LAYAAN Mahoney has a history of the following: Patient Active Problem List   Diagnosis Date Noted   Primary lung adenocarcinoma, right ([place]) 05/04/2022   Mixed hyperlipidemia 11/20/2021   Type 2 diabetes mellitus without complication, without long-term current use of insulin ([place]) 05/21/2021   S/P lobectomy of lung 09/01/2020   Solitary pulmonary nodule on lung CT 05/22/2020   Hypothyroidism 02/18/2020   Prediabetes 02/18/2020   History of fusion of cervical spine 12/24/2019   Chronic migraine 11/15/2019   Paresthesia 11/15/2019   Gait abnormality 11/15/2019   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  Jordan Mahoney is a 72 y.o. female presenting for a follow up visit.  She was last seen in February 2023.  At that time, we will continue with allergy shots same schedule.  We also continue with Periactin 4 mg at night and Astelin 2 sprays per nostril daily.  For her GERD, we continue with Prilosec twice daily.  Since last visit, she has done well.   Allergic Rhinitis Symptom History: Allergy shots are going well.  She is not even using her nose spray routinely. She does wake up with coughing and postnasal drip. She coughs it up in the morning. She has not been on antibiotics at all since the last visit. She has done very well on the allergy shots.   Jordan Mahoney is on allergen immunotherapy. She receives one injection. Immunotherapy script #1 contains molds, dust mites, cat and dog. She currently receives 0.3mL of the RED vial (1/100). She started shots in early 2016 and reached maintenance in September of 2016. She is doing well on her shots without any adverse events.  She prefers to remain on her allergy shots.  She has been on allergy shots for around 8 years at this time.  Vertigo is much better. She is able to  function but she has to be careful. She cannot move fast and has trouble with symptoms when she bends over. She does not have a cane and has made some changes to help with her symptoms.   She was recently diagnosed with osteopenia.  They are negative do anything else besides continued vitamin D and calcium supplement.  She does not have a history of fractures.  Her husband has had a lot of health issues.  He has unexplained abdominal pain and the workup thus far has been negative.  Otherwise, there have been no changes to her past medical history, surgical  history, family history, or social history.    Review of Systems  Constitutional: Negative.  Negative for chills, fever, malaise/fatigue and weight loss.  HENT: Negative.  Negative for congestion, ear discharge and ear pain.        Positive for vertigo.  Eyes:  Negative for pain, discharge and redness.  Respiratory:  Negative for cough, sputum production, shortness of breath and wheezing.   Cardiovascular: Negative.  Negative for chest pain and palpitations.  Gastrointestinal:  Negative for abdominal pain, constipation, diarrhea, heartburn, nausea and vomiting.  Skin: Negative.  Negative for itching and rash.  Neurological:  Negative for dizziness and headaches.  Endo/Heme/Allergies:  Negative for environmental allergies. Does not bruise/bleed easily.       Objective:   Blood pressure 116/74, pulse 96, temperature 97.8 F (36.6 C), resp. rate 16, height 5\' 6"  (1.676 m), weight 215 lb 6 oz (97.7 kg), SpO2 96 %. Body mass index is 34.76 kg/m.    Physical Exam Vitals reviewed.  Constitutional:      Appearance: She is well-developed.  HENT:     Head: Normocephalic and atraumatic.     Right Ear: Tympanic membrane, ear canal and external ear normal.     Left Ear: Tympanic membrane, ear canal and external ear normal.     Ears:     Comments: Hearing aids in place.    Nose: No nasal deformity, septal deviation, mucosal edema or rhinorrhea.     Right Turbinates: Enlarged, swollen and pale.     Left Turbinates: Enlarged, swollen and pale.     Right Sinus: No maxillary sinus tenderness or frontal sinus tenderness.     Left Sinus: No maxillary sinus tenderness or frontal sinus tenderness.     Comments: No polyps present.     Mouth/Throat:     Lips: Pink.     Mouth: Mucous membranes are moist. Mucous membranes are not pale and not dry.     Pharynx: Uvula midline.     Tonsils: No tonsillar exudate or tonsillar abscesses. 2+ on the right. 2+ on the left.  Eyes:     General: Lids are  normal. Allergic shiner present.        Right eye: No discharge.        Left eye: No discharge.     Conjunctiva/sclera: Conjunctivae normal.     Right eye: Right conjunctiva is not injected. No chemosis.    Left eye: Left conjunctiva is not injected. No chemosis.    Pupils: Pupils are equal, round, and reactive to light.  Cardiovascular:     Rate and Rhythm: Normal rate and regular rhythm.     Heart sounds: Normal heart sounds.  Pulmonary:     Effort: Pulmonary effort is normal. No tachypnea, accessory muscle usage or respiratory distress.     Breath sounds: Normal breath sounds. No wheezing, rhonchi or rales.  Comments: Moving air well in all lung fields.  No increased work of breathing. Chest:     Chest wall: No tenderness.  Lymphadenopathy:     Cervical: No cervical adenopathy.  Skin:    General: Skin is warm.     Capillary Refill: Capillary refill takes less than 2 seconds.     Coloration: Skin is not pale.     Findings: No abrasion, erythema, petechiae or rash. Rash is not papular, urticarial or vesicular.  Neurological:     Mental Status: She is alert.  Psychiatric:        Behavior: Behavior is cooperative.      Diagnostic studies: none       Salvatore Marvel, MD  [facility]

## 2022-05-07 NOTE — Patient Instructions (Addendum)
1. Perennial and seasonal allergic rhinitis (molds, cat, dog, dust mite) - We will hold off on the EpiPen per your request.  - Continue Periactin 4mg  at night. - Continue azelastine 2 sprays each nostril once a day as needed for drainage down throat - I sent in 90 day supplies of this.   2. GERD - Continue with Prilosec once daily. - 90 day supply with refills sent in.   3. Return in about 1 year (around 05/07/2023).    Please inform us of any Emergency Department visits, hospitalizations, or changes in symptoms. Call us before going to the ED for breathing or allergy symptoms since we might be able to fit you in for a sick visit. Feel free to contact us anytime with any questions, problems, or concerns.  It was a pleasure to see you again today!  Websites that have reliable patient information: 1. American Academy of Asthma, Allergy, and Immunology: www.aaaai.org 2. Food Allergy Research and Education (FARE): foodallergy.org 3. Mothers of Asthmatics: http://www.asthmacommunitynetwork.org 4. American College of Allergy, Asthma, and Immunology: www.acaai.org   COVID-19 Vaccine Information can be found at: [email or web address].[email or web address].uk For questions related to vaccine distribution or appointments, please email [email or web address] or call [phone].   We realize that you might be concerned about having an allergic reaction to the COVID19 vaccines. To help with that concern, WE ARE OFFERING THE COVID19 VACCINES IN OUR OFFICE! Ask the front desk for dates!     "Like" [place] on Facebook and Instagram for our latest updates!      A healthy democracy works best when [employer] participate! Make sure you are registered to vote! If you have moved or changed any of your contact information, you will need to get this updated before voting!  In some cases, you MAY be able to register to vote online:  [email or web address]

## 2022-05-26 ENCOUNTER — Ambulatory Visit (INDEPENDENT_AMBULATORY_CARE_PROVIDER_SITE_OTHER): Payer: Medicare Other

## 2022-05-26 DIAGNOSIS — J309 Allergic rhinitis, unspecified: Secondary | ICD-10-CM

## 2022-06-23 ENCOUNTER — Ambulatory Visit (INDEPENDENT_AMBULATORY_CARE_PROVIDER_SITE_OTHER): Payer: Medicare Other

## 2022-06-23 DIAGNOSIS — J309 Allergic rhinitis, unspecified: Secondary | ICD-10-CM | POA: Diagnosis not present

## 2022-06-29 ENCOUNTER — Encounter: Payer: Self-pay | Admitting: Neurology

## 2022-06-29 ENCOUNTER — Ambulatory Visit (INDEPENDENT_AMBULATORY_CARE_PROVIDER_SITE_OTHER): Payer: Medicare Other | Admitting: Neurology

## 2022-06-29 VITALS — BP 114/88 | HR 85 | Ht 66.25 in | Wt 215.0 lb

## 2022-06-29 DIAGNOSIS — G43909 Migraine, unspecified, not intractable, without status migrainosus: Secondary | ICD-10-CM

## 2022-06-29 MED ORDER — ELETRIPTAN HYDROBROMIDE 40 MG PO TABS: 40.0000 mg | ORAL_TABLET | ORAL | 3 refills | Status: DC | PRN

## 2022-06-29 MED ORDER — GABAPENTIN 400 MG PO CAPS: 400.0000 mg | ORAL_CAPSULE | Freq: Two times a day (BID) | ORAL | 3 refills | Status: DC

## 2022-06-29 MED ORDER — EMGALITY 120 MG/ML ~~LOC~~ SOAJ: 120.0000 mg | SUBCUTANEOUS | 11 refills | Status: DC

## 2022-06-29 NOTE — Patient Instructions (Addendum)
Stop the Ajovy, we will switch to Emgality to see if better benefit for migraine prevention   Meds ordered this encounter  Medications   Galcanezumab-gnlm (EMGALITY) 120 MG/ML SOAJ    Sig: Inject 120 mg into the skin every 30 (thirty) days.    Dispense:  1 mL    Refill:  11   gabapentin (NEURONTIN) 400 MG capsule    Sig: Take 1 capsule (400 mg total) by mouth 2 (two) times daily.    Dispense:  180 capsule    Refill:  3   eletriptan (RELPAX) 40 MG tablet    Sig: Take 1 tablet (40 mg total) by mouth every 2 (two) hours as needed for migraine. 40 mg on set May repeat in 2 hours if headache persists or recurs. Not to exceed 2 tabs/24hours.    Dispense:  30 tablet    Refill:  3    Must last 90 days

## 2022-06-29 NOTE — Progress Notes (Signed)
PATIENT: Jordan Mahoney DOB: 1950-03-01  REASON FOR VISIT: follow up for migraine HISTORY FROM: patient PRIMARY NEUROLOGIST: Dr. Terrace Mahoney  HISTORY Jordan Mahoney is a 72 year old female, seen in request by her primary care nurse practitioner Jordan Mahoney for evaluation of low back pain, numbness tingling in feet    I reviewed and summarized the referring note.  Past medical history Chronic migraine headaches, Neck fusion in 2019, for bilateral upper extremity paresthesia and weakness, she also had mild gait abnormality, surgery did help her symptoms, but she still has residual bilateral hands paresthesia.  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   She is taking methotrexate for recurrent uveitis, is under good control, also reported a history of   She also has a long history of chronic migraine headaches, taking frequent Relpax as needed, which has helped her symptoms.   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  Realized, her Air Adjusted Bed was too firm, lowered the pressure 3 weeks ago symptoms are greatly improving.  No falls,  balance is stable.  Migraines: Doing well with Ajovy, continues to have headache with barometric pressure change, but is much less intense.  Has been weaning down Topamax taking 100 mg twice daily every other day, wants to be off.  Reserves Relpax for only severe headaches, if she has to go out.   On gabapentin 400 mg twice a day, for paresthesia to hands post cervical fusion surgery in May 2019 with Dr. Channing Mahoney.  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  Here today with her husband.  Labs at last visit showed elevated A1c 6.0, has been eating more candy.  TSH was elevated, endocrinologist has initiated Synthroid.  Update Jun 23, 2020 SS: Here today with her husband, has recently been diagnosed with Hashimoto's, is on Synthroid.  Migraines have been well controlled with Ajovy injection, tolerating well.  No headaches, up until this weekend, with weather change and storms.  Relpax works well, within 20 to 45 minutes.  Is off Topamax.  Remains on gabapentin 400 mg twice daily.  Helps with paresthesia in the hands.  Paresthesia to lower extremities has resolved with sleep number bed adjustment.   Update Jun 23, 2021 SS: Remains on gabapentin 400 mg twice daily, occasionally tingling in both hands, no longer in legs. On Ajovy, still benefiting,triggers with weather change, right now often headaches because weather change, but intensity is less, doesn't need Relpax as often, usually has 6 tablets a month left. Never has to repeat dose of Relpax. In July 2022, had right upper lung lobe removed for lung cancer. Here with husband. Has vertigo doing vestibular rehab, waiting for referral for more visits, didn't continue home exercises  Update Jul 01, 2022 SS:  Still on Ajovy. This month is a bad month with weather change, season change, she doesn't count them. Uses Relpax, works very well, headaches depend on weather pressure system. Only gets 10 a month, doesn't take all monthly. Takes gabapentin for upper extremity  numbness, tingling.  REVIEW OF SYSTEMS: Out of a complete 14 system review of symptoms, the patient complains only of the following symptoms, and all other reviewed systems are negative.  See HPI  ALLERGIES: Allergies  Allergen Reactions   Diazepam Anaphylaxis    Violent dreams  Other Reaction(s): Not available   Latex Hives and Rash    Other Reaction(s): Not available   Molds & Smuts Anaphylaxis   Other Itching, Rash, Hives and Anaphylaxis    Metal contact allergy  Blister   Sulfa Antibiotics Other (See Comments) and Anaphylaxis    Causes skin reaction ( on her mid back)  Pigmentation.   A fixed drug reaction  Other Reaction(s): Not available   Tape Other (See Comments)    Blister when pulled of or stays on too long    HOME MEDICATIONS: Outpatient Medications Prior to Visit  Medication Sig Dispense Refill   Ascorbic Acid (VITAMIN C PO) Take 1 mg by mouth 2 (two) times daily.     azelastine (ASTELIN) 0.1 % nasal spray Place 2 sprays into both nostrils daily. 90 mL 3   CALCIUM CITRATE PO Take 1,000 mg by mouth at bedtime. Bariatric Advantage     Cholecalciferol (VITAMIN D3) 125 MCG (5000 UT) CAPS Take 5,000 Units by mouth daily.     Cyanocobalamin (VITAMIN B12) 1000 MCG TBCR Take 1,000 mcg by mouth in the morning.     cyproheptadine (PERIACTIN) 4 MG tablet Take 1 tablet (4 mg total) by mouth at bedtime as needed for allergies. 90 tablet 3   dorzolamide-timolol (COSOPT) 22.3-6.8 MG/ML ophthalmic solution Place 1 drop into the right eye 2 (two) times daily.     ferrous sulfate 324 MG TBEC Take 324 mg by mouth.     folic acid (FOLVITE) 1 MG tablet Take 1 mg by mouth in the morning.     Iron Combinations (CHROMAGEN) capsule Take 1 capsule by mouth daily.     Ivermectin (SOOLANTRA) 1 % CREA Apply 1 Application topically at bedtime. Qhs to face for rosacea 135 g 4   Krill Oil 350 MG CAPS Take 350 mg by mouth daily.     levothyroxine (SYNTHROID) 75 MCG tablet Take 1 tablet (75  mcg total) by mouth daily before breakfast. 90 tablet 1   Magnesium Oxide 400 MG CAPS Take 400 mg by mouth in the morning and at bedtime.     meclizine (ANTIVERT) 25 MG tablet Take by mouth.     Multiple Vitamin (MULTIVITAMIN) tablet Take 1 tablet by mouth 2 (two) times daily. Bariatric Advantage     omeprazole (PRILOSEC) 20 MG capsule Take 1 capsule (20 mg total) by mouth daily. 90 capsule 3   pimecrolimus (ELIDEL) 1 % cream Apply topically as directed. Qd to bid aa right shoulder until resolved 90 g 1   prednisoLONE acetate (PRED FORTE) 1 % ophthalmic suspension Place 1 drop into the right eye daily.     Probiotic Product (PROBIOTIC DAILY PO) Take by mouth.     riboflavin (VITAMIN B-2) 100 MG TABS tablet Take 100 mg by mouth in the morning and at bedtime.     solifenacin (VESICARE) 10 MG tablet Take 10 mg by mouth in the morning.  tiZANidine (ZANAFLEX) 4 MG capsule Take 4 mg by mouth 3 (three) times daily as needed for muscle spasms.     eletriptan (RELPAX) 40 MG tablet Take 1 tablet (40 mg total) by mouth every 2 (two) hours as needed for migraine. 40 mg on set May repeat in 2 hours if headache persists or recurs. Not to exceed 2 tabs/24hours. 30 tablet 3   Fremanezumab-vfrm (AJOVY) 225 MG/1.5ML SOAJ Inject 225 mg into the skin every 30 (thirty) days. 4.5 mL 3   gabapentin (NEURONTIN) 400 MG capsule Take 1 capsule (400 mg total) by mouth 2 (two) times daily. 180 capsule 3   No facility-administered medications prior to visit.    PAST MEDICAL HISTORY: Past Medical History:  Diagnosis Date   Allergic rhinoconjunctivitis    Allergies    Arthritis    Back pain    Chronic uveitis    Depression    Frequent UTI    GERD (gastroesophageal reflux disease)    HA (headache)    Hearing loss    wearing bilateral aides   History of hiatal hernia    Hypothyroidism    Lactose intolerance    Migraine    Osteopenia    Pre-diabetes    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HCC)    Sensorineural hearing loss     Tingling of both feet    Tinnitus     PAST SURGICAL HISTORY: Past Surgical History:  Procedure Laterality Date   ABDOMINAL HYSTERECTOMY     belly button     belly button hernia   BRONCHIAL BIOPSY  08/05/2020   Procedure: BRONCHIAL BIOPSIES;  Surgeon: Josephine Igo, DO;  Location: [facility];  Service: Pulmonary;;   BRONCHIAL BRUSHINGS  08/05/2020   Procedure: BRONCHIAL BRUSHINGS;  Surgeon: Josephine Igo, DO;  Location: [facility];  Service: Pulmonary;;   BRONCHIAL NEEDLE ASPIRATION BIOPSY  08/05/2020   Procedure: BRONCHIAL NEEDLE ASPIRATION BIOPSIES;  Surgeon: Josephine Igo, DO;  Location: [facility];  Service: Pulmonary;;   BRONCHIAL WASHINGS  08/05/2020   Procedure: BRONCHIAL WASHINGS;  Surgeon: Josephine Igo, DO;  Location: [facility];  Service: Pulmonary;;   c sections     CATARACT EXTRACTION Right    CHOLECYSTECTOMY     COLON RESECTION N/A 04/17/2018   Procedure: LAPAROSCOPIC COLON RESECTION POSSIBLE OSTOMY;  Surgeon: Sung Amabile, DO;  Location: [facility];  Service: General;  Laterality: N/A;   COLONOSCOPY  10/13/2021   COLONOSCOPY WITH PROPOFOL N/A 07/23/2020   Procedure: COLONOSCOPY WITH PROPOFOL;  Surgeon: Toledo, Boykin Nearing, MD;  Location: [facility];  Service: Gastroenterology;  Laterality: N/A;   DIAGNOSTIC LAPAROSCOPY     ELBOW SURGERY Left    EYE SURGERY Right    glaucoma and stent surgery   FIDUCIAL MARKER PLACEMENT  08/05/2020   Procedure: FIDUCIAL MARKER PLACEMENT;  Surgeon: Josephine Igo, DO;  Location: [facility];  Service: Pulmonary;;   GANGLION CYST EXCISION Left 01/22/2016   base of thumb   GASTRIC BYPASS     GLAUCOMA SURGERY     GLAUCOMA SURGERY  12/13/2019   right eye   HERNIA REPAIR     INTERCOSTAL NERVE BLOCK Right 09/01/2020   Procedure: INTERCOSTAL NERVE BLOCK;  Surgeon: Corliss Skains, MD;  Location: [facility];  Service: Thoracic;  Laterality: Right;   LUNG REMOVAL, PARTIAL     R upper lobe   neck fusion   07/04/2017   Dr. Channing Mahoney in [place]   NODE DISSECTION Right 09/01/2020   Procedure: NODE DISSECTION;  Surgeon: Corliss Skains, MD;  Location: [facility];  Service: Thoracic;  Laterality: Right;   SPINE SURGERY     VIDEO BRONCHOSCOPY WITH ENDOBRONCHIAL NAVIGATION Right 08/05/2020   Procedure: VIDEO BRONCHOSCOPY WITH ENDOBRONCHIAL NAVIGATION;  Surgeon: Josephine Igo, DO;  Location: [facility];  Service: Pulmonary;  Laterality: Right;   WRIST SURGERY Right     FAMILY HISTORY: Family History  Problem Relation Age of Onset   Diabetes Father    Stroke Father    Squamous cell carcinoma Mother    Migraines Daughter    Breast cancer Paternal Aunt    Allergic rhinitis Neg Hx    Angioedema Neg Hx    Asthma Neg Hx    Eczema Neg Hx    Immunodeficiency Neg Hx    Urticaria Neg Hx     SOCIAL HISTORY: Social History   Socioeconomic History   Marital status: Married    Spouse name: Not on file   Number of children: 2   Years of education: college   Highest education level: Not on file  Occupational History    Comment: retired  Tobacco Use   Smoking status: Former    Packs/day: 0.50    Years: 10.00    Additional pack years: 0.00    Total pack years: 5.00    Types: Cigarettes    Quit date: 1982    Years since quitting: 42.3   Smokeless tobacco: Never  Vaping Use   Vaping Use: Never used  Substance and Sexual Activity   Alcohol use: Not Currently    Alcohol/week: 1.0 standard drink of alcohol    Types: 1 Cans of beer per week   Drug use: No   Sexual activity: Not on file  Other Topics Concern   Not on file  Social History Narrative   Patient lives at home with her husband Leonard Schwartz).    Education two years of college.   Caffeine - two glasses of tea.   Social Determinants of Health   Financial Resource Strain: Not on file  Food Insecurity: Not on file  Transportation Needs: Not on file  Physical Activity: Not on file  Stress: Not on file  Social Connections: Not on file   Intimate Partner Violence: Not on file   PHYSICAL EXAM  Vitals:   06/29/22 0928  BP: 114/88  Pulse: 85  Weight: 215 lb (97.5 kg)  Height: 5' 6.25" (1.683 m)   Body mass index is 34.44 kg/m.  Generalized: Well developed, in no acute distress   Neurological examination  Mentation: Alert oriented to time, place, history taking. Follows all commands speech and language fluent Cranial nerve II-XII: Right pupil 4 mm, left 3 mm, right is post surgery. Extraocular movements were full, visual field were full on confrontational test. Facial sensation and strength were normal. Head turning and shoulder shrug  were normal and symmetric. Motor: The motor testing reveals 5 over 5 strength of all 4 extremities. Good symmetric motor tone is noted throughout.  Sensory: Sensory testing is intact to soft touch on all 4 extremities. No evidence of extinction is noted.  Coordination: Cerebellar testing reveals good finger-nose-finger and heel-to-shin bilaterally.  Gait and station: Gait is slightly wide-based, cautious, but independent Reflexes: Normal throughout  DIAGNOSTIC DATA (LABS, IMAGING, TESTING) - I reviewed patient records, labs, notes, testing and imaging myself where available.  Lab Results  Component Value Date   WBC 7.8 04/30/2022   HGB 13.4 04/30/2022   HCT 42.5 04/30/2022   MCV 88.0  04/30/2022   PLT 250 04/30/2022      Component Value Date/Time   NA 137 04/30/2022 1132   K 3.8 04/30/2022 1132   CL 105 04/30/2022 1132   CO2 24 04/30/2022 1132   GLUCOSE 112 (H) 04/30/2022 1132   BUN 15 04/30/2022 1132   CREATININE 0.78 04/30/2022 1132   CALCIUM 8.5 (L) 04/30/2022 1132   PROT 7.2 04/30/2022 1132   PROT 6.8 11/15/2019 1048   ALBUMIN 3.9 04/30/2022 1132   AST 42 (H) 04/30/2022 1132   ALT 44 04/30/2022 1132   ALKPHOS 105 04/30/2022 1132   BILITOT 1.0 04/30/2022 1132   GFRNONAA >60 04/30/2022 1132   GFRAA >60 10/29/2019 1238   Lab Results  Component Value Date   CHOL  197 11/16/2021   HDL 51 11/16/2021   LDLCALC 115 (H) 11/16/2021   TRIG 178 (H) 11/16/2021   CHOLHDL 3.9 11/16/2021   Lab Results  Component Value Date   HGBA1C 7.0 11/20/2021   Lab Results  Component Value Date   VITAMINB12 >2000 (H) 11/15/2019   Lab Results  Component Value Date   TSH 1.610 11/16/2021   ASSESSMENT AND PLAN 72 y.o. year old female  1.  New onset bilateral feet paresthesia 2.  History of cervical decompression surgery 3.  History of sigmoid colon resection in March 2020 4.  History right upper lung lobectomy July 2022 -NCV/EMG in October 2021 was normal of LLE -CEA, CBC, CMP, B12, RPR, folate, CRP, CK, ESR, ANA, MM panel, Vit D, were unremarkable, A1c was elevated 6.0, TSH elevated 5.940 -Continue gabapentin 400 mg twice a day for upper extremity paresthesia, lower extremity resolved   5.  Chronic migraine headaches -Feels benefit of Ajovy has worn off, will switch to Emgality 120 mg monthly injection for migraine prevention, cannot use Aimovig due to allergy to latex -Continue Relpax as needed for severe headache -Try to keep track of migraines -Next steps: Qulipta, Nurtec, is not interested in Botox -Follow-up in 6 months or sooner if needed  Margie Ege, AGNP-C, DNP 06/29/2022, 10:14 AM [facility] [address], [address] [place], [place] [address] [phone]

## 2022-07-09 IMAGING — CT CT CHEST-ABD-PELV W/ CM
2 of 5 series · 12 of 36 positions shown, 14 images · IV contrast (agent unspecified)
Comparison: Chest CT 07/29/2020.

CLINICAL DATA: 70-year-old female with history of colon cancer and
lung cancer status post surgical resection. Follow-up study.

* onc *
EXAM:
CT CHEST, ABDOMEN, AND PELVIS WITH CONTRAST
TECHNIQUE: Multidetector CT imaging of the chest, abdomen and pelvis was
performed following the standard protocol during bolus
administration of intravenous contrast.

[Series 2: cap with · axial · 0.82mm/px · z∈[-970,-440]mm · 9 of 134 slices shown, 11 images]
[im 14/134  mediastinal]
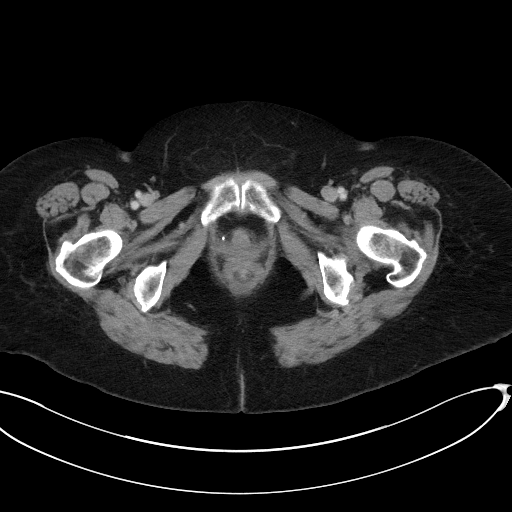
[im 14/134  bone]
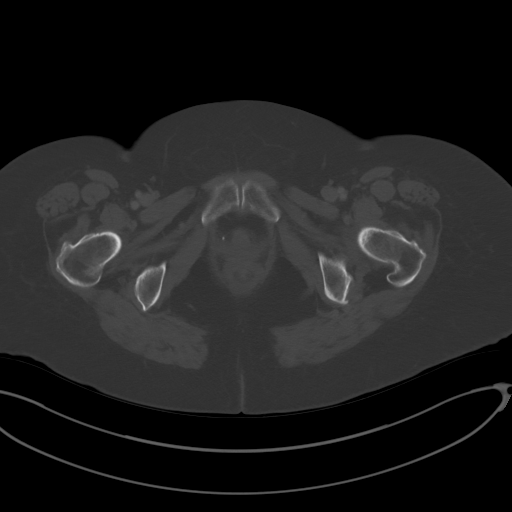
[im 27/134  mediastinal]
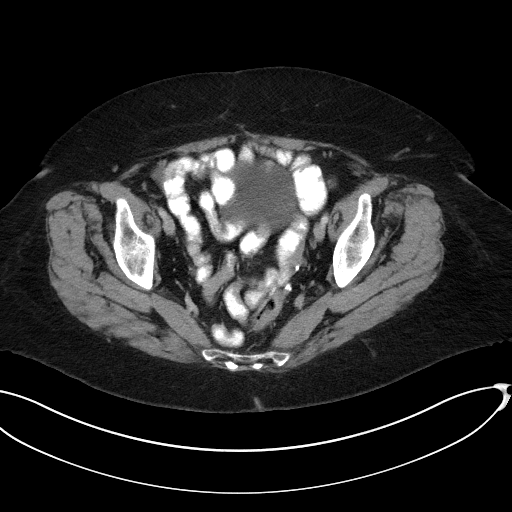
[im 40/134  mediastinal]
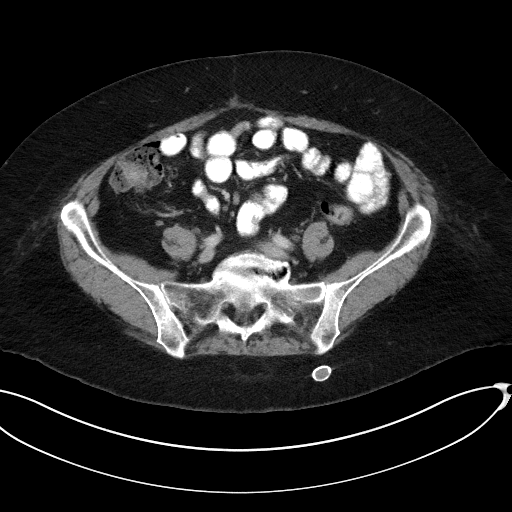
[im 54/134  mediastinal]
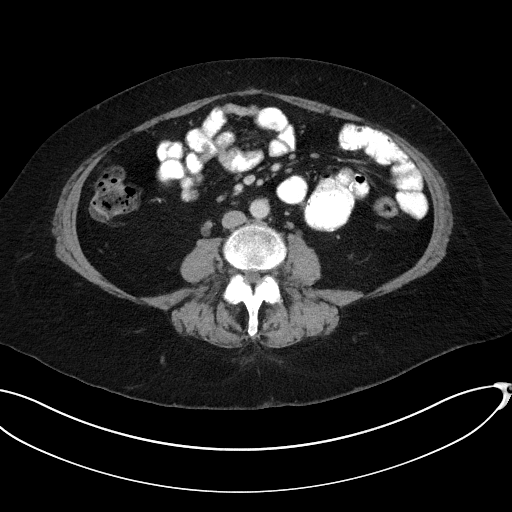
[im 67/134  mediastinal]
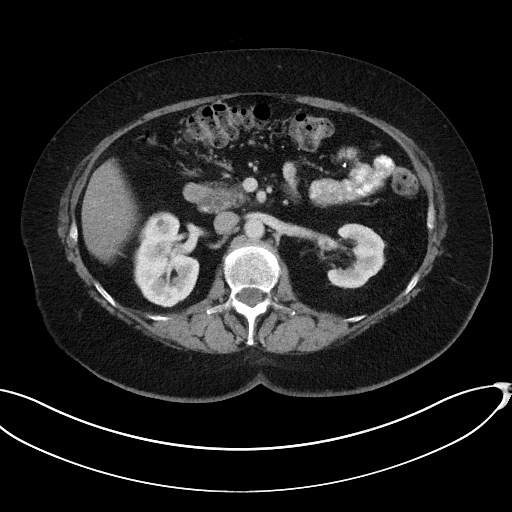
[im 80/134  mediastinal]
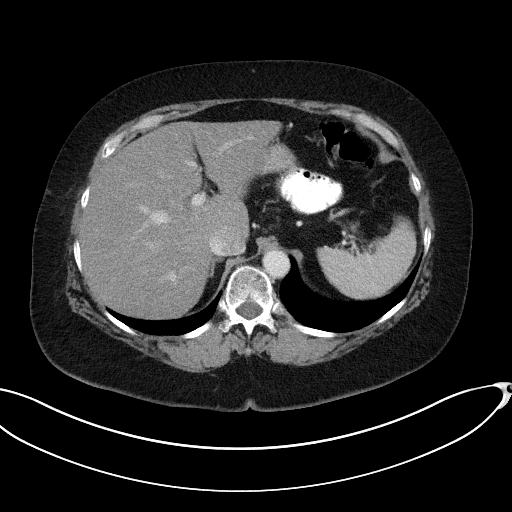
[im 94/134  mediastinal]
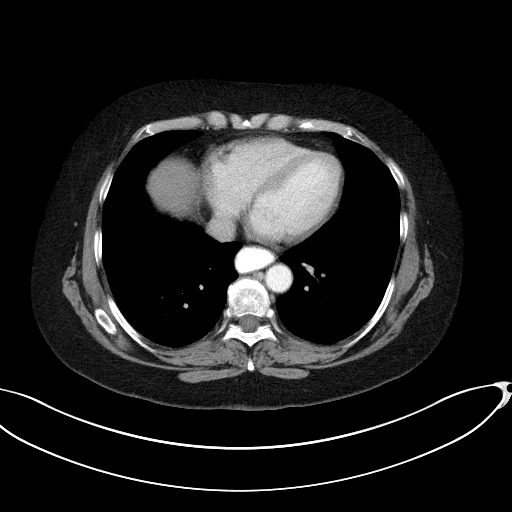
[im 107/134  mediastinal]
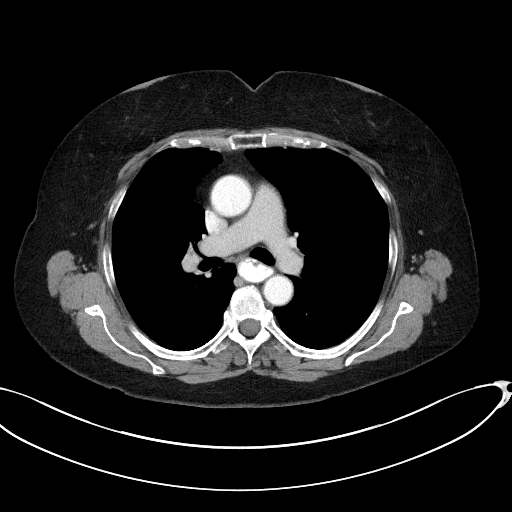
[im 120/134  mediastinal]
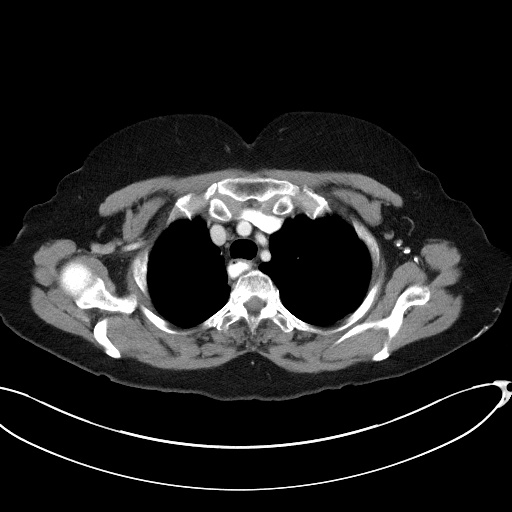
[im 120/134  bone]
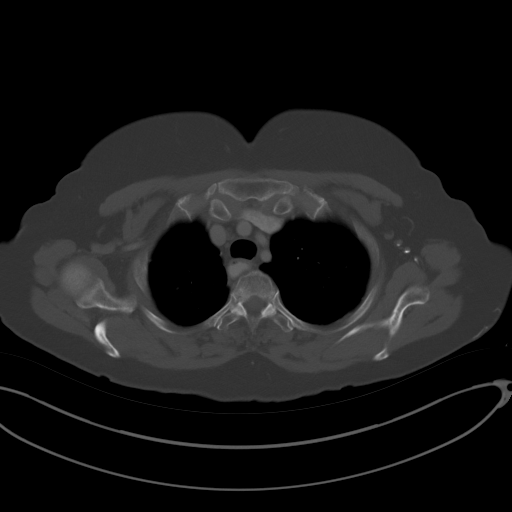

[Series 5: coronals · coronal · 0.85mm/px · 3 of 141 slices shown]
[im 29/141  mediastinal]
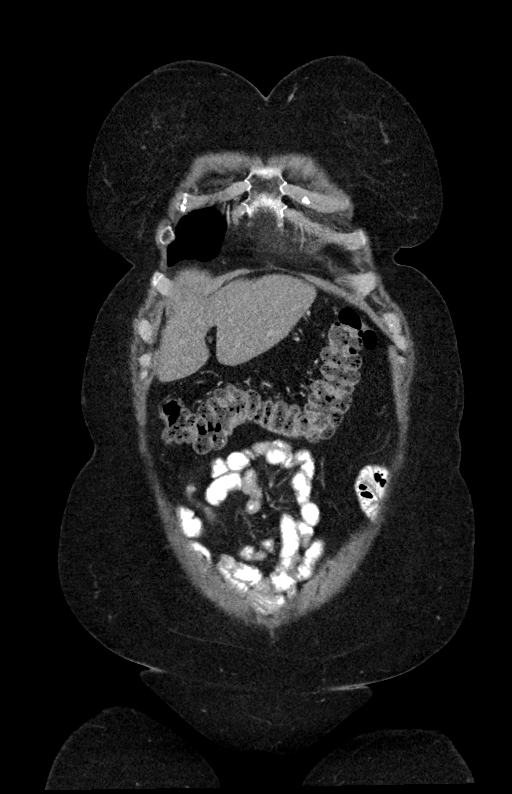
[im 57/141  mediastinal]
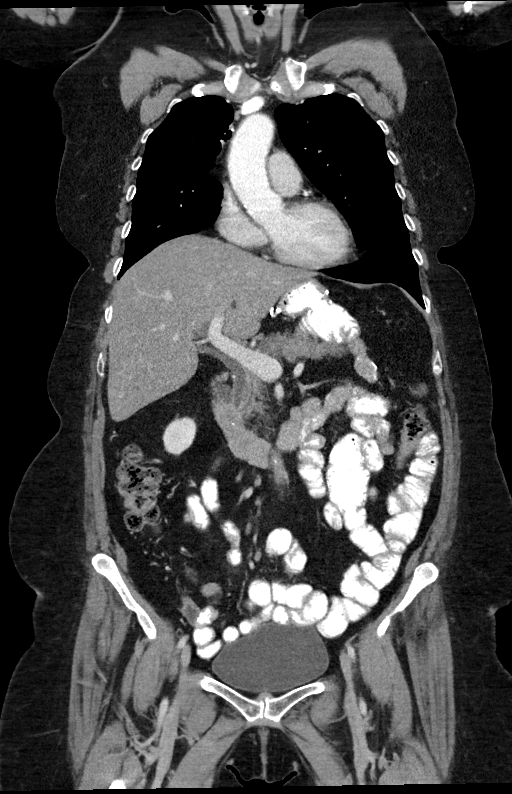
[im 85/141  mediastinal]
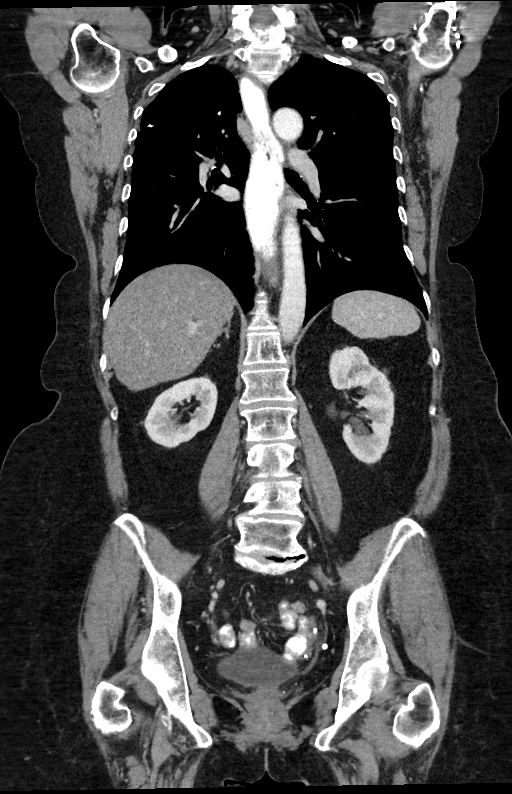

[12 of 36 positions shown; findings below may reference images not displayed]

RADIATION DOSE REDUCTION: This exam was performed according to the
departmental dose-optimization program which includes automated
exposure control, adjustment of the mA and/or kV according to
patient size and/or use of iterative reconstruction technique.

CONTRAST:  100mL OMNIPAQUE IOHEXOL 300 MG/ML  SOLN
PET-CT 06/27/2020. CT the chest,
abdomen and pelvis 04/28/2020. Multiple other prior examinations.
FINDINGS: CT CHEST FINDINGS

Cardiovascular: Heart size is normal. There is no significant
pericardial fluid, thickening or pericardial calcification. There is
aortic atherosclerosis, as well as atherosclerosis of the great
vessels of the mediastinum and the coronary arteries, including
calcified atherosclerotic plaque in the left anterior descending
coronary artery.

Mediastinum/Nodes: No pathologically enlarged mediastinal or hilar
lymph nodes. Moderate-sized hiatal hernia. No axillary
lymphadenopathy.

Lungs/Pleura: Status post right upper lobectomy. Compensatory
hyperexpansion of the right middle and lower lobes. No suspicious
appearing pulmonary nodules or masses are noted. No acute
consolidative airspace disease. No pleural effusions.

Musculoskeletal: Orthopedic fixation hardware in the lower cervical
spine incidentally noted. There are no aggressive appearing lytic or
blastic lesions noted in the visualized portions of the skeleton.

CT ABDOMEN PELVIS FINDINGS

Hepatobiliary: No suspicious cystic or solid hepatic lesions. No
intra or extrahepatic biliary ductal dilatation. Status post
cholecystectomy.

Pancreas: No pancreatic mass. No pancreatic ductal dilatation. No
pancreatic or peripancreatic fluid collections or inflammatory
changes.

Spleen: Unremarkable.

Adrenals/Urinary Tract: 4 mm nonobstructive calculus in the upper
pole collecting system of the left kidney with overlying cortical
scarring, similar to the prior examination. Subcentimeter
low-attenuation lesion in the upper pole of the right kidney, too
small to characterize, but similar to the prior study and
statistically likely to represent a small cyst. Bilateral adrenal
glands are normal in appearance. No hydroureteronephrosis. Urinary
bladder is normal in appearance.

Stomach/Bowel: Postoperative changes of Roux-en-Y gastric bypass. No
pathologic dilatation of small bowel or colon. Suture line near the
rectosigmoid junction from prior partial colectomy. No unexpected
soft tissue mass at the suture line to suggest locally recurrent
disease. Normal appendix.

Vascular/Lymphatic: Aortic atherosclerosis, without evidence of
aneurysm or dissection in the abdominal or pelvic vasculature. No
lymphadenopathy noted in the abdomen or pelvis.

Reproductive: Status post hysterectomy. Ovaries are not confidently
identified may be surgically absent or atrophic.

Other: No significant volume of ascites.  No pneumoperitoneum.

Musculoskeletal: There are no aggressive appearing lytic or blastic
lesions noted in the visualized portions of the skeleton.
IMPRESSION: 1. No evidence to suggest metastatic disease to the chest, abdomen
or pelvis.
2. Status post right upper lobectomy.
3. Status post partial colectomy at the rectosigmoid junction.
4. Aortic atherosclerosis, in addition to left anterior descending
coronary artery disease. Please note that although the presence of
coronary artery calcium documents the presence of coronary artery
disease, the severity of this disease and any potential stenosis
cannot be assessed on this non-gated CT examination. Assessment for
potential risk factor modification, dietary therapy or pharmacologic
therapy may be warranted, if clinically indicated.
5. 4 mm nonobstructive calculus in the upper pole collecting system
of left kidney.
6. Additional postoperative changes and incidental findings, as
above.

## 2022-07-20 ENCOUNTER — Ambulatory Visit (INDEPENDENT_AMBULATORY_CARE_PROVIDER_SITE_OTHER): Payer: Medicare Other | Admitting: Dermatology

## 2022-07-20 VITALS — BP 129/77

## 2022-07-20 DIAGNOSIS — L719 Rosacea, unspecified: Secondary | ICD-10-CM

## 2022-07-20 DIAGNOSIS — L649 Androgenic alopecia, unspecified: Secondary | ICD-10-CM

## 2022-07-20 DIAGNOSIS — L820 Inflamed seborrheic keratosis: Secondary | ICD-10-CM

## 2022-07-20 DIAGNOSIS — R238 Other skin changes: Secondary | ICD-10-CM

## 2022-07-20 MED ORDER — FINASTERIDE 5 MG PO TABS: 5.0000 mg | ORAL_TABLET | Freq: Every day | ORAL | 1 refills | Status: DC

## 2022-07-20 MED ORDER — MINOXIDIL 2.5 MG PO TABS: 2.5000 mg | ORAL_TABLET | Freq: Every day | ORAL | 1 refills | Status: DC

## 2022-07-20 NOTE — Patient Instructions (Addendum)
Cryotherapy Aftercare  Wash gently with soap and water everyday.   Apply Vaseline and Band-Aid daily until healed.   Minoxidil 2.5mg  1/2 pill a day for a month, if not having any problems/ side effects can increase to 1 pill a day Finasteride 5mg  1/2 pill a day for a month, if not having any problems/side effects can increase to 1 pill a day  Doses of minoxidil for hair loss are considered 'low dose'. This is because the doses used for hair loss are much lower than the doses which are used for conditions such as high blood pressure (hypertension). The doses used for hypertension are 10-40mg  per day.  Side effects are uncommon at the low doses (up to 2.5 mg/day) used to treat hair loss. Potential side effects, more commonly seen at higher doses, include: Increase in hair growth (hypertrichosis) elsewhere on face and body Temporary hair shedding upon starting medication which may last up to 4 weeks Ankle swelling, fluid retention, rapid weight gain more than 5 pounds Low blood pressure and feeling lightheaded or dizzy when standing up quickly Fast or irregular heartbeat Headaches     Due to recent changes in healthcare laws, you may see results of your pathology and/or laboratory studies on MyChart before the doctors have had a chance to review them. We understand that in some cases there may be results that are confusing or concerning to you. Please understand that not all results are received at the same time and often the doctors may need to interpret multiple results in order to provide you with the best plan of care or course of treatment. Therefore, we ask that you please give [place] 2 business days to thoroughly review all your results before contacting the office for clarification. Should we see a critical lab result, you will be contacted sooner.   If You Need Anything After Your Visit  If you have any questions or concerns for your doctor, please call our main line at [phone] and  press option 4 to reach your doctor's medical assistant. If no one answers, please leave a voicemail as directed and we will return your call as soon as possible. Messages left after 4 pm will be answered the following business day.   You may also send [place] a message via MyChart. We typically respond to MyChart messages within 1-2 business days.  For prescription refills, please ask your pharmacy to contact our office. Our fax number is [phone].  If you have an urgent issue when the clinic is closed that cannot wait until the next business day, you can page your doctor at the number below.    Please note that while we do our best to be available for urgent issues outside of office hours, we are not available 24/7.   If you have an urgent issue and are unable to reach [place], you may choose to seek medical care at your doctor's office, retail clinic, urgent care center, or emergency room.  If you have a medical emergency, please immediately call 911 or go to the emergency department.  Pager Numbers  - Dr. Gwen Pounds: [phone]  - Dr. Neale Burly: [phone]  - Dr. Roseanne Reno: [phone]  In the event of inclement weather, please call our main line at [phone] for an update on the status of any delays or closures.  Dermatology Medication Tips: Please keep the boxes that topical medications come in in order to help keep track of the instructions about where and how to use these. Pharmacies typically  print the medication instructions only on the boxes and not directly on the medication tubes.   If your medication is too expensive, please contact our office at [phone] option 4 or send [place] a message through MyChart.   We are unable to tell what your co-pay for medications will be in advance as this is different depending on your insurance coverage. However, we may be able to find a substitute medication at lower cost or fill out paperwork to get insurance to cover a needed medication.   If  a prior authorization is required to get your medication covered by your insurance company, please allow [place] 1-2 business days to complete this process.  Drug prices often vary depending on where the prescription is filled and some pharmacies may offer cheaper prices.  The website www.goodrx.com contains coupons for medications through different pharmacies. The prices here do not account for what the cost may be with help from insurance (it may be cheaper with your insurance), but the website can give you the price if you did not use any insurance.  - You can print the associated coupon and take it with your prescription to the pharmacy.  - You may also stop by our office during regular business hours and pick up a GoodRx coupon card.  - If you need your prescription sent electronically to a different pharmacy, notify our office through [facility] or by phone at [phone] option 4.     Si Usted Necesita Algo Despus de Su Visita  Tambin puede enviarnos un mensaje a travs de Clinical cytogeneticist. Por lo general respondemos a los mensajes de MyChart en el transcurso de 1 a 2 das hbiles.  Para renovar recetas, por favor pida a su farmacia que se ponga en contacto con nuestra oficina. Annie Sable de fax es [place] [phone].  Si tiene un asunto urgente cuando la clnica est cerrada y que no puede esperar hasta el siguiente da hbil, puede llamar/localizar a su doctor(a) al nmero que aparece a continuacin.   Por favor, tenga en cuenta que aunque hacemos todo lo posible para estar disponibles para asuntos urgentes fuera del horario de [place], no estamos disponibles las 24 horas del da, los 7 [address]  [address] de la [place].   Si tiene un problema urgente y no puede comunicarse con nosotros, puede optar por buscar atencin mdica  en el consultorio de su doctor(a), en una clnica privada, en un centro de atencin urgente o en una sala de emergencias.  Si tiene Engineer, drilling, por favor llame  inmediatamente al 911 o vaya a la sala de emergencias.  Nmeros de bper  - Dr. Gwen Pounds: [phone]  - Dra. Moye: [phone]  - Dra. Roseanne Reno: [phone]  En caso de inclemencias del [place], por favor llame a Lacy Duverney principal al [phone] para una actualizacin sobre el [place] de cualquier retraso o cierre.  Consejos para la medicacin en dermatologa: Por favor, guarde las cajas en las que vienen los medicamentos de uso tpico para ayudarle a seguir las instrucciones sobre dnde y cmo usarlos. Las farmacias generalmente imprimen las instrucciones del medicamento slo en las cajas y no directamente en los tubos del [place].   Si su medicamento es muy caro, por favor, pngase en contacto con Rolm Gala llamando al [phone] y presione la opcin 4 o envenos un mensaje a travs de Clinical cytogeneticist.   No podemos decirle cul ser su copago por los medicamentos por adelantado ya que esto es diferente dependiendo de la [place]  seguro. Sin embargo, es posible que podamos encontrar un medicamento sustituto a Audiological scientist un formulario para que el seguro cubra el medicamento que se considera necesario.   Si se requiere una autorizacin previa para que su compaa de seguros [place] su medicamento, por favor permtanos de 1 a 2 das hbiles para completar [address]  Los precios de los medicamentos varan con frecuencia dependiendo del Environmental consultant de dnde se surte la receta y alguna farmacias pueden ofrecer precios ms baratos.  El sitio web www.goodrx.com tiene cupones para medicamentos de Health and safety inspector. Los precios aqu no tienen en cuenta lo que podra costar con la ayuda del seguro (puede ser ms barato con su seguro), pero el sitio web puede darle el precio si no utiliz Tourist information centre manager.  - Puede imprimir el cupn correspondiente y llevarlo con su receta a la farmacia.  - Tambin puede pasar por nuestra oficina durante el horario de atencin regular y Education officer, museum  una tarjeta de cupones de GoodRx.  - Si necesita que su receta se enve electrnicamente a una farmacia diferente, informe a nuestra oficina a travs de MyChart de [place] o por telfono llamando al [phone] y presione la opcin 4.

## 2022-07-20 NOTE — Progress Notes (Signed)
Follow-Up Visit   Subjective  Jordan Mahoney is a 72 y.o. female who presents for the following: Rosacea face, Soolantra qhs, Elidel prn itch, hair loss, several yrs, taking biotin qd x 71m, pt on Iron supplements 65mg  bid, hx of stress from colon ca 2020 and lung ca 2022, no other illness,  check spot R shoulder itchy, did not clear with LN2 from PCP, itchy spot neck, check spots scalp, neck   The following portions of the chart were reviewed this encounter and updated as appropriate: medications, allergies, medical history  Review of Systems:  No other skin or systemic complaints except as noted in HPI or Assessment and Plan.  Objective  Well appearing patient in no apparent distress; mood and affect are within normal limits.  Areas Examined: face  Relevant physical exam findings are noted in the Assessment and Plan.  Right Shoulder x 1, R neck x 1, R scalp x 1 (3) Stuck on waxy paps with erythema             Assessment & Plan   Inflamed seborrheic keratosis (3) Right Shoulder x 1, R neck x 1, R scalp x 1  Symptomatic, irritating, patient would like treated.   Destruction of lesion - Right Shoulder x 1, R neck x 1, R scalp x 1  Destruction method: cryotherapy   Informed consent: discussed and consent obtained   Lesion destroyed using liquid nitrogen: Yes   Region frozen until ice ball extended beyond lesion: Yes   Outcome: patient tolerated procedure well with no complications   Post-procedure details: wound care instructions given   Additional details:  Prior to procedure, discussed risks of blister formation, small wound, skin dyspigmentation, or rare scar following cryotherapy. Recommend Vaseline ointment to treated areas while healing.   Related Medications pimecrolimus (ELIDEL) 1 % cream Apply topically as directed. 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 Currently well-controlled.   Rosacea is a chronic progressive skin condition usually affecting the face of adults, causing redness and/or acne bumps. It is treatable but not curable. It sometimes affects the eyes (ocular rosacea) as well. It may respond to topical and/or systemic medication and can flare with stress, sun exposure, alcohol, exercise, topical steroids (including hydrocortisone/cortisone 10) and some foods.  Daily application of broad spectrum spf 30+ sunscreen to face is recommended to reduce flares.  Treatment Plan Cont Soolantra qhs to face, pt will call when she needs refills Cont Elidel cr qd/bid prn itch  EPIDERMAL INCLUSION CYST vs NEVUS Exam: 3.46mm firm flesh white pap, R post neck  Benign-appearing. Exam most consistent with an epidermal inclusion cyst vs nevus. Discussed that a cyst is a benign growth that can grow over time and sometimes get irritated or inflamed. Recommend observation if it is not bothersome. Discussed option of surgical excision to remove it if it is growing, symptomatic, or other changes noted. Please call for new or changing lesions so they can be evaluated.  ANDROGENETIC ALOPECIA (FEMALE PATTERN HAIR LOSS) Exam: Diffuse thinning of the crown and widening of the midline part with retention of the frontal hairline, photos today  Chronic and persistent condition with duration or expected duration over one year. Condition is bothersome/symptomatic for patient. Currently flared.   With hx of low ferritin, pt currently on Iron 65mg  bid TSH labs from 11/2021 wnl Father  with female pattern baldness BP 129/77  Female Androgenic Alopecia is a chronic condition related to genetics and/or hormonal changes.  In women androgenetic alopecia is commonly associated with menopause but may occur any time after puberty.  It causes hair thinning primarily on the crown with widening of the part and temporal  hairline recession.  Can use OTC Rogaine (minoxidil) 5% solution/foam as directed.  Oral treatments in female patients who have no contraindication may include : - Low dose oral minoxidil 1.25 - 5mg  daily - Spironolactone 50 - 100mg  bid - Finasteride 2.5 - 5 mg daily Adjunctive therapies include: - Low Level Laser Light Therapy (LLLT) - Platelet-rich plasma injections (PRP) - Hair Transplants or scalp reduction   Treatment Plan: Cont Iron supplements 65mg  bid as recommended by PCP  Discussed topical Rogaine and oral Minoxidil  Start Minoxidil 2.5mg  1/2 po qd for 4 wks, if no s/e can increase to 2.5mg  1 po qd Start Finasteride 5mg  1/2 po qd for 4 wks, if no s/e can increase to 5mg  1 po qd  Doses of minoxidil for hair loss are considered 'low dose'. This is because the doses used for hair loss are much lower than the doses which are used for conditions such as high blood pressure (hypertension). The doses used for hypertension are 10-40mg  per day.  Side effects are uncommon at the low doses (up to 2.5 mg/day) used to treat hair loss. Potential side effects, more commonly seen at higher doses, include: Increase in hair growth (hypertrichosis) elsewhere on face and body Temporary hair shedding upon starting medication which may last up to 4 weeks Ankle swelling, fluid retention, rapid weight gain more than 5 pounds Low blood pressure and feeling lightheaded or dizzy when standing up quickly Fast or irregular heartbeat Headaches   Return for 3-25m alopecia f/u.  I, Ardis Rowan, RMA, am acting as scribe for Willeen Niece, MD .   Documentation: I have reviewed the above documentation for accuracy and completeness, and I agree with the above.  Willeen Niece, MD

## 2022-07-23 ENCOUNTER — Ambulatory Visit (INDEPENDENT_AMBULATORY_CARE_PROVIDER_SITE_OTHER): Payer: Medicare Other

## 2022-07-23 DIAGNOSIS — J309 Allergic rhinitis, unspecified: Secondary | ICD-10-CM

## 2022-08-18 ENCOUNTER — Ambulatory Visit (INDEPENDENT_AMBULATORY_CARE_PROVIDER_SITE_OTHER): Payer: Medicare Other

## 2022-08-18 ENCOUNTER — Telehealth: Payer: Self-pay

## 2022-08-18 DIAGNOSIS — J309 Allergic rhinitis, unspecified: Secondary | ICD-10-CM

## 2022-08-18 NOTE — Telephone Encounter (Signed)
I am fine with stopping. I am sure we have talked about this in the past.   She can sign a form.   Malachi Bonds, MD [facility]

## 2022-08-18 NOTE — Telephone Encounter (Signed)
Patient came in for her injection and expressed that she has been on her allergy injections for about nine years now. Patient wants to know when she can stop as she states when she first started she was informed that immunotherapy was to last no more than five years. She stated that she just say a provider not too long ago and no mentions of stopping was discussed. Patient would like to know the next steps to completing immunotherapy. Please advise. Thank you!

## 2022-08-25 ENCOUNTER — Ambulatory Visit (INDEPENDENT_AMBULATORY_CARE_PROVIDER_SITE_OTHER): Payer: Medicare Other

## 2022-08-25 DIAGNOSIS — J309 Allergic rhinitis, unspecified: Secondary | ICD-10-CM

## 2022-10-28 ENCOUNTER — Telehealth: Payer: Self-pay

## 2022-10-28 ENCOUNTER — Other Ambulatory Visit: Payer: Self-pay

## 2022-10-28 NOTE — Telephone Encounter (Signed)
Patient called because she is unable to fill Minoxidil with [facility], per patient - it is on backorder.  Patient states she is unable to fill through any pharmacy besides [facility] per insurance.   Patient questioning should she just start Finasteride at this time?  Also since she has not started at this time, should she just postpone October appointment?

## 2022-11-02 NOTE — Telephone Encounter (Signed)
Patient advised of information per Dr. Roseanne Reno. She is going to wait until Minoxidil comes back into stock through [facility].  Follow up moved to December.

## 2022-12-06 ENCOUNTER — Ambulatory Visit: Payer: Medicare Other | Admitting: Dermatology

## 2022-12-21 NOTE — Progress Notes (Unsigned)
Virtual Visit via Video Note  I connected with Jordan Mahoney on 12/21/22 at  2:00 PM EST by a video enabled telemedicine application and verified that I am speaking with the correct person using two identifiers.  Location: Patient: at her home Provider: in the office    I discussed the limitations of evaluation and management by telemedicine and the availability of in person appointments. The patient expressed understanding and agreed to proceed.  History of Present Illness: Jordan Mahoney is a 72 year old female, seen in request by her primary care nurse practitioner [name] for evaluation of low back pain, numbness tingling in feet    I reviewed and summarized the referring note.  Past medical history Chronic migraine headaches, Neck fusion in 2019, for bilateral upper extremity paresthesia and weakness, she also had mild gait abnormality, surgery did help her symptoms, but she still has residual bilateral hands paresthesia.  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   She is taking methotrexate for recurrent uveitis, is under good control, also reported a history of   She also has a long history of chronic migraine headaches, taking frequent Relpax as needed, which has helped her symptoms.   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  Realized, her Air Adjusted Bed was too firm, lowered the pressure 3 weeks ago symptoms are greatly improving.  No falls, balance is stable.   Migraines: Doing well with Ajovy, continues to have headache with barometric pressure change, but is much less intense.  Has been weaning down Topamax taking 100 mg twice daily every other day, wants to be off.  Reserves Relpax for only severe headaches, if she has to go out.    On gabapentin 400 mg twice a day, for paresthesia to hands post cervical fusion surgery in May 2019 with Dr. Channing Mutters.   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  Here today with her husband.  Labs at last visit showed elevated A1c 6.0, has been eating more candy.  TSH was elevated, endocrinologist has initiated Synthroid.   Update Jun 23, 2020 SS: Here today with her husband, has recently been diagnosed with Hashimoto's, is on Synthroid.  Migraines have been well controlled with Ajovy injection, tolerating well.  No headaches, up until this weekend, with weather change and storms.  Relpax works well, within 20 to 45 minutes.  Is off Topamax.  Remains on gabapentin 400 mg twice daily.  Helps with paresthesia in the hands.  Paresthesia to lower extremities has resolved with sleep number bed adjustment.    Update Jun 23, 2021 SS: Remains on gabapentin 400 mg twice daily, occasionally tingling in both hands, no longer in legs. On Ajovy, still benefiting,triggers with weather change, right now often  headaches because weather change, but intensity is less, doesn't need Relpax as often, usually has 6 tablets a month left. Never has to repeat dose of Relpax. In July 2022, had right upper lung lobe removed for lung cancer. Here with husband. Has vertigo doing vestibular rehab, waiting for referral  for more visits, didn't continue home exercises   Update Jul 01, 2022 SS: Still on Ajovy. This month is a bad month with weather change, season change, she doesn't count them. Uses Relpax, works very well, headaches depend on weather pressure system. Only gets 10 a month, doesn't take all monthly. Takes gabapentin for upper extremity numbness, tingling.  Update December 22, 2022 SS: Emgality working great, no overt migraines. Headaches never turn into migraine, take Relpax twice a month if feels migraine coming on. Remains on gabapentin for tingling in finger tips, stable since c-spine surgery. Wonders about lowering the dose? 1 fall in her laundry room tripped, bruising to left face, otherwise getting around well.    Observations/Objective: Via video visit, alert and oriented, speech is clear and concise, facial symmetry noted, moves about freely.  Does have some bruising to the left side of her face.  Assessment and Plan:  1.  New onset bilateral feet paresthesia 2.  History of cervical decompression surgery 3.  History of sigmoid colon resection in March 2020 4.  History right upper lung lobectomy July 2022 -NCV/EMG in October 2021 was normal of LLE -CEA, CBC, CMP, B12, RPR, folate, CRP, CK, ESR, ANA, MM panel, Vit D, were unremarkable, A1c was elevated 6.0, TSH elevated 5.940 -Would like to try reduction of gabapentin from 400 mg twice daily down to 300 mg twice daily for upper extremity paresthesia, lower extremity resolved.  -I sent in a 67-month supply, if she does well on this dose we may continue, or try 200 mg twice daily.  We can increase back up if needed for paresthesia.   5.  Chronic migraine headaches -Doing excellent with Emgality, will continue 120 mg monthly injection for migraine prevention -Continue Relpax as needed for severe headache -Tried and failed: Ajovy, cannot use Aimovig due to latex allergy -Next steps: Qulipta, Nurtec, is not interested in Botox  Meds ordered  this encounter  Medications   gabapentin (NEURONTIN) 300 MG capsule    Sig: Take 1 capsule (300 mg total) by mouth 2 (two) times daily.    Dispense:  180 capsule    Refill:  0   Galcanezumab-gnlm (EMGALITY) 120 MG/ML SOAJ    Sig: Inject 120 mg into the skin every 30 (thirty) days.    Dispense:  1 mL    Refill:  11   eletriptan (RELPAX) 40 MG tablet    Sig: Take 1 tablet (40 mg total) by mouth every 2 (two) hours as needed for migraine. 40 mg on set May repeat in 2 hours if headache persists or recurs. Not to exceed 2 tabs/24hours.    Dispense:  30 tablet    Refill:  3    Must last 90 days   Follow Up Instructions: 1 year video visit   I discussed the assessment and treatment plan with the patient. The patient was provided an opportunity to ask questions and all were answered. The patient agreed with the plan and demonstrated an understanding of the instructions.   The patient was advised to call back or seek an in-person evaluation if the symptoms worsen or if the condition fails to improve as anticipated.   Margie Ege, AGNP-C, DNP  [facility] [address] [place], [place] [address] [phone]

## 2022-12-22 ENCOUNTER — Telehealth: Payer: Medicare Other | Admitting: Neurology

## 2022-12-22 DIAGNOSIS — G43709 Chronic migraine without aura, not intractable, without status migrainosus: Secondary | ICD-10-CM | POA: Diagnosis not present

## 2022-12-22 DIAGNOSIS — R202 Paresthesia of skin: Secondary | ICD-10-CM

## 2022-12-22 DIAGNOSIS — G43909 Migraine, unspecified, not intractable, without status migrainosus: Secondary | ICD-10-CM

## 2022-12-22 MED ORDER — EMGALITY 120 MG/ML ~~LOC~~ SOAJ: 120.0000 mg | SUBCUTANEOUS | 11 refills | Status: DC

## 2022-12-22 MED ORDER — ELETRIPTAN HYDROBROMIDE 40 MG PO TABS: 40.0000 mg | ORAL_TABLET | ORAL | 3 refills | Status: DC | PRN

## 2022-12-22 MED ORDER — GABAPENTIN 300 MG PO CAPS: 300.0000 mg | ORAL_CAPSULE | Freq: Two times a day (BID) | ORAL | 0 refills | Status: DC

## 2022-12-22 NOTE — Patient Instructions (Signed)
We will reduce gabapentin down to 300 mg twice daily.  If you do not notice any change, we can continue to further reduce the dose.  We will continue Emgality for migraine prevention, use Relpax as needed for migraine.  Please feel free to reach out if you need anything.  I will see you back in 1 year.  Thanks!!

## 2023-01-21 ENCOUNTER — Encounter: Payer: Self-pay | Admitting: Oncology

## 2023-02-01 ENCOUNTER — Ambulatory Visit: Payer: Medicare Other | Admitting: Dermatology

## 2023-02-23 ENCOUNTER — Ambulatory Visit: Payer: Medicare Other | Admitting: Dermatology

## 2023-02-23 DIAGNOSIS — Z79899 Other long term (current) drug therapy: Secondary | ICD-10-CM

## 2023-02-23 DIAGNOSIS — L649 Androgenic alopecia, unspecified: Secondary | ICD-10-CM

## 2023-02-23 MED ORDER — FINASTERIDE 5 MG PO TABS: 5.0000 mg | ORAL_TABLET | Freq: Every day | ORAL | 1 refills | Status: DC

## 2023-02-23 MED ORDER — MINOXIDIL 2.5 MG PO TABS: 2.5000 mg | ORAL_TABLET | Freq: Every day | ORAL | 1 refills | Status: DC

## 2023-02-23 NOTE — Patient Instructions (Signed)
 Start minoxidil  2.5 mg take 1/2 tablet by mouth daily x 1 month. If no side effects, may increase to full tablet daily.  Doses of minoxidil  for hair loss are considered 'low dose'. This is because the doses used for hair loss are much lower than the doses which are used for conditions such as high blood pressure (hypertension). The doses used for hypertension are 10-40mg  per day.  Side effects are uncommon at the low doses (up to 2.5 mg/day) used to treat hair loss. Potential side effects, more commonly seen at higher doses, include: Increase in hair growth (hypertrichosis) elsewhere on face and body Temporary hair shedding upon starting medication which may last up to 4 weeks Ankle swelling, fluid retention, rapid weight gain more than 5 pounds Low blood pressure and feeling lightheaded or dizzy when standing up quickly Fast or irregular heartbeat Headaches  Due to recent changes in healthcare laws, you may see results of your pathology and/or laboratory studies on MyChart before the doctors have had a chance to review them. We understand that in some cases there may be results that are confusing or concerning to you. Please understand that not all results are received at the same time and often the doctors may need to interpret multiple results in order to provide you with the best plan of care or course of treatment. Therefore, we ask that you please give us  2 business days to thoroughly review all your results before contacting the office for clarification. Should we see a critical lab result, you will be contacted sooner.   If You Need Anything After Your Visit  If you have any questions or concerns for your doctor, please call our main line at [phone] and press option 4 to reach your doctor's medical assistant. If no one answers, please leave a voicemail as directed and we will return your call as soon as possible. Messages left after 4 pm will be answered the following business day.   You  may also send us  a message via MyChart. We typically respond to MyChart messages within 1-2 business days.  For prescription refills, please ask your pharmacy to contact our office. Our fax number is [phone].  If you have an urgent issue when the clinic is closed that cannot wait until the next business day, you can page your doctor at the number below.    Please note that while we do our best to be available for urgent issues outside of office hours, we are not available 24/7.   If you have an urgent issue and are unable to reach us , you may choose to seek medical care at your doctor's office, retail clinic, urgent care center, or emergency room.  If you have a medical emergency, please immediately call 911 or go to the emergency department.  Pager Numbers  - Dr. Hester: [phone]  - Dr. Jackquline: [phone]  - Dr. Claudene: [phone]   In the event of inclement weather, please call our main line at [phone] for an update on the status of any delays or closures.  Dermatology Medication Tips: Please keep the boxes that topical medications come in in order to help keep track of the instructions about where and how to use these. Pharmacies typically print the medication instructions only on the boxes and not directly on the medication tubes.   If your medication is too expensive, please contact our office at [phone] option 4 or send us  a message through MyChart.   We are unable to tell  what your co-pay for medications will be in advance as this is different depending on your insurance coverage. However, we may be able to find a substitute medication at lower cost or fill out paperwork to get insurance to cover a needed medication.   If a prior authorization is required to get your medication covered by your insurance company, please allow us  1-2 business days to complete this process.  Drug prices often vary depending on where the prescription is filled and some  pharmacies may offer cheaper prices.  The website www.goodrx.com contains coupons for medications through different pharmacies. The prices here do not account for what the cost may be with help from insurance (it may be cheaper with your insurance), but the website can give you the price if you did not use any insurance.  - You can print the associated coupon and take it with your prescription to the pharmacy.  - You may also stop by our office during regular business hours and pick up a GoodRx coupon card.  - If you need your prescription sent electronically to a different pharmacy, notify our office through [facility] or by phone at [phone] option 4.     Si Usted Necesita Algo Despus de Su Visita  Tambin puede enviarnos un mensaje a travs de Clinical Cytogeneticist. Por lo general respondemos a los mensajes de MyChart en el transcurso de 1 a 2 das hbiles.  Para renovar recetas, por favor pida a su farmacia que se ponga en contacto con nuestra oficina. Randi lakes de fax es [place] [phone].  Si tiene un asunto urgente cuando la clnica est cerrada y que no puede esperar hasta el siguiente da hbil, puede llamar/localizar a su doctor(a) al nmero que aparece a continuacin.   Por favor, tenga en cuenta que aunque hacemos todo lo posible para estar disponibles para asuntos urgentes fuera del horario de [place], no estamos disponibles las 24 horas del da, los 7 [address]  [address] de la [place].   Si tiene un problema urgente y no puede comunicarse con nosotros, puede optar por buscar atencin mdica  en el consultorio de su doctor(a), en una clnica privada, en un centro de atencin urgente o en una sala de emergencias.  Si tiene engineer, drilling, por favor llame inmediatamente al 911 o vaya a la sala de emergencias.  Nmeros de bper  - Dr. Hester: [phone]  - Dra. Jackquline: [phone]  - Dr. Claudene: [phone]   En caso de inclemencias del tiempo, por favor llame a landry capes principal al [phone] para una actualizacin sobre el [place] de cualquier retraso o cierre.  Consejos para la medicacin en dermatologa: Por favor, guarde las cajas en las que vienen los medicamentos de uso tpico para ayudarle a seguir las instrucciones sobre dnde y cmo usarlos. Las farmacias generalmente imprimen las instrucciones del medicamento slo en las cajas y no directamente en los tubos del [place].   Si su medicamento es muy caro, por favor, pngase en contacto con landry rieger llamando al [phone] y presione la opcin 4 o envenos un mensaje a travs de Clinical Cytogeneticist.   No podemos decirle cul ser su copago por los medicamentos por adelantado ya que esto es diferente dependiendo de la cobertura de su seguro. Sin embargo, es posible que podamos encontrar un medicamento sustituto a audiological scientist un formulario para que el seguro cubra el medicamento que se considera necesario.   Si se requiere una autorizacin previa para que su compaa de seguros cubra  su medicamento, por favor permtanos de 1 a 2 das hbiles para completar [address]  Los precios de los medicamentos varan con frecuencia dependiendo del environmental consultant de dnde se surte la receta y alguna farmacias pueden ofrecer precios ms baratos.  El sitio web www.goodrx.com tiene cupones para medicamentos de health and safety inspector. Los precios aqu no tienen en cuenta lo que podra costar con la ayuda del seguro (puede ser ms barato con su seguro), pero el sitio web puede darle el precio si no utiliz tourist information centre manager.  - Puede imprimir el cupn correspondiente y llevarlo con su receta a la farmacia.  - Tambin puede pasar por nuestra oficina durante el horario de atencin regular y education officer, museum una tarjeta de cupones de GoodRx.  - Si necesita que su receta se enve electrnicamente a una farmacia diferente, informe a nuestra oficina a travs de MyChart de [place] o por telfono llamando al [phone] y presione la  opcin 4.

## 2023-02-23 NOTE — Progress Notes (Signed)
   Follow-Up Visit   Subjective  Jordan Mahoney is a 73 y.o. female who presents for the following: Androgenetic alopecia. Patient is taking finasteride  5 MG daily x 3 months with no improvement. She was unable to get minoxidil  filled from [facility], on backorder.      The following portions of the chart were reviewed this encounter and updated as appropriate: medications, allergies, medical history  Review of Systems:  No other skin or systemic complaints except as noted in HPI or Assessment and Plan.  Objective  Well appearing patient in no apparent distress; mood and affect are within normal limits.  A focused examination was performed of the following areas: Face, scalp  Relevant physical exam findings are noted in the Assessment and Plan.    Assessment & Plan  ANDROGENETIC ALOPECIA (FEMALE PATTERN HAIR LOSS) Exam: Diffuse thinning of the crown and widening of the midline part with retention of the frontal hairline, mild improvement compared to photos 07/20/2022 and BL temporal hairline/scalp.  Chronic and persistent condition with duration or expected duration over one year. Condition is symptomatic/ bothersome to patient. Slightly improved, but not currently at goal.   Female Androgenic Alopecia is a chronic condition related to genetics and/or hormonal changes.  In women androgenetic alopecia is commonly associated with menopause but may occur any time after puberty.  It causes hair thinning primarily on the crown with widening of the part and temporal hairline recession.  Can use OTC Rogaine  (minoxidil ) 5% solution/foam as directed.  Oral treatments in female patients who have no contraindication may include : - Low dose oral minoxidil  1.25 - 5mg  daily - Spironolactone 50 - 100mg  bid - Finasteride  2.5 - 5 mg daily Adjunctive therapies include: - Low Level Laser Light Therapy (LLLT) - Platelet-rich plasma injections (PRP) - Hair Transplants or scalp reduction    Treatment Plan: Cont Iron  supplements 65mg  bid as recommended by PCP  Start Minoxidil  2.5mg  1/2 po qd for 4 wks, if no s/e can increase to 2.5mg  1 po every day dsp #90 1Rf. Continue Finasteride  5mg  1 po every day dsp #90 1Rf.   Ferritin reviewed from 04/30/2022 - 36  Doses of minoxidil  for hair loss are considered 'low dose'. This is because the doses used for hair loss are much lower than the doses which are used for conditions such as high blood pressure (hypertension). The doses used for hypertension are 10-40mg  per day.  Side effects are uncommon at the low doses (up to 2.5 mg/day) used to treat hair loss. Potential side effects, more commonly seen at higher doses, include: Increase in hair growth (hypertrichosis) elsewhere on face and body Temporary hair shedding upon starting medication which may last up to 4 weeks Ankle swelling, fluid retention, rapid weight gain more than 5 pounds Low blood pressure and feeling lightheaded or dizzy when standing up quickly Fast or irregular heartbeat Headaches    Long term medication management.  Patient is using long term (months to years) prescription medication  to control their dermatologic condition.  These medications require periodic monitoring to evaluate for efficacy and side effects and may require periodic laboratory monitoring.      Return in about 6 months (around 08/23/2023) for Alopecia.  IAndrea Kerns, CMA, am acting as scribe for Rexene Rattler, MD .   Documentation: I have reviewed the above documentation for accuracy and completeness, and I agree with the above.  Rexene Rattler, MD

## 2023-03-03 ENCOUNTER — Telehealth: Payer: Self-pay | Admitting: Oncology

## 2023-03-03 NOTE — Telephone Encounter (Signed)
Pt was scheduled for MD appt on 3/25 for CT results. MD will not be here this day. I spoke with pt and have r/s the appt to the next day 3/26.

## 2023-03-14 ENCOUNTER — Ambulatory Visit: Payer: Medicare Other

## 2023-03-14 DIAGNOSIS — Z08 Encounter for follow-up examination after completed treatment for malignant neoplasm: Secondary | ICD-10-CM | POA: Diagnosis not present

## 2023-03-14 DIAGNOSIS — Z980 Intestinal bypass and anastomosis status: Secondary | ICD-10-CM | POA: Diagnosis not present

## 2023-03-14 DIAGNOSIS — Z85038 Personal history of other malignant neoplasm of large intestine: Secondary | ICD-10-CM | POA: Diagnosis not present

## 2023-05-03 ENCOUNTER — Other Ambulatory Visit: Admission: RE | Admit: 2023-05-03 | Source: Ambulatory Visit

## 2023-05-03 ENCOUNTER — Ambulatory Visit
Admission: RE | Admit: 2023-05-03 | Discharge: 2023-05-03 | Disposition: A | Discharge status: Home / Self Care | Payer: Medicare Other | Source: Ambulatory Visit | Attending: Oncology | Admitting: Oncology

## 2023-05-03 ENCOUNTER — Inpatient Hospital Stay: Payer: Medicare Other | Attending: Oncology

## 2023-05-03 DIAGNOSIS — C3491 Malignant neoplasm of unspecified part of right bronchus or lung: Secondary | ICD-10-CM | POA: Insufficient documentation

## 2023-05-03 DIAGNOSIS — K760 Fatty (change of) liver, not elsewhere classified: Secondary | ICD-10-CM | POA: Diagnosis not present

## 2023-05-03 DIAGNOSIS — D649 Anemia, unspecified: Secondary | ICD-10-CM | POA: Diagnosis not present

## 2023-05-03 DIAGNOSIS — Z85118 Personal history of other malignant neoplasm of bronchus and lung: Secondary | ICD-10-CM | POA: Insufficient documentation

## 2023-05-03 DIAGNOSIS — Z85038 Personal history of other malignant neoplasm of large intestine: Secondary | ICD-10-CM | POA: Insufficient documentation

## 2023-05-03 DIAGNOSIS — C187 Malignant neoplasm of sigmoid colon: Secondary | ICD-10-CM

## 2023-05-03 DIAGNOSIS — Z08 Encounter for follow-up examination after completed treatment for malignant neoplasm: Secondary | ICD-10-CM | POA: Insufficient documentation

## 2023-05-03 DIAGNOSIS — R7401 Elevation of levels of liver transaminase levels: Secondary | ICD-10-CM | POA: Insufficient documentation

## 2023-05-03 LAB — CBC WITH DIFFERENTIAL (CANCER CENTER ONLY)
Abs Immature Granulocytes: 0.03 10*3/uL (ref 0.00–0.07)
Basophils Absolute: 0.1 10*3/uL (ref 0.0–0.1)
Basophils Relative: 1 %
Eosinophils Absolute: 0.2 10*3/uL (ref 0.0–0.5)
Eosinophils Relative: 3 %
HCT: 43.4 % (ref 36.0–46.0)
Hemoglobin: 14.1 g/dL (ref 12.0–15.0)
Immature Granulocytes: 0 %
Lymphocytes Relative: 28 %
Lymphs Abs: 2.2 10*3/uL (ref 0.7–4.0)
MCH: 30.9 pg (ref 26.0–34.0)
MCHC: 32.5 g/dL (ref 30.0–36.0)
MCV: 95.2 fL (ref 80.0–100.0)
Monocytes Absolute: 0.8 10*3/uL (ref 0.1–1.0)
Monocytes Relative: 10 %
Neutro Abs: 4.6 10*3/uL (ref 1.7–7.7)
Neutrophils Relative %: 58 %
Platelet Count: 238 10*3/uL (ref 150–400)
RBC: 4.56 MIL/uL (ref 3.87–5.11)
RDW: 12.2 % (ref 11.5–15.5)
WBC Count: 7.9 10*3/uL (ref 4.0–10.5)
nRBC: 0 % (ref 0.0–0.2)

## 2023-05-03 LAB — CMP (CANCER CENTER ONLY)
ALT: 72 U/L — ABNORMAL HIGH (ref 0–44)
AST: 55 U/L — ABNORMAL HIGH (ref 15–41)
Albumin: 4 g/dL (ref 3.5–5.0)
Alkaline Phosphatase: 99 U/L (ref 38–126)
Anion gap: 11 (ref 5–15)
BUN: 14 mg/dL (ref 8–23)
CO2: 24 mmol/L (ref 22–32)
Calcium: 8.7 mg/dL — ABNORMAL LOW (ref 8.9–10.3)
Chloride: 101 mmol/L (ref 98–111)
Creatinine: 0.88 mg/dL (ref 0.44–1.00)
GFR, Estimated: >60 mL/min (ref >60)
Glucose, Bld: 178 mg/dL — ABNORMAL HIGH (ref 70–99)
Potassium: 4 mmol/L (ref 3.5–5.1)
Sodium: 136 mmol/L (ref 135–145)
Total Bilirubin: 1.1 mg/dL (ref 0.0–1.2)
Total Protein: 6.9 g/dL (ref 6.5–8.1)

## 2023-05-03 LAB — IRON AND TIBC
Iron: 91 ug/dL (ref 28–170)
Saturation Ratios: 24 % (ref 10.4–31.8)
TIBC: 378 ug/dL (ref 250–450)
UIBC: 287 ug/dL

## 2023-05-03 LAB — FERRITIN: Ferritin: 501 ng/mL — ABNORMAL HIGH (ref 11–307)

## 2023-05-03 MED ORDER — IOHEXOL 300 MG/ML  SOLN
100.0000 mL | Freq: Once | INTRAMUSCULAR | Status: AC | PRN
  Administered 2023-05-03: 100 mL via INTRAVENOUS

## 2023-05-04 LAB — CEA: CEA: 2.9 ng/mL (ref 0.0–4.7)

## 2023-05-10 ENCOUNTER — Ambulatory Visit: Payer: Medicare Other | Admitting: Oncology

## 2023-05-11 ENCOUNTER — Inpatient Hospital Stay (HOSPITAL_BASED_OUTPATIENT_CLINIC_OR_DEPARTMENT_OTHER): Payer: Medicare Other | Admitting: Oncology

## 2023-05-11 ENCOUNTER — Encounter: Payer: Self-pay | Admitting: Oncology

## 2023-05-11 VITALS — BP 135/89 | HR 88 | Temp 98.8°F | Resp 18 | Wt 213.6 lb

## 2023-05-11 DIAGNOSIS — C3491 Malignant neoplasm of unspecified part of right bronchus or lung: Secondary | ICD-10-CM

## 2023-05-11 DIAGNOSIS — K760 Fatty (change of) liver, not elsewhere classified: Secondary | ICD-10-CM | POA: Diagnosis not present

## 2023-05-11 DIAGNOSIS — Z08 Encounter for follow-up examination after completed treatment for malignant neoplasm: Secondary | ICD-10-CM | POA: Diagnosis not present

## 2023-05-11 DIAGNOSIS — C187 Malignant neoplasm of sigmoid colon: Secondary | ICD-10-CM | POA: Diagnosis not present

## 2023-05-11 NOTE — Assessment & Plan Note (Addendum)
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 CEA stable. Continue annual CT abdomen pelvis surveillance. -Recent CT showed no evidence of cancer recurrence.   July 2025 she will be 5 years after surgery, will stop CT surveillance unless clinically indicated.

## 2023-05-11 NOTE — Progress Notes (Signed)
 Hematology/Oncology Progress note [phone] [phone]     CHIEF COMPLAINTS/REASON FOR VISIT:  Follow up Stage IIA colorectal cancer, stage I right upper lung cancer  ASSESSMENT & PLAN:   Adenocarcinoma of sigmoid colon (HCC) # Stage IIA sigmoid colon -2020 Clinically she is doing very well. CEA stable. Continue annual CT abdomen pelvis surveillance. -Recent CT showed no evidence of cancer recurrence.   July 2025 she will be 5 years after surgery, will stop CT surveillance unless clinically indicated.   Primary lung adenocarcinoma, right (HCC) Stage I right lung adenocarcinoma, status post lobectomy. -09/01/20 CT chest contrast showed no cancer recurrence. Labs reviewed and discussed with patient. Repeat CT chest w contrast in a year.  Fatty liver Transaminitis is worse Recommend healthy diet. She is not able to do exercise due to vertigo.  I recommend patient to discuss with GI for advice.  Orders Placed This Encounter  Procedures   CT Chest W Contrast    Standing Status:   Future    Expected Date:   05/10/2024    Expiration Date:   05/10/2024    If indicated for the ordered procedure, I authorize the administration of contrast media per Radiology protocol:   Yes    Does the patient have a contrast media/X-ray dye allergy?:   No    Preferred imaging location?:    [facility]   CMP (Cancer Center only)    Standing Status:   Future    Expected Date:   05/10/2024    Expiration Date:   05/10/2024   CBC with Differential (Cancer Center Only)    Standing Status:   Future    Expected Date:   05/10/2024    Expiration Date:   05/10/2024   CEA    Standing Status:   Future    Expected Date:   05/10/2024    Expiration Date:   05/10/2024   Follow up 1 year,  All questions were answered. The patient knows to call the clinic with any problems, questions or concerns.  Rickard Patience, MD, PhD [facility] Hematology Oncology 05/11/2023   HISTORY OF PRESENTING  ILLNESS:  Jordan Mahoney is a  73 y.o.  female with PMH listed below who was referred to me for evaluation of stage IIA colorectal cancer. Patient was initially seen care physician for evaluation of fatigue.  Lab work-up showed that patient's anemic.  Hemoccult was obtained and was positive.  Patient was referred to [facility] GI clinic and was seen and evaluated by Dr. Norma Fredrickson.  Reports that she had 2-3 bowel movement daily sometimes mushy and sometimes formed.  Denies seeing any bright red blood in the stool.  Denies any associated abdominal pain, gastric discomfort.  Appetite seems to be stable.  History of gastric bypass in 2010.  Cholecystectomy in 2000.  ?Unintentional weight loss 5 to 6 pounds for the past 2 months.   prior records showed she weighs 149 pounds on 09/28/2017, weighed 160 pounds 05/03/2017 Colonoscopy was obtained on 03/27/2018.  6 mm polyp found in sigmoid colon.  Polyp is sessile.An ulcerated partially obstructing mass was found in the rectosigmoid colon.  Mass was circumferential.  Measured 5 cm in length.  No bleeding was present.  Biopsy was taken.  Nonbleeding internal hemorrhoids were found during retroflexion.  #04/17/2018 underwent surgery.  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 No loss of nuclear expression of MMR proteins: Low probability of MSI-H.  Stage  IIA, No adjuvant chemotherapy was not  Offered  # Stage ! Lung cancer.  Slow-growing lung nodule in the right upper lobe.  Case on 05/01/2020 tumor board.Consensus reached upon referring patient to pulmonology Dr. Jayme Cloud for biopsy via bronchoscopy. 06/27/2020, PET scan shows no distant metastasis.  Right upper lobe pulm nodule SUV 1.3.  Asymmetric palatine tonsilla activity probably physiologic.  Lack of CT correlate. 08/05/2020 right upper lobe fine-needle aspiration the bronchoscopy showed malignant cells consistent with  non-small cell carcinoma.  09/01/2020, patient underwent right upper lobe lobectomy with lymph node dissection.  No LVI, pT1b pN0   INTERVAL HISTORY Jordan Mahoney is a 73 y.o. female who has above history reviewed by me today presents for follow up visit for management of Stage IIA colorectal cancer and Stage ! Lung cancer.   Patient reports feeling well. She has good appetite. She denies unintentional weight loss, hemoptysis, shortness of breath, blood in the stool, abdominal pain.   No new compliants  Review of Systems  Constitutional:  Negative for appetite change, chills, fatigue and fever.  HENT:   Negative for hearing loss and voice change.   Eyes:  Negative for eye problems.  Respiratory:  Negative for chest tightness and cough.   Cardiovascular:  Negative for chest pain.  Gastrointestinal:  Negative for abdominal distention, abdominal pain and blood in stool.  Endocrine: Negative for hot flashes.  Genitourinary:  Negative for difficulty urinating and frequency.   Musculoskeletal:  Negative for arthralgias.  Skin:  Negative for itching and rash.  Neurological:  Negative for extremity weakness.       Vertigo  Hematological:  Negative for adenopathy.  Psychiatric/Behavioral:  Negative for confusion.        MEDICAL HISTORY:  Past Medical History:  Diagnosis Date   Allergic rhinoconjunctivitis    Allergies    Arthritis    Back pain    Chronic uveitis    Depression    Frequent UTI    GERD (gastroesophageal reflux disease)    HA (headache)    Hearing loss    wearing bilateral aides   History of hiatal hernia    Hypothyroidism    Lactose intolerance    Migraine    Osteopenia    Pre-diabetes    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HCC)    Sensorineural hearing loss    Tingling of both feet    Tinnitus     SURGICAL HISTORY: Past Surgical History:  Procedure Laterality Date   ABDOMINAL HYSTERECTOMY     belly button     belly button hernia   BRONCHIAL BIOPSY  08/05/2020    Procedure: BRONCHIAL BIOPSIES;  Surgeon: Josephine Igo, DO;  Location: [facility];  Service: Pulmonary;;   BRONCHIAL BRUSHINGS  08/05/2020   Procedure: BRONCHIAL BRUSHINGS;  Surgeon: Josephine Igo, DO;  Location: [facility];  Service: Pulmonary;;   BRONCHIAL NEEDLE ASPIRATION BIOPSY  08/05/2020   Procedure: BRONCHIAL NEEDLE ASPIRATION BIOPSIES;  Surgeon: Josephine Igo, DO;  Location: [facility];  Service: Pulmonary;;   BRONCHIAL WASHINGS  08/05/2020   Procedure: BRONCHIAL WASHINGS;  Surgeon: Josephine Igo, DO;  Location: [facility];  Service: Pulmonary;;   c sections     CATARACT EXTRACTION Right    CHOLECYSTECTOMY     COLON RESECTION N/A 04/17/2018   Procedure: LAPAROSCOPIC COLON RESECTION POSSIBLE OSTOMY;  Surgeon: Sung Amabile, DO;  Location: [facility];  Service: General;  Laterality: N/A;   COLONOSCOPY  10/13/2021   COLONOSCOPY WITH PROPOFOL N/A 07/23/2020  Procedure: COLONOSCOPY WITH PROPOFOL;  Surgeon: Toledo, Boykin Nearing, MD;  Location: [facility];  Service: Gastroenterology;  Laterality: N/A;   DIAGNOSTIC LAPAROSCOPY     ELBOW SURGERY Left    EYE SURGERY Right    glaucoma and stent surgery   FIDUCIAL MARKER PLACEMENT  08/05/2020   Procedure: FIDUCIAL MARKER PLACEMENT;  Surgeon: Josephine Igo, DO;  Location: [facility];  Service: Pulmonary;;   GANGLION CYST EXCISION Left 01/22/2016   base of thumb   GASTRIC BYPASS     GLAUCOMA SURGERY     GLAUCOMA SURGERY  12/13/2019   right eye   HERNIA REPAIR     INTERCOSTAL NERVE BLOCK Right 09/01/2020   Procedure: INTERCOSTAL NERVE BLOCK;  Surgeon: Corliss Skains, MD;  Location: [facility];  Service: Thoracic;  Laterality: Right;   LUNG REMOVAL, PARTIAL     R upper lobe   neck fusion  07/04/2017   Dr. Channing Mutters in [place]   NODE DISSECTION Right 09/01/2020   Procedure: NODE DISSECTION;  Surgeon: Corliss Skains, MD;  Location: [facility];  Service: Thoracic;  Laterality: Right;   SPINE SURGERY     VIDEO BRONCHOSCOPY  WITH ENDOBRONCHIAL NAVIGATION Right 08/05/2020   Procedure: VIDEO BRONCHOSCOPY WITH ENDOBRONCHIAL NAVIGATION;  Surgeon: Josephine Igo, DO;  Location: [facility];  Service: Pulmonary;  Laterality: Right;   WRIST SURGERY Right     SOCIAL HISTORY: Social History   Socioeconomic History   Marital status: Married    Spouse name: Not on file   Number of children: 2   Years of education: college   Highest education level: Not on file  Occupational History    Comment: retired  Tobacco Use   Smoking status: Former    Current packs/day: 0.00    Average packs/day: 0.5 packs/day for 10.0 years (5.0 ttl pk-yrs)    Types: Cigarettes    Start date: 19    Quit date: 1982    Years since quitting: 43.2   Smokeless tobacco: Never  Vaping Use   Vaping status: Never Used  Substance and Sexual Activity   Alcohol use: Not Currently    Alcohol/week: 1.0 standard drink of alcohol    Types: 1 Cans of beer per week   Drug use: No   Sexual activity: Not on file  Other Topics Concern   Not on file  Social History Narrative   Patient lives at home with her husband Leonard Schwartz).    Education two years of college.   Caffeine - two glasses of tea.   Social Drivers of Corporate investment banker Strain: Not on file  Food Insecurity: Not on file  Transportation Needs: Not on file  Physical Activity: Not on file  Stress: Not on file  Social Connections: Not on file  Intimate Partner Violence: Not on file    FAMILY HISTORY: Family History  Problem Relation Age of Onset   Diabetes Father    Stroke Father    Squamous cell carcinoma Mother    Migraines Daughter    Breast cancer Paternal Aunt    Allergic rhinitis Neg Hx    Angioedema Neg Hx    Asthma Neg Hx    Eczema Neg Hx    Immunodeficiency Neg Hx    Urticaria Neg Hx     ALLERGIES:  is allergic to diazepam, latex, molds & smuts, other, sulfa antibiotics, and tape.  MEDICATIONS:  Current Outpatient Medications  Medication Sig  Dispense Refill   Ascorbic Acid (VITAMIN C PO)  Take 1 mg by mouth 2 (two) times daily.     azelastine (ASTELIN) 0.1 % nasal spray Place 2 sprays into both nostrils daily. 90 mL 3   CALCIUM CITRATE PO Take 1,000 mg by mouth at bedtime. Bariatric Advantage     Cholecalciferol (VITAMIN D3) 125 MCG (5000 UT) CAPS Take 5,000 Units by mouth daily.     Cyanocobalamin (VITAMIN B12) 1000 MCG TBCR Take 1,000 mcg by mouth in the morning.     cyproheptadine (PERIACTIN) 4 MG tablet Take 1 tablet (4 mg total) by mouth at bedtime as needed for allergies. 90 tablet 3   dorzolamide-timolol (COSOPT) 22.3-6.8 MG/ML ophthalmic solution Place 1 drop into the right eye 2 (two) times daily.     eletriptan (RELPAX) 40 MG tablet Take 1 tablet (40 mg total) by mouth every 2 (two) hours as needed for migraine. 40 mg on set May repeat in 2 hours if headache persists or recurs. Not to exceed 2 tabs/24hours. 30 tablet 3   ferrous sulfate 324 MG TBEC Take 324 mg by mouth.     finasteride (PROSCAR) 5 MG tablet Take 1 tablet (5 mg total) by mouth daily. 90 tablet 1   folic acid (FOLVITE) 1 MG tablet Take 1 mg by mouth in the morning.     gabapentin (NEURONTIN) 300 MG capsule Take 1 capsule (300 mg total) by mouth 2 (two) times daily. 180 capsule 0   Galcanezumab-gnlm (EMGALITY) 120 MG/ML SOAJ Inject 120 mg into the skin every 30 (thirty) days. 1 mL 11   Iron Combinations (CHROMAGEN) capsule Take 1 capsule by mouth daily.     Ivermectin (SOOLANTRA) 1 % CREA Apply 1 Application topically at bedtime. Qhs to face for rosacea 135 g 4   Krill Oil 350 MG CAPS Take 350 mg by mouth daily.     levothyroxine (SYNTHROID) 75 MCG tablet Take 1 tablet (75 mcg total) by mouth daily before breakfast. 90 tablet 1   Magnesium Oxide 400 MG CAPS Take 400 mg by mouth in the morning and at bedtime.     meclizine (ANTIVERT) 25 MG tablet Take by mouth.     minoxidil (LONITEN) 2.5 MG tablet Take 1 tablet (2.5 mg total) by mouth daily. 90 tablet 1    Multiple Vitamin (MULTIVITAMIN) tablet Take 1 tablet by mouth 2 (two) times daily. Bariatric Advantage     omeprazole (PRILOSEC) 20 MG capsule Take 1 capsule (20 mg total) by mouth daily. 90 capsule 3   pimecrolimus (ELIDEL) 1 % cream Apply topically as directed. Qd to bid aa right shoulder until resolved 90 g 1   prednisoLONE acetate (PRED FORTE) 1 % ophthalmic suspension Place 1 drop into the right eye daily.     Probiotic Product (PROBIOTIC DAILY PO) Take by mouth.     riboflavin (VITAMIN B-2) 100 MG TABS tablet Take 100 mg by mouth in the morning and at bedtime.     solifenacin (VESICARE) 10 MG tablet Take 10 mg by mouth in the morning.     tiZANidine (ZANAFLEX) 4 MG capsule Take 4 mg by mouth 3 (three) times daily as needed for muscle spasms.     No current facility-administered medications for this visit.     PHYSICAL EXAMINATION: ECOG PERFORMANCE STATUS: 1 - Symptomatic but completely ambulatory Vitals:   05/11/23 1322  BP: 135/89  Pulse: 88  Resp: 18  Temp: 98.8 F (37.1 C)   Filed Weights   05/11/23 1322  Weight: 213 lb 9.6 oz (96.9  kg)    Physical Exam Constitutional:      General: She is not in acute distress.    Appearance: She is not diaphoretic.  HENT:     Head: Normocephalic.  Eyes:     General: No scleral icterus. Cardiovascular:     Rate and Rhythm: Normal rate.     Heart sounds: No murmur heard. Pulmonary:     Effort: Pulmonary effort is normal. No respiratory distress.     Breath sounds: No wheezing.  Abdominal:     General: There is no distension.  Musculoskeletal:        General: Normal range of motion.     Cervical back: Normal range of motion.  Skin:    General: Skin is warm and dry.  Neurological:     Mental Status: She is alert and oriented to person, place, and time. Mental status is at baseline.     Motor: No abnormal muscle tone.  Psychiatric:        Mood and Affect: Mood and affect normal.      RADIOGRAPHIC STUDIES: I have  personally reviewed the radiological images as listed and agreed with the findings in the report. CT CHEST ABDOMEN PELVIS W CONTRAST Result Date: 05/03/2023 CLINICAL DATA:  History of colorectal cancer, follow-up. History of stage I right upper lobe lung cancer. * Tracking Code: BO * EXAM: CT CHEST, ABDOMEN, AND PELVIS WITH CONTRAST TECHNIQUE: Multidetector CT imaging of the chest, abdomen and pelvis was performed following the standard protocol during bolus administration of intravenous contrast. RADIATION DOSE REDUCTION: This exam was performed according to the departmental dose-optimization program which includes automated exposure control, adjustment of the mA and/or kV according to patient size and/or use of iterative reconstruction technique. CONTRAST:  OMNIPAQUE IOHEXOL 300 MG/ML  SOLN COMPARISON:  Multiple priors including CT April 30, 2022 FINDINGS: CT CHEST FINDINGS Cardiovascular: Normal caliber thoracic aorta. Normal size heart. No significant pericardial effusion/thickening. Mediastinum/Nodes: No suspicious thyroid nodule. No pathologically enlarged mediastinal, hilar or axillary lymph nodes. Reflux/retained contrast in a patulous esophagus. Lungs/Pleura: Similar changes of right upper lobectomy for instance on image 33/4. No suspicious pulmonary nodules or masses. Musculoskeletal: Anterior cervical fusion hardware. No aggressive lytic or blastic lesion of bone. T12 vertebral body hemangioma. CT ABDOMEN PELVIS FINDINGS Hepatobiliary: Diffuse hepatic steatosis. No suspicious hepatic lesion. Gallbladder surgically absent. No biliary ductal dilation. Pancreas: Stable appearance of the pancreas with mild atrophy and fatty replacement throughout. No pancreatic ductal dilation. Spleen: No splenomegaly. Adrenals/Urinary Tract: Bilateral adrenal glands appear normal. No hydronephrosis. Kidneys demonstrate symmetric enhancement. Similar left upper pole calcification with overlying cortical scarring.  Urinary bladder is unremarkable for degree of distension. Stomach/Bowel: Prior Roux-en-Y gastric bypass. Prior partial sigmoidectomy with reanastomosis, no new suspicious nodularity along the suture line. No pathologic dilation of small or large bowel. Vascular/Lymphatic: Aortic atherosclerosis. Normal caliber abdominal aorta. Smooth IVC contours. The portal, splenic and superior mesenteric veins are patent. No pathologically enlarged abdominal or pelvic lymph nodes. Reproductive: Status post hysterectomy. No adnexal masses. Other: No significant abdominopelvic free fluid. Musculoskeletal: No aggressive lytic or blastic lesion of bone. IMPRESSION: 1. Stable examination without evidence of recurrent or metastatic disease in the chest, abdomen or pelvis. 2. Prior right upper lobectomy and partial sigmoidectomy. 3. Patulous esophagus with reflux versus retained enteric contrast in the esophagus suggestive of dysmotility/gastroesophageal reflux. 4. Diffuse hepatic steatosis. 5. Aortic atherosclerosis. Electronically Signed   By: Maudry Mayhew M.D.   On: 05/03/2023 15:21  LABORATORY DATA:  I have reviewed the data as listed    Latest Ref Rng & Units 05/03/2023    8:01 AM 04/30/2022   11:32 AM 10/27/2021   10:47 AM  CBC  WBC 4.0 - 10.5 K/uL 7.9  7.8  8.3   Hemoglobin 12.0 - 15.0 g/dL 16.1  09.6  04.5   Hematocrit 36.0 - 46.0 % 43.4  42.5  36.0   Platelets 150 - 400 K/uL 238  250  296       Latest Ref Rng & Units 05/03/2023    8:02 AM 04/30/2022   11:32 AM 10/27/2021   10:47 AM  CMP  Glucose 70 - 99 mg/dL 409  811  914   BUN 8 - 23 mg/dL 14  15  14    Creatinine 0.44 - 1.00 mg/dL 7.82  9.56  2.13   Sodium 135 - 145 mmol/L 136  137  134   Potassium 3.5 - 5.1 mmol/L 4.0  3.8  4.2   Chloride 98 - 111 mmol/L 101  105  104   CO2 22 - 32 mmol/L 24  24  24    Calcium 8.9 - 10.3 mg/dL 8.7  8.5  8.6   Total Protein 6.5 - 8.1 g/dL 6.9  7.2  7.1   Total Bilirubin 0.0 - 1.2 mg/dL 1.1  1.0  0.7   Alkaline  Phos 38 - 126 U/L 99  105  110   AST 15 - 41 U/L 55  42  49   ALT 0 - 44 U/L 72  44  40    Preop CEA was obtained on 04/03/2018, at 1.8

## 2023-05-11 NOTE — Progress Notes (Signed)
 Pt here for follow up. No new concerns voiced.

## 2023-05-11 NOTE — Assessment & Plan Note (Signed)
 Transaminitis is worse Recommend healthy diet. She is not able to do exercise due to vertigo.  I recommend patient to discuss with GI for advice.

## 2023-05-11 NOTE — Assessment & Plan Note (Addendum)
 Stage I right lung adenocarcinoma, status post lobectomy. -09/01/20 CT chest contrast showed no cancer recurrence. Labs reviewed and discussed with patient. Repeat CT chest w contrast in a year.

## 2023-05-13 ENCOUNTER — Ambulatory Visit: Payer: Medicare Other | Admitting: Allergy & Immunology

## 2023-05-13 ENCOUNTER — Encounter: Payer: Self-pay | Admitting: Allergy & Immunology

## 2023-05-13 VITALS — BP 124/82 | HR 102 | Temp 97.9°F | Ht 66.0 in | Wt 211.1 lb

## 2023-05-13 DIAGNOSIS — K219 Gastro-esophageal reflux disease without esophagitis: Secondary | ICD-10-CM

## 2023-05-13 DIAGNOSIS — C3491 Malignant neoplasm of unspecified part of right bronchus or lung: Secondary | ICD-10-CM | POA: Diagnosis not present

## 2023-05-13 DIAGNOSIS — J302 Other seasonal allergic rhinitis: Secondary | ICD-10-CM

## 2023-05-13 DIAGNOSIS — J3089 Other allergic rhinitis: Secondary | ICD-10-CM

## 2023-05-13 DIAGNOSIS — C187 Malignant neoplasm of sigmoid colon: Secondary | ICD-10-CM

## 2023-05-13 DIAGNOSIS — K760 Fatty (change of) liver, not elsewhere classified: Secondary | ICD-10-CM

## 2023-05-13 MED ORDER — CYPROHEPTADINE HCL 4 MG PO TABS: 4.0000 mg | ORAL_TABLET | Freq: Every evening | ORAL | 3 refills | Status: AC | PRN

## 2023-05-13 MED ORDER — AZELASTINE HCL 0.1 % NA SOLN: 2.0000 | Freq: Every day | NASAL | 3 refills | Status: AC

## 2023-05-13 NOTE — Progress Notes (Signed)
 FOLLOW UP  Date of Service/Encounter:  05/13/23   Assessment:   Seasonal and perennial allergic rhinitis (molds, dust mites, cat and dog) - completed 5+ years of allergen immunotherapy   Gastroesophageal reflux disease - doing well on daily PPI   Recent diagnosis of colon cancer - s/p partial bowel resection (recent clear screening)    Recent diagnosis of lung cancer - s/p resection (recent clear screening)    Hashimoto's thyroiditis (on levothyroxine) - previously seen by Dr. Fransico Him   Vertigo   Plan/Recommendations:   1. Perennial and seasonal allergic rhinitis (molds, cat, dog, dust mite) - Continue Periactin 4mg  at night. - Continue azelastine 2 sprays each nostril once a day as needed  2. GERD - Continue with Prilosec once daily. - 90 day supply with refills sent in.   3. Return in about 1 year (around 05/12/2024). You can have the follow up appointment with Dr. Dellis Anes or a Nurse Practicioner (our Nurse Practitioners are excellent and always have Physician oversight!).    Subjective:   Jordan Mahoney is a 73 y.o. female presenting today for follow up of  Chief Complaint  Patient presents with   Seasonal and perennial allergic rhinitis    Need refills    Jordan Mahoney has a history of the following: Patient Active Problem List   Diagnosis Date Noted   Fatty liver 05/11/2023   Primary lung adenocarcinoma, right (HCC) 05/04/2022   Mixed hyperlipidemia 11/20/2021   Type 2 diabetes mellitus without complication, without long-term current use of insulin (HCC) 05/21/2021   S/P lobectomy of lung 09/01/2020   Solitary pulmonary nodule on lung CT 05/22/2020   Hypothyroidism 02/18/2020   Prediabetes 02/18/2020   History of fusion of cervical spine 12/24/2019   Chronic migraine 11/15/2019   Paresthesia 11/15/2019   Gait abnormality 11/15/2019   Adenocarcinoma of sigmoid colon (HCC) 04/17/2018   Iron deficiency anemia due to chronic blood loss 04/04/2018    Gastroesophageal reflux disease 05/03/2017   Lactose intolerance 05/03/2017   Allergic rhinitis 11/07/2014   Headache, migraine 03/27/2014   Migraine 12/11/2013   Numbness 12/11/2013   Urinary incontinence 12/11/2013   HA (headache)     History obtained from: chart review and patient.  Discussed the use of AI scribe software for clinical note transcription with the patient and/or guardian, who gave verbal consent to proceed.  Jordan Mahoney is a 73 y.o. female presenting for a follow up visit.  She was last seen in February 2023.  At that time, we continue with allergy shots at the same schedule.  We continue with Periactin 4 mg at night to help with postnasal drip.  She also remained on Astelin 2 sprays per nostril once a day.  In the interim, she did decide to stop allergen immunotherapy.  Her last injection was in July 2024.  Since the last visit, she has done well.   Allergic Rhinitis Symptom History: She is no longer receiving allergy shots and has not experienced any problems since discontinuing them. She uses a nasal spray only when going out and continues to take Periactin (cyproheptadine) at night, which aids her sleep. No side effects from these medications are reported.  Skin Symptom History: She uses Elidel (pimecrolimus) cream for rosacea on her face, applying it at night due to its sulfur smell. She sees a dermatologist for this condition. This is refilled by Dr. Roseanne Reno.  GERD Symptom History: She is currently taking omeprazole for acid reflux, which is well-controlled. She is under  the care of a gastroenterologist for this condition.  She has a history of colon cancer and lung cancer, both detected early. The colon cancer was identified during a colonoscopy, and the lung cancer was managed surgically with the removal of the top right section of her lung. She did not require radiation and reports no impact on her breathing. She has never smoked.  She has been diagnosed with fatty  liver disease, identified through blood work. She attributes this to her inability to exercise and other unspecified factors.  Otherwise, there have been no changes to her past medical history, surgical history, family history, or social history.    Review of systems otherwise negative other than that mentioned in the HPI.    Objective:   Blood pressure 124/82, pulse (!) 102, temperature 97.9 F (36.6 C), height 5\' 6"  (1.676 m), weight 211 lb 2 oz (95.8 kg), SpO2 97%. Body mass index is 34.08 kg/m.    Physical Exam Vitals reviewed.  Constitutional:      Appearance: She is well-developed.     Comments: Smiling. Pleasant.   HENT:     Head: Normocephalic and atraumatic.     Right Ear: Tympanic membrane, ear canal and external ear normal.     Left Ear: Tympanic membrane, ear canal and external ear normal.     Ears:     Comments: Hearing aids in place.    Nose: No nasal deformity, septal deviation, mucosal edema or rhinorrhea.     Right Turbinates: Enlarged, swollen and pale.     Left Turbinates: Enlarged, swollen and pale.     Right Sinus: No maxillary sinus tenderness or frontal sinus tenderness.     Left Sinus: No maxillary sinus tenderness or frontal sinus tenderness.     Comments: No polyps present.     Mouth/Throat:     Lips: Pink.     Mouth: Mucous membranes are moist. Mucous membranes are not pale and not dry.     Pharynx: Uvula midline.     Tonsils: No tonsillar exudate or tonsillar abscesses. 2+ on the right. 2+ on the left.  Eyes:     General: Lids are normal. Allergic shiner present.        Right eye: No discharge.        Left eye: No discharge.     Conjunctiva/sclera: Conjunctivae normal.     Right eye: Right conjunctiva is not injected. No chemosis.    Left eye: Left conjunctiva is not injected. No chemosis.    Pupils: Pupils are equal, round, and reactive to light.  Cardiovascular:     Rate and Rhythm: Normal rate and regular rhythm.     Heart sounds:  Normal heart sounds.  Pulmonary:     Effort: Pulmonary effort is normal. No tachypnea, accessory muscle usage or respiratory distress.     Breath sounds: Normal breath sounds. No wheezing, rhonchi or rales.     Comments: Moving air well in all lung fields.  No increased work of breathing. Chest:     Chest wall: No tenderness.  Lymphadenopathy:     Cervical: No cervical adenopathy.  Skin:    General: Skin is warm.     Capillary Refill: Capillary refill takes less than 2 seconds.     Coloration: Skin is not pale.     Findings: No abrasion, erythema, petechiae or rash. Rash is not papular, urticarial or vesicular.  Neurological:     Mental Status: She is alert.  Psychiatric:  Behavior: Behavior is cooperative.      Diagnostic studies: none       Malachi Bonds, MD  [facility]

## 2023-05-13 NOTE — Patient Instructions (Addendum)
 1. Perennial and seasonal allergic rhinitis (molds, cat, dog, dust mite) - Continue Periactin 4mg  at night. - Continue azelastine 2 sprays each nostril once a day as needed  2. GERD - Continue with Prilosec once daily. - 90 day supply with refills sent in.   3. Return in about 1 year (around 05/12/2024). You can have the follow up appointment with Dr. Dellis Anes or a Nurse Practicioner (our Nurse Practitioners are excellent and always have Physician oversight!).    Please inform us of any Emergency Department visits, hospitalizations, or changes in symptoms. Call us before going to the ED for breathing or allergy symptoms since we might be able to fit you in for a sick visit. Feel free to contact us anytime with any questions, problems, or concerns.  It was a pleasure to see you again today!  Websites that have reliable patient information: 1. American Academy of Asthma, Allergy, and Immunology: www.aaaai.org 2. Food Allergy Research and Education (FARE): foodallergy.org 3. Mothers of Asthmatics: http://www.asthmacommunitynetwork.org 4. American College of Allergy, Asthma, and Immunology: www.acaai.org      "Like" [place] on Facebook and Instagram for our latest updates!      A healthy democracy works best when [employer] participate! Make sure you are registered to vote! If you have moved or changed any of your contact information, you will need to get this updated before voting! Scan the QR codes below to learn more!

## 2023-07-27 ENCOUNTER — Other Ambulatory Visit: Payer: Self-pay | Admitting: Neurology

## 2023-08-03 ENCOUNTER — Other Ambulatory Visit: Payer: Self-pay | Admitting: Dermatology

## 2023-08-23 ENCOUNTER — Other Ambulatory Visit: Payer: Self-pay | Admitting: Dermatology

## 2023-09-26 ENCOUNTER — Ambulatory Visit: Payer: Medicare Other | Admitting: Dermatology

## 2023-09-26 VITALS — BP 124/76 | HR 101

## 2023-09-26 DIAGNOSIS — L649 Androgenic alopecia, unspecified: Secondary | ICD-10-CM | POA: Diagnosis not present

## 2023-09-26 DIAGNOSIS — Z79899 Other long term (current) drug therapy: Secondary | ICD-10-CM

## 2023-09-26 MED ORDER — MINOXIDIL 2.5 MG PO TABS: 2.5000 mg | ORAL_TABLET | Freq: Every day | ORAL | 3 refills | Status: AC

## 2023-09-26 MED ORDER — FINASTERIDE 5 MG PO TABS: 5.0000 mg | ORAL_TABLET | Freq: Every day | ORAL | 3 refills | Status: AC

## 2023-09-26 NOTE — Progress Notes (Signed)
 Follow-Up Visit   Subjective  Jordan Mahoney is a 73 y.o. female who presents for the following: Androgenic alopecia follow up. Patient has not noticed any improvement in hair loss or regrowth. Taking Minoxidil  2.5 mg 1 tablet daily and Finasteride  5 mg daily as directed. Patient denied side effects with medications.    The following portions of the chart were reviewed this encounter and updated as appropriate: medications, allergies, medical history  Review of Systems:  No other skin or systemic complaints except as noted in HPI or Assessment and Plan.  Objective  Well appearing patient in no apparent distress; mood and affect are within normal limits.   A focused examination was performed of the following areas: Scalp  Relevant exam findings are noted in the Assessment and Plan.                   Assessment & Plan   ANDROGENETIC ALOPECIA (FEMALE PATTERN HAIR LOSS) Exam: Diffuse thinning of the crown and widening of the midline part with retention of the frontal hairline- stable if not slightly improved when compared to baseline photos.  Increase regrowth noted at BL temporal scalp, new photos today.  Chronic and persistent condition with duration or expected duration over one year. Condition is improving with treatment but not currently at goal.    Female Androgenic Alopecia is a chronic condition related to genetics and/or hormonal changes.  In women androgenetic alopecia is commonly associated with menopause but may occur any time after puberty.  It causes hair thinning primarily on the crown with widening of the part and temporal hairline recession.  Can use OTC Rogaine  (minoxidil ) 5% solution/foam as directed.  Oral treatments in female patients who have no contraindication may include : - Low dose oral minoxidil  1.25 - 5mg  daily - Spironolactone 50 - 100mg  bid - Finasteride  2.5 - 5 mg daily Adjunctive therapies include: - Low Level Laser Light Therapy  (LLLT) - Platelet-rich plasma injections (PRP) - Hair Transplants or scalp reduction    Treatment Plan: Start Minoxidil  2.5mg  1 tab po every day dsp #90 3Rf. Continue Finasteride  5mg  1 po every day dsp #90 3 Rf.  - BP: 124/76  Ferritin reviewed from 05/03/2023 - 501   Doses of minoxidil  for hair loss are considered 'low dose'. This is because the doses used for hair loss are much lower than the doses which are used for conditions such as high blood pressure (hypertension). The doses used for hypertension are 10-40mg  per day.  Side effects are uncommon at the low doses (up to 2.5 mg/day) used to treat hair loss. Potential side effects, more commonly seen at higher doses, include: Increase in hair growth (hypertrichosis) elsewhere on face and body Temporary hair shedding upon starting medication which may last up to 4 weeks Ankle swelling, fluid retention, rapid weight gain more than 5 pounds Low blood pressure and feeling lightheaded or dizzy when standing up quickly Fast or irregular heartbeat Headaches    Long term medication management.  Patient is using long term (months to years) prescription medication  to control their dermatologic condition.  These medications require periodic monitoring to evaluate for efficacy and side effects and may require periodic laboratory monitoring.    ANDROGENIC ALOPECIA   Related Medications minoxidil  (LONITEN ) 2.5 MG tablet Take 1 tablet (2.5 mg total) by mouth daily. finasteride  (PROSCAR ) 5 MG tablet Take 1 tablet (5 mg total) by mouth daily.  Return in about 1 year (around 09/25/2024) for ANDROGENETIC ALOPECIA.  I, Emerick Ege, CMA am acting as scribe for Rexene Rattler, MD.    Documentation: I have reviewed the above documentation for accuracy and completeness, and I agree with the above.  Rexene Rattler, MD

## 2023-09-26 NOTE — Patient Instructions (Signed)

## 2023-12-20 ENCOUNTER — Encounter: Payer: Self-pay | Admitting: Neurology

## 2023-12-20 ENCOUNTER — Ambulatory Visit: Admitting: Neurology

## 2023-12-20 VITALS — BP 115/74 | HR 70 | Ht 66.0 in | Wt 192.5 lb

## 2023-12-20 DIAGNOSIS — G43019 Migraine without aura, intractable, without status migrainosus: Secondary | ICD-10-CM | POA: Diagnosis not present

## 2023-12-20 DIAGNOSIS — R202 Paresthesia of skin: Secondary | ICD-10-CM

## 2023-12-20 MED ORDER — ELETRIPTAN HYDROBROMIDE 40 MG PO TABS: 40.0000 mg | ORAL_TABLET | ORAL | 3 refills | Status: AC | PRN

## 2023-12-20 MED ORDER — GABAPENTIN 100 MG PO CAPS: 200.0000 mg | ORAL_CAPSULE | Freq: Two times a day (BID) | ORAL | 3 refills | Status: AC

## 2023-12-20 MED ORDER — EMGALITY 120 MG/ML ~~LOC~~ SOAJ: 120.0000 mg | SUBCUTANEOUS | 3 refills | Status: AC

## 2023-12-20 NOTE — Progress Notes (Signed)
 PATIENT: Jordan Mahoney DOB: Dec 18, 1950  REASON FOR VISIT: follow up for migraine HISTORY FROM: patient PRIMARY NEUROLOGIST: Dr. Onita  HISTORY Jordan Mahoney is a 73 year old female, seen in request by her primary care nurse practitioner Myra Geni ORN for evaluation of low back pain, numbness tingling in feet    I reviewed and summarized the referring note.  Past medical history Chronic migraine headaches, Neck fusion in 2019, for bilateral upper extremity paresthesia and weakness, she also had mild gait abnormality, surgery did help her symptoms, but she still has residual bilateral hands paresthesia.  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   She is taking methotrexate  for recurrent uveitis, is under good control, also reported a history of   She also has a long history of chronic migraine headaches, taking frequent Relpax  as needed, which has helped her symptoms.   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  Realized, her Air Adjusted Bed was too firm, lowered the pressure 3 weeks ago symptoms are greatly improving.  No falls,  balance is stable.  Migraines: Doing well with Ajovy , continues to have headache with barometric pressure change, but is much less intense.  Has been weaning down Topamax  taking 100 mg twice daily every other day, wants to be off.  Reserves Relpax  for only severe headaches, if she has to go out.   On gabapentin  400 mg twice a day, for paresthesia to hands post cervical fusion surgery in May 2019 with Dr. Gaither.  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  Here today with her husband.  Labs at last visit showed elevated A1c 6.0, has been eating more candy.  TSH was elevated, endocrinologist has initiated Synthroid .  Update Jun 23, 2020 SS: Here today with her husband, has recently been diagnosed with Hashimoto's, is on Synthroid .  Migraines have been well controlled with Ajovy  injection, tolerating well.  No headaches, up until this weekend, with weather change and storms.  Relpax  works well, within 20 to 45 minutes.  Is off Topamax .  Remains on gabapentin  400 mg twice daily.  Helps with paresthesia in the hands.  Paresthesia to lower extremities has resolved with sleep number bed adjustment.   Update Jun 23, 2021 SS: Remains on gabapentin  400 mg twice daily, occasionally tingling in both hands, no longer in legs. On Ajovy , still benefiting,triggers with weather change, right now often headaches because weather change, but intensity is less, doesn't need Relpax  as often, usually has 6 tablets a month left. Never has to repeat dose of Relpax . In July 2022, had right upper lung lobe removed for lung cancer. Here with husband. Has vertigo doing vestibular rehab, waiting for referral for more visits, didn't continue home exercises  Update Jul 01, 2022 SS:  Still on Ajovy . This month is a bad month with weather change, season change, she doesn't count them. Uses Relpax , works very well, headaches depend on weather pressure system. Only gets 10 a month, doesn't take all monthly. Takes gabapentin  for upper extremity  numbness, tingling.  Update December 22, 2022 SS: Emgality  working great, no overt migraines. Headaches never turn into migraine, take Relpax  twice a month if feels migraine coming on. Remains on gabapentin  for tingling in finger tips, stable since c-spine surgery. Wonders about lowering the dose? 1 fall in her laundry room tripped, bruising to left face, otherwise getting around well.   Update 12/20/23 SS: remains on Emgality , has 3 migraines a week, they are quick, don't linger, often doesn't even need the Relpax , no more than 3 times a month, works well. Able to reduce gabapentin  300 mg twice daily, able to reduce from 400 mg BID. Continues with paresthesia to finger tips. Has been diagnosed with fatty liver.    REVIEW OF SYSTEMS: Out of a complete 14 system review of symptoms, the patient complains only of the following symptoms, and all other reviewed systems are negative.  See HPI  ALLERGIES: Allergies  Allergen Reactions   Diazepam Anaphylaxis    Violent dreams  Other Reaction(s): Not available   Latex Hives and Rash    Other Reaction(s): Not available   Molds & Smuts Anaphylaxis   Other Itching, Rash, Hives and Anaphylaxis    Metal contact allergy  Blister   Sulfa Antibiotics Other (See Comments) and Anaphylaxis    Causes skin reaction ( on her mid back)  Pigmentation.   A fixed drug reaction  Other Reaction(s): Not available   Tape Other (See Comments)    Blister when pulled of or stays on too long    HOME MEDICATIONS: Outpatient Medications Prior to Visit  Medication Sig Dispense Refill   Ascorbic Acid  (VITAMIN C  PO) Take 1 mg by mouth 2 (two) times daily.     azelastine  (ASTELIN ) 0.1 % nasal spray Place 2 sprays into both nostrils daily. 90 mL 3   CALCIUM  CITRATE PO Take 1,000 mg by mouth at bedtime. Bariatric Advantage     Cholecalciferol  (VITAMIN D3) 125 MCG (5000 UT) CAPS Take 5,000 Units by mouth daily.     Cyanocobalamin (VITAMIN B12) 1000 MCG TBCR Take 1,000  mcg by mouth in the morning.     cyproheptadine  (PERIACTIN ) 4 MG tablet Take 1 tablet (4 mg total) by mouth at bedtime as needed for allergies. 90 tablet 3   dorzolamide -timolol  (COSOPT ) 22.3-6.8 MG/ML ophthalmic solution Place 1 drop into the right eye 2 (two) times daily.     ferrous sulfate 324 MG TBEC Take 324 mg by mouth.     finasteride  (PROSCAR ) 5 MG tablet Take 1 tablet (5 mg total) by mouth daily. 90 tablet 3   folic acid  (FOLVITE ) 1 MG tablet Take 1 mg by mouth in the morning.     Iron  Combinations (CHROMAGEN) capsule Take 1 capsule by mouth daily.     Ivermectin  (SOOLANTRA ) 1 % CREA Apply 1 Application topically at bedtime. Qhs to face for rosacea 135 g 4   Krill Oil 350 MG CAPS Take 350 mg by mouth daily.     levothyroxine  (SYNTHROID ) 75 MCG tablet Take 1 tablet (75 mcg total) by mouth daily before breakfast. 90 tablet 1   Magnesium  Oxide 400 MG CAPS Take 400 mg by mouth in the morning and at bedtime.     meclizine (ANTIVERT) 25  MG tablet Take by mouth.     minoxidil  (LONITEN ) 2.5 MG tablet Take 1 tablet (2.5 mg total) by mouth daily. 90 tablet 3   Multiple Vitamin (MULTIVITAMIN) tablet Take 1 tablet by mouth 2 (two) times daily. Bariatric Advantage     omeprazole  (PRILOSEC) 20 MG capsule Take 1 capsule (20 mg total) by mouth daily. 90 capsule 3   pimecrolimus  (ELIDEL ) 1 % cream Apply topically as directed. Qd to bid aa right shoulder until resolved 90 g 1   prednisoLONE  acetate (PRED FORTE ) 1 % ophthalmic suspension Place 1 drop into the right eye daily.     Probiotic Product (PROBIOTIC DAILY PO) Take by mouth.     Resmetirom (REZDIFFRA) 80 MG TABS Take 80 mg by mouth daily.     riboflavin  (VITAMIN B-2) 100 MG TABS tablet Take 100 mg by mouth in the morning and at bedtime.     solifenacin  (VESICARE ) 10 MG tablet Take 10 mg by mouth in the morning.     eletriptan  (RELPAX ) 40 MG tablet Take 1 tablet (40 mg total) by mouth every 2 (two) hours as needed for migraine. 40 mg on set May  repeat in 2 hours if headache persists or recurs. Not to exceed 2 tabs/24hours. 30 tablet 3   gabapentin  (NEURONTIN ) 300 MG capsule TAKE 1 CAPSULE TWICE A DAY 180 capsule 3   Galcanezumab -gnlm (EMGALITY ) 120 MG/ML SOAJ Inject 120 mg into the skin every 30 (thirty) days. 1 mL 11   No facility-administered medications prior to visit.    PAST MEDICAL HISTORY: Past Medical History:  Diagnosis Date   Allergic rhinoconjunctivitis    Allergies    Arthritis    Back pain    Chronic uveitis    Depression    Fatty liver    Frequent UTI    GERD (gastroesophageal reflux disease)    HA (headache)    Hearing loss    wearing bilateral aides   History of hiatal hernia    Hypothyroidism    Lactose intolerance    Migraine    Osteopenia    Pre-diabetes    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HCC)    Sensorineural hearing loss    Tingling of both feet    Tinnitus     PAST SURGICAL HISTORY: Past Surgical History:  Procedure Laterality Date   ABDOMINAL HYSTERECTOMY     belly button     belly button hernia   BRONCHIAL BIOPSY  08/05/2020   Procedure: BRONCHIAL BIOPSIES;  Surgeon: Brenna Adine CROME, DO;  Location: [facility];  Service: Pulmonary;;   BRONCHIAL BRUSHINGS  08/05/2020   Procedure: BRONCHIAL BRUSHINGS;  Surgeon: Brenna Adine CROME, DO;  Location: [facility];  Service: Pulmonary;;   BRONCHIAL NEEDLE ASPIRATION BIOPSY  08/05/2020   Procedure: BRONCHIAL NEEDLE ASPIRATION BIOPSIES;  Surgeon: Brenna Adine CROME, DO;  Location: [facility];  Service: Pulmonary;;   BRONCHIAL WASHINGS  08/05/2020   Procedure: BRONCHIAL WASHINGS;  Surgeon: Brenna Adine CROME, DO;  Location: [facility];  Service: Pulmonary;;   c sections     CATARACT EXTRACTION Right    CHOLECYSTECTOMY     COLON RESECTION N/A 04/17/2018   Procedure: LAPAROSCOPIC COLON RESECTION POSSIBLE OSTOMY;  Surgeon: Tye Millet, DO;  Location: [facility];  Service: General;  Laterality: N/A;   COLONOSCOPY  10/13/2021   COLONOSCOPY WITH PROPOFOL  N/A  07/23/2020   Procedure: COLONOSCOPY WITH PROPOFOL ;  Surgeon: Toledo, Ladell POUR, MD;  Location: [facility];  Service: Gastroenterology;  Laterality: N/A;   DIAGNOSTIC LAPAROSCOPY  ELBOW SURGERY Left    EYE SURGERY Right    glaucoma and stent surgery   FIDUCIAL MARKER PLACEMENT  08/05/2020   Procedure: FIDUCIAL MARKER PLACEMENT;  Surgeon: Brenna Adine CROME, DO;  Location: [facility];  Service: Pulmonary;;   GANGLION CYST EXCISION Left 01/22/2016   base of thumb   GASTRIC BYPASS     GLAUCOMA SURGERY     GLAUCOMA SURGERY  12/13/2019   right eye   HERNIA REPAIR     INTERCOSTAL NERVE BLOCK Right 09/01/2020   Procedure: INTERCOSTAL NERVE BLOCK;  Surgeon: Shyrl Linnie KIDD, MD;  Location: [facility];  Service: Thoracic;  Laterality: Right;   LUNG REMOVAL, PARTIAL     R upper lobe   neck fusion  07/04/2017   Dr. Gaither in [place]   NODE DISSECTION Right 09/01/2020   Procedure: NODE DISSECTION;  Surgeon: Shyrl Linnie KIDD, MD;  Location: [facility];  Service: Thoracic;  Laterality: Right;   SPINE SURGERY     VIDEO BRONCHOSCOPY WITH ENDOBRONCHIAL NAVIGATION Right 08/05/2020   Procedure: VIDEO BRONCHOSCOPY WITH ENDOBRONCHIAL NAVIGATION;  Surgeon: Brenna Adine CROME, DO;  Location: [facility];  Service: Pulmonary;  Laterality: Right;   WRIST SURGERY Right     FAMILY HISTORY: Family History  Problem Relation Age of Onset   Diabetes Father    Stroke Father    Squamous cell carcinoma Mother    Migraines Daughter    Breast cancer Paternal Aunt    Allergic rhinitis Neg Hx    Angioedema Neg Hx    Asthma Neg Hx    Eczema Neg Hx    Immunodeficiency Neg Hx    Urticaria Neg Hx     SOCIAL HISTORY: Social History   Socioeconomic History   Marital status: Married    Spouse name: Not on file   Number of children: 2   Years of education: college   Highest education level: Not on file  Occupational History    Comment: retired  Tobacco Use   Smoking status: Former    Current packs/day: 0.00     Average packs/day: 0.5 packs/day for 10.0 years (5.0 ttl pk-yrs)    Types: Cigarettes    Start date: 89    Quit date: 1982    Years since quitting: 43.8   Smokeless tobacco: Never  Vaping Use   Vaping status: Never Used  Substance and Sexual Activity   Alcohol use: Not Currently    Alcohol/week: 1.0 standard drink of alcohol    Types: 1 Cans of beer per week   Drug use: No   Sexual activity: Not on file  Other Topics Concern   Not on file  Social History Narrative   Patient lives at home with her husband Debi).    Education two years of college.   Caffeine - two glasses of tea.   Social Drivers of Corporate Investment Banker Strain: Not on file  Food Insecurity: Not on file  Transportation Needs: Not on file  Physical Activity: Not on file  Stress: Not on file  Social Connections: Not on file  Intimate Partner Violence: Not on file   PHYSICAL EXAM  Vitals:   12/20/23 1527  BP: 115/74  Pulse: 70  SpO2: 97%  Weight: 192 lb 8 oz (87.3 kg)  Height: 5' 6 (1.676 m)   Body mass index is 31.07 kg/m.  Generalized: Well developed, in no acute distress   Neurological examination  Mentation: Alert oriented to time, place, history taking. Follows all commands  speech and language fluent Cranial nerve II-XII: Right pupil 4 mm, left 3 mm, right is post surgery. Extraocular movements were full, visual field were full on confrontational test. Facial sensation and strength were normal. Head turning and shoulder shrug  were normal and symmetric. Motor: The motor testing reveals 5 over 5 strength of all 4 extremities. Good symmetric motor tone is noted throughout.  Sensory: Sensory testing is intact to soft touch on all 4 extremities. No evidence of extinction is noted.  Coordination: Cerebellar testing reveals good finger-nose-finger and heel-to-shin bilaterally.  Gait and station: Gait is slightly wide-based, cautious, but independent Reflexes: Normal  throughout  DIAGNOSTIC DATA (LABS, IMAGING, TESTING) - I reviewed patient records, labs, notes, testing and imaging myself where available.  Lab Results  Component Value Date   WBC 7.9 05/03/2023   HGB 14.1 05/03/2023   HCT 43.4 05/03/2023   MCV 95.2 05/03/2023   PLT 238 05/03/2023      Component Value Date/Time   NA 136 05/03/2023 0802   K 4.0 05/03/2023 0802   CL 101 05/03/2023 0802   CO2 24 05/03/2023 0802   GLUCOSE 178 (H) 05/03/2023 0802   BUN 14 05/03/2023 0802   CREATININE 0.88 05/03/2023 0802   CALCIUM  8.7 (L) 05/03/2023 0802   PROT 6.9 05/03/2023 0802   PROT 6.8 11/15/2019 1048   ALBUMIN 4.0 05/03/2023 0802   AST 55 (H) 05/03/2023 0802   ALT 72 (H) 05/03/2023 0802   ALKPHOS 99 05/03/2023 0802   BILITOT 1.1 05/03/2023 0802   GFRNONAA >60 05/03/2023 0802   GFRAA >60 10/29/2019 1238   Lab Results  Component Value Date   CHOL 197 11/16/2021   HDL 51 11/16/2021   LDLCALC 115 (H) 11/16/2021   TRIG 178 (H) 11/16/2021   CHOLHDL 3.9 11/16/2021   Lab Results  Component Value Date   HGBA1C 7.0 11/20/2021   Lab Results  Component Value Date   VITAMINB12 >2000 (H) 11/15/2019   Lab Results  Component Value Date   TSH 1.610 11/16/2021   ASSESSMENT AND PLAN 73 y.o. year old female  1.  New onset bilateral feet paresthesia 2.  History of cervical decompression surgery 3.  History of sigmoid colon resection in March 2020 4.  History right upper lung lobectomy July 2022 -NCV/EMG in October 2021 was normal of LLE -CEA, CBC, CMP, B12, RPR, folate, CRP, CK, ESR, ANA, MM panel, Vit D, were unremarkable, A1c was elevated 6.0, TSH elevated 5.940 -Able to reduce gabapentin  from 400 mg BID down to 300 mg BID, will further reduce down to 200 mg BID  5.  Chronic migraine headaches - Doing well with Emgality , continue Emgality  120 mg monthly injection for migraine prevention - Continue Relpax  as needed for severe headache - Previously tried and failed: Ajovy , Aimovig   contraindicated due to allergy to latex, Topamax  - Next steps: Qulipta, Nurtec, is not interested in Botox - Follow-up in 1 year  Lauraine Gayland MANDES, DNP 12/20/2023, 3:52 PM [facility] [address], [address] [place], [place] [address] [phone]

## 2023-12-20 NOTE — Patient Instructions (Signed)
 Reduce dose of gabapentin  to 200 mg twice a day Continue Emgality  for migraine prevention, continue Relpax  as needed.  Call for worsening symptoms.  Follow-up in 1 year.  Thanks

## 2023-12-21 ENCOUNTER — Telehealth: Payer: Medicare Other | Admitting: Neurology

## 2024-05-08 ENCOUNTER — Other Ambulatory Visit

## 2024-05-10 ENCOUNTER — Other Ambulatory Visit

## 2024-05-11 ENCOUNTER — Ambulatory Visit: Admitting: Allergy & Immunology

## 2024-05-11 ENCOUNTER — Ambulatory Visit: Admitting: Family Medicine

## 2024-05-15 ENCOUNTER — Ambulatory Visit: Admitting: Oncology

## 2024-05-17 ENCOUNTER — Ambulatory Visit: Admitting: Oncology

## 2024-09-25 ENCOUNTER — Ambulatory Visit: Admitting: Dermatology

## 2024-12-25 ENCOUNTER — Ambulatory Visit: Admitting: Neurology
